# Patient Record
Sex: Female | Born: 1955 | Race: Black or African American | Hispanic: No | Marital: Married | Smoking: Former smoker
Health system: Southern US, Community
[De-identification: ages and names within clinical notes are randomized; demographics above are authoritative.]

## PROBLEM LIST (undated history)

## (undated) DIAGNOSIS — E785 Hyperlipidemia, unspecified: Secondary | ICD-10-CM

## (undated) DIAGNOSIS — D510 Vitamin B12 deficiency anemia due to intrinsic factor deficiency: Secondary | ICD-10-CM

## (undated) DIAGNOSIS — Z992 Dependence on renal dialysis: Secondary | ICD-10-CM

## (undated) DIAGNOSIS — N289 Disorder of kidney and ureter, unspecified: Secondary | ICD-10-CM

## (undated) DIAGNOSIS — E049 Nontoxic goiter, unspecified: Secondary | ICD-10-CM

## (undated) DIAGNOSIS — M8580 Other specified disorders of bone density and structure, unspecified site: Secondary | ICD-10-CM

## (undated) DIAGNOSIS — D126 Benign neoplasm of colon, unspecified: Secondary | ICD-10-CM

## (undated) DIAGNOSIS — M109 Gout, unspecified: Secondary | ICD-10-CM

## (undated) DIAGNOSIS — I10 Essential (primary) hypertension: Secondary | ICD-10-CM

## (undated) DIAGNOSIS — T8612 Kidney transplant failure: Secondary | ICD-10-CM

## (undated) DIAGNOSIS — D649 Anemia, unspecified: Secondary | ICD-10-CM

## (undated) DIAGNOSIS — N186 End stage renal disease: Secondary | ICD-10-CM

## (undated) DIAGNOSIS — J189 Pneumonia, unspecified organism: Secondary | ICD-10-CM

## (undated) DIAGNOSIS — J302 Other seasonal allergic rhinitis: Secondary | ICD-10-CM

## (undated) DIAGNOSIS — E119 Type 2 diabetes mellitus without complications: Secondary | ICD-10-CM

## (undated) DIAGNOSIS — E0590 Thyrotoxicosis, unspecified without thyrotoxic crisis or storm: Secondary | ICD-10-CM

## (undated) HISTORY — DX: Essential (primary) hypertension: I10

## (undated) HISTORY — DX: End stage renal disease: N18.6

## (undated) HISTORY — DX: Vitamin B12 deficiency anemia due to intrinsic factor deficiency: D51.0

## (undated) HISTORY — DX: Thyrotoxicosis, unspecified without thyrotoxic crisis or storm: E05.90

## (undated) HISTORY — DX: Benign neoplasm of colon, unspecified: D12.6

## (undated) HISTORY — DX: Nontoxic goiter, unspecified: E04.9

## (undated) HISTORY — DX: Other specified disorders of bone density and structure, unspecified site: M85.80

## (undated) HISTORY — DX: Hyperlipidemia, unspecified: E78.5

## (undated) HISTORY — PX: SHOULDER ARTHROSCOPY W/ ROTATOR CUFF REPAIR: SHX2400

## (undated) HISTORY — DX: Other seasonal allergic rhinitis: J30.2

## (undated) HISTORY — DX: Disorder of kidney and ureter, unspecified: N28.9

## (undated) HISTORY — PX: UPPER GI ENDOSCOPY: SHX6162

## (undated) HISTORY — DX: Gout, unspecified: M10.9

## (undated) HISTORY — DX: Type 2 diabetes mellitus without complications: E11.9

---

## 1992-06-23 HISTORY — PX: GANGLION CYST EXCISION: SHX1691

## 1998-06-27 ENCOUNTER — Encounter: Payer: Self-pay | Admitting: *Deleted

## 1998-06-27 ENCOUNTER — Ambulatory Visit (HOSPITAL_COMMUNITY): Admission: RE | Admit: 1998-06-27 | Discharge: 1998-06-27 | Payer: Self-pay | Admitting: *Deleted

## 1998-09-13 ENCOUNTER — Encounter: Payer: Self-pay | Admitting: *Deleted

## 1998-09-13 ENCOUNTER — Ambulatory Visit (HOSPITAL_COMMUNITY): Admission: RE | Admit: 1998-09-13 | Discharge: 1998-09-13 | Payer: Self-pay | Admitting: *Deleted

## 1999-10-23 ENCOUNTER — Ambulatory Visit (HOSPITAL_COMMUNITY): Admission: RE | Admit: 1999-10-23 | Discharge: 1999-10-23 | Payer: Self-pay | Admitting: *Deleted

## 1999-10-23 ENCOUNTER — Encounter: Payer: Self-pay | Admitting: *Deleted

## 1999-12-19 ENCOUNTER — Other Ambulatory Visit: Admission: RE | Admit: 1999-12-19 | Discharge: 1999-12-19 | Payer: Self-pay | Admitting: Obstetrics & Gynecology

## 2000-11-09 ENCOUNTER — Ambulatory Visit (HOSPITAL_COMMUNITY): Admission: RE | Admit: 2000-11-09 | Discharge: 2000-11-09 | Payer: Self-pay | Admitting: *Deleted

## 2000-11-09 ENCOUNTER — Encounter: Payer: Self-pay | Admitting: *Deleted

## 2001-01-13 ENCOUNTER — Other Ambulatory Visit: Admission: RE | Admit: 2001-01-13 | Discharge: 2001-01-13 | Payer: Self-pay | Admitting: Obstetrics and Gynecology

## 2001-10-20 ENCOUNTER — Encounter: Payer: Self-pay | Admitting: *Deleted

## 2001-10-20 ENCOUNTER — Ambulatory Visit (HOSPITAL_COMMUNITY): Admission: RE | Admit: 2001-10-20 | Discharge: 2001-10-20 | Payer: Self-pay | Admitting: *Deleted

## 2002-01-12 ENCOUNTER — Encounter: Payer: Self-pay | Admitting: *Deleted

## 2002-01-12 ENCOUNTER — Ambulatory Visit (HOSPITAL_COMMUNITY): Admission: RE | Admit: 2002-01-12 | Discharge: 2002-01-12 | Payer: Self-pay | Admitting: *Deleted

## 2002-01-27 ENCOUNTER — Other Ambulatory Visit: Admission: RE | Admit: 2002-01-27 | Discharge: 2002-01-27 | Payer: Self-pay | Admitting: Obstetrics and Gynecology

## 2002-02-02 ENCOUNTER — Ambulatory Visit (HOSPITAL_COMMUNITY): Admission: RE | Admit: 2002-02-02 | Discharge: 2002-02-02 | Payer: Self-pay | Admitting: Obstetrics and Gynecology

## 2002-02-02 ENCOUNTER — Encounter: Payer: Self-pay | Admitting: Obstetrics and Gynecology

## 2002-02-08 ENCOUNTER — Ambulatory Visit (HOSPITAL_COMMUNITY): Admission: RE | Admit: 2002-02-08 | Discharge: 2002-02-08 | Payer: Self-pay | Admitting: Nephrology

## 2002-02-10 ENCOUNTER — Encounter (INDEPENDENT_AMBULATORY_CARE_PROVIDER_SITE_OTHER): Payer: Self-pay | Admitting: *Deleted

## 2002-02-10 ENCOUNTER — Ambulatory Visit (HOSPITAL_COMMUNITY): Admission: RE | Admit: 2002-02-10 | Discharge: 2002-02-11 | Payer: Self-pay | Admitting: Nephrology

## 2002-02-10 ENCOUNTER — Encounter: Payer: Self-pay | Admitting: Nephrology

## 2002-09-26 ENCOUNTER — Ambulatory Visit (HOSPITAL_BASED_OUTPATIENT_CLINIC_OR_DEPARTMENT_OTHER): Admission: RE | Admit: 2002-09-26 | Discharge: 2002-09-26 | Payer: Self-pay | Admitting: Orthopedic Surgery

## 2003-01-31 ENCOUNTER — Other Ambulatory Visit: Admission: RE | Admit: 2003-01-31 | Discharge: 2003-01-31 | Payer: Self-pay | Admitting: Obstetrics and Gynecology

## 2003-02-06 ENCOUNTER — Encounter: Payer: Self-pay | Admitting: Obstetrics and Gynecology

## 2003-02-06 ENCOUNTER — Ambulatory Visit (HOSPITAL_COMMUNITY): Admission: RE | Admit: 2003-02-06 | Discharge: 2003-02-06 | Payer: Self-pay | Admitting: Obstetrics and Gynecology

## 2004-03-11 ENCOUNTER — Other Ambulatory Visit: Admission: RE | Admit: 2004-03-11 | Discharge: 2004-03-11 | Payer: Self-pay | Admitting: Obstetrics and Gynecology

## 2004-03-28 ENCOUNTER — Ambulatory Visit (HOSPITAL_COMMUNITY): Admission: RE | Admit: 2004-03-28 | Discharge: 2004-03-28 | Payer: Self-pay | Admitting: Obstetrics and Gynecology

## 2004-09-12 ENCOUNTER — Encounter: Admission: RE | Admit: 2004-09-12 | Discharge: 2004-09-12 | Payer: Self-pay | Admitting: Internal Medicine

## 2005-05-08 ENCOUNTER — Ambulatory Visit (HOSPITAL_COMMUNITY): Admission: RE | Admit: 2005-05-08 | Discharge: 2005-05-08 | Payer: Self-pay | Admitting: Internal Medicine

## 2005-06-19 ENCOUNTER — Ambulatory Visit (HOSPITAL_COMMUNITY): Admission: RE | Admit: 2005-06-19 | Discharge: 2005-06-19 | Payer: Self-pay | Admitting: Internal Medicine

## 2005-07-08 ENCOUNTER — Encounter (INDEPENDENT_AMBULATORY_CARE_PROVIDER_SITE_OTHER): Payer: Self-pay | Admitting: *Deleted

## 2005-07-08 ENCOUNTER — Encounter: Admission: RE | Admit: 2005-07-08 | Discharge: 2005-07-08 | Payer: Self-pay | Admitting: Internal Medicine

## 2005-07-08 ENCOUNTER — Other Ambulatory Visit: Admission: RE | Admit: 2005-07-08 | Discharge: 2005-07-08 | Payer: Self-pay | Admitting: Interventional Radiology

## 2005-10-29 ENCOUNTER — Encounter: Admission: RE | Admit: 2005-10-29 | Discharge: 2005-10-29 | Payer: Self-pay | Admitting: Internal Medicine

## 2005-12-19 ENCOUNTER — Encounter: Admission: RE | Admit: 2005-12-19 | Discharge: 2005-12-19 | Payer: Self-pay | Admitting: Internal Medicine

## 2006-03-24 ENCOUNTER — Ambulatory Visit: Payer: Self-pay | Admitting: Gastroenterology

## 2006-06-03 ENCOUNTER — Inpatient Hospital Stay (HOSPITAL_COMMUNITY): Admission: AD | Admit: 2006-06-03 | Discharge: 2006-06-11 | Payer: Self-pay | Admitting: Nephrology

## 2006-06-09 ENCOUNTER — Encounter (INDEPENDENT_AMBULATORY_CARE_PROVIDER_SITE_OTHER): Payer: Self-pay | Admitting: *Deleted

## 2006-06-23 HISTORY — PX: AV FISTULA PLACEMENT: SHX1204

## 2006-08-04 ENCOUNTER — Ambulatory Visit: Payer: Self-pay | Admitting: Vascular Surgery

## 2006-08-11 ENCOUNTER — Ambulatory Visit: Payer: Self-pay | Admitting: Vascular Surgery

## 2006-08-11 ENCOUNTER — Ambulatory Visit (HOSPITAL_COMMUNITY): Admission: RE | Admit: 2006-08-11 | Discharge: 2006-08-11 | Payer: Self-pay | Admitting: Vascular Surgery

## 2006-09-03 ENCOUNTER — Ambulatory Visit: Payer: Self-pay | Admitting: *Deleted

## 2006-09-24 ENCOUNTER — Ambulatory Visit: Payer: Self-pay | Admitting: *Deleted

## 2006-11-19 ENCOUNTER — Ambulatory Visit (HOSPITAL_COMMUNITY): Admission: RE | Admit: 2006-11-19 | Discharge: 2006-11-19 | Payer: Self-pay | Admitting: Vascular Surgery

## 2006-12-22 ENCOUNTER — Ambulatory Visit (HOSPITAL_COMMUNITY): Admission: RE | Admit: 2006-12-22 | Discharge: 2006-12-22 | Payer: Self-pay | Admitting: Nephrology

## 2007-04-15 ENCOUNTER — Ambulatory Visit: Payer: Self-pay | Admitting: Dentistry

## 2007-04-15 ENCOUNTER — Encounter: Admission: AD | Admit: 2007-04-15 | Discharge: 2007-04-15 | Payer: Self-pay | Admitting: Dentistry

## 2007-08-19 ENCOUNTER — Ambulatory Visit: Payer: Self-pay | Admitting: Gastroenterology

## 2007-08-22 DIAGNOSIS — D126 Benign neoplasm of colon, unspecified: Secondary | ICD-10-CM

## 2007-08-22 HISTORY — DX: Benign neoplasm of colon, unspecified: D12.6

## 2007-09-02 ENCOUNTER — Encounter: Payer: Self-pay | Admitting: Gastroenterology

## 2007-09-02 ENCOUNTER — Ambulatory Visit (HOSPITAL_COMMUNITY): Admission: RE | Admit: 2007-09-02 | Discharge: 2007-09-02 | Payer: Self-pay | Admitting: Gastroenterology

## 2007-09-07 ENCOUNTER — Ambulatory Visit: Payer: Self-pay | Admitting: Gastroenterology

## 2007-09-21 ENCOUNTER — Encounter (INDEPENDENT_AMBULATORY_CARE_PROVIDER_SITE_OTHER): Payer: Self-pay | Admitting: Ophthalmology

## 2007-09-21 ENCOUNTER — Ambulatory Visit (HOSPITAL_BASED_OUTPATIENT_CLINIC_OR_DEPARTMENT_OTHER): Admission: RE | Admit: 2007-09-21 | Discharge: 2007-09-21 | Payer: Self-pay | Admitting: Ophthalmology

## 2008-11-21 HISTORY — PX: KIDNEY TRANSPLANT: SHX239

## 2009-07-12 DIAGNOSIS — E559 Vitamin D deficiency, unspecified: Secondary | ICD-10-CM | POA: Insufficient documentation

## 2010-01-29 ENCOUNTER — Ambulatory Visit: Payer: Self-pay | Admitting: Vascular Surgery

## 2010-07-14 ENCOUNTER — Encounter: Payer: Self-pay | Admitting: Internal Medicine

## 2010-11-05 NOTE — Assessment & Plan Note (Signed)
OFFICE VISIT   Kimberly [name], Kimberly [name]  DOB:  06/16/1956                                       01/29/2010  CHART#:[id number]   This is a patient with chronic renal insufficiency, was on hemodialysis  for 2 years with a left upper arm AV fistula.  We have seen her in the  past regarding this fistula.  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  Prior to that, she had a large area of infiltration with a  hematoma surrounding the left upper arm fistula.  The fistula has not  been utilized in over a year but she has continued to have some bluish  discoloration where the hematoma was located.  There is not really any  pain associated with this and she has no steal symptoms in the left  hand, denying any numbness or pain.  Cadaveric kidney transplant is  functioning well.   CHRONIC MEDICAL PROBLEMS:  1. Hypertension.  2. Chronic renal insufficiency status post renal transplant.  3. Negative for coronary artery disease, diabetes or stroke.  4.   SOCIAL HISTORY:  She is married, has 3 children.  Does not use tobacco,  has not in 10 years.  Does not use alcohol.   REVIEW OF SYSTEMS:  Negative for chest pain, dyspnea on exertion, no  chronic bronchitis, productive cough, asthma, wheezing.  All other  systems are negative in review of systems.   PHYSICAL EXAMINATION:  Blood pressure 112/69, heart rate 67, temperature  97.8, respirations 14.  General:  A well-developed, well-nourished  female in no apparent distress, alert and oriented x3.  Chest:  Clear to  auscultation.  Upper extremity exam reveals a left arm to have a  functioning left upper arm fistula.  There is some depigmentation  overlying the fistula.  Has an excellent pulse and palpable thrill and  is dilated to about 2.5 cm throughout.  There is some mild bruising  surrounding this in the mid to proximal upper arm but no hematoma.  She  has excellent circulation distally with a pink, well-perfused left  hand.   I think the bruising has still residual from the large hematoma which  she experienced a year ago.  There is no indication to ligate the  fistula or revise it.  If this enlarges, develops aneurysm, she will be  in touch with [place].  Otherwise continue as she has been doing unless it  becomes symptomatic.     Nelda Severe Kellie Simmering, M.D.  Electronically Signed   JDL/MEDQ  D:  01/29/2010  T:  01/30/2010  Job:  [id number]

## 2010-11-05 NOTE — Assessment & Plan Note (Signed)
[facility]                         GASTROENTEROLOGY OFFICE NOTE   Kimberly, [name]                      MRN:          [id number]  DATE:08/19/2007                            DOB:          30-May-1956    REFERRING PHYSICIAN:  Janalyn Rouse, M.D.   REASON FOR CONSULTATION:  Anemia and constipation.   HISTORY OF PRESENT ILLNESS:  Kimberly [name] is a 55 year old African-  American female with end-stage renal disease on hemodialysis on Monday,  Wednesday, Friday for approximately the past 14 months.  She has a mild  macrocytic anemia, hemoglobin 11.9, MCV 98.5.  She has occaisonal  constipation. She relates no change in bowel habits, change in stool  caliber, diarrhea, melena, hematochezia, or weight loss.  There is no  family history of colon cancer, colon polyps, or inflammatory bowel  disease.   PAST MEDICAL HISTORY:  hypertension  diabetes mellitus  hyperlipidemia  goiter  membranous glomerulonephropathy with chronic renal failure  osteopenia  gout   PAST SURGICAL HISTORY:  Status post laparoscopic left shoulder surgery   CURRENT MEDICATIONS:  Listed on the chart, updated, and reviewed.   MEDICATION ALLERGIES:  None known.   SOCIAL HISTORY AND REVIEW OF SYSTEMS:  Per the hand-written form.   PHYSICAL EXAM:  Well-developed, well-nourished in no acute distress.  Height 5 feet 6 inches, weight 189 pounds, blood pressure 100/68, pulse  72 and regular.  HEENT:  Anicteric sclerae.  Oropharynx clear.  CHEST:  Clear to auscultation bilaterally.  CARDIAC:  Regular rate and rhythm without murmurs appreciated.  ABDOMEN:  Soft, nontender, nondistended.  Normoactive bowel sounds.  No  palpable organomegaly, masses, or hernias.  RECTAL:  Deferred to time of colonoscopy.  EXTREMITIES:  Without cyanosis, clubbing, or edema.  NEUROLOGIC:  Alert and oriented x3.  Grossly nonfocal.   ASSESSMENT AND PLAN:  Mild macrocytic anemia and mild constipation.  Rule out colorectal neoplasms, arteriovenous malformations.  Risks,  benefits, and alternatives to colonoscopy with possible biopsy and  possible polypectomy discussed with the patient.  She consents to  proceed.  This will be scheduled on a Tuesday or Thursday to accommodate her  routine hemodialysis schedule.     Pricilla Riffle. Fuller Plan, MD, [facility]  Electronically Signed    MTS/MedQ  DD: 08/27/2007  DT: 08/27/2007  Job #: [id number]   cc:   Janalyn Rouse, M.D.

## 2010-11-08 NOTE — Discharge Summary (Signed)
NAMELAKEN, Kimberly [name]               ACCOUNT NO.:  [id number]   MEDICAL RECORD NO.:  [id number]          PATIENT TYPE:  INP   LOCATION:  [id number]                         FACILITY:  [facility]   PHYSICIAN:  Louis Meckel, M.D.DATE OF BIRTH:  [birth year]   DATE OF ADMISSION:  06/03/2006  DATE OF DISCHARGE:  06/11/2006                               DISCHARGE SUMMARY   DISCHARGE DIAGNOSES:  1. Uremia - new end-stage renal disease.  2. Anemia.  3. Hypertension.  4. Secondary hyperparathyroidism.  5. Uremic platelet dysfunction.  6. Steroid-induced diabetes mellitus.  7. Leukocytosis.   PROCEDURES:  1. Right IJ dialysis catheter placement on June 04, 2006 by Dr.      Annamaria Boots.  2. Hemodialysis.  3. Renal biopsy, June 09, 2006, Dr. Moshe Cipro.   HISTORY OF PRESENT ILLNESS:  Kimberly [name] is a 55 year old African  American female with a past medical history significant for hypertension  and biopsy-proven membranous glomerulosclerosis in August 2003 in  association with FSG.  The patient was followed and managed  conservatively initially until 2005, when she developed more significant  hypertension, and her creatinine increased to 1.8.  The patient is  status post courses x2 of prednisone and Cytoxan, with Cytoxan stopped  in April 2007 and prednisone stopped in August 2007. The patient  appeared to be doing well at her last office visit on April 13, 2006,  when her creatinine was in the high 2s.  On June 01, 2006, the  office received a call from the patient, vomiting over the weekend week,  with questionable blood in her urine. Her labs showed creatinine now up  to 12.  Her urine was hematuria, and her hemoglobin was 8, down from 12.  The patient was called at home, and her son said she was feeling better  today. Her blood pressure at home was 188/90.  The patient admits to  dark stools. Dr. Moshe Cipro recommended inpatient evaluation, and she  agreed. The patient was  admitted then on June 03, 2006.  In the  hospital, she was evaluated by Dr. Jamal Maes. Further review of  systems noted that she has been feeling unwell for the past two weeks,  with chills and flu-like symptoms and cough, more epigastric pain,  nausea and vomiting, and a taste in mouth, with tea-colored urine. Her  blood pressure at presentation was 165/89, she was afebrile, heart rate  107, and 99% saturation on room air.   HOSPITAL COURSE:  The patient was admitted to a medical floor.  Initial  labs drawn showed a white count of 9.8, with 81% neutrophils, 10%  lymphocytes, hemoglobin 7.9, hematocrit 22.8, platelets 250,000.  PTT  53, INR 1.1.  Sodium 139, potassium 6.1, chloride 112, CO2 13, glucose  173, BUN 85, creatinine 12.3, calcium 9.5, albumin 3.1, phosphorus 9.4.  Liver function tests within normal limits.  C3 and C4 were checked and  found to be within acceptable range.  ANA was negative, as was ANCA.  Glomerular basement membrane was 0.  Initial bleeding time done was  elevated, most likely due to uremic platelet dysfunction.  Her initial  elevated potassium was treated with Kayexalate.  After 24 hours, it was  apparent she would need to start dialysis, which she did on June 04, 2006, following placement of permanent dialysis catheter. She had serial  treatments throughout her hospitalization, gradually lowering her  weight, tapering her blood pressure medications. A repeat of her  bleeding time after several dialysis treatments showed a bleeding time  of 4, and therefore she was taken for biopsy again on June 09, 2006  to see if there is something that could be reversed. She had been  empirically put on Solu-Medrol 40 mg b.i.d. upon admission to the  hospital. Final biopsy showed membranous GN as well as scar tissue.  Therefore, it was decided that she would most likely need to be on long-  term hemodialysis, and arrangements were made for her to start at  the  [facility] this Saturday. She is very interested in  transplant workup and she has several brothers and sisters. She has a  fraternal twin brother. Her sister and another brother have diabetes,  which may prohibit them from offering a kidney root.  The patient did  receive 1 unit of packed red cells in her hospitalization which  increased her hemoglobin somewhat. It was 9.4 at discharge.  Iron  studies were checked on June 04, 2006, and she had a percent  saturation of 30.  She will be discharged on low-dose InFeD daily as  well as Epogen, having received Aranesp during her hospitalization.  Also during her hospitalization, it was noted that her white blood count  steadily increased, and she was afebrile.  This was most likely due to  her Solu-Medrol, which was changed to prednisone taper at the time of  discharge.  She will be tapered starting at 60 mg per day, but  decreasing by 10 mg every 5 days, and the taper should last  approximately 30 days.  The patient's blood sugar was more elevated  during her hospitalization.  Again, this is likely due to Solu-Medrol.  She does have an Accu-Chek machine at home.  I have instructed her on  house to follow the steroid taper and encouraged her to check her Accu-  Cheks, as her need for glipizide may change once she is off of the  prednisone.  The patient was on oral Hectorol prior to admission.  Her  PTH this admission was 324. She was also started on Renagel for her  elevated phosphorus. The patient had a problem with constipation during  her hospitalization that was corrected with sorbitol. She also received  diet teaching by the dietician just prior to discharge, assistance with  financial aid, and case management support throughout her  hospitalization. She was discharged home the afternoon of June 11, 2006.   CONDITION ON DISCHARGE:  Good.  DISCHARGE MEDICATIONS:  1. Vytorin 10/40, 1 daily.  2. Glipizide 5  mg twice a day.  3. Allopurinol 100 mg daily.  4. Clonidine 0.1 mg 3 times a day.  Hold before dialysis Tuesday,      Thursday, and Saturday.  5. Nephro-Vite 1 daily, Renagel 800 mg 3 with meals 3 times a day.  6. Labetalol 200 mg b.i.d.  Hold before dialysis Tuesday, Thursday,      and Saturday.  7. Prednisone taper 60 mg per day x 5, 50 mg per day x 5, 40 mg per      day x 5, 20 mg per day x  5, 10 mg per day x5, then 5 mg per day x      five, then stop.  8. She was advised to stop Norvasc, Hectorol, Lasix. and Avapro.   She is to go to her dialysis schedule on Saturday at the [facility]. New dialysis orders were faxed to their center, which  included dosing for Epogen and Hectorol.   DISCHARGE DIET:  Renal, no concentrated sweets.  She is advised not to  take showers due to her dialysis catheter. Dialysis center will change  her dressing. Dialysis center will also make appointments for CVTS as  referral for AV fistula placement, and to the [facility] for transplant evaluation.   ADDITIONAL LABORATORIES DONE IN THE HOSPITALIZATION:  Hepatitis B  surface antigen was negative on June 04, 2006.  ESR was elevated to  124 on June 03, 2006.  Last labs prior to discharge on June 09, 2006 showed creatinine 8.2, BUN 69, potassium 4.4, sodium 134, chloride  96, CO2 21, glucose 203, calcium 9.5, albumin 3.1, phosphorus 6.5. White  count 18.8, hemoglobin 9.4, hematocrit 26.8, platelets 281,000.      Alric Seton, P.A.    ______________________________  Louis Meckel, M.D.    MB/MEDQ  D:  06/11/2006  T:  06/12/2006  Job:  [id number]   cc:   [facility]

## 2010-11-08 NOTE — Op Note (Signed)
Kimberly [name], Kimberly [name]               ACCOUNT NO.:  [id number]   MEDICAL RECORD NO.:  [id number]          PATIENT TYPE:  AMB   LOCATION:  SDS                          FACILITY:  [facility]   PHYSICIAN:  Judeth Cornfield. Scot Dock, M.D.DATE OF BIRTH:  03-02-1956   DATE OF PROCEDURE:  08/11/2006  DATE OF DISCHARGE:                               OPERATIVE REPORT   PREOPERATIVE DIAGNOSIS:  Chronic renal failure.   POSTOPERATIVE DIAGNOSIS:  Chronic renal failure.   PROCEDURE:  Placement of left upper arm AV fistula.   SURGEON:  Judeth Cornfield. Scot Dock, M.D.   ASSISTANT:  Doroteo Bradford, PA   ANESTHESIA:  Local with sedation.   TECHNIQUE:  The patient was taken to the operating room and sedated by  anesthesia.  The left upper extremity was prepped and draped in usual  sterile fashion.  After the skin was infiltrated with 1% lidocaine, a  transverse incision was made just above the antecubital level.  Here the  brachial artery was dissected free, had a good pulse, was somewhat  small.  The cephalic vein was dissected free, was a good-sized vein and  I dissected down to a branch and ligated both branches allowing me to  spatulate between the two to allow for nice wide arterial anastomosis.  The vein irrigated nicely with heparinized saline.  The patient was then  heparinized.  The brachial artery was clamped proximally and distally  and a longitudinal arteriotomy was made.  The vein was mobilized over  and sewn end-to-side to the artery using continuous 6-0 Prolene suture.  At completion there was an excellent thrill in the fistula and a  palpable radial pulse.  Hemostasis was obtained in the wound.  The wound  was closed with deep layer of 3-0 Vicryl.  The skin closed with 4-0  Vicryl.  Sterile dressing was applied.  The patient tolerated procedure  well, was transferred to recovery room in satisfactory condition.  All  needle and sponge counts were correct.      Judeth Cornfield. Scot Dock,  M.D.  Electronically Signed     CSD/MEDQ  D:  08/11/2006  T:  08/11/2006  Job:  [id number]

## 2010-11-08 NOTE — Op Note (Signed)
NAME:  Kimberly [name], Kimberly [name]                         ACCOUNT NO.:  [id number]   MEDICAL RECORD NO.:  [id number]                   PATIENT TYPE:  AMB   LOCATION:  [place]                                  FACILITY:  [facility]   PHYSICIAN:  Robert A. Noemi Chapel, M.D.              DATE OF BIRTH:  June 12, 1956   DATE OF PROCEDURE:  09/26/2002  DATE OF DISCHARGE:                                 OPERATIVE REPORT   PREOPERATIVE DIAGNOSIS:  Right shoulder partial rotator cuff tear with cuff  tear with adhesive capsulitis and impingement.   POSTOPERATIVE DIAGNOSIS:  Right shoulder partial rotator cuff tear with cuff  tear with adhesive capsulitis and impingement.   OPERATION PERFORMED:  1. Right shoulder examination under anesthesia followed by manipulation.  2. Right shoulder arthroscopy with partial rotator cuff tear debridement and     subacromial decompression.   SURGEON:  Audree Camel. Noemi Chapel, M.D.   ASSISTANT:  Matthew Saras, P.A.   ANESTHESIA:  General.   OPERATIVE TIME:  45 minutes.   COMPLICATIONS:  None.   INDICATIONS FOR PROCEDURE:  The patient is a 55 year old woman who has had  significant right shoulder pain for the six to eight months increasing in  nature with signs and symptoms and MRI documented partial rotator cuff tear  with impingement and adhesive capsulitis, who has failed conservative care,  who is now to undergo arthroscopy.   DESCRIPTION OF PROCEDURE:  Kimberly [name] was brought to the operating room on  09/26/2002 after an interscalene block had been placed by anesthesia in the  holding room.  She was placed on the operating table in supine position.  After being placed under general anesthesia, her right shoulder was examined  under anesthesia.  Initial range of motion, forward flexion of 140,  abduction 120, internal-external rotation of 40 to 50 degrees.  A gentle  manipulation was carried out breaking up soft adhesions and improving  forward flexion to 175, abduction to  175, internal and external rotation to  90 degrees.  The shoulder remained stable to ligamentous exam.  She was then  placed in beach chair position and her shoulder and arm were prepped  using  sterile DuraPrep and draped using sterile technique.  Originally, through a  posterior arthroscopic portal, the arthroscope with a pump attached was  placed and through an anterior portal, an arthroscopic probe was placed.  On  initial inspection, the articular cartilage in the glenohumeral joint was  intact, anterior labrum intact.  Anterior inferior labrum and anterior  inferior glenohumeral ligament complex was intact.  Superior labrum, biceps  tendon anchor was intact and the biceps tendon was intact.  The posterior  labrum was intact.  Inferior capsule recess  showed significant hemorrhagic  synovitis secondary to manipulation but no other pathology was noted.  After  this was done, the rotator cuff was inspected.  She had a partial tear 30 to  40%  of the undersurface of the supraspinatus which was debrided.  The rest  of the rotator cuff was intact.  The subacromial space was entered and a  lateral arthroscopic portal was made.  Moderately thickened bursitis was  resected.  Underneath this, the rotator cuff was somewhat frayed and  inflamed but no evidence of any significant tearing.  Subacromial  decompression was carried out removing 6 to 8 mm of the undersurface of the  anterior, anterolateral and anteromedial acromion and CA ligament released  carried out as well.  The [facility] joint was not disturbed.  After this was done,  it was felt that all pathology had been satisfactorily addressed.  The  instruments were removed.  Portals were closed with 3-0 nylon suture and  injected with 0.25% Marcaine with epinephrine.  Sterile dressings and a  sling applied.  Patient awakened and taken to recovery room in stable  condition.   FOLLOW UP:  The patient will be followed as an outpatient on Vicodin  and  Naprosyn with early aggressive physical therapy.  See her back in the office  in a week for sutures out and follow-up.                                                Robert A. Noemi Chapel, M.D.    RAW/MEDQ  D:  09/26/2002  T:  09/26/2002  Job:  [id number]

## 2010-11-08 NOTE — Op Note (Signed)
Kimberly [name], Kimberly [name] NO.:  [id number]   MEDICAL RECORD NO.:  [id number]          PATIENT TYPE:  AMB   LOCATION:  [place]                          FACILITY:  [facility]   PHYSICIAN:  Garlan Fair., M.D.DATE OF BIRTH:  02-Jul-1955   DATE OF PROCEDURE:  09/23/2007  DATE OF DISCHARGE:  09/21/2007                               OPERATIVE REPORT   PREOPERATIVE DIAGNOSIS:  Irritated papilloma upper lid, right eye.   POSTOPERATIVE DIAGNOSIS:  Irritated papilloma upper lid, right eye.   OPERATION:  Excision papilloma upper lid, right eye.   ANESTHESIA:  Local using Xylocaine 1%.   PROCEDURE:  The upper lid of the right eye was inspected, and there were  multiple small papillomatous lesions located in a cluster along the  central aspect of the upper lid of the right eye.  A curvilinear was  made around the entire cluster of lesions.  Then, using sharp and blunt  dissection, this cluster of papilloma there were excised using sharp and  blunt dissection.  The edges of the wound were then reapproximated with  multiple sutures of 6-0 silk.  Polysporin ophthalmic ointment, and a  pressure patch was applied.  The patient tolerated the procedure well  and was discharged to the post-anesthesia recovery satisfactory  condition.  She is instructed to keep the eye patched today, to take  Tylenol every 4 hours as needed for pain, to remove the patch tomorrow,  and to see me in 1 week for further evaluation.   DISCHARGE DIAGNOSES:  Irritative papillomata upper lid, right eye.      Garlan Fair., M.D.  Electronically Signed     TB/MEDQ  D:  09/23/2007  T:  09/23/2007  Job:  [id number]

## 2011-06-26 DIAGNOSIS — E785 Hyperlipidemia, unspecified: Secondary | ICD-10-CM | POA: Diagnosis not present

## 2011-06-26 DIAGNOSIS — E119 Type 2 diabetes mellitus without complications: Secondary | ICD-10-CM | POA: Diagnosis not present

## 2011-06-26 DIAGNOSIS — Z940 Kidney transplant status: Secondary | ICD-10-CM | POA: Diagnosis not present

## 2011-06-26 DIAGNOSIS — Z79899 Other long term (current) drug therapy: Secondary | ICD-10-CM | POA: Diagnosis not present

## 2011-08-05 DIAGNOSIS — Z780 Asymptomatic menopausal state: Secondary | ICD-10-CM | POA: Diagnosis not present

## 2011-09-10 DIAGNOSIS — N182 Chronic kidney disease, stage 2 (mild): Secondary | ICD-10-CM | POA: Diagnosis not present

## 2011-09-10 DIAGNOSIS — E119 Type 2 diabetes mellitus without complications: Secondary | ICD-10-CM | POA: Diagnosis not present

## 2011-09-10 DIAGNOSIS — I129 Hypertensive chronic kidney disease with stage 1 through stage 4 chronic kidney disease, or unspecified chronic kidney disease: Secondary | ICD-10-CM | POA: Diagnosis not present

## 2011-09-10 DIAGNOSIS — Z940 Kidney transplant status: Secondary | ICD-10-CM | POA: Diagnosis not present

## 2011-09-17 DIAGNOSIS — Z79899 Other long term (current) drug therapy: Secondary | ICD-10-CM | POA: Diagnosis not present

## 2011-09-17 DIAGNOSIS — Z940 Kidney transplant status: Secondary | ICD-10-CM | POA: Diagnosis not present

## 2011-09-17 DIAGNOSIS — E119 Type 2 diabetes mellitus without complications: Secondary | ICD-10-CM | POA: Diagnosis not present

## 2011-09-17 DIAGNOSIS — E785 Hyperlipidemia, unspecified: Secondary | ICD-10-CM | POA: Diagnosis not present

## 2011-09-24 DIAGNOSIS — E119 Type 2 diabetes mellitus without complications: Secondary | ICD-10-CM | POA: Diagnosis not present

## 2011-09-24 DIAGNOSIS — M109 Gout, unspecified: Secondary | ICD-10-CM | POA: Diagnosis not present

## 2011-09-24 DIAGNOSIS — M899 Disorder of bone, unspecified: Secondary | ICD-10-CM | POA: Diagnosis not present

## 2011-09-24 DIAGNOSIS — R82998 Other abnormal findings in urine: Secondary | ICD-10-CM | POA: Diagnosis not present

## 2011-09-24 DIAGNOSIS — E785 Hyperlipidemia, unspecified: Secondary | ICD-10-CM | POA: Diagnosis not present

## 2011-09-24 DIAGNOSIS — M949 Disorder of cartilage, unspecified: Secondary | ICD-10-CM | POA: Diagnosis not present

## 2011-09-24 DIAGNOSIS — I10 Essential (primary) hypertension: Secondary | ICD-10-CM | POA: Diagnosis not present

## 2011-09-24 DIAGNOSIS — E559 Vitamin D deficiency, unspecified: Secondary | ICD-10-CM | POA: Diagnosis not present

## 2011-10-01 DIAGNOSIS — E785 Hyperlipidemia, unspecified: Secondary | ICD-10-CM | POA: Diagnosis not present

## 2011-10-01 DIAGNOSIS — E119 Type 2 diabetes mellitus without complications: Secondary | ICD-10-CM | POA: Diagnosis not present

## 2011-10-01 DIAGNOSIS — I10 Essential (primary) hypertension: Secondary | ICD-10-CM | POA: Diagnosis not present

## 2011-10-01 DIAGNOSIS — Z0000 Encounter for general adult medical examination without abnormal findings: Secondary | ICD-10-CM | POA: Diagnosis not present

## 2011-12-03 DIAGNOSIS — I10 Essential (primary) hypertension: Secondary | ICD-10-CM | POA: Diagnosis not present

## 2011-12-03 DIAGNOSIS — Z940 Kidney transplant status: Secondary | ICD-10-CM | POA: Diagnosis not present

## 2011-12-03 DIAGNOSIS — D649 Anemia, unspecified: Secondary | ICD-10-CM | POA: Diagnosis not present

## 2011-12-03 DIAGNOSIS — Z79899 Other long term (current) drug therapy: Secondary | ICD-10-CM | POA: Diagnosis not present

## 2011-12-04 DIAGNOSIS — E119 Type 2 diabetes mellitus without complications: Secondary | ICD-10-CM | POA: Diagnosis not present

## 2011-12-04 DIAGNOSIS — Z79899 Other long term (current) drug therapy: Secondary | ICD-10-CM | POA: Diagnosis not present

## 2011-12-04 DIAGNOSIS — E785 Hyperlipidemia, unspecified: Secondary | ICD-10-CM | POA: Diagnosis not present

## 2011-12-04 DIAGNOSIS — Z940 Kidney transplant status: Secondary | ICD-10-CM | POA: Diagnosis not present

## 2012-01-16 DIAGNOSIS — Z124 Encounter for screening for malignant neoplasm of cervix: Secondary | ICD-10-CM | POA: Diagnosis not present

## 2012-01-16 DIAGNOSIS — Z1231 Encounter for screening mammogram for malignant neoplasm of breast: Secondary | ICD-10-CM | POA: Diagnosis not present

## 2012-02-09 DIAGNOSIS — I10 Essential (primary) hypertension: Secondary | ICD-10-CM | POA: Diagnosis not present

## 2012-02-09 DIAGNOSIS — E119 Type 2 diabetes mellitus without complications: Secondary | ICD-10-CM | POA: Diagnosis not present

## 2012-02-09 DIAGNOSIS — N186 End stage renal disease: Secondary | ICD-10-CM | POA: Diagnosis not present

## 2012-02-09 DIAGNOSIS — E785 Hyperlipidemia, unspecified: Secondary | ICD-10-CM | POA: Diagnosis not present

## 2012-02-16 DIAGNOSIS — Z940 Kidney transplant status: Secondary | ICD-10-CM | POA: Diagnosis not present

## 2012-02-16 DIAGNOSIS — E119 Type 2 diabetes mellitus without complications: Secondary | ICD-10-CM | POA: Diagnosis not present

## 2012-02-16 DIAGNOSIS — E785 Hyperlipidemia, unspecified: Secondary | ICD-10-CM | POA: Diagnosis not present

## 2012-02-16 DIAGNOSIS — Z79899 Other long term (current) drug therapy: Secondary | ICD-10-CM | POA: Diagnosis not present

## 2012-02-17 DIAGNOSIS — H5200 Hypermetropia, unspecified eye: Secondary | ICD-10-CM | POA: Diagnosis not present

## 2012-02-17 DIAGNOSIS — H524 Presbyopia: Secondary | ICD-10-CM | POA: Diagnosis not present

## 2012-02-17 DIAGNOSIS — E119 Type 2 diabetes mellitus without complications: Secondary | ICD-10-CM | POA: Diagnosis not present

## 2012-02-17 DIAGNOSIS — H52229 Regular astigmatism, unspecified eye: Secondary | ICD-10-CM | POA: Diagnosis not present

## 2012-03-23 DIAGNOSIS — Z23 Encounter for immunization: Secondary | ICD-10-CM | POA: Diagnosis not present

## 2012-03-23 DIAGNOSIS — Z79899 Other long term (current) drug therapy: Secondary | ICD-10-CM | POA: Diagnosis not present

## 2012-03-23 DIAGNOSIS — D649 Anemia, unspecified: Secondary | ICD-10-CM | POA: Diagnosis not present

## 2012-03-23 DIAGNOSIS — Z940 Kidney transplant status: Secondary | ICD-10-CM | POA: Diagnosis not present

## 2012-03-23 DIAGNOSIS — I10 Essential (primary) hypertension: Secondary | ICD-10-CM | POA: Diagnosis not present

## 2012-04-07 DIAGNOSIS — E119 Type 2 diabetes mellitus without complications: Secondary | ICD-10-CM | POA: Diagnosis not present

## 2012-04-07 DIAGNOSIS — Z79899 Other long term (current) drug therapy: Secondary | ICD-10-CM | POA: Diagnosis not present

## 2012-04-07 DIAGNOSIS — Z940 Kidney transplant status: Secondary | ICD-10-CM | POA: Diagnosis not present

## 2012-04-07 DIAGNOSIS — E785 Hyperlipidemia, unspecified: Secondary | ICD-10-CM | POA: Diagnosis not present

## 2012-05-31 DIAGNOSIS — E119 Type 2 diabetes mellitus without complications: Secondary | ICD-10-CM | POA: Diagnosis not present

## 2012-05-31 DIAGNOSIS — E785 Hyperlipidemia, unspecified: Secondary | ICD-10-CM | POA: Diagnosis not present

## 2012-05-31 DIAGNOSIS — N186 End stage renal disease: Secondary | ICD-10-CM | POA: Diagnosis not present

## 2012-05-31 DIAGNOSIS — I10 Essential (primary) hypertension: Secondary | ICD-10-CM | POA: Diagnosis not present

## 2012-07-05 DIAGNOSIS — D649 Anemia, unspecified: Secondary | ICD-10-CM | POA: Diagnosis not present

## 2012-07-05 DIAGNOSIS — E119 Type 2 diabetes mellitus without complications: Secondary | ICD-10-CM | POA: Diagnosis not present

## 2012-07-05 DIAGNOSIS — Z940 Kidney transplant status: Secondary | ICD-10-CM | POA: Diagnosis not present

## 2012-07-05 DIAGNOSIS — I10 Essential (primary) hypertension: Secondary | ICD-10-CM | POA: Diagnosis not present

## 2012-07-08 DIAGNOSIS — Z940 Kidney transplant status: Secondary | ICD-10-CM | POA: Diagnosis not present

## 2012-07-08 DIAGNOSIS — E785 Hyperlipidemia, unspecified: Secondary | ICD-10-CM | POA: Diagnosis not present

## 2012-07-08 DIAGNOSIS — Z79899 Other long term (current) drug therapy: Secondary | ICD-10-CM | POA: Diagnosis not present

## 2012-07-08 DIAGNOSIS — E119 Type 2 diabetes mellitus without complications: Secondary | ICD-10-CM | POA: Diagnosis not present

## 2012-08-20 ENCOUNTER — Encounter: Payer: Self-pay | Admitting: Gastroenterology

## 2012-10-04 DIAGNOSIS — Z940 Kidney transplant status: Secondary | ICD-10-CM | POA: Diagnosis not present

## 2012-10-04 DIAGNOSIS — Z79899 Other long term (current) drug therapy: Secondary | ICD-10-CM | POA: Diagnosis not present

## 2012-10-04 DIAGNOSIS — E119 Type 2 diabetes mellitus without complications: Secondary | ICD-10-CM | POA: Diagnosis not present

## 2012-10-04 DIAGNOSIS — I10 Essential (primary) hypertension: Secondary | ICD-10-CM | POA: Diagnosis not present

## 2012-10-19 DIAGNOSIS — Z940 Kidney transplant status: Secondary | ICD-10-CM | POA: Diagnosis not present

## 2012-10-19 DIAGNOSIS — E119 Type 2 diabetes mellitus without complications: Secondary | ICD-10-CM | POA: Diagnosis not present

## 2012-10-19 DIAGNOSIS — E785 Hyperlipidemia, unspecified: Secondary | ICD-10-CM | POA: Diagnosis not present

## 2012-10-19 DIAGNOSIS — Z79899 Other long term (current) drug therapy: Secondary | ICD-10-CM | POA: Diagnosis not present

## 2012-10-21 DIAGNOSIS — E049 Nontoxic goiter, unspecified: Secondary | ICD-10-CM | POA: Diagnosis not present

## 2012-10-21 DIAGNOSIS — I10 Essential (primary) hypertension: Secondary | ICD-10-CM | POA: Diagnosis not present

## 2012-10-21 DIAGNOSIS — M109 Gout, unspecified: Secondary | ICD-10-CM | POA: Diagnosis not present

## 2012-10-21 DIAGNOSIS — E559 Vitamin D deficiency, unspecified: Secondary | ICD-10-CM | POA: Diagnosis not present

## 2012-10-21 DIAGNOSIS — E119 Type 2 diabetes mellitus without complications: Secondary | ICD-10-CM | POA: Diagnosis not present

## 2012-10-27 ENCOUNTER — Encounter: Payer: Self-pay | Admitting: Gastroenterology

## 2012-10-27 DIAGNOSIS — M109 Gout, unspecified: Secondary | ICD-10-CM | POA: Diagnosis not present

## 2012-10-27 DIAGNOSIS — D899 Disorder involving the immune mechanism, unspecified: Secondary | ICD-10-CM | POA: Diagnosis not present

## 2012-10-27 DIAGNOSIS — E1129 Type 2 diabetes mellitus with other diabetic kidney complication: Secondary | ICD-10-CM | POA: Diagnosis not present

## 2012-10-27 DIAGNOSIS — Z940 Kidney transplant status: Secondary | ICD-10-CM | POA: Diagnosis not present

## 2012-10-27 DIAGNOSIS — I10 Essential (primary) hypertension: Secondary | ICD-10-CM | POA: Diagnosis not present

## 2012-10-27 DIAGNOSIS — N186 End stage renal disease: Secondary | ICD-10-CM | POA: Diagnosis not present

## 2012-10-27 DIAGNOSIS — E785 Hyperlipidemia, unspecified: Secondary | ICD-10-CM | POA: Diagnosis not present

## 2012-10-27 DIAGNOSIS — Z0000 Encounter for general adult medical examination without abnormal findings: Secondary | ICD-10-CM | POA: Diagnosis not present

## 2012-11-11 DIAGNOSIS — E119 Type 2 diabetes mellitus without complications: Secondary | ICD-10-CM | POA: Diagnosis not present

## 2012-11-11 DIAGNOSIS — Z79899 Other long term (current) drug therapy: Secondary | ICD-10-CM | POA: Diagnosis not present

## 2012-11-11 DIAGNOSIS — E785 Hyperlipidemia, unspecified: Secondary | ICD-10-CM | POA: Diagnosis not present

## 2012-11-11 DIAGNOSIS — Z940 Kidney transplant status: Secondary | ICD-10-CM | POA: Diagnosis not present

## 2012-11-22 DIAGNOSIS — R599 Enlarged lymph nodes, unspecified: Secondary | ICD-10-CM | POA: Diagnosis not present

## 2012-11-22 DIAGNOSIS — D899 Disorder involving the immune mechanism, unspecified: Secondary | ICD-10-CM | POA: Diagnosis not present

## 2012-12-01 ENCOUNTER — Encounter: Payer: Self-pay | Admitting: Cardiovascular Disease

## 2012-12-07 ENCOUNTER — Encounter: Payer: Self-pay | Admitting: Gastroenterology

## 2012-12-07 ENCOUNTER — Ambulatory Visit (AMBULATORY_SURGERY_CENTER): Payer: Medicare Other | Admitting: *Deleted

## 2012-12-07 VITALS — Ht 66.0 in | Wt 168.6 lb

## 2012-12-07 DIAGNOSIS — Z800 Family history of malignant neoplasm of digestive organs: Secondary | ICD-10-CM

## 2012-12-07 DIAGNOSIS — Z860100 Personal history of colon polyps, unspecified: Secondary | ICD-10-CM

## 2012-12-07 DIAGNOSIS — Z1211 Encounter for screening for malignant neoplasm of colon: Secondary | ICD-10-CM

## 2012-12-07 MED ORDER — MOVIPREP 100 G PO SOLR: 1.0000 | Freq: Once | ORAL | Status: DC

## 2012-12-07 NOTE — Progress Notes (Signed)
Denies allergies to eggs or soy products. Denies complications with anesthesia or sedation. 

## 2013-01-04 ENCOUNTER — Ambulatory Visit (AMBULATORY_SURGERY_CENTER): Payer: Medicare Other | Admitting: Gastroenterology

## 2013-01-04 ENCOUNTER — Encounter: Payer: Self-pay | Admitting: Gastroenterology

## 2013-01-04 VITALS — BP 159/70 | HR 60 | Temp 97.4°F | Resp 17 | Ht 66.0 in | Wt 168.0 lb

## 2013-01-04 DIAGNOSIS — E119 Type 2 diabetes mellitus without complications: Secondary | ICD-10-CM | POA: Diagnosis not present

## 2013-01-04 DIAGNOSIS — D126 Benign neoplasm of colon, unspecified: Secondary | ICD-10-CM

## 2013-01-04 DIAGNOSIS — Z860100 Personal history of colon polyps, unspecified: Secondary | ICD-10-CM

## 2013-01-04 DIAGNOSIS — I10 Essential (primary) hypertension: Secondary | ICD-10-CM | POA: Diagnosis not present

## 2013-01-04 DIAGNOSIS — Z1211 Encounter for screening for malignant neoplasm of colon: Secondary | ICD-10-CM | POA: Diagnosis not present

## 2013-01-04 LAB — GLUCOSE, CAPILLARY: Glucose-Capillary: 109 mg/dL — ABNORMAL HIGH (ref 70–99)

## 2013-01-04 MED ORDER — SODIUM CHLORIDE 0.9 % IV SOLN: 500.0000 mL | INTRAVENOUS | Status: DC

## 2013-01-04 NOTE — Progress Notes (Signed)
Called to room to assist during endoscopic procedure.  Patient ID and intended procedure confirmed with present staff. Received instructions for my participation in the procedure from the performing physician.  

## 2013-01-04 NOTE — Progress Notes (Signed)
Lidocaine-40mg IV prior to Propofol InductionPropofol given over incremental dosages 

## 2013-01-04 NOTE — Op Note (Signed)
[facility]  [address] [place], [address]   COLONOSCOPY PROCEDURE REPORT PATIENT: Kimberly [name], Kimberly [name]  MR#: [id number] BIRTHDATE: 1955-10-03 , 23  yrs. old GENDER: Female ENDOSCOPIST: Ladene Artist, MD, [facility] PROCEDURE DATE:  01/04/2013 PROCEDURE:   Colonoscopy with biopsy and snare polypectomy ASA CLASS:   Class II INDICATIONS:Patient's personal history of adenomatous colon polyps and Last colonoscopy performed 5 years ago. MEDICATIONS: MAC sedation, administered by CRNA and propofol (Diprivan) 150mg  IV DESCRIPTION OF PROCEDURE:   After the risks benefits and alternatives of the procedure were thoroughly explained, informed consent was obtained.  A digital rectal exam revealed no abnormalities of the rectum.   The LB [id number]  endoscope was introduced through the anus and advanced to the cecum, which was identified by both the appendix and ileocecal valve. No adverse events experienced.   The quality of the prep was good, using MoviPrep  The instrument was then slowly withdrawn as the colon was fully examined.  COLON FINDINGS: A sessile polyp measuring 6 mm in size was found in the ascending colon. A polypectomy was performed with a cold snare. The resection was complete and the polyp tissue was completely retrieved.  A sessile polyp measuring 8 mm in size was found in the transverse colon. A polypectomy was performed using snare cautery. The resection was complete and the polyp tissue was completely retrieved. Two sessile polyps measuring 4 mm in size were found in the descending colon. A polypectomy was performed with cold forceps.  The resection was complete and the polyp tissue was completely retrieved.  A sessile polyp measuring 7 mm in size was found in the sigmoid colon.  A polypectomy was performed with a cold snare. The resection was complete and the polyp tissue was completely retrieved. Mild diverticulosis was noted in the sigmoid colon.    The colon was otherwise normal.  There was no diverticulosis, inflammation, polyps or cancers unless previously stated.  Retroflexed views revealed internal hemorrhoids. The time to cecum=3 minutes 20 seconds.  Withdrawal time=12 minutes 38 seconds.  The scope was withdrawn and the procedure completed. COMPLICATIONS: There were no complications. ENDOSCOPIC IMPRESSION: 1.   Sessile polyp measuring 6 mm in the ascending colon; polypectomy performed with a cold snare 2.   Sessile polyp measuring 8 mm in the transverse colon; polypectomy performed using snare cautery 3.   Two sessile polyps measuring 4 mm in the descending colon; polypectomy performed with cold forceps 4.   Sessile polyp measuring 7 mm in the sigmoid colon; polypectomy performed with a cold snare 5.   Mild diverticulosis was noted in the sigmoid colon 6.   Small internal hemorrhoids RECOMMENDATIONS: 1.  Hold aspirin, aspirin products, and anti-inflammatory medication for 2 weeks. 2.  Repeat colonoscopy in 3 years if 3 or more polyps are adenomatous; otherwise 5 years 3.  High fiber diet with liberal fluid intake. 4.  Await pathology eSigned:  Ladene Artist, MD, [facility] 01/04/2013 9:02 AM  cc: Janalyn Rouse, MD

## 2013-01-04 NOTE — Progress Notes (Signed)
Patient did not experience any of the following events: a burn prior to discharge; a fall within the facility; wrong site/side/patient/procedure/implant event; or a hospital transfer or hospital admission upon discharge from the facility. (G8907) Patient did not have preoperative order for IV antibiotic SSI prophylaxis. (G8918)  

## 2013-01-04 NOTE — Patient Instructions (Addendum)

## 2013-01-05 ENCOUNTER — Telehealth: Payer: Self-pay | Admitting: *Deleted

## 2013-01-05 NOTE — Telephone Encounter (Signed)
No answer, left message to call if questions or concerns. 

## 2013-01-05 NOTE — Telephone Encounter (Signed)
Left message that we called for f/u 

## 2013-01-06 ENCOUNTER — Encounter (HOSPITAL_COMMUNITY): Payer: Self-pay | Admitting: Emergency Medicine

## 2013-01-06 ENCOUNTER — Observation Stay (HOSPITAL_COMMUNITY)
Admission: EM | Admit: 2013-01-06 | Discharge: 2013-01-07 | Disposition: A | Discharge status: Home / Self Care | Payer: Medicare Other | Attending: Family Medicine | Admitting: Family Medicine

## 2013-01-06 ENCOUNTER — Telehealth: Payer: Self-pay | Admitting: Gastroenterology

## 2013-01-06 DIAGNOSIS — I10 Essential (primary) hypertension: Secondary | ICD-10-CM | POA: Insufficient documentation

## 2013-01-06 DIAGNOSIS — Z860100 Personal history of colon polyps, unspecified: Secondary | ICD-10-CM | POA: Insufficient documentation

## 2013-01-06 DIAGNOSIS — E785 Hyperlipidemia, unspecified: Secondary | ICD-10-CM | POA: Insufficient documentation

## 2013-01-06 DIAGNOSIS — E119 Type 2 diabetes mellitus without complications: Secondary | ICD-10-CM | POA: Insufficient documentation

## 2013-01-06 DIAGNOSIS — Z79899 Other long term (current) drug therapy: Secondary | ICD-10-CM | POA: Insufficient documentation

## 2013-01-06 DIAGNOSIS — Z940 Kidney transplant status: Secondary | ICD-10-CM | POA: Diagnosis not present

## 2013-01-06 DIAGNOSIS — K921 Melena: Secondary | ICD-10-CM | POA: Diagnosis not present

## 2013-01-06 DIAGNOSIS — Z7982 Long term (current) use of aspirin: Secondary | ICD-10-CM | POA: Insufficient documentation

## 2013-01-06 DIAGNOSIS — Z794 Long term (current) use of insulin: Secondary | ICD-10-CM | POA: Insufficient documentation

## 2013-01-06 LAB — CBC WITH DIFFERENTIAL/PLATELET
Basophils Absolute: 0 10*3/uL (ref 0.0–0.1)
Basophils Relative: 0 % (ref 0–1)
Eosinophils Relative: 2 % (ref 0–5)
HCT: 35 % — ABNORMAL LOW (ref 36.0–46.0)
Lymphocytes Relative: 12 % (ref 12–46)
MCHC: 33.4 g/dL (ref 30.0–36.0)
MCV: 86.2 fL (ref 78.0–100.0)
Monocytes Absolute: 0.6 10*3/uL (ref 0.1–1.0)
Neutro Abs: 6.9 10*3/uL (ref 1.7–7.7)
Platelets: 231 10*3/uL (ref 150–400)
RDW: 13.4 % (ref 11.5–15.5)
WBC: 8.7 10*3/uL (ref 4.0–10.5)

## 2013-01-06 LAB — BASIC METABOLIC PANEL
Calcium: 10.1 mg/dL (ref 8.4–10.5)
Creatinine, Ser: 1.44 mg/dL — ABNORMAL HIGH (ref 0.50–1.10)
GFR calc Af Amer: 45 mL/min — ABNORMAL LOW (ref >90)
GFR calc non Af Amer: 39 mL/min — ABNORMAL LOW (ref >90)
Sodium: 138 mEq/L (ref 135–145)

## 2013-01-06 LAB — PROTIME-INR
INR: 1.05 (ref 0.00–1.49)
Prothrombin Time: 13.5 seconds (ref 11.6–15.2)

## 2013-01-06 LAB — GLUCOSE, CAPILLARY: Glucose-Capillary: 131 mg/dL — ABNORMAL HIGH (ref 70–99)

## 2013-01-06 MED ORDER — EZETIMIBE-SIMVASTATIN 10-20 MG PO TABS
1.0000 | ORAL_TABLET | Freq: Every day | ORAL | Status: DC
  Filled 2013-01-06: qty 1

## 2013-01-06 MED ORDER — SODIUM CHLORIDE 0.9 % IV SOLN: 250.0000 mL | INTRAVENOUS | Status: DC | PRN

## 2013-01-06 MED ORDER — ONDANSETRON HCL 4 MG PO TABS: 4.0000 mg | ORAL_TABLET | Freq: Four times a day (QID) | ORAL | Status: DC | PRN

## 2013-01-06 MED ORDER — ALLOPURINOL 100 MG PO TABS
100.0000 mg | ORAL_TABLET | Freq: Every day | ORAL | Status: DC
  Administered 2013-01-07: 100 mg via ORAL
  Filled 2013-01-06: qty 1

## 2013-01-06 MED ORDER — GLIPIZIDE 10 MG PO TABS
10.0000 mg | ORAL_TABLET | Freq: Two times a day (BID) | ORAL | Status: DC
  Administered 2013-01-06 – 2013-01-07 (×2): 10 mg via ORAL
  Filled 2013-01-06 (×5): qty 1

## 2013-01-06 MED ORDER — LABETALOL HCL 300 MG PO TABS
300.0000 mg | ORAL_TABLET | Freq: Two times a day (BID) | ORAL | Status: DC
  Administered 2013-01-06 – 2013-01-07 (×2): 300 mg via ORAL
  Filled 2013-01-06 (×3): qty 1

## 2013-01-06 MED ORDER — PREDNISONE 5 MG PO TABS
5.0000 mg | ORAL_TABLET | Freq: Every day | ORAL | Status: DC
  Administered 2013-01-07: 5 mg via ORAL
  Filled 2013-01-06 (×2): qty 1

## 2013-01-06 MED ORDER — TACROLIMUS 1 MG PO CAPS
5.0000 mg | ORAL_CAPSULE | Freq: Two times a day (BID) | ORAL | Status: DC
  Administered 2013-01-06: 4 mg via ORAL
  Administered 2013-01-07: 5 mg via ORAL
  Filled 2013-01-06 (×3): qty 5

## 2013-01-06 MED ORDER — ONDANSETRON HCL 4 MG/2ML IJ SOLN: 4.0000 mg | Freq: Four times a day (QID) | INTRAMUSCULAR | Status: DC | PRN

## 2013-01-06 MED ORDER — AMLODIPINE BESYLATE 10 MG PO TABS
10.0000 mg | ORAL_TABLET | Freq: Every day | ORAL | Status: DC
  Administered 2013-01-07: 10 mg via ORAL
  Filled 2013-01-06: qty 1

## 2013-01-06 MED ORDER — ACETAMINOPHEN 650 MG RE SUPP: 650.0000 mg | Freq: Four times a day (QID) | RECTAL | Status: DC | PRN

## 2013-01-06 MED ORDER — SODIUM CHLORIDE 0.9 % IJ SOLN: 3.0000 mL | INTRAMUSCULAR | Status: DC | PRN

## 2013-01-06 MED ORDER — SODIUM CHLORIDE 0.9 % IJ SOLN
3.0000 mL | Freq: Two times a day (BID) | INTRAMUSCULAR | Status: DC
  Administered 2013-01-06: 3 mL via INTRAVENOUS

## 2013-01-06 MED ORDER — POTASSIUM CHLORIDE IN NACL 20-0.45 MEQ/L-% IV SOLN
INTRAVENOUS | Status: DC
  Administered 2013-01-06: 18:00:00 via INTRAVENOUS
  Filled 2013-01-06 (×3): qty 1000

## 2013-01-06 MED ORDER — INSULIN GLARGINE 100 UNIT/ML ~~LOC~~ SOLN
26.0000 [IU] | Freq: Every day | SUBCUTANEOUS | Status: DC
  Administered 2013-01-06: 26 [IU] via SUBCUTANEOUS
  Filled 2013-01-06: qty 0.26

## 2013-01-06 MED ORDER — FAMOTIDINE 20 MG PO TABS
20.0000 mg | ORAL_TABLET | Freq: Every day | ORAL | Status: DC
  Filled 2013-01-06: qty 1

## 2013-01-06 MED ORDER — MYCOPHENOLATE MOFETIL 250 MG PO CAPS
250.0000 mg | ORAL_CAPSULE | Freq: Two times a day (BID) | ORAL | Status: DC
  Administered 2013-01-06: 250 mg via ORAL
  Filled 2013-01-06 (×2): qty 1

## 2013-01-06 MED ORDER — ACETAMINOPHEN 325 MG PO TABS: 650.0000 mg | ORAL_TABLET | Freq: Four times a day (QID) | ORAL | Status: DC | PRN

## 2013-01-06 MED ORDER — MYCOPHENOLATE MOFETIL 250 MG PO CAPS
500.0000 mg | ORAL_CAPSULE | Freq: Two times a day (BID) | ORAL | Status: DC
  Administered 2013-01-06: 250 mg via ORAL
  Administered 2013-01-07: 500 mg via ORAL
  Filled 2013-01-06 (×2): qty 2

## 2013-01-06 MED ORDER — HYDROCHLOROTHIAZIDE 25 MG PO TABS
25.0000 mg | ORAL_TABLET | Freq: Every day | ORAL | Status: DC
  Administered 2013-01-07: 25 mg via ORAL
  Filled 2013-01-06: qty 1

## 2013-01-06 NOTE — Telephone Encounter (Signed)
Patient had a colonoscopy on 01/04/13 with 5 polyps removed.  Wed she had two BM with a small amount of blood in her stool.  This am she had a formed BM with a moderate amount of blood in the stool, but then had a large "diarrhea like" BM with a large amount of bright red blood.  She denies any abdominal pain, cramping, SOB, or dizziness.  Dr. Fuller Plan please advise

## 2013-01-06 NOTE — Telephone Encounter (Signed)
Patient advised.  She will go now.  She is advised not to drive and go now

## 2013-01-06 NOTE — H&P (Signed)
Primary Care Physician:  Janalyn Rouse, MD Primary Gastroenterologist:  Dr. Fuller Plan       CHIEF COMPLAINT:  Rectal bleeding  HPI: Kimberly [name] is a 57 y.o. 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  Had colonoscopy by Dr. Fuller Plan for surveillance purposes two days ago on 7/15.  Had 5 sessile polyps removed throughout the colon.  Also had diverticulosis and small hemorrhoids.  Was sent to the ED by our office this AM after calling with reports of rectal bleeding.  Bleeding began yesterday and is associated with BM's.  Had two BM's with blood yesterday and two more today.  No passing blood between BM's.  No abdominal pain or other complaints.  Hgb is 11.7 grams in the ED with no previous for comparison.  Potassium is slightly low, but she does take some orally at home.  Cr slightly bumped from baseline.  GI was called to see her in the ED and help with disposition.   Past Medical History  Diagnosis Date  . Seasonal allergies   . Diabetes   . Hyperlipidemia   . Hypertension   . Kidney disease     Past Surgical History  Procedure Laterality Date  . Kidney transplant  June 2010  . Ganglion cyst excision Right 1994    Prior to Admission medications   Medication Sig Start Date End Date Taking? Authorizing Provider  allopurinol (ZYLOPRIM) 100 MG tablet Take 100 mg by mouth daily.   Yes Historical Provider, MD  amLODipine (NORVASC) 10 MG tablet Take 10 mg by mouth daily.   Yes Historical Provider, MD  aspirin 81 MG tablet Take 81 mg by mouth daily.   Yes Historical Provider, MD  ezetimibe-simvastatin (VYTORIN) 10-20 MG per tablet Take 1 tablet by mouth at bedtime.   Yes Historical Provider, MD  glipiZIDE (GLUCOTROL) 10 MG tablet Take 10 mg by mouth 2 (two) times daily before a meal.   Yes Historical Provider, MD  hydrochlorothiazide (HYDRODIURIL) 25 MG tablet Take 25 mg by mouth daily.   Yes Historical Provider, MD   insulin glargine (LANTUS) 100 UNIT/ML injection Inject 26 Units into the skin at bedtime.    Yes Historical Provider, MD  labetalol (NORMODYNE) 200 MG tablet Take 300 mg by mouth 2 (two) times daily.   Yes Historical Provider, MD  mycophenolate (CELLCEPT) 250 MG capsule Take by mouth 2 (two) times daily.   Yes Historical Provider, MD  potassium chloride (K-DUR,KLOR-CON) 10 MEQ tablet Take 10 mEq by mouth daily.   Yes Historical Provider, MD  predniSONE (DELTASONE) 5 MG tablet Take 5 mg by mouth daily.   Yes Historical Provider, MD  ranitidine (ZANTAC) 150 MG capsule Take 150 mg by mouth every evening.   Yes Historical Provider, MD  tacrolimus (PROGRAF) 1 MG capsule Take 5 mg by mouth 2 (two) times daily.   Yes Historical Provider, MD    Current Facility-Administered Medications  Medication Dose Route Frequency Provider Last Rate Last Dose  . 0.45 % NaCl with KCl 20 mEq / L infusion   Intravenous Continuous Jessica D. Zehr, PA-C      . 0.9 %  sodium chloride infusion  250 mL Intravenous PRN Jessica D. Zehr, PA-C      . acetaminophen (TYLENOL) tablet 650 mg  650 mg Oral Q6H PRN Laban Emperor. Zehr, PA-C       Or  . acetaminophen (TYLENOL) suppository 650 mg  650 mg Rectal Q6H  PRN Laban Emperor. Zehr, PA-C      . allopurinol (ZYLOPRIM) tablet 100 mg  100 mg Oral Daily Jessica D. Zehr, PA-C      . amLODipine (NORVASC) tablet 10 mg  10 mg Oral Daily Jessica D. Zehr, PA-C      . ezetimibe-simvastatin (VYTORIN) 10-20 MG per tablet 1 tablet  1 tablet Oral QHS Jessica D. Zehr, PA-C      . famotidine (PEPCID) tablet 20 mg  20 mg Oral QHS Jessica D. Zehr, PA-C      . glipiZIDE (GLUCOTROL) tablet 10 mg  10 mg Oral BID AC Jessica D. Zehr, PA-C      . hydrochlorothiazide (HYDRODIURIL) tablet 25 mg  25 mg Oral Daily Jessica D. Zehr, PA-C      . insulin glargine (LANTUS) injection 26 Units  26 Units Subcutaneous QHS Jessica D. Zehr, PA-C      . labetalol (NORMODYNE) tablet 300 mg  300 mg Oral BID Janett Billow D. Zehr,  PA-C      . mycophenolate (CELLCEPT) capsule 250 mg  250 mg Oral BID Janett Billow D. Zehr, PA-C      . ondansetron (ZOFRAN) tablet 4 mg  4 mg Oral Q6H PRN Laban Emperor. Zehr, PA-C       Or  . ondansetron (ZOFRAN) injection 4 mg  4 mg Intravenous Q6H PRN Jessica D. Zehr, PA-C      . predniSONE (DELTASONE) tablet 5 mg  5 mg Oral Daily Jessica D. Zehr, PA-C      . sodium chloride 0.9 % injection 3 mL  3 mL Intravenous Q12H Jessica D. Zehr, PA-C      . sodium chloride 0.9 % injection 3 mL  3 mL Intravenous PRN Jessica D. Zehr, PA-C      . tacrolimus (PROGRAF) capsule 5 mg  5 mg Oral BID Jessica D. Zehr, PA-C       Current Outpatient Prescriptions  Medication Sig Dispense Refill  . allopurinol (ZYLOPRIM) 100 MG tablet Take 100 mg by mouth daily.      Marland Kitchen amLODipine (NORVASC) 10 MG tablet Take 10 mg by mouth daily.      Marland Kitchen aspirin 81 MG tablet Take 81 mg by mouth daily.      Marland Kitchen ezetimibe-simvastatin (VYTORIN) 10-20 MG per tablet Take 1 tablet by mouth at bedtime.      Marland Kitchen glipiZIDE (GLUCOTROL) 10 MG tablet Take 10 mg by mouth 2 (two) times daily before a meal.      . hydrochlorothiazide (HYDRODIURIL) 25 MG tablet Take 25 mg by mouth daily.      . insulin glargine (LANTUS) 100 UNIT/ML injection Inject 26 Units into the skin at bedtime.       Marland Kitchen labetalol (NORMODYNE) 200 MG tablet Take 300 mg by mouth 2 (two) times daily.      . mycophenolate (CELLCEPT) 250 MG capsule Take by mouth 2 (two) times daily.      . potassium chloride (K-DUR,KLOR-CON) 10 MEQ tablet Take 10 mEq by mouth daily.      . predniSONE (DELTASONE) 5 MG tablet Take 5 mg by mouth daily.      . ranitidine (ZANTAC) 150 MG capsule Take 150 mg by mouth every evening.      . tacrolimus (PROGRAF) 1 MG capsule Take 5 mg by mouth 2 (two) times daily.        Allergies as of 01/06/2013  . (No Known Allergies)    Family History  Problem Relation Age of Onset  .  Colon cancer Sister   . Colon cancer Brother     History   Social History  . Marital  Status: Married    Spouse Name: N/A    Number of Children: N/A  . Years of Education: N/A   Occupational History  . Not on file.   Social History Main Topics  . Smoking status: Former Smoker    Quit date: 07/09/1997  . Smokeless tobacco: Never Used  . Alcohol Use: No  . Drug Use: No  . Sexually Active: Not on file   Other Topics Concern  . Not on file   Social History Narrative  . No narrative on file    Review of Systems: Ten point ROS is O/W negative except as mentioned in HPI.  Physical Exam: Vital signs in last 24 hours: Temp:  [97.8 F (36.6 C)] 97.8 F (36.6 C) (07/17 1039) Pulse Rate:  [74] 74 (07/17 1039) Resp:  [16] 16 (07/17 1039) BP: (135)/(65) 135/65 mmHg (07/17 1039) SpO2:  [97 %] 97 % (07/17 1039)   General:   Alert, Well-developed, well-nourished, pleasant and cooperative in NAD Head:  Normocephalic and atraumatic. Eyes:  Sclera clear, no icterus.  Conjunctiva pink. Ears:  Normal auditory acuity.  Mouth:  No deformity or lesions.  Oropharynx pink & moist. Lungs:  Clear throughout to auscultation.  No wheezes, crackles, or rhonchi. No acute distress. Heart:  Regular rate and rhythm; no murmurs, clicks, rubs, or gallops. Abdomen:  Soft, non-tender and non-distended. No masses, hepatosplenomegaly or hernias noted. Normal bowel sounds, without guarding, and without rebound.   Msk:  Symmetrical without gross deformities. Normal posture. Pulses:  Normal pulses noted. Extremities:  Without clubbing or edema.  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 Neurologic:  Alert and  oriented x4;  grossly normal neurologically. Skin:  Intact without significant lesions or rashes. Psych:  Alert and cooperative. Normal mood and affect.  Lab Results:  Recent Labs  01/06/13 1130  WBC 8.7  HGB 11.7*  HCT 35.0*  PLT 231   BMET  Recent Labs  01/06/13 1130  NA 138  K 3.2*  CL 103  CO2 22  GLUCOSE 173*  BUN 23  CREATININE 1.44*  CALCIUM 10.1    PT/INR  Recent Labs  01/06/13 1130  LABPROT 13.5  INR 1.05   Impression / Plan: -LGIB:  Likely post-polypectomy bleed.  At this point her Hgb is normal.  Will admit her to observation.  Gentle IVF's with potassium.  Clear liquids.  Recheck labs in AM.  Restart home meds. -IDDM -CKD s/p transplant in 11/2008 -Hyperlipidemia -HTN     LOS: 0 days   ZEHR, JESSICA D.  01/06/2013, 1:25 PM  Chart was reviewed and patient was examined. X-rays and lab were reviewed.   Limited postpolypectomy bleeding.  She needs observation, intervention if bleeding does not subside next 24 hours.   Sandy Salaam. Deatra Ina, M.D., [facility] Cell [phone]

## 2013-01-06 NOTE — ED Provider Notes (Signed)
History    CSN: [id number] Arrival date & time 01/06/13  1026  First MD Initiated Contact with Patient 01/06/13 1048     Chief Complaint  Patient presents with  . Rectal Bleeding   (Consider location/radiation/quality/duration/timing/severity/associated sxs/prior Treatment) HPI  Patient presents with concern for rectal bleeding. Outpatient colonoscopy 3 days ago. She notes that all bowel movements since the colonoscopy have had significant amounts of blood with stool. There is no new not medication related rectal bleeding. There is no lightheadedness, no syncope, no chest pain, dyspnea. The patient also denies abdominal pain. Patient had colonoscopy done as a scheduled procedure.  Multiple polyps were removed.   Past Medical History  Diagnosis Date  . Seasonal allergies   . Diabetes   . Hyperlipidemia   . Hypertension   . Kidney disease    Past Surgical History  Procedure Laterality Date  . Kidney transplant  June 2010  . Ganglion cyst excision Right 1994   Family History  Problem Relation Age of Onset  . Colon cancer Sister   . Colon cancer Brother    History  Substance Use Topics  . Smoking status: Former Smoker    Quit date: 07/09/1997  . Smokeless tobacco: Never Used  . Alcohol Use: No   OB History   Grav Para Term Preterm Abortions TAB SAB Ect Mult Living                 Review of Systems  All other systems reviewed and are negative.    Allergies  Review of patient's allergies indicates no known allergies.  Home Medications   Current Outpatient Rx  Name  Route  Sig  Dispense  Refill  . allopurinol (ZYLOPRIM) 100 MG tablet   Oral   Take 100 mg by mouth daily.         Marland Kitchen amLODipine (NORVASC) 10 MG tablet   Oral   Take 10 mg by mouth daily.         Marland Kitchen aspirin 81 MG tablet   Oral   Take 81 mg by mouth daily.         Marland Kitchen ezetimibe-simvastatin (VYTORIN) 10-20 MG per tablet   Oral   Take 1 tablet by mouth at bedtime.         Marland Kitchen  glipiZIDE (GLUCOTROL) 10 MG tablet   Oral   Take 10 mg by mouth 2 (two) times daily before a meal.         . hydrochlorothiazide (HYDRODIURIL) 25 MG tablet   Oral   Take 25 mg by mouth daily.         . insulin glargine (LANTUS) 100 UNIT/ML injection   Subcutaneous   Inject 26 Units into the skin at bedtime.          Marland Kitchen labetalol (NORMODYNE) 200 MG tablet   Oral   Take 300 mg by mouth 2 (two) times daily.         . mycophenolate (CELLCEPT) 250 MG capsule   Oral   Take by mouth 2 (two) times daily.         . potassium chloride (K-DUR,KLOR-CON) 10 MEQ tablet   Oral   Take 10 mEq by mouth daily.         . predniSONE (DELTASONE) 5 MG tablet   Oral   Take 5 mg by mouth daily.         . ranitidine (ZANTAC) 150 MG capsule   Oral   Take 150 mg by mouth  every evening.         . tacrolimus (PROGRAF) 1 MG capsule   Oral   Take 5 mg by mouth 2 (two) times daily.          BP 135/65  Pulse 74  Temp(Src) 97.8 F (36.6 C) (Oral)  Resp 16  SpO2 97% Physical Exam  Nursing note and vitals reviewed. Constitutional: She is oriented to person, place, and time. She appears well-developed and well-nourished. No distress.  HENT:  Head: Normocephalic and atraumatic.  Eyes: Conjunctivae and EOM are normal.  Cardiovascular: Normal rate and regular rhythm.   Pulmonary/Chest: Effort normal and breath sounds normal. No stridor. No respiratory distress.  Abdominal: She exhibits no distension.  Genitourinary: Rectal exam shows external hemorrhoid.  No gross bleeding  Musculoskeletal: She exhibits no edema.  Neurological: She is alert and oriented to person, place, and time. No cranial nerve deficit.  Skin: Skin is warm and dry.  Psychiatric: She has a normal mood and affect.    ED Course  Procedures (including critical care time) Labs Reviewed  CBC WITH DIFFERENTIAL - Abnormal; Notable for the following:    Hemoglobin 11.7 (*)    HCT 35.0 (*)    Neutrophils Relative %  79 (*)    All other components within normal limits  BASIC METABOLIC PANEL - Abnormal; Notable for the following:    Potassium 3.2 (*)    Glucose, Bld 173 (*)    Creatinine, Ser 1.44 (*)    GFR calc non Af Amer 39 (*)    GFR calc Af Amer 45 (*)    All other components within normal limits  PROTIME-INR  TYPE AND SCREEN  ABO/RH   No results found. No diagnosis found. Labs notable for mild anemia, mild elevated creatinine. Subsequently I spoke with her gastroenterologists. MDM  Patient presents with GI bleed following colonoscopy.  The absence of distress, abdominal pain, hemodynamic instability all reassuring, suggestive of post procedural bleeding.  After return of initial labs, discussed patient's case with our gastroenterologist, will assist with disposition.  Carmin Muskrat, MD 01/06/13 1301

## 2013-01-06 NOTE — Telephone Encounter (Signed)
[facility] ED evaluation urgently this am.

## 2013-01-06 NOTE — ED Notes (Addendum)
Pt c/o of rectal bleeding that started Wednesday morning, described as bright red. Has had 4 BM since then and has been blood in each stool. Had colonoscopy on Tuesday with removal of polyps. Marland Kitchen

## 2013-01-07 DIAGNOSIS — K921 Melena: Secondary | ICD-10-CM | POA: Diagnosis not present

## 2013-01-07 LAB — GLUCOSE, CAPILLARY
Glucose-Capillary: 162 mg/dL — ABNORMAL HIGH (ref 70–99)
Glucose-Capillary: 130 mg/dL — ABNORMAL HIGH (ref 70–99)
Glucose-Capillary: 111 mg/dL — ABNORMAL HIGH (ref 70–99)

## 2013-01-07 LAB — BASIC METABOLIC PANEL
BUN: 17 mg/dL (ref 6–23)
CO2: 21 mEq/L (ref 19–32)
Chloride: 106 mEq/L (ref 96–112)
Creatinine, Ser: 0.98 mg/dL (ref 0.50–1.10)
Potassium: 3 mEq/L — ABNORMAL LOW (ref 3.5–5.1)

## 2013-01-07 MED ORDER — FAMOTIDINE 20 MG PO TABS
20.0000 mg | ORAL_TABLET | Freq: Every day | ORAL | Status: DC
  Administered 2013-01-07: 20 mg via ORAL
  Filled 2013-01-07: qty 1

## 2013-01-07 MED ORDER — POTASSIUM CHLORIDE CRYS ER 20 MEQ PO TBCR
20.0000 meq | EXTENDED_RELEASE_TABLET | Freq: Once | ORAL | Status: AC
  Administered 2013-01-07: 20 meq via ORAL
  Filled 2013-01-07: qty 1

## 2013-01-07 MED ORDER — EZETIMIBE-SIMVASTATIN 10-20 MG PO TABS
1.0000 | ORAL_TABLET | Freq: Every day | ORAL | Status: DC
  Administered 2013-01-07: 1 via ORAL
  Filled 2013-01-07: qty 1

## 2013-01-07 NOTE — Progress Notes (Signed)
No further overt bleeding.  Hg stable. Plan d/c home today.

## 2013-01-07 NOTE — Progress Notes (Signed)
Patient d/c home,stable,alert and Haywood Filler RN

## 2013-01-07 NOTE — Progress Notes (Signed)
[facility] Progress Note  Subjective:  No bleeding; no BM.  Tolerating clear liquids.  No complaints.  Objective:  Vital signs in last 24 hours: Temp:  [97.2 F (36.2 C)-98.1 F (36.7 C)] 98 F (36.7 C) (07/18 0413) Pulse Rate:  [58-78] 74 (07/18 0413) Resp:  [16-18] 18 (07/18 0413) BP: (124-155)/(60-75) 137/68 mmHg (07/18 0413) SpO2:  [95 %-100 %] 96 % (07/18 0413) Weight:  [168 lb (76.204 kg)] 168 lb (76.204 kg) (07/17 1430) Last BM Date: 01/06/13 General:   Alert, Well-developed, in NAD Heart:  Regular rate and rhythm; no murmurs Pulm:  CTAB.  No W/R/R. Abdomen:  Soft, nontender and nondistended. Normal bowel sounds, without guarding, and without rebound.   Extremities:  Without edema. Neurologic:  Alert and  oriented x4;  grossly normal neurologically. Psych:  Alert and cooperative. Normal mood and affect.  Intake/Output from previous day: 07/17 0701 - 07/18 0700 In: 1786.7 [P.O.:1200; I.V.:586.7] Out: 3000 [Urine:3000]  Lab Results:  Recent Labs  01/06/13 1130 01/07/13 0430  WBC 8.7  --   HGB 11.7* 12.5  HCT 35.0* 36.1  PLT 231  --    BMET  Recent Labs  01/06/13 1130 01/07/13 0430  NA 138 139  K 3.2* 3.0*  CL 103 106  CO2 22 21  GLUCOSE 173* 64*  BUN 23 17  CREATININE 1.44* 0.98  CALCIUM 10.1 10.2   PT/INR  Recent Labs  01/06/13 1130  LABPROT 13.5  INR 1.05   Assessment / Plan: -LGIB: Likely post-polypectomy bleed that seems to have resolved.  Hgb stable/normal.  Will plan for D/C home today. -IDDM  -CKD s/p transplant in 11/2008  -Hyperlipidemia  -HTN       LOS: 1 day   ZEHR, JESSICA D.  01/07/2013, 9:08 AM  Pager number [phone]   I have personally taken an interval history, reviewed the chart, and examined the patient.  I agree with the extender's note, impression and recommendations.  Sandy Salaam. Deatra Ina, MD, [facility] [phone]

## 2013-01-07 NOTE — Plan of Care (Signed)
Problem: Phase II Progression Outcomes Goal: No active bleeding Outcome: Completed/Met Date Met:  01/07/13 No reports by patient of any stools nor bleeding during the night.

## 2013-01-07 NOTE — Progress Notes (Addendum)
Hypoglycemic Event  CBG: 65 at 0606  Treatment: 15 GM carbohydrate snack x2 --2 orange juices given    Symptoms: None  Follow-up CBG: Time 0635 CBG Result:130  Possible Reasons for Event: Unknown  Comments/MD notified:     Kimberly [name]  Remember to initiate Hypoglycemia Order Set & complete

## 2013-01-07 NOTE — Progress Notes (Signed)
Patient refused SCDs.

## 2013-01-07 NOTE — Progress Notes (Addendum)
Patient reports that she takes prograf 5 mg in am and 4 mg in pm at home. Also her cellcept is 500 mg bid not 250 mg. Both are listed wrong on med rec form. Spoke to pharmacy about it then paged MD on call K Black about it.

## 2013-01-07 NOTE — Discharge Summary (Signed)
[facility] Discharge Summary  Name: Kimberly [name] MRN: [id number] DOB: 01/11/1956 57 y.o. PCP:  Janalyn Rouse, MD  Date of Admission: 01/06/2013 10:36 AM Date of Discharge: 01/07/2013 Attending Physician: Elson Clan, MD  Discharge Diagnosis: Active Problems:   Blood in stool   Consultations: Treatment Team:  Inda Castle, MD  Procedures Performed:  No results found.  GI Procedures: None  History/Physical Exam:  See Admission H&P  Admission HPI:  Patient was admitted to [facility] for observation on 7/17.  She presented to the hospital after having some rectal bleeding two days post-colonoscopy with polypectomies.  Hgb was stable, but it was decided that she should be monitored overnight.  She did not have any further BM's since admission.  Was initially placed on clear liquid diet, but tolerated that well.  Her Hgb remained normal the next day.  Her Cr had been slightly elevated, but corrected with IVF's.  Potassium was slightly low despite potassium in IVF's so she was given a dose of oral potassium prior to discharge.  No other complaints and feeling well at time of discharge.   Discharge Vitals:  BP 137/68  Pulse 74  Temp(Src) 98 F (36.7 C) (Oral)  Resp 18  Ht 5\' 6"  (1.676 m)  Wt 168 lb (76.204 kg)  BMI 27.13 kg/m2  SpO2 96%  Discharge Labs:  Results for orders placed during the hospital encounter of 01/06/13 (from the past 24 hour(s))  ABO/RH     Status: None   Collection Time    01/06/13 11:22 AM      Result Value Range   ABO/RH(D) O POS    CBC WITH DIFFERENTIAL     Status: Abnormal   Collection Time    01/06/13 11:30 AM      Result Value Range   WBC 8.7  4.0 - 10.5 K/uL   RBC 4.06  3.87 - 5.11 MIL/uL   Hemoglobin 11.7 (*) 12.0 - 15.0 g/dL   HCT 35.0 (*) 36.0 - 46.0 %   MCV 86.2  78.0 - 100.0 fL   MCH 28.8  26.0 - 34.0 pg   MCHC 33.4  30.0 - 36.0 g/dL   RDW 13.4  11.5 - 15.5 %   Platelets 231  150 - 400 K/uL   Neutrophils Relative % 79 (*) 43  - 77 %   Neutro Abs 6.9  1.7 - 7.7 K/uL   Lymphocytes Relative 12  12 - 46 %   Lymphs Abs 1.0  0.7 - 4.0 K/uL   Monocytes Relative 7  3 - 12 %   Monocytes Absolute 0.6  0.1 - 1.0 K/uL   Eosinophils Relative 2  0 - 5 %   Eosinophils Absolute 0.2  0.0 - 0.7 K/uL   Basophils Relative 0  0 - 1 %   Basophils Absolute 0.0  0.0 - 0.1 K/uL  BASIC METABOLIC PANEL     Status: Abnormal   Collection Time    01/06/13 11:30 AM      Result Value Range   Sodium 138  135 - 145 mEq/L   Potassium 3.2 (*) 3.5 - 5.1 mEq/L   Chloride 103  96 - 112 mEq/L   CO2 22  19 - 32 mEq/L   Glucose, Bld 173 (*) 70 - 99 mg/dL   BUN 23  6 - 23 mg/dL   Creatinine, Ser 1.44 (*) 0.50 - 1.10 mg/dL   Calcium 10.1  8.4 - 10.5 mg/dL   GFR calc non Af  Amer 39 (*) >90 mL/min   GFR calc Af Amer 45 (*) >90 mL/min  PROTIME-INR     Status: None   Collection Time    01/06/13 11:30 AM      Result Value Range   Prothrombin Time 13.5  11.6 - 15.2 seconds   INR 1.05  0.00 - 1.49  TYPE AND SCREEN     Status: None   Collection Time    01/06/13 11:30 AM      Result Value Range   ABO/RH(D) O POS     Antibody Screen NEG     Sample Expiration 01/09/2013    GLUCOSE, CAPILLARY     Status: Abnormal   Collection Time    01/06/13  6:16 PM      Result Value Range   Glucose-Capillary 162 (*) 70 - 99 mg/dL   Comment 1 Notify RN    GLUCOSE, CAPILLARY     Status: Abnormal   Collection Time    01/06/13  9:08 PM      Result Value Range   Glucose-Capillary 131 (*) 70 - 99 mg/dL   Comment 1 Notify RN    HEMOGLOBIN AND HEMATOCRIT, BLOOD     Status: None   Collection Time    01/07/13  4:30 AM      Result Value Range   Hemoglobin 12.5  12.0 - 15.0 g/dL   HCT 36.1  36.0 - [id number] %  BASIC METABOLIC PANEL     Status: Abnormal   Collection Time    01/07/13  4:30 AM      Result Value Range   Sodium 139  135 - 145 mEq/L   Potassium 3.0 (*) 3.5 - 5.1 mEq/L   Chloride 106  96 - 112 mEq/L   CO2 21  19 - 32 mEq/L   Glucose, Bld 64 (*) 70 -  99 mg/dL   BUN 17  6 - 23 mg/dL   Creatinine, Ser 0.98  0.50 - 1.10 mg/dL   Calcium 10.2  8.4 - 10.5 mg/dL   GFR calc non Af Amer 62 (*) >90 mL/min   GFR calc Af Amer 72 (*) >90 mL/min  GLUCOSE, CAPILLARY     Status: Abnormal   Collection Time    01/07/13  6:06 AM      Result Value Range   Glucose-Capillary 65 (*) 70 - 99 mg/dL   Comment 1 Notify RN    GLUCOSE, CAPILLARY     Status: Abnormal   Collection Time    01/07/13  6:42 AM      Result Value Range   Glucose-Capillary 130 (*) 70 - 99 mg/dL   Comment 1 Notify RN    GLUCOSE, CAPILLARY     Status: Abnormal   Collection Time    01/07/13  7:53 AM      Result Value Range   Glucose-Capillary 111 (*) 70 - 99 mg/dL   Comment 1 Documented in Chart     Comment 2 Notify RN      Disposition and follow-up:   Kimberly [name] was discharged from [facility] in stable condition.    Follow-up Appointments:  Follow-up with her nephrologist as already scheduled next week.   Discharge Medications:   Medication List    ASK your doctor about these medications       allopurinol 100 MG tablet  Commonly known as:  ZYLOPRIM  Take 100 mg by mouth daily.     amLODipine 10 MG tablet  Commonly  known as:  NORVASC  Take 10 mg by mouth daily.     aspirin 81 MG tablet  Take 81 mg by mouth daily.     ezetimibe-simvastatin 10-20 MG per tablet  Commonly known as:  VYTORIN  Take 1 tablet by mouth at bedtime.     glipiZIDE 10 MG tablet  Commonly known as:  GLUCOTROL  Take 10 mg by mouth 2 (two) times daily before a meal.     hydrochlorothiazide 25 MG tablet  Commonly known as:  HYDRODIURIL  Take 25 mg by mouth daily.     insulin glargine 100 UNIT/ML injection  Commonly known as:  LANTUS  Inject 26 Units into the skin at bedtime.     labetalol 200 MG tablet  Commonly known as:  NORMODYNE  Take 300 mg by mouth 2 (two) times daily.     mycophenolate 250 MG capsule  Commonly known as:  CELLCEPT  Take by mouth 2 (two)  times daily.     potassium chloride 10 MEQ tablet  Commonly known as:  K-DUR,KLOR-CON  Take 10 mEq by mouth daily.     predniSONE 5 MG tablet  Commonly known as:  DELTASONE  Take 5 mg by mouth daily.     ranitidine 150 MG capsule  Commonly known as:  ZANTAC  Take 150 mg by mouth every evening.     tacrolimus 1 MG capsule  Commonly known as:  PROGRAF  Take 5 mg by mouth 2 (two) times daily.        SignedMyrtice [name], Kimberly D. 01/07/2013, 9:10 AM   I have personally taken an interval history, reviewed the chart, and examined the patient.  I agree with the extender's note, impression and recommendations.  Sandy Salaam. Deatra Ina, MD, [facility] [phone]

## 2013-01-07 NOTE — Plan of Care (Signed)
Problem: Phase II Progression Outcomes Goal: Tolerating diet Outcome: Completed/Met Date Met:  01/07/13 No nausea

## 2013-01-07 NOTE — Progress Notes (Signed)
Patient's d/c instructions given, verbalized understanding. Stable, no bleeding noted.No c/o pain- Nedra Hai iar RN

## 2013-01-10 ENCOUNTER — Encounter: Payer: Self-pay | Admitting: Gastroenterology

## 2013-01-12 DIAGNOSIS — Z940 Kidney transplant status: Secondary | ICD-10-CM | POA: Diagnosis not present

## 2013-01-12 DIAGNOSIS — E119 Type 2 diabetes mellitus without complications: Secondary | ICD-10-CM | POA: Diagnosis not present

## 2013-01-12 DIAGNOSIS — N182 Chronic kidney disease, stage 2 (mild): Secondary | ICD-10-CM | POA: Diagnosis not present

## 2013-01-12 DIAGNOSIS — I129 Hypertensive chronic kidney disease with stage 1 through stage 4 chronic kidney disease, or unspecified chronic kidney disease: Secondary | ICD-10-CM | POA: Diagnosis not present

## 2013-01-27 DIAGNOSIS — E119 Type 2 diabetes mellitus without complications: Secondary | ICD-10-CM | POA: Diagnosis not present

## 2013-02-03 DIAGNOSIS — E119 Type 2 diabetes mellitus without complications: Secondary | ICD-10-CM | POA: Diagnosis not present

## 2013-02-03 DIAGNOSIS — Z940 Kidney transplant status: Secondary | ICD-10-CM | POA: Diagnosis not present

## 2013-02-03 DIAGNOSIS — E785 Hyperlipidemia, unspecified: Secondary | ICD-10-CM | POA: Diagnosis not present

## 2013-02-03 DIAGNOSIS — Z79899 Other long term (current) drug therapy: Secondary | ICD-10-CM | POA: Diagnosis not present

## 2013-03-22 DIAGNOSIS — I10 Essential (primary) hypertension: Secondary | ICD-10-CM | POA: Diagnosis not present

## 2013-03-22 DIAGNOSIS — E1169 Type 2 diabetes mellitus with other specified complication: Secondary | ICD-10-CM | POA: Diagnosis not present

## 2013-03-22 DIAGNOSIS — Z940 Kidney transplant status: Secondary | ICD-10-CM | POA: Diagnosis not present

## 2013-03-22 DIAGNOSIS — E785 Hyperlipidemia, unspecified: Secondary | ICD-10-CM | POA: Diagnosis not present

## 2013-03-22 DIAGNOSIS — Z6827 Body mass index (BMI) 27.0-27.9, adult: Secondary | ICD-10-CM | POA: Diagnosis not present

## 2013-03-22 DIAGNOSIS — B079 Viral wart, unspecified: Secondary | ICD-10-CM | POA: Diagnosis not present

## 2013-03-24 DIAGNOSIS — Z124 Encounter for screening for malignant neoplasm of cervix: Secondary | ICD-10-CM | POA: Diagnosis not present

## 2013-03-24 DIAGNOSIS — Z1231 Encounter for screening mammogram for malignant neoplasm of breast: Secondary | ICD-10-CM | POA: Diagnosis not present

## 2013-04-01 DIAGNOSIS — Z23 Encounter for immunization: Secondary | ICD-10-CM | POA: Diagnosis not present

## 2013-04-11 DIAGNOSIS — Z79899 Other long term (current) drug therapy: Secondary | ICD-10-CM | POA: Diagnosis not present

## 2013-04-11 DIAGNOSIS — Z940 Kidney transplant status: Secondary | ICD-10-CM | POA: Diagnosis not present

## 2013-04-11 DIAGNOSIS — N182 Chronic kidney disease, stage 2 (mild): Secondary | ICD-10-CM | POA: Diagnosis not present

## 2013-04-19 DIAGNOSIS — E119 Type 2 diabetes mellitus without complications: Secondary | ICD-10-CM | POA: Diagnosis not present

## 2013-04-19 DIAGNOSIS — Z79899 Other long term (current) drug therapy: Secondary | ICD-10-CM | POA: Diagnosis not present

## 2013-04-19 DIAGNOSIS — E785 Hyperlipidemia, unspecified: Secondary | ICD-10-CM | POA: Diagnosis not present

## 2013-04-19 DIAGNOSIS — Z940 Kidney transplant status: Secondary | ICD-10-CM | POA: Diagnosis not present

## 2013-06-07 DIAGNOSIS — Z940 Kidney transplant status: Secondary | ICD-10-CM | POA: Diagnosis not present

## 2013-06-07 DIAGNOSIS — J069 Acute upper respiratory infection, unspecified: Secondary | ICD-10-CM | POA: Diagnosis not present

## 2013-06-07 DIAGNOSIS — Z6827 Body mass index (BMI) 27.0-27.9, adult: Secondary | ICD-10-CM | POA: Diagnosis not present

## 2013-07-05 DIAGNOSIS — Z940 Kidney transplant status: Secondary | ICD-10-CM | POA: Diagnosis not present

## 2013-07-05 DIAGNOSIS — D899 Disorder involving the immune mechanism, unspecified: Secondary | ICD-10-CM | POA: Diagnosis not present

## 2013-07-05 DIAGNOSIS — E876 Hypokalemia: Secondary | ICD-10-CM | POA: Diagnosis not present

## 2013-07-05 DIAGNOSIS — I129 Hypertensive chronic kidney disease with stage 1 through stage 4 chronic kidney disease, or unspecified chronic kidney disease: Secondary | ICD-10-CM | POA: Diagnosis not present

## 2013-07-14 DIAGNOSIS — E119 Type 2 diabetes mellitus without complications: Secondary | ICD-10-CM | POA: Diagnosis not present

## 2013-07-14 DIAGNOSIS — E785 Hyperlipidemia, unspecified: Secondary | ICD-10-CM | POA: Diagnosis not present

## 2013-07-14 DIAGNOSIS — Z940 Kidney transplant status: Secondary | ICD-10-CM | POA: Diagnosis not present

## 2013-07-19 ENCOUNTER — Other Ambulatory Visit: Payer: Self-pay | Admitting: Dermatology

## 2013-07-19 DIAGNOSIS — D0460 Carcinoma in situ of skin of unspecified upper limb, including shoulder: Secondary | ICD-10-CM | POA: Diagnosis not present

## 2013-07-19 DIAGNOSIS — L723 Sebaceous cyst: Secondary | ICD-10-CM | POA: Diagnosis not present

## 2013-08-02 ENCOUNTER — Other Ambulatory Visit: Payer: Self-pay | Admitting: Dermatology

## 2013-08-02 DIAGNOSIS — D0460 Carcinoma in situ of skin of unspecified upper limb, including shoulder: Secondary | ICD-10-CM | POA: Diagnosis not present

## 2013-10-17 DIAGNOSIS — E785 Hyperlipidemia, unspecified: Secondary | ICD-10-CM | POA: Diagnosis not present

## 2013-10-17 DIAGNOSIS — Z940 Kidney transplant status: Secondary | ICD-10-CM | POA: Diagnosis not present

## 2013-10-17 DIAGNOSIS — E119 Type 2 diabetes mellitus without complications: Secondary | ICD-10-CM | POA: Diagnosis not present

## 2013-10-18 DIAGNOSIS — I129 Hypertensive chronic kidney disease with stage 1 through stage 4 chronic kidney disease, or unspecified chronic kidney disease: Secondary | ICD-10-CM | POA: Diagnosis not present

## 2013-10-18 DIAGNOSIS — E876 Hypokalemia: Secondary | ICD-10-CM | POA: Diagnosis not present

## 2013-10-18 DIAGNOSIS — E119 Type 2 diabetes mellitus without complications: Secondary | ICD-10-CM | POA: Diagnosis not present

## 2013-10-18 DIAGNOSIS — Z940 Kidney transplant status: Secondary | ICD-10-CM | POA: Diagnosis not present

## 2013-11-10 DIAGNOSIS — Z940 Kidney transplant status: Secondary | ICD-10-CM | POA: Diagnosis not present

## 2013-12-26 DIAGNOSIS — R059 Cough, unspecified: Secondary | ICD-10-CM | POA: Diagnosis not present

## 2013-12-26 DIAGNOSIS — Z6828 Body mass index (BMI) 28.0-28.9, adult: Secondary | ICD-10-CM | POA: Diagnosis not present

## 2013-12-26 DIAGNOSIS — R05 Cough: Secondary | ICD-10-CM | POA: Diagnosis not present

## 2013-12-26 DIAGNOSIS — J069 Acute upper respiratory infection, unspecified: Secondary | ICD-10-CM | POA: Diagnosis not present

## 2013-12-26 DIAGNOSIS — D899 Disorder involving the immune mechanism, unspecified: Secondary | ICD-10-CM | POA: Diagnosis not present

## 2014-01-30 DIAGNOSIS — E119 Type 2 diabetes mellitus without complications: Secondary | ICD-10-CM | POA: Diagnosis not present

## 2014-02-16 DIAGNOSIS — D899 Disorder involving the immune mechanism, unspecified: Secondary | ICD-10-CM | POA: Diagnosis not present

## 2014-02-16 DIAGNOSIS — Z940 Kidney transplant status: Secondary | ICD-10-CM | POA: Diagnosis not present

## 2014-02-16 DIAGNOSIS — I129 Hypertensive chronic kidney disease with stage 1 through stage 4 chronic kidney disease, or unspecified chronic kidney disease: Secondary | ICD-10-CM | POA: Diagnosis not present

## 2014-02-16 DIAGNOSIS — E876 Hypokalemia: Secondary | ICD-10-CM | POA: Diagnosis not present

## 2014-02-21 DIAGNOSIS — E119 Type 2 diabetes mellitus without complications: Secondary | ICD-10-CM | POA: Diagnosis not present

## 2014-02-21 DIAGNOSIS — E785 Hyperlipidemia, unspecified: Secondary | ICD-10-CM | POA: Diagnosis not present

## 2014-02-21 DIAGNOSIS — Z940 Kidney transplant status: Secondary | ICD-10-CM | POA: Diagnosis not present

## 2014-04-11 DIAGNOSIS — M109 Gout, unspecified: Secondary | ICD-10-CM | POA: Diagnosis not present

## 2014-04-11 DIAGNOSIS — E049 Nontoxic goiter, unspecified: Secondary | ICD-10-CM | POA: Diagnosis not present

## 2014-04-11 DIAGNOSIS — E119 Type 2 diabetes mellitus without complications: Secondary | ICD-10-CM | POA: Diagnosis not present

## 2014-04-11 DIAGNOSIS — E559 Vitamin D deficiency, unspecified: Secondary | ICD-10-CM | POA: Diagnosis not present

## 2014-04-11 DIAGNOSIS — E785 Hyperlipidemia, unspecified: Secondary | ICD-10-CM | POA: Diagnosis not present

## 2014-04-11 DIAGNOSIS — I10 Essential (primary) hypertension: Secondary | ICD-10-CM | POA: Diagnosis not present

## 2014-04-26 DIAGNOSIS — N186 End stage renal disease: Secondary | ICD-10-CM | POA: Diagnosis not present

## 2014-04-26 DIAGNOSIS — Z1231 Encounter for screening mammogram for malignant neoplasm of breast: Secondary | ICD-10-CM | POA: Diagnosis not present

## 2014-04-26 DIAGNOSIS — Z23 Encounter for immunization: Secondary | ICD-10-CM | POA: Diagnosis not present

## 2014-04-26 DIAGNOSIS — Z1389 Encounter for screening for other disorder: Secondary | ICD-10-CM | POA: Diagnosis not present

## 2014-04-26 DIAGNOSIS — Z008 Encounter for other general examination: Secondary | ICD-10-CM | POA: Diagnosis not present

## 2014-04-26 DIAGNOSIS — Z01419 Encounter for gynecological examination (general) (routine) without abnormal findings: Secondary | ICD-10-CM | POA: Diagnosis not present

## 2014-04-26 DIAGNOSIS — M109 Gout, unspecified: Secondary | ICD-10-CM | POA: Diagnosis not present

## 2014-04-26 DIAGNOSIS — E785 Hyperlipidemia, unspecified: Secondary | ICD-10-CM | POA: Diagnosis not present

## 2014-04-26 DIAGNOSIS — I10 Essential (primary) hypertension: Secondary | ICD-10-CM | POA: Diagnosis not present

## 2014-04-26 DIAGNOSIS — E119 Type 2 diabetes mellitus without complications: Secondary | ICD-10-CM | POA: Diagnosis not present

## 2014-04-27 DIAGNOSIS — I129 Hypertensive chronic kidney disease with stage 1 through stage 4 chronic kidney disease, or unspecified chronic kidney disease: Secondary | ICD-10-CM | POA: Diagnosis not present

## 2014-04-27 DIAGNOSIS — Z940 Kidney transplant status: Secondary | ICD-10-CM | POA: Diagnosis not present

## 2014-04-27 DIAGNOSIS — E119 Type 2 diabetes mellitus without complications: Secondary | ICD-10-CM | POA: Diagnosis not present

## 2014-04-27 DIAGNOSIS — D899 Disorder involving the immune mechanism, unspecified: Secondary | ICD-10-CM | POA: Diagnosis not present

## 2014-07-24 DIAGNOSIS — Z940 Kidney transplant status: Secondary | ICD-10-CM | POA: Diagnosis not present

## 2014-07-24 DIAGNOSIS — M109 Gout, unspecified: Secondary | ICD-10-CM | POA: Diagnosis not present

## 2014-07-24 DIAGNOSIS — E119 Type 2 diabetes mellitus without complications: Secondary | ICD-10-CM | POA: Diagnosis not present

## 2014-07-24 DIAGNOSIS — E785 Hyperlipidemia, unspecified: Secondary | ICD-10-CM | POA: Diagnosis not present

## 2014-08-15 DIAGNOSIS — Z940 Kidney transplant status: Secondary | ICD-10-CM | POA: Diagnosis not present

## 2014-08-15 DIAGNOSIS — E876 Hypokalemia: Secondary | ICD-10-CM | POA: Diagnosis not present

## 2014-08-15 DIAGNOSIS — I129 Hypertensive chronic kidney disease with stage 1 through stage 4 chronic kidney disease, or unspecified chronic kidney disease: Secondary | ICD-10-CM | POA: Diagnosis not present

## 2014-08-15 DIAGNOSIS — D899 Disorder involving the immune mechanism, unspecified: Secondary | ICD-10-CM | POA: Diagnosis not present

## 2014-08-29 DIAGNOSIS — Z940 Kidney transplant status: Secondary | ICD-10-CM | POA: Diagnosis not present

## 2014-08-29 DIAGNOSIS — E119 Type 2 diabetes mellitus without complications: Secondary | ICD-10-CM | POA: Diagnosis not present

## 2014-08-29 DIAGNOSIS — I10 Essential (primary) hypertension: Secondary | ICD-10-CM | POA: Diagnosis not present

## 2014-08-29 DIAGNOSIS — M109 Gout, unspecified: Secondary | ICD-10-CM | POA: Diagnosis not present

## 2014-08-29 DIAGNOSIS — Z6828 Body mass index (BMI) 28.0-28.9, adult: Secondary | ICD-10-CM | POA: Diagnosis not present

## 2014-08-29 DIAGNOSIS — M8580 Other specified disorders of bone density and structure, unspecified site: Secondary | ICD-10-CM | POA: Diagnosis not present

## 2014-08-29 DIAGNOSIS — E785 Hyperlipidemia, unspecified: Secondary | ICD-10-CM | POA: Diagnosis not present

## 2014-08-29 DIAGNOSIS — M859 Disorder of bone density and structure, unspecified: Secondary | ICD-10-CM | POA: Diagnosis not present

## 2014-08-29 DIAGNOSIS — N186 End stage renal disease: Secondary | ICD-10-CM | POA: Diagnosis not present

## 2014-12-06 DIAGNOSIS — Z940 Kidney transplant status: Secondary | ICD-10-CM | POA: Diagnosis not present

## 2014-12-06 DIAGNOSIS — E119 Type 2 diabetes mellitus without complications: Secondary | ICD-10-CM | POA: Diagnosis not present

## 2014-12-06 DIAGNOSIS — E785 Hyperlipidemia, unspecified: Secondary | ICD-10-CM | POA: Diagnosis not present

## 2014-12-08 DIAGNOSIS — E876 Hypokalemia: Secondary | ICD-10-CM | POA: Diagnosis not present

## 2014-12-08 DIAGNOSIS — E119 Type 2 diabetes mellitus without complications: Secondary | ICD-10-CM | POA: Diagnosis not present

## 2014-12-08 DIAGNOSIS — I129 Hypertensive chronic kidney disease with stage 1 through stage 4 chronic kidney disease, or unspecified chronic kidney disease: Secondary | ICD-10-CM | POA: Diagnosis not present

## 2014-12-08 DIAGNOSIS — Z940 Kidney transplant status: Secondary | ICD-10-CM | POA: Diagnosis not present

## 2014-12-11 DIAGNOSIS — Z940 Kidney transplant status: Secondary | ICD-10-CM | POA: Diagnosis not present

## 2014-12-12 DIAGNOSIS — N390 Urinary tract infection, site not specified: Secondary | ICD-10-CM | POA: Diagnosis not present

## 2014-12-18 DIAGNOSIS — Z940 Kidney transplant status: Secondary | ICD-10-CM | POA: Diagnosis not present

## 2015-01-02 DIAGNOSIS — Z6827 Body mass index (BMI) 27.0-27.9, adult: Secondary | ICD-10-CM | POA: Diagnosis not present

## 2015-01-02 DIAGNOSIS — Z940 Kidney transplant status: Secondary | ICD-10-CM | POA: Diagnosis not present

## 2015-01-02 DIAGNOSIS — M8580 Other specified disorders of bone density and structure, unspecified site: Secondary | ICD-10-CM | POA: Diagnosis not present

## 2015-01-02 DIAGNOSIS — D849 Immunodeficiency, unspecified: Secondary | ICD-10-CM | POA: Diagnosis not present

## 2015-01-02 DIAGNOSIS — M222X9 Patellofemoral disorders, unspecified knee: Secondary | ICD-10-CM | POA: Diagnosis not present

## 2015-01-02 DIAGNOSIS — M109 Gout, unspecified: Secondary | ICD-10-CM | POA: Diagnosis not present

## 2015-01-02 DIAGNOSIS — E119 Type 2 diabetes mellitus without complications: Secondary | ICD-10-CM | POA: Diagnosis not present

## 2015-01-02 DIAGNOSIS — Z1389 Encounter for screening for other disorder: Secondary | ICD-10-CM | POA: Diagnosis not present

## 2015-01-02 DIAGNOSIS — I10 Essential (primary) hypertension: Secondary | ICD-10-CM | POA: Diagnosis not present

## 2015-01-02 DIAGNOSIS — E785 Hyperlipidemia, unspecified: Secondary | ICD-10-CM | POA: Diagnosis not present

## 2015-01-03 DIAGNOSIS — N186 End stage renal disease: Secondary | ICD-10-CM | POA: Diagnosis not present

## 2015-02-05 DIAGNOSIS — H1131 Conjunctival hemorrhage, right eye: Secondary | ICD-10-CM | POA: Diagnosis not present

## 2015-02-05 DIAGNOSIS — E119 Type 2 diabetes mellitus without complications: Secondary | ICD-10-CM | POA: Diagnosis not present

## 2015-04-19 DIAGNOSIS — Z9189 Other specified personal risk factors, not elsewhere classified: Secondary | ICD-10-CM | POA: Diagnosis not present

## 2015-04-19 DIAGNOSIS — Z940 Kidney transplant status: Secondary | ICD-10-CM | POA: Diagnosis not present

## 2015-04-19 DIAGNOSIS — Z23 Encounter for immunization: Secondary | ICD-10-CM | POA: Diagnosis not present

## 2015-04-19 DIAGNOSIS — E876 Hypokalemia: Secondary | ICD-10-CM | POA: Diagnosis not present

## 2015-04-19 DIAGNOSIS — Z794 Long term (current) use of insulin: Secondary | ICD-10-CM | POA: Diagnosis not present

## 2015-04-19 DIAGNOSIS — M109 Gout, unspecified: Secondary | ICD-10-CM | POA: Diagnosis not present

## 2015-04-19 DIAGNOSIS — D899 Disorder involving the immune mechanism, unspecified: Secondary | ICD-10-CM | POA: Diagnosis not present

## 2015-04-19 DIAGNOSIS — E785 Hyperlipidemia, unspecified: Secondary | ICD-10-CM | POA: Diagnosis not present

## 2015-04-19 DIAGNOSIS — I129 Hypertensive chronic kidney disease with stage 1 through stage 4 chronic kidney disease, or unspecified chronic kidney disease: Secondary | ICD-10-CM | POA: Diagnosis not present

## 2015-04-19 DIAGNOSIS — E119 Type 2 diabetes mellitus without complications: Secondary | ICD-10-CM | POA: Diagnosis not present

## 2015-04-24 DIAGNOSIS — R8290 Unspecified abnormal findings in urine: Secondary | ICD-10-CM | POA: Diagnosis not present

## 2015-04-24 DIAGNOSIS — E119 Type 2 diabetes mellitus without complications: Secondary | ICD-10-CM | POA: Diagnosis not present

## 2015-04-24 DIAGNOSIS — E559 Vitamin D deficiency, unspecified: Secondary | ICD-10-CM | POA: Diagnosis not present

## 2015-04-24 DIAGNOSIS — I10 Essential (primary) hypertension: Secondary | ICD-10-CM | POA: Diagnosis not present

## 2015-04-24 DIAGNOSIS — E049 Nontoxic goiter, unspecified: Secondary | ICD-10-CM | POA: Diagnosis not present

## 2015-04-24 DIAGNOSIS — M109 Gout, unspecified: Secondary | ICD-10-CM | POA: Diagnosis not present

## 2015-04-24 DIAGNOSIS — E785 Hyperlipidemia, unspecified: Secondary | ICD-10-CM | POA: Diagnosis not present

## 2015-04-26 DIAGNOSIS — Z940 Kidney transplant status: Secondary | ICD-10-CM | POA: Diagnosis not present

## 2015-05-01 DIAGNOSIS — E049 Nontoxic goiter, unspecified: Secondary | ICD-10-CM | POA: Diagnosis not present

## 2015-05-01 DIAGNOSIS — Z0000 Encounter for general adult medical examination without abnormal findings: Secondary | ICD-10-CM | POA: Diagnosis not present

## 2015-05-01 DIAGNOSIS — L989 Disorder of the skin and subcutaneous tissue, unspecified: Secondary | ICD-10-CM | POA: Diagnosis not present

## 2015-05-01 DIAGNOSIS — E785 Hyperlipidemia, unspecified: Secondary | ICD-10-CM | POA: Diagnosis not present

## 2015-05-01 DIAGNOSIS — E119 Type 2 diabetes mellitus without complications: Secondary | ICD-10-CM | POA: Diagnosis not present

## 2015-05-01 DIAGNOSIS — Z1389 Encounter for screening for other disorder: Secondary | ICD-10-CM | POA: Diagnosis not present

## 2015-05-01 DIAGNOSIS — Z6827 Body mass index (BMI) 27.0-27.9, adult: Secondary | ICD-10-CM | POA: Diagnosis not present

## 2015-05-01 DIAGNOSIS — I10 Essential (primary) hypertension: Secondary | ICD-10-CM | POA: Diagnosis not present

## 2015-05-01 DIAGNOSIS — M109 Gout, unspecified: Secondary | ICD-10-CM | POA: Diagnosis not present

## 2015-05-01 DIAGNOSIS — Z940 Kidney transplant status: Secondary | ICD-10-CM | POA: Diagnosis not present

## 2015-05-01 DIAGNOSIS — E559 Vitamin D deficiency, unspecified: Secondary | ICD-10-CM | POA: Diagnosis not present

## 2015-05-16 DIAGNOSIS — Z779 Other contact with and (suspected) exposures hazardous to health: Secondary | ICD-10-CM | POA: Diagnosis not present

## 2015-05-16 DIAGNOSIS — Z124 Encounter for screening for malignant neoplasm of cervix: Secondary | ICD-10-CM | POA: Diagnosis not present

## 2015-05-16 DIAGNOSIS — Z6827 Body mass index (BMI) 27.0-27.9, adult: Secondary | ICD-10-CM | POA: Diagnosis not present

## 2015-05-16 DIAGNOSIS — Z1231 Encounter for screening mammogram for malignant neoplasm of breast: Secondary | ICD-10-CM | POA: Diagnosis not present

## 2015-06-12 DIAGNOSIS — R87612 Low grade squamous intraepithelial lesion on cytologic smear of cervix (LGSIL): Secondary | ICD-10-CM | POA: Diagnosis not present

## 2015-06-12 DIAGNOSIS — N870 Mild cervical dysplasia: Secondary | ICD-10-CM | POA: Diagnosis not present

## 2015-06-28 DIAGNOSIS — B078 Other viral warts: Secondary | ICD-10-CM | POA: Diagnosis not present

## 2015-06-28 DIAGNOSIS — Z85828 Personal history of other malignant neoplasm of skin: Secondary | ICD-10-CM | POA: Diagnosis not present

## 2015-07-05 DIAGNOSIS — Z13820 Encounter for screening for osteoporosis: Secondary | ICD-10-CM | POA: Diagnosis not present

## 2015-07-05 DIAGNOSIS — N958 Other specified menopausal and perimenopausal disorders: Secondary | ICD-10-CM | POA: Diagnosis not present

## 2015-08-03 DIAGNOSIS — E048 Other specified nontoxic goiter: Secondary | ICD-10-CM | POA: Diagnosis not present

## 2015-08-03 DIAGNOSIS — E119 Type 2 diabetes mellitus without complications: Secondary | ICD-10-CM | POA: Diagnosis not present

## 2015-08-03 DIAGNOSIS — Z940 Kidney transplant status: Secondary | ICD-10-CM | POA: Diagnosis not present

## 2015-08-08 DIAGNOSIS — I129 Hypertensive chronic kidney disease with stage 1 through stage 4 chronic kidney disease, or unspecified chronic kidney disease: Secondary | ICD-10-CM | POA: Diagnosis not present

## 2015-08-08 DIAGNOSIS — Z9189 Other specified personal risk factors, not elsewhere classified: Secondary | ICD-10-CM | POA: Diagnosis not present

## 2015-08-08 DIAGNOSIS — Z940 Kidney transplant status: Secondary | ICD-10-CM | POA: Diagnosis not present

## 2015-08-08 DIAGNOSIS — E119 Type 2 diabetes mellitus without complications: Secondary | ICD-10-CM | POA: Diagnosis not present

## 2015-08-08 DIAGNOSIS — E876 Hypokalemia: Secondary | ICD-10-CM | POA: Diagnosis not present

## 2015-08-08 DIAGNOSIS — E785 Hyperlipidemia, unspecified: Secondary | ICD-10-CM | POA: Diagnosis not present

## 2015-08-08 DIAGNOSIS — D899 Disorder involving the immune mechanism, unspecified: Secondary | ICD-10-CM | POA: Diagnosis not present

## 2015-08-08 DIAGNOSIS — M109 Gout, unspecified: Secondary | ICD-10-CM | POA: Diagnosis not present

## 2015-08-08 DIAGNOSIS — Z794 Long term (current) use of insulin: Secondary | ICD-10-CM | POA: Diagnosis not present

## 2015-08-21 DIAGNOSIS — D899 Disorder involving the immune mechanism, unspecified: Secondary | ICD-10-CM | POA: Diagnosis not present

## 2015-08-21 DIAGNOSIS — N186 End stage renal disease: Secondary | ICD-10-CM | POA: Diagnosis not present

## 2015-08-21 DIAGNOSIS — E785 Hyperlipidemia, unspecified: Secondary | ICD-10-CM | POA: Insufficient documentation

## 2015-08-21 DIAGNOSIS — Z794 Long term (current) use of insulin: Secondary | ICD-10-CM | POA: Diagnosis not present

## 2015-08-21 DIAGNOSIS — D849 Immunodeficiency, unspecified: Secondary | ICD-10-CM | POA: Insufficient documentation

## 2015-08-21 DIAGNOSIS — E1322 Other specified diabetes mellitus with diabetic chronic kidney disease: Secondary | ICD-10-CM | POA: Diagnosis not present

## 2015-08-21 DIAGNOSIS — E876 Hypokalemia: Secondary | ICD-10-CM | POA: Diagnosis not present

## 2015-08-21 DIAGNOSIS — N2581 Secondary hyperparathyroidism of renal origin: Secondary | ICD-10-CM | POA: Insufficient documentation

## 2015-08-21 DIAGNOSIS — D8989 Other specified disorders involving the immune mechanism, not elsewhere classified: Secondary | ICD-10-CM | POA: Diagnosis not present

## 2015-08-21 DIAGNOSIS — Z940 Kidney transplant status: Secondary | ICD-10-CM | POA: Insufficient documentation

## 2015-08-21 DIAGNOSIS — Z7982 Long term (current) use of aspirin: Secondary | ICD-10-CM | POA: Diagnosis not present

## 2015-08-21 DIAGNOSIS — I120 Hypertensive chronic kidney disease with stage 5 chronic kidney disease or end stage renal disease: Secondary | ICD-10-CM | POA: Diagnosis not present

## 2015-08-21 DIAGNOSIS — E139 Other specified diabetes mellitus without complications: Secondary | ICD-10-CM | POA: Diagnosis not present

## 2015-08-21 DIAGNOSIS — D649 Anemia, unspecified: Secondary | ICD-10-CM | POA: Insufficient documentation

## 2015-08-21 DIAGNOSIS — Z79899 Other long term (current) drug therapy: Secondary | ICD-10-CM | POA: Diagnosis not present

## 2015-08-21 DIAGNOSIS — Z4822 Encounter for aftercare following kidney transplant: Secondary | ICD-10-CM | POA: Diagnosis not present

## 2015-08-21 DIAGNOSIS — M109 Gout, unspecified: Secondary | ICD-10-CM | POA: Diagnosis not present

## 2015-08-21 DIAGNOSIS — I10 Essential (primary) hypertension: Secondary | ICD-10-CM | POA: Insufficient documentation

## 2015-08-24 DIAGNOSIS — Z79899 Other long term (current) drug therapy: Secondary | ICD-10-CM | POA: Diagnosis not present

## 2015-08-24 DIAGNOSIS — Z940 Kidney transplant status: Secondary | ICD-10-CM | POA: Diagnosis not present

## 2015-08-28 DIAGNOSIS — Z01818 Encounter for other preprocedural examination: Secondary | ICD-10-CM | POA: Insufficient documentation

## 2015-08-28 DIAGNOSIS — Z7689 Persons encountering health services in other specified circumstances: Secondary | ICD-10-CM | POA: Insufficient documentation

## 2015-08-29 DIAGNOSIS — Z794 Long term (current) use of insulin: Secondary | ICD-10-CM | POA: Diagnosis not present

## 2015-08-29 DIAGNOSIS — D899 Disorder involving the immune mechanism, unspecified: Secondary | ICD-10-CM | POA: Diagnosis not present

## 2015-08-29 DIAGNOSIS — Z79899 Other long term (current) drug therapy: Secondary | ICD-10-CM | POA: Diagnosis not present

## 2015-08-29 DIAGNOSIS — E139 Other specified diabetes mellitus without complications: Secondary | ICD-10-CM | POA: Diagnosis not present

## 2015-08-29 DIAGNOSIS — Z4822 Encounter for aftercare following kidney transplant: Secondary | ICD-10-CM | POA: Diagnosis not present

## 2015-08-29 DIAGNOSIS — Z7952 Long term (current) use of systemic steroids: Secondary | ICD-10-CM | POA: Diagnosis not present

## 2015-08-29 DIAGNOSIS — Z5181 Encounter for therapeutic drug level monitoring: Secondary | ICD-10-CM | POA: Diagnosis not present

## 2015-08-29 DIAGNOSIS — B349 Viral infection, unspecified: Secondary | ICD-10-CM | POA: Diagnosis not present

## 2015-08-29 DIAGNOSIS — Z940 Kidney transplant status: Secondary | ICD-10-CM | POA: Diagnosis not present

## 2015-09-05 DIAGNOSIS — Z4822 Encounter for aftercare following kidney transplant: Secondary | ICD-10-CM | POA: Diagnosis not present

## 2015-09-05 DIAGNOSIS — D899 Disorder involving the immune mechanism, unspecified: Secondary | ICD-10-CM | POA: Diagnosis not present

## 2015-09-05 DIAGNOSIS — E785 Hyperlipidemia, unspecified: Secondary | ICD-10-CM | POA: Diagnosis not present

## 2015-09-05 DIAGNOSIS — R809 Proteinuria, unspecified: Secondary | ICD-10-CM | POA: Diagnosis not present

## 2015-09-05 DIAGNOSIS — Z79899 Other long term (current) drug therapy: Secondary | ICD-10-CM | POA: Diagnosis not present

## 2015-09-05 DIAGNOSIS — Z940 Kidney transplant status: Secondary | ICD-10-CM | POA: Diagnosis not present

## 2015-09-05 DIAGNOSIS — E139 Other specified diabetes mellitus without complications: Secondary | ICD-10-CM | POA: Diagnosis not present

## 2015-09-05 DIAGNOSIS — N2581 Secondary hyperparathyroidism of renal origin: Secondary | ICD-10-CM | POA: Diagnosis not present

## 2015-09-05 DIAGNOSIS — Z7952 Long term (current) use of systemic steroids: Secondary | ICD-10-CM | POA: Diagnosis not present

## 2015-09-05 DIAGNOSIS — N186 End stage renal disease: Secondary | ICD-10-CM | POA: Diagnosis not present

## 2015-09-05 DIAGNOSIS — D8989 Other specified disorders involving the immune mechanism, not elsewhere classified: Secondary | ICD-10-CM | POA: Diagnosis not present

## 2015-09-05 DIAGNOSIS — E1122 Type 2 diabetes mellitus with diabetic chronic kidney disease: Secondary | ICD-10-CM | POA: Diagnosis not present

## 2015-09-05 DIAGNOSIS — Z7982 Long term (current) use of aspirin: Secondary | ICD-10-CM | POA: Diagnosis not present

## 2015-09-05 DIAGNOSIS — R7989 Other specified abnormal findings of blood chemistry: Secondary | ICD-10-CM | POA: Diagnosis not present

## 2015-09-05 DIAGNOSIS — Z794 Long term (current) use of insulin: Secondary | ICD-10-CM | POA: Diagnosis not present

## 2015-09-05 DIAGNOSIS — I120 Hypertensive chronic kidney disease with stage 5 chronic kidney disease or end stage renal disease: Secondary | ICD-10-CM | POA: Diagnosis not present

## 2015-09-05 DIAGNOSIS — I10 Essential (primary) hypertension: Secondary | ICD-10-CM | POA: Diagnosis not present

## 2015-09-05 DIAGNOSIS — M109 Gout, unspecified: Secondary | ICD-10-CM | POA: Diagnosis not present

## 2015-09-05 DIAGNOSIS — E876 Hypokalemia: Secondary | ICD-10-CM | POA: Diagnosis not present

## 2015-09-19 DIAGNOSIS — R809 Proteinuria, unspecified: Secondary | ICD-10-CM | POA: Diagnosis not present

## 2015-09-19 DIAGNOSIS — Z4822 Encounter for aftercare following kidney transplant: Secondary | ICD-10-CM | POA: Diagnosis not present

## 2015-09-19 DIAGNOSIS — D899 Disorder involving the immune mechanism, unspecified: Secondary | ICD-10-CM | POA: Diagnosis not present

## 2015-09-19 DIAGNOSIS — E139 Other specified diabetes mellitus without complications: Secondary | ICD-10-CM | POA: Diagnosis not present

## 2015-09-19 DIAGNOSIS — I120 Hypertensive chronic kidney disease with stage 5 chronic kidney disease or end stage renal disease: Secondary | ICD-10-CM | POA: Diagnosis not present

## 2015-09-19 DIAGNOSIS — E1122 Type 2 diabetes mellitus with diabetic chronic kidney disease: Secondary | ICD-10-CM | POA: Diagnosis not present

## 2015-09-19 DIAGNOSIS — Z79899 Other long term (current) drug therapy: Secondary | ICD-10-CM | POA: Diagnosis not present

## 2015-09-19 DIAGNOSIS — Z794 Long term (current) use of insulin: Secondary | ICD-10-CM | POA: Diagnosis not present

## 2015-09-19 DIAGNOSIS — I10 Essential (primary) hypertension: Secondary | ICD-10-CM | POA: Diagnosis not present

## 2015-09-19 DIAGNOSIS — N186 End stage renal disease: Secondary | ICD-10-CM | POA: Diagnosis not present

## 2015-09-19 DIAGNOSIS — Z940 Kidney transplant status: Secondary | ICD-10-CM | POA: Diagnosis not present

## 2015-09-19 DIAGNOSIS — E876 Hypokalemia: Secondary | ICD-10-CM | POA: Diagnosis not present

## 2015-10-29 ENCOUNTER — Encounter: Payer: Self-pay | Admitting: Gastroenterology

## 2015-11-01 DIAGNOSIS — I10 Essential (primary) hypertension: Secondary | ICD-10-CM | POA: Diagnosis not present

## 2015-11-01 DIAGNOSIS — E119 Type 2 diabetes mellitus without complications: Secondary | ICD-10-CM | POA: Diagnosis not present

## 2015-11-01 DIAGNOSIS — M109 Gout, unspecified: Secondary | ICD-10-CM | POA: Diagnosis not present

## 2015-11-01 DIAGNOSIS — E784 Other hyperlipidemia: Secondary | ICD-10-CM | POA: Diagnosis not present

## 2015-11-01 DIAGNOSIS — D848 Other specified immunodeficiencies: Secondary | ICD-10-CM | POA: Diagnosis not present

## 2015-11-01 DIAGNOSIS — Z6826 Body mass index (BMI) 26.0-26.9, adult: Secondary | ICD-10-CM | POA: Diagnosis not present

## 2015-11-01 DIAGNOSIS — Z940 Kidney transplant status: Secondary | ICD-10-CM | POA: Diagnosis not present

## 2015-12-04 DIAGNOSIS — E784 Other hyperlipidemia: Secondary | ICD-10-CM | POA: Diagnosis not present

## 2015-12-04 DIAGNOSIS — Z940 Kidney transplant status: Secondary | ICD-10-CM | POA: Diagnosis not present

## 2015-12-04 DIAGNOSIS — M109 Gout, unspecified: Secondary | ICD-10-CM | POA: Diagnosis not present

## 2015-12-04 DIAGNOSIS — E1129 Type 2 diabetes mellitus with other diabetic kidney complication: Secondary | ICD-10-CM | POA: Diagnosis not present

## 2015-12-07 DIAGNOSIS — Z6826 Body mass index (BMI) 26.0-26.9, adult: Secondary | ICD-10-CM | POA: Diagnosis not present

## 2015-12-07 DIAGNOSIS — D849 Immunodeficiency, unspecified: Secondary | ICD-10-CM | POA: Diagnosis not present

## 2015-12-07 DIAGNOSIS — N186 End stage renal disease: Secondary | ICD-10-CM | POA: Diagnosis not present

## 2015-12-07 DIAGNOSIS — A630 Anogenital (venereal) warts: Secondary | ICD-10-CM | POA: Diagnosis not present

## 2015-12-07 DIAGNOSIS — D6489 Other specified anemias: Secondary | ICD-10-CM | POA: Diagnosis not present

## 2015-12-07 DIAGNOSIS — K921 Melena: Secondary | ICD-10-CM | POA: Diagnosis not present

## 2015-12-10 DIAGNOSIS — D6489 Other specified anemias: Secondary | ICD-10-CM | POA: Diagnosis not present

## 2015-12-10 DIAGNOSIS — D649 Anemia, unspecified: Secondary | ICD-10-CM | POA: Diagnosis not present

## 2015-12-19 DIAGNOSIS — M109 Gout, unspecified: Secondary | ICD-10-CM | POA: Diagnosis not present

## 2015-12-19 DIAGNOSIS — E119 Type 2 diabetes mellitus without complications: Secondary | ICD-10-CM | POA: Diagnosis not present

## 2015-12-19 DIAGNOSIS — E663 Overweight: Secondary | ICD-10-CM | POA: Diagnosis not present

## 2015-12-19 DIAGNOSIS — E876 Hypokalemia: Secondary | ICD-10-CM | POA: Diagnosis not present

## 2015-12-19 DIAGNOSIS — Z794 Long term (current) use of insulin: Secondary | ICD-10-CM | POA: Diagnosis not present

## 2015-12-19 DIAGNOSIS — D899 Disorder involving the immune mechanism, unspecified: Secondary | ICD-10-CM | POA: Diagnosis not present

## 2015-12-19 DIAGNOSIS — Z940 Kidney transplant status: Secondary | ICD-10-CM | POA: Diagnosis not present

## 2015-12-19 DIAGNOSIS — Z9189 Other specified personal risk factors, not elsewhere classified: Secondary | ICD-10-CM | POA: Diagnosis not present

## 2015-12-19 DIAGNOSIS — E785 Hyperlipidemia, unspecified: Secondary | ICD-10-CM | POA: Diagnosis not present

## 2015-12-19 DIAGNOSIS — I129 Hypertensive chronic kidney disease with stage 1 through stage 4 chronic kidney disease, or unspecified chronic kidney disease: Secondary | ICD-10-CM | POA: Diagnosis not present

## 2015-12-19 LAB — HEMOCCULT SLIDES (X 3 CARDS): OCCULT 1: POSITIVE — AB

## 2015-12-28 DIAGNOSIS — A630 Anogenital (venereal) warts: Secondary | ICD-10-CM | POA: Diagnosis not present

## 2015-12-31 ENCOUNTER — Ambulatory Visit (INDEPENDENT_AMBULATORY_CARE_PROVIDER_SITE_OTHER): Payer: Medicare Other | Admitting: Gastroenterology

## 2015-12-31 ENCOUNTER — Encounter: Payer: Self-pay | Admitting: Gastroenterology

## 2015-12-31 VITALS — BP 158/62 | HR 80 | Ht 66.0 in | Wt 160.4 lb

## 2015-12-31 DIAGNOSIS — R195 Other fecal abnormalities: Secondary | ICD-10-CM

## 2015-12-31 DIAGNOSIS — D62 Acute posthemorrhagic anemia: Secondary | ICD-10-CM | POA: Diagnosis not present

## 2015-12-31 DIAGNOSIS — Z86010 Personal history of colonic polyps: Secondary | ICD-10-CM | POA: Diagnosis not present

## 2015-12-31 DIAGNOSIS — Z860101 Personal history of adenomatous and serrated colon polyps: Secondary | ICD-10-CM

## 2015-12-31 MED ORDER — NA SULFATE-K SULFATE-MG SULF 17.5-3.13-1.6 GM/177ML PO SOLN: 1.0000 | Freq: Once | ORAL | Status: DC

## 2015-12-31 NOTE — Patient Instructions (Signed)
You have been scheduled for a colonoscopy. Please follow written instructions given to you at your visit today.  Please pick up your prep supplies at the pharmacy within the next 1-3 days. If you use inhalers (even only as needed), please bring them with you on the day of your procedure. Your physician has requested that you go to www.startemmi.com and enter the access code given to you at your visit today. This web site gives a general overview about your procedure. However, you should still follow specific instructions given to you by our office regarding your preparation for the procedure.  Thank you for choosing me and [facility].  Malcolm T. Stark, Jr., MD., FACG  

## 2015-12-31 NOTE — Progress Notes (Signed)
History of Present Illness: This is a 60 year old female referred by Dr. Lang Snow for evaluation of Hemoccult-positive stool and anemia. Patient has a history of adenomatous colon polyps and last underwent colonoscopy in July 2014 with 5 small adenomatous colon polyps removed, mild sigmoid colon diverticulosis and internal hemorrhoids noted. She has chronic kidney disease and is status post renal transplant in 2010. She states her creatinine has risen slightly over the past few months-it is currently 2.6. She was found to have a normocytic anemia with hemoglobin of 10.2. Iron, B12 and folate studies were normal. Occult blood was noted on stool Hemoccult testing. Patient has not noted any GI symptoms. Denies weight loss, abdominal pain, constipation, diarrhea, change in stool caliber, melena, hematochezia, nausea, vomiting, dysphagia, reflux symptoms, chest pain.   No Known Allergies Outpatient Prescriptions Prior to Visit  Medication Sig Dispense Refill  . allopurinol (ZYLOPRIM) 100 MG tablet Take 100 mg by mouth daily.    Marland Kitchen amLODipine (NORVASC) 10 MG tablet Take 10 mg by mouth daily.    Marland Kitchen ezetimibe-simvastatin (VYTORIN) 10-20 MG per tablet Take 1 tablet by mouth at bedtime.    Marland Kitchen glipiZIDE (GLUCOTROL) 10 MG tablet Take 10 mg by mouth 2 (two) times daily before a meal.    . hydrochlorothiazide (HYDRODIURIL) 25 MG tablet Take 25 mg by mouth daily.    . insulin glargine (LANTUS) 100 UNIT/ML injection Inject 26 Units into the skin at bedtime.     Marland Kitchen labetalol (NORMODYNE) 200 MG tablet Take 300 mg by mouth 2 (two) times daily.    . mycophenolate (CELLCEPT) 250 MG capsule Take by mouth 2 (two) times daily.    . potassium chloride (K-DUR,KLOR-CON) 10 MEQ tablet Take 10 mEq by mouth daily.    . predniSONE (DELTASONE) 5 MG tablet Take 5 mg by mouth daily.    . ranitidine (ZANTAC) 150 MG capsule Take 150 mg by mouth every evening.    . tacrolimus (PROGRAF) 1 MG capsule Take 4 mg by mouth every morning.  3mg  every evening     No facility-administered medications prior to visit.   Past Medical History  Diagnosis Date  . Seasonal allergies   . Diabetes mellitus type 2, controlled ([place])   . Hyperlipidemia   . Hypertension   . Kidney disease   . Goiter   . Gout   . Osteopenia   . Vitamin B12 deficiency anemia due to intrinsic factor deficiency   . End stage renal disease ([place])   . Adenomatous colon polyp 08/2007   Past Surgical History  Procedure Laterality Date  . Kidney transplant  June 2010  . Ganglion cyst excision Right 1994   Social History   Social History  . Marital Status: Married    Spouse Name: N/A  . Number of Children: 3  . Years of Education: N/A   Social History Main Topics  . Smoking status: Former Smoker    Quit date: 07/09/1997  . Smokeless tobacco: Never Used  . Alcohol Use: No  . Drug Use: No  . Sexual Activity: Not Asked   Other Topics Concern  . None   Social History Narrative   Family History  Problem Relation Age of Onset  . Colon cancer Sister   . Colon cancer Brother      Physical Exam: General: Well developed, well nourished, no acute distress Head: Normocephalic and atraumatic Eyes:  sclerae anicteric, EOMI Ears: Normal auditory acuity Mouth: No deformity or lesions Lungs: Clear throughout to auscultation  Heart: Regular rate and rhythm; no murmurs, rubs or bruits Abdomen: Soft, non tender and non distended. No masses, hepatosplenomegaly or hernias noted. Normal Bowel sounds Rectal: deferred to colonoscopy Musculoskeletal: Symmetrical with no gross deformities  Pulses:  Normal pulses noted Extremities: No clubbing, cyanosis, edema or deformities noted Neurological: Alert oriented x 4, grossly nonfocal Psychological:  Alert and cooperative. Normal mood and affect  Assessment and Recommendations:  1. Occult blood in stool and personal history of adenomatous colon polyps due for a 3 year interval surveillance. Schedule  colonoscopy. The risks (including bleeding, perforation, infection, missed lesions, medication reactions and possible hospitalization or surgery if complications occur), benefits, and alternatives to colonoscopy with possible biopsy and possible polypectomy were discussed with the patient and they consent to proceed.   2. Normocytic anemia. Suspect anemia due to CKD. Iron, B12 and folate studies normal.

## 2016-01-08 DIAGNOSIS — R87612 Low grade squamous intraepithelial lesion on cytologic smear of cervix (LGSIL): Secondary | ICD-10-CM | POA: Diagnosis not present

## 2016-01-08 DIAGNOSIS — N871 Moderate cervical dysplasia: Secondary | ICD-10-CM | POA: Diagnosis not present

## 2016-01-11 DIAGNOSIS — N186 End stage renal disease: Secondary | ICD-10-CM | POA: Diagnosis not present

## 2016-01-11 DIAGNOSIS — R112 Nausea with vomiting, unspecified: Secondary | ICD-10-CM | POA: Diagnosis not present

## 2016-01-11 DIAGNOSIS — R0602 Shortness of breath: Secondary | ICD-10-CM | POA: Diagnosis not present

## 2016-01-11 DIAGNOSIS — R1013 Epigastric pain: Secondary | ICD-10-CM | POA: Diagnosis not present

## 2016-01-11 DIAGNOSIS — E784 Other hyperlipidemia: Secondary | ICD-10-CM | POA: Diagnosis not present

## 2016-01-11 DIAGNOSIS — Z6825 Body mass index (BMI) 25.0-25.9, adult: Secondary | ICD-10-CM | POA: Diagnosis not present

## 2016-01-11 DIAGNOSIS — D6489 Other specified anemias: Secondary | ICD-10-CM | POA: Diagnosis not present

## 2016-01-11 DIAGNOSIS — I10 Essential (primary) hypertension: Secondary | ICD-10-CM | POA: Diagnosis not present

## 2016-01-14 DIAGNOSIS — R197 Diarrhea, unspecified: Secondary | ICD-10-CM | POA: Diagnosis not present

## 2016-01-17 ENCOUNTER — Encounter: Payer: Self-pay | Admitting: *Deleted

## 2016-01-22 ENCOUNTER — Encounter: Payer: Self-pay | Admitting: Cardiology

## 2016-01-22 DIAGNOSIS — R195 Other fecal abnormalities: Secondary | ICD-10-CM | POA: Diagnosis not present

## 2016-01-22 DIAGNOSIS — D6489 Other specified anemias: Secondary | ICD-10-CM | POA: Diagnosis not present

## 2016-01-22 DIAGNOSIS — Z6825 Body mass index (BMI) 25.0-25.9, adult: Secondary | ICD-10-CM | POA: Diagnosis not present

## 2016-01-22 DIAGNOSIS — I517 Cardiomegaly: Secondary | ICD-10-CM | POA: Insufficient documentation

## 2016-01-22 DIAGNOSIS — N184 Chronic kidney disease, stage 4 (severe): Secondary | ICD-10-CM | POA: Diagnosis not present

## 2016-01-22 DIAGNOSIS — R0600 Dyspnea, unspecified: Secondary | ICD-10-CM | POA: Diagnosis not present

## 2016-01-23 ENCOUNTER — Other Ambulatory Visit (HOSPITAL_COMMUNITY): Payer: Self-pay | Admitting: Internal Medicine

## 2016-01-23 DIAGNOSIS — R0600 Dyspnea, unspecified: Secondary | ICD-10-CM

## 2016-01-25 ENCOUNTER — Ambulatory Visit (HOSPITAL_COMMUNITY)
Admission: RE | Admit: 2016-01-25 | Discharge: 2016-01-25 | Disposition: A | Discharge status: Home / Self Care | Payer: Medicare Other | Source: Ambulatory Visit | Attending: Internal Medicine | Admitting: Internal Medicine

## 2016-01-25 DIAGNOSIS — R0600 Dyspnea, unspecified: Secondary | ICD-10-CM | POA: Diagnosis not present

## 2016-01-25 DIAGNOSIS — E119 Type 2 diabetes mellitus without complications: Secondary | ICD-10-CM | POA: Diagnosis not present

## 2016-01-25 DIAGNOSIS — I371 Nonrheumatic pulmonary valve insufficiency: Secondary | ICD-10-CM | POA: Diagnosis not present

## 2016-01-25 DIAGNOSIS — I071 Rheumatic tricuspid insufficiency: Secondary | ICD-10-CM | POA: Diagnosis not present

## 2016-01-25 DIAGNOSIS — E785 Hyperlipidemia, unspecified: Secondary | ICD-10-CM | POA: Insufficient documentation

## 2016-01-25 DIAGNOSIS — Z87891 Personal history of nicotine dependence: Secondary | ICD-10-CM | POA: Diagnosis not present

## 2016-01-25 DIAGNOSIS — I119 Hypertensive heart disease without heart failure: Secondary | ICD-10-CM | POA: Diagnosis not present

## 2016-01-25 NOTE — Progress Notes (Signed)
  Echocardiogram 2D Echocardiogram has been performed.  Jennette Dubin 01/25/2016, 10:42 AM

## 2016-01-31 ENCOUNTER — Telehealth: Payer: Self-pay | Admitting: Gastroenterology

## 2016-01-31 DIAGNOSIS — R1011 Right upper quadrant pain: Secondary | ICD-10-CM

## 2016-01-31 DIAGNOSIS — R110 Nausea: Secondary | ICD-10-CM

## 2016-01-31 NOTE — Telephone Encounter (Signed)
Left message on machine to call back  

## 2016-02-04 ENCOUNTER — Ambulatory Visit (HOSPITAL_COMMUNITY)
Admission: RE | Admit: 2016-02-04 | Discharge: 2016-02-04 | Disposition: A | Discharge status: Home / Self Care | Payer: Medicare Other | Source: Ambulatory Visit | Attending: Nephrology | Admitting: Nephrology

## 2016-02-04 DIAGNOSIS — N184 Chronic kidney disease, stage 4 (severe): Secondary | ICD-10-CM | POA: Insufficient documentation

## 2016-02-04 DIAGNOSIS — D638 Anemia in other chronic diseases classified elsewhere: Secondary | ICD-10-CM | POA: Insufficient documentation

## 2016-02-04 LAB — POCT HEMOGLOBIN-HEMACUE: Hemoglobin: 8.8 g/dL — ABNORMAL LOW (ref 12.0–15.0)

## 2016-02-04 MED ORDER — EPOETIN ALFA 20000 UNIT/ML IJ SOLN
INTRAMUSCULAR | Status: AC
  Administered 2016-02-04: 20000 [IU] via SUBCUTANEOUS
  Filled 2016-02-04: qty 1

## 2016-02-04 MED ORDER — EPOETIN ALFA 20000 UNIT/ML IJ SOLN: 20000.0000 [IU] | Freq: Once | INTRAMUSCULAR | Status: AC

## 2016-02-04 NOTE — Telephone Encounter (Signed)
Left message for patient to call back  

## 2016-02-04 NOTE — Telephone Encounter (Signed)
Yes ok to add on EGD if ok with [facility]. EGD can be done on another day if unable to add at time of colonoscopy. Schedule RUQ [place] too.

## 2016-02-04 NOTE — Discharge Instructions (Signed)
Epoetin Alfa injection What is this medicine? EPOETIN ALFA (e POE e tin AL fa) helps your body make more red blood cells. This medicine is used to treat anemia caused by chronic kidney failure, cancer chemotherapy, or HIV-therapy. It may also be used before surgery if you have anemia. This medicine may be used for other purposes; ask your health care provider or pharmacist if you have questions. What should I tell my health care provider before I take this medicine? They need to know if you have any of these conditions: -blood clotting disorders -cancer patient not on chemotherapy -cystic fibrosis -heart disease, such as angina or heart failure -hemoglobin level of 12 g/dL or greater -high blood pressure -low levels of folate, iron, or vitamin B12 -seizures -an unusual or allergic reaction to erythropoietin, albumin, benzyl alcohol, hamster proteins, other medicines, foods, dyes, or preservatives -pregnant or trying to get pregnant -breast-feeding How should I use this medicine? This medicine is for injection into a vein or under the skin. It is usually given by a health care professional in a hospital or clinic setting. If you get this medicine at home, you will be taught how to prepare and give this medicine. Use exactly as directed. Take your medicine at regular intervals. Do not take your medicine more often than directed. It is important that you put your used needles and syringes in a special sharps container. Do not put them in a trash can. If you do not have a sharps container, call your pharmacist or healthcare provider to get one. Talk to your pediatrician regarding the use of this medicine in children. While this drug may be prescribed for selected conditions, precautions do apply. Overdosage: If you think you have taken too much of this medicine contact a poison control center or emergency room at once. NOTE: This medicine is only for you. Do not share this medicine with  others. What if I miss a dose? If you miss a dose, take it as soon as you can. If it is almost time for your next dose, take only that dose. Do not take double or extra doses. What may interact with this medicine? Do not take this medicine with any of the following medications: -darbepoetin alfa This list may not describe all possible interactions. Give your health care provider a list of all the medicines, herbs, non-prescription drugs, or dietary supplements you use. Also tell them if you smoke, drink alcohol, or use illegal drugs. Some items may interact with your medicine. What should I watch for while using this medicine? Visit your prescriber or health care professional for regular checks on your progress and for the needed blood tests and blood pressure measurements. It is especially important for the doctor to make sure your hemoglobin level is in the desired range, to limit the risk of potential side effects and to give you the best benefit. Keep all appointments for any recommended tests. Check your blood pressure as directed. Ask your doctor what your blood pressure should be and when you should contact him or her. As your body makes more red blood cells, you may need to take iron, folic acid, or vitamin B supplements. Ask your doctor or health care provider which products are right for you. If you have kidney disease continue dietary restrictions, even though this medication can make you feel better. Talk with your doctor or health care professional about the foods you eat and the vitamins that you take. What side effects may I notice   from receiving this medicine? Side effects that you should report to your doctor or health care professional as soon as possible: -allergic reactions like skin rash, itching or hives, swelling of the face, lips, or tongue -breathing problems -changes in vision -chest pain -confusion, trouble speaking or understanding -feeling faint or lightheaded,  falls -high blood pressure -muscle aches or pains -pain, swelling, warmth in the leg -rapid weight gain -severe headaches -sudden numbness or weakness of the face, arm or leg -trouble walking, dizziness, loss of balance or coordination -seizures (convulsions) -swelling of the ankles, feet, hands -unusually weak or tired Side effects that usually do not require medical attention (report to your doctor or health care professional if they continue or are bothersome): -diarrhea -fever, chills (flu-like symptoms) -headaches -nausea, vomiting -redness, stinging, or swelling at site where injected This list may not describe all possible side effects. Call your doctor for medical advice about side effects. You may report side effects to FDA at 1-800-FDA-1088. Where should I keep my medicine? Keep out of the reach of children. Store in a refrigerator between 2 and 8 degrees C (36 and 46 degrees F). Do not freeze or shake. Throw away any unused portion if using a single-dose vial. Multi-dose vials can be kept in the refrigerator for up to 21 days after the initial dose. Throw away unused medicine. NOTE: This sheet is a summary. It may not cover all possible information. If you have questions about this medicine, talk to your doctor, pharmacist, or health care provider.    2016, [employer]vier/[employer] Standard. (2008-05-23 10:25:44)  

## 2016-02-04 NOTE — Telephone Encounter (Signed)
Patient reports that she is having abdominal pain intermittently after a meal.  She had an episode of vomiting last week and pain. She states she has lingering pain for hours after a heavy/greasy meal.   She states that these symptoms have been present for about 3 months off and on.  She didn't relay any of this to you at the office visit on 12/31/15.  Dr. Brigitte Pulse advised her to inquire about adding on an EGD to her upcoming colonoscopy.  Please advise

## 2016-02-05 ENCOUNTER — Encounter: Payer: Self-pay | Admitting: Nephrology

## 2016-02-05 DIAGNOSIS — D509 Iron deficiency anemia, unspecified: Secondary | ICD-10-CM | POA: Insufficient documentation

## 2016-02-05 NOTE — Telephone Encounter (Signed)
EGD added on Left message for patient to call back

## 2016-02-06 ENCOUNTER — Ambulatory Visit: Payer: Medicare Other | Admitting: Cardiology

## 2016-02-07 NOTE — Telephone Encounter (Signed)
Patient notified of recommendations .  She is scheduled for [place] for 02/11/16 7:15 arrival for 7:30.  She is aware to be NPO after midnight.  EGD added to colonoscopy

## 2016-02-11 ENCOUNTER — Ambulatory Visit (HOSPITAL_COMMUNITY)
Admission: RE | Admit: 2016-02-11 | Discharge: 2016-02-11 | Disposition: A | Discharge status: Home / Self Care | Payer: Medicare Other | Source: Ambulatory Visit | Attending: Gastroenterology | Admitting: Gastroenterology

## 2016-02-11 DIAGNOSIS — K7689 Other specified diseases of liver: Secondary | ICD-10-CM | POA: Insufficient documentation

## 2016-02-11 DIAGNOSIS — R110 Nausea: Secondary | ICD-10-CM | POA: Diagnosis not present

## 2016-02-11 DIAGNOSIS — R93421 Abnormal radiologic findings on diagnostic imaging of right kidney: Secondary | ICD-10-CM | POA: Diagnosis not present

## 2016-02-11 DIAGNOSIS — R1011 Right upper quadrant pain: Secondary | ICD-10-CM | POA: Insufficient documentation

## 2016-02-11 DIAGNOSIS — K8012 Calculus of gallbladder with acute and chronic cholecystitis without obstruction: Secondary | ICD-10-CM | POA: Diagnosis not present

## 2016-02-11 DIAGNOSIS — R0600 Dyspnea, unspecified: Secondary | ICD-10-CM | POA: Insufficient documentation

## 2016-02-12 ENCOUNTER — Telehealth (HOSPITAL_COMMUNITY): Payer: Self-pay | Admitting: *Deleted

## 2016-02-12 ENCOUNTER — Encounter: Payer: Self-pay | Admitting: Interventional Cardiology

## 2016-02-12 ENCOUNTER — Ambulatory Visit (INDEPENDENT_AMBULATORY_CARE_PROVIDER_SITE_OTHER): Payer: Medicare Other | Admitting: Interventional Cardiology

## 2016-02-12 ENCOUNTER — Encounter (INDEPENDENT_AMBULATORY_CARE_PROVIDER_SITE_OTHER): Payer: Self-pay

## 2016-02-12 VITALS — BP 144/70 | HR 77 | Ht 66.0 in | Wt 158.2 lb

## 2016-02-12 DIAGNOSIS — E785 Hyperlipidemia, unspecified: Secondary | ICD-10-CM

## 2016-02-12 DIAGNOSIS — N189 Chronic kidney disease, unspecified: Secondary | ICD-10-CM

## 2016-02-12 DIAGNOSIS — R0600 Dyspnea, unspecified: Secondary | ICD-10-CM | POA: Diagnosis not present

## 2016-02-12 DIAGNOSIS — D631 Anemia in chronic kidney disease: Secondary | ICD-10-CM

## 2016-02-12 DIAGNOSIS — I10 Essential (primary) hypertension: Secondary | ICD-10-CM

## 2016-02-12 DIAGNOSIS — Z940 Kidney transplant status: Secondary | ICD-10-CM | POA: Diagnosis not present

## 2016-02-12 NOTE — Progress Notes (Signed)
Cardiology Office Note    Date:  02/12/2016   ID:  Kimberly [name] 09-27-1955, MRN [id number]  PCP:  Marton Redwood, MD  Cardiologist: Sinclair Grooms, MD   Chief Complaint  Patient presents with  . Shortness of Breath    History of Present Illness:  Kimberly [name] is a 60 y.o. female for evaluation of dyspnea on exertion.  Kimberly [name] has a history of end-stage kidney disease, kidney transplantation 2010, diabetes, hyperlipidemia, and hypertension. Over the past 4-6 weeks, she has been experiencing exertional dyspnea. She has been found to have low hemoglobin in the 8.5 range on 02/04/16.. She is now receiving erythropoietin. Shortness of breath is not significantly improved. She denies chest pain. She has not had orthopnea, or PND. There is no prior history of heart disease. Recent echocardiogram demonstrates EF of 65-70% with mild ventricular hypertrophy performed on 01/25/16.  Past Medical History:  Diagnosis Date  . Adenomatous colon polyp 08/2007  . Diabetes mellitus type 2, controlled ([place])   . End stage renal disease ([place])   . Goiter   . Gout   . Hyperlipidemia   . Hypertension   . Hyperthyroidism   . Kidney disease   . Osteopenia   . Seasonal allergies   . 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 GANGLION CYST EXCISION Right 1994  . KIDNEY TRANSPLANT  June 2010    Current Medications: Outpatient Medications Prior to Visit  Medication Sig Dispense Refill  . allopurinol (ZYLOPRIM) 100 MG tablet Take 100 mg by mouth daily.    Marland Kitchen amLODipine (NORVASC) 10 MG tablet Take 10 mg by mouth daily.    Marland Kitchen ezetimibe-simvastatin (VYTORIN) 10-20 MG per tablet Take 1 tablet by mouth at bedtime.    Marland Kitchen glipiZIDE (GLUCOTROL) 10 MG tablet Take 10 mg by mouth 2 (two) times daily before a meal.    . hydrochlorothiazide (HYDRODIURIL) 25 MG tablet Take 25 mg by mouth daily.    . insulin glargine (LANTUS)  100 UNIT/ML injection Inject 26 Units into the skin at bedtime.     Marland Kitchen labetalol (NORMODYNE) 200 MG tablet Take 300 mg by mouth 2 (two) times daily.    . mycophenolate (CELLCEPT) 250 MG capsule Take by mouth 2 (two) times daily.    . Na Sulfate-K Sulfate-Mg Sulf 17.5-3.13-1.6 GM/180ML SOLN Take 1 kit by mouth once. 354 mL 0  . potassium chloride (K-DUR,KLOR-CON) 10 MEQ tablet Take 10 mEq by mouth daily.    . predniSONE (DELTASONE) 5 MG tablet Take 5 mg by mouth daily.    . ranitidine (ZANTAC) 150 MG capsule Take 150 mg by mouth every evening.    . tacrolimus (PROGRAF) 1 MG capsule Take 4 mg by mouth every morning. Take 3 mg by mouth each evening.     No facility-administered medications prior to visit.      Allergies:   Review of patient's allergies indicates no known allergies.   Social History   Social History  . Marital status: Married    Spouse name: N/A  . Number of children: 3  . Years of education: N/A   Social History Main Topics  . Smoking status: Former Smoker    Quit date: 07/09/1997  . Smokeless tobacco: Never Used  . Alcohol use No  . Drug use: No  . Sexual activity: Not Asked   Other Topics Concern  . None  Social History Narrative  . None     Family History:  The patient's family history includes Cancer - Other in her father; Colon cancer in her brother and sister; Heart disease in her mother.   ROS:   Please see the history of present illness.    Unexplained weight gain, change in appetite, dyspnea on exertion, easy bruising, occasional nausea and vomiting, and abdominal discomfort.  All other systems reviewed and are negative.   PHYSICAL EXAM:   VS:  BP (!) 144/70   Pulse 77   Ht 5' 6"  (1.676 m)   Wt 158 lb 3.2 oz (71.8 kg)   BMI 25.53 kg/m    GEN: Well nourished, well developed, in no acute distress  HEENT: normal  Neck: no JVD, carotid bruits, or masses Cardiac: RRR; no murmurs, rubs, or gallops,no edema. An S4 gallop is audible.  Respiratory:   clear to auscultation bilaterally, normal work of breathing GI: soft, nontender, nondistended, + BS MS: no deformity or atrophy  Skin: warm and dry, no rash Neuro:  Alert and Oriented x 3, Strength and sensation are intact Psych: euthymic mood, full affect  Wt Readings from Last 3 Encounters:  02/12/16 158 lb 3.2 oz (71.8 kg)  12/31/15 160 lb 6.4 oz (72.8 kg)  01/06/13 168 lb (76.2 kg)      Studies/Labs Reviewed:   EKG:  EKG  The electrocardiogram performed by Dr. Brigitte Pulse on 01/22/16 revealed sinus rhythm, nonspecific T wave abnormality, poor R-wave progression, and prominent voltage.  Recent Labs: 02/04/2016: Hemoglobin 8.8   Lipid Panel No results found for: CHOL, TRIG, HDL, CHOLHDL, VLDL, LDLCALC, LDLDIRECT  Additional studies/ records that were reviewed today include:  Reviewed the recent echocardiogram performed on 01/25/16: ------------------------------------------------------------------- Study Conclusions  - Left ventricle: The cavity size was normal. There was mild   concentric hypertrophy. Systolic function was vigorous. The   estimated ejection fraction was in the range of 65% to 70%. Wall   motion was normal; there were no regional wall motion   abnormalities. Doppler parameters are consistent with abnormal   left ventricular relaxation (grade 1 diastolic dysfunction). - Left atrium: The atrium was mildly dilated. - Tricuspid valve: There was mild regurgitation. - Pulmonic valve: There was mild regurgitation.  ASSESSMENT:    1. Dyspnea   2. Essential (primary) hypertension   3. HLD (hyperlipidemia)   4. Anemia in chronic renal disease   5. History of kidney transplant      PLAN:  In order of problems listed above:  1. Dyspnea is likely multifactorial, but mostly related to newly developed anemia presumed secondary to end-stage kidney disease. LV systolic function is normal. No significant diastolic abnormalities were noted on the recent echo. Given her risk  factor profile, coronary artery disease needs to be excluded to rule out angina equivalent presenting as dyspnea 2. Adequate blood pressure control 3. Currently on lipid therapy with Vytorin no recent data is available. 4. Status post kidney transplant. Recently started erythropoietin after noting hemoglobin less than 9. Current status not known. Last hemoglobin was 8.8 on 02/04/2016.    Medication Adjustments/Labs and Tests Ordered: Current medicines are reviewed at length with the patient today.  Concerns regarding medicines are outlined above.  Medication changes, Labs and Tests ordered today are listed in the Patient Instructions below. Patient Instructions  Medication Instructions:  Your physician recommends that you continue on your current medications as directed. Please refer to the Current Medication list given to you today.   Labwork:  None ordered  Testing/Procedures: Your physician has requested that you have en exercise stress myoview. For further information please visit [email or web address]. Please follow instruction sheet, as given.   Follow-Up: Your physician wants you to follow-up in:  WILL BE BASED UPON TEST RESULTS  Any Other Special Instructions Will Be Listed Below (If Applicable).     If you need a refill on your cardiac medications before your next appointment, please call your pharmacy.      Signed, Sinclair Grooms, MD  02/12/2016 12:06 PM    [address], [place], [place]  [id number] Phone: [phone]; Fax: [phone]

## 2016-02-12 NOTE — Patient Instructions (Addendum)
Medication Instructions:  Your physician recommends that you continue on your current medications as directed. Please refer to the Current Medication list given to you today.   Labwork: None ordered  Testing/Procedures: Your physician has requested that you have en exercise stress myoview. For further information please visit [email or web address]. Please follow instruction sheet, as given.   Follow-Up: Your physician wants you to follow-up in:  WILL BE BASED UPON TEST RESULTS  Any Other Special Instructions Will Be Listed Below (If Applicable).     If you need a refill on your cardiac medications before your next appointment, please call your pharmacy.

## 2016-02-12 NOTE — Telephone Encounter (Signed)
Left message on voicemail per DPR I/n reference to upcoming appointment scheduled on 02/14/16 at 0730 with detailed instructions given per Myocardial Perfusion Study Information Sheet for the test. LM to arrive 15 minutes early, and that it is imperative to arrive on time for appointment to keep from having the test rescheduled. If you need to cancel or reschedule your appointment, please call the office within 24 hours of your appointment. Failure to do so may result in a cancellation of your appointment, and a $50 no show fee. Phone number given for call back for any questions.

## 2016-02-13 DIAGNOSIS — N184 Chronic kidney disease, stage 4 (severe): Secondary | ICD-10-CM | POA: Diagnosis not present

## 2016-02-13 DIAGNOSIS — Z940 Kidney transplant status: Secondary | ICD-10-CM | POA: Diagnosis not present

## 2016-02-13 DIAGNOSIS — D649 Anemia, unspecified: Secondary | ICD-10-CM | POA: Diagnosis not present

## 2016-02-14 ENCOUNTER — Ambulatory Visit (HOSPITAL_COMMUNITY): Payer: Medicare Other | Attending: Cardiology

## 2016-02-14 ENCOUNTER — Encounter (INDEPENDENT_AMBULATORY_CARE_PROVIDER_SITE_OTHER): Payer: Self-pay

## 2016-02-14 DIAGNOSIS — E119 Type 2 diabetes mellitus without complications: Secondary | ICD-10-CM | POA: Insufficient documentation

## 2016-02-14 DIAGNOSIS — R0609 Other forms of dyspnea: Secondary | ICD-10-CM | POA: Insufficient documentation

## 2016-02-14 DIAGNOSIS — R0600 Dyspnea, unspecified: Secondary | ICD-10-CM

## 2016-02-14 DIAGNOSIS — I10 Essential (primary) hypertension: Secondary | ICD-10-CM | POA: Insufficient documentation

## 2016-02-14 LAB — MYOCARDIAL PERFUSION IMAGING
CHL CUP NUCLEAR SDS: 4
CHL CUP NUCLEAR SRS: 1
CHL CUP RESTING HR STRESS: 75 {beats}/min
CSEPED: 6 min
CSEPEDS: 0 s
CSEPEW: 6.5 METS
CSEPHR: 70 %
CSEPPHR: 111 {beats}/min
LV dias vol: 139 mL (ref 46–106)
LVSYSVOL: 59 mL
MPHR: 159 {beats}/min
RATE: 0.28
RPE: 19
SSS: 5
TID: 1.02

## 2016-02-14 MED ORDER — TECHNETIUM TC 99M TETROFOSMIN IV KIT
10.8000 | PACK | Freq: Once | INTRAVENOUS | Status: AC | PRN
  Administered 2016-02-14: 11 via INTRAVENOUS
  Filled 2016-02-14: qty 11

## 2016-02-14 MED ORDER — TECHNETIUM TC 99M TETROFOSMIN IV KIT
31.8000 | PACK | Freq: Once | INTRAVENOUS | Status: AC | PRN
  Administered 2016-02-14: 31.8 via INTRAVENOUS
  Filled 2016-02-14: qty 32

## 2016-02-14 MED ORDER — REGADENOSON 0.4 MG/5ML IV SOLN
0.4000 mg | Freq: Once | INTRAVENOUS | Status: AC
  Administered 2016-02-14: 0.4 mg via INTRAVENOUS

## 2016-02-18 ENCOUNTER — Encounter (HOSPITAL_COMMUNITY)
Admission: RE | Admit: 2016-02-18 | Discharge: 2016-02-18 | Disposition: A | Discharge status: Home / Self Care | Payer: Medicare Other | Source: Ambulatory Visit | Attending: Nephrology | Admitting: Nephrology

## 2016-02-18 DIAGNOSIS — N189 Chronic kidney disease, unspecified: Principal | ICD-10-CM

## 2016-02-18 DIAGNOSIS — D631 Anemia in chronic kidney disease: Secondary | ICD-10-CM

## 2016-02-18 DIAGNOSIS — N184 Chronic kidney disease, stage 4 (severe): Secondary | ICD-10-CM | POA: Diagnosis not present

## 2016-02-18 DIAGNOSIS — D638 Anemia in other chronic diseases classified elsewhere: Secondary | ICD-10-CM | POA: Diagnosis not present

## 2016-02-18 LAB — POCT HEMOGLOBIN-HEMACUE: Hemoglobin: 9.2 g/dL — ABNORMAL LOW (ref 12.0–15.0)

## 2016-02-18 MED ORDER — EPOETIN ALFA 20000 UNIT/ML IJ SOLN
INTRAMUSCULAR | Status: AC
  Filled 2016-02-18: qty 1

## 2016-02-18 MED ORDER — EPOETIN ALFA 20000 UNIT/ML IJ SOLN
20000.0000 [IU] | INTRAMUSCULAR | Status: DC
  Administered 2016-02-18: 20000 [IU] via SUBCUTANEOUS

## 2016-02-18 NOTE — Progress Notes (Signed)
Per pt. On 02/05/16 she was seen at [facility] medical and dr Moshe Cipro sent orders to them to draw additional labs at her visit with Dr Brigitte Pulse. Only did hemocue today and spoke with Santiago Glad at [facility] to make them aware.

## 2016-02-22 HISTORY — PX: OTHER SURGICAL HISTORY: SHX169

## 2016-02-27 DIAGNOSIS — E785 Hyperlipidemia, unspecified: Secondary | ICD-10-CM | POA: Diagnosis not present

## 2016-02-27 DIAGNOSIS — D631 Anemia in chronic kidney disease: Secondary | ICD-10-CM | POA: Diagnosis not present

## 2016-02-27 DIAGNOSIS — D899 Disorder involving the immune mechanism, unspecified: Secondary | ICD-10-CM | POA: Diagnosis not present

## 2016-02-27 DIAGNOSIS — Z794 Long term (current) use of insulin: Secondary | ICD-10-CM | POA: Diagnosis not present

## 2016-02-27 DIAGNOSIS — E876 Hypokalemia: Secondary | ICD-10-CM | POA: Diagnosis not present

## 2016-02-27 DIAGNOSIS — I129 Hypertensive chronic kidney disease with stage 1 through stage 4 chronic kidney disease, or unspecified chronic kidney disease: Secondary | ICD-10-CM | POA: Diagnosis not present

## 2016-02-27 DIAGNOSIS — E119 Type 2 diabetes mellitus without complications: Secondary | ICD-10-CM | POA: Diagnosis not present

## 2016-02-27 DIAGNOSIS — Z940 Kidney transplant status: Secondary | ICD-10-CM | POA: Diagnosis not present

## 2016-02-27 DIAGNOSIS — N189 Chronic kidney disease, unspecified: Secondary | ICD-10-CM | POA: Diagnosis not present

## 2016-02-27 DIAGNOSIS — Z9189 Other specified personal risk factors, not elsewhere classified: Secondary | ICD-10-CM | POA: Diagnosis not present

## 2016-02-27 DIAGNOSIS — E663 Overweight: Secondary | ICD-10-CM | POA: Diagnosis not present

## 2016-02-27 DIAGNOSIS — M109 Gout, unspecified: Secondary | ICD-10-CM | POA: Diagnosis not present

## 2016-02-28 ENCOUNTER — Ambulatory Visit (AMBULATORY_SURGERY_CENTER): Payer: Medicare Other | Admitting: Gastroenterology

## 2016-02-28 ENCOUNTER — Encounter: Payer: Self-pay | Admitting: Gastroenterology

## 2016-02-28 VITALS — BP 148/61 | HR 63 | Temp 97.5°F | Resp 17 | Ht 66.0 in | Wt 160.0 lb

## 2016-02-28 DIAGNOSIS — R195 Other fecal abnormalities: Secondary | ICD-10-CM | POA: Diagnosis not present

## 2016-02-28 DIAGNOSIS — R1013 Epigastric pain: Secondary | ICD-10-CM

## 2016-02-28 DIAGNOSIS — K219 Gastro-esophageal reflux disease without esophagitis: Secondary | ICD-10-CM | POA: Diagnosis not present

## 2016-02-28 DIAGNOSIS — D649 Anemia, unspecified: Secondary | ICD-10-CM | POA: Diagnosis not present

## 2016-02-28 DIAGNOSIS — Z860101 Personal history of adenomatous and serrated colon polyps: Secondary | ICD-10-CM

## 2016-02-28 DIAGNOSIS — E119 Type 2 diabetes mellitus without complications: Secondary | ICD-10-CM | POA: Diagnosis not present

## 2016-02-28 DIAGNOSIS — K2951 Unspecified chronic gastritis with bleeding: Secondary | ICD-10-CM | POA: Diagnosis not present

## 2016-02-28 DIAGNOSIS — K633 Ulcer of intestine: Secondary | ICD-10-CM

## 2016-02-28 DIAGNOSIS — Z86010 Personal history of colonic polyps: Secondary | ICD-10-CM | POA: Diagnosis not present

## 2016-02-28 DIAGNOSIS — R194 Change in bowel habit: Secondary | ICD-10-CM | POA: Diagnosis not present

## 2016-02-28 DIAGNOSIS — K5190 Ulcerative colitis, unspecified, without complications: Secondary | ICD-10-CM | POA: Diagnosis not present

## 2016-02-28 DIAGNOSIS — I10 Essential (primary) hypertension: Secondary | ICD-10-CM | POA: Diagnosis not present

## 2016-02-28 LAB — GLUCOSE, CAPILLARY
GLUCOSE-CAPILLARY: 126 mg/dL — AB (ref 65–99)
GLUCOSE-CAPILLARY: 127 mg/dL — AB (ref 65–99)

## 2016-02-28 MED ORDER — SODIUM CHLORIDE 0.9 % IV SOLN: 500.0000 mL | INTRAVENOUS | Status: DC

## 2016-02-28 NOTE — Progress Notes (Signed)
Report to PACU, RN, vss, BBS= Clear.  

## 2016-02-28 NOTE — Op Note (Signed)
[place] Patient Name: Kimberly [name] Procedure Date: 02/28/2016 10:57 AM MRN: [id number] Endoscopist: Ladene Artist , MD Age: 60 Referring MD:  Date of Birth: 1956-03-31 Gender: Female Account #: [id number] Procedure:                Colonoscopy Indications:              Heme positive stool, normocytic anemia, personal                            history of adenomatous colon polyps Medicines:                Monitored Anesthesia Care Procedure:                Pre-Anesthesia Assessment:                           - Prior to the procedure, a History and Physical                            was performed, and patient medications and                            allergies were reviewed. The patient's tolerance of                            previous anesthesia was also reviewed. The risks                            and benefits of the procedure and the sedation                            options and risks were discussed with the patient.                            All questions were answered, and informed consent                            was obtained. Prior Anticoagulants: The patient has                            taken no previous anticoagulant or antiplatelet                            agents. ASA Grade Assessment: III - A patient with                            severe systemic disease. After reviewing the risks                            and benefits, the patient was deemed in                            satisfactory condition to undergo the procedure.  After obtaining informed consent, the colonoscope                            was passed under direct vision. Throughout the                            procedure, the patient's blood pressure, pulse, and                            oxygen saturations were monitored continuously. The                            EC-[id number] ([id number]) was introduced through the anus                            and advanced to the  the cecum, identified by                            appendiceal orifice and ileocecal valve. The                            ileocecal valve, appendiceal orifice, and rectum                            were photographed. The quality of the bowel                            preparation was adequate. The colonoscopy was                            performed without difficulty. The patient tolerated                            the procedure well. Scope In: 11:05:18 AM Scope Out: 11:18:24 AM Scope Withdrawal Time: 0 hours 9 minutes 37 seconds  Total Procedure Duration: 0 hours 13 minutes 6 seconds  Findings:                 A single (solitary) eight mm ulcer was found at the                            ileocecal valve. No bleeding was present. No                            stigmata of recent bleeding were seen. Biopsies                            were taken with a cold forceps for histology.                           Localized mild patchy inflammation characterized by                            erythema and granularity was found in the  descending colon. Biopsies were taken with a cold                            forceps for histology.                           A few small-mouthed diverticula were found in the                            sigmoid colon and descending colon. There was no                            evidence of diverticular bleeding.                           Small grade I internal hemorrhoids and a localized                            area of granular mucosa was found in the distal                            rectum.                           The exam was otherwise normal throughout the                            examined colon.                           The perianal exam findings include perianal                            condylomata. Complications:            No immediate complications. Estimated blood loss:                            None. Estimated Blood  Loss:     Estimated blood loss: none. Impression:               - A single (solitary) ulcer at the ileocecal valve.                            Biopsied.                           - Localized mild inflammation was found in the                            descending colon secondary to colitis. Biopsied.                           - Mild diverticulosis in the sigmoid colon and in                            the descending colon.                           -  Grade I internal hemorrhoids and granularity in                            the distal rectum.                           - Perianal findings c/w condyloma Recommendation:           - Repeat colonoscopy in 5 years for surveillance                            with a more extensive bowel prep.                           - Patient has a contact number available for                            emergencies. The signs and symptoms of potential                            delayed complications were discussed with the                            patient. Return to normal activities tomorrow.                            Written discharge instructions were provided to the                            patient.                           - Resume previous diet.                           - Continue present medications.                           - Await pathology results.                           - Surgical referral for suspected condyloma Ladene Artist, MD 02/28/2016 11:35:10 AM This report has been signed electronically.

## 2016-02-28 NOTE — Progress Notes (Signed)
No problems noted in the recovery room. maw 

## 2016-02-28 NOTE — Op Note (Signed)
[place] Patient Name: Kimberly [name] Procedure Date: 02/28/2016 10:57 AM MRN: [id number] Endoscopist: Ladene Artist , MD Age: 60 Referring MD:  Date of Birth: 01-21-1956 Gender: Female Account #: [id number] Procedure:                Upper GI endoscopy Indications:              Epigastric abdominal pain, Heme positive stool,                            normocytic anemia Medicines:                Monitored Anesthesia Care Procedure:                Pre-Anesthesia Assessment:                           - Prior to the procedure, a History and Physical                            was performed, and patient medications and                            allergies were reviewed. The patient's tolerance of                            previous anesthesia was also reviewed. The risks                            and benefits of the procedure and the sedation                            options and risks were discussed with the patient.                            All questions were answered, and informed consent                            was obtained. Prior Anticoagulants: The patient has                            taken no previous anticoagulant or antiplatelet                            agents. ASA Grade Assessment: III - A patient with                            severe systemic disease. After reviewing the risks                            and benefits, the patient was deemed in                            satisfactory condition to undergo the procedure.  After obtaining informed consent, the endoscope was                            passed under direct vision. Throughout the                            procedure, the patient's blood pressure, pulse, and                            oxygen saturations were monitored continuously. The                            Model GIF-[id number]) scope was introduced                            through the mouth, and advanced to  the second part                            of duodenum. The upper GI endoscopy was                            accomplished without difficulty. The patient                            tolerated the procedure well. Scope In: Scope Out: Findings:                 The examined esophagus was normal.                           Localized severe inflammation characterized by                            congestion (edema), erosions, erythema, friability                            and granularity was found in the prepyloric area                            and antrum of the stomach. Biopsies were taken with                            a cold forceps for histology.                           The exam of the stomach was otherwise normal.                           Localized moderate inflammation characterized by                            erythema, friability and granularity was found in                            the second portion of the duodenum. Biopsies were  taken with a cold forceps for histology.                           The duodenal bulb was normal. Complications:            No immediate complications. Estimated Blood Loss:     Estimated blood loss was minimal. Impression:               - Normal esophagus.                           - Antral gastritis. Biopsied.                           - Duodenitis. Biopsied.                           - Normal duodenal bulb. Recommendation:           - Resume previous diet.                           - Continue present medications.                           - Await pathology results. Ladene Artist, MD 02/28/2016 11:39:42 AM This report has been signed electronically.

## 2016-02-28 NOTE — Patient Instructions (Signed)
YOU HAD AN ENDOSCOPIC PROCEDURE TODAY AT [facility]:   Refer to the procedure report that was given to you for any specific questions about what was found during the examination.  If the procedure report does not answer your questions, please call your gastroenterologist to clarify.  If you requested that your care partner not be given the details of your procedure findings, then the procedure report has been included in a sealed envelope for you to review at your convenience later.  YOU SHOULD EXPECT: Some feelings of bloating in the abdomen. Passage of more gas than usual.  Walking can help get rid of the air that was put into your GI tract during the procedure and reduce the bloating. If you had a lower endoscopy (such as a colonoscopy or flexible sigmoidoscopy) you may notice spotting of blood in your stool or on the toilet paper. If you underwent a bowel prep for your procedure, you may not have a normal bowel movement for a few days.  Please Note:  You might notice some irritation and congestion in your nose or some drainage.  This is from the oxygen used during your procedure.  There is no need for concern and it should clear up in a day or so.  SYMPTOMS TO REPORT IMMEDIATELY:   Following lower endoscopy (colonoscopy or flexible sigmoidoscopy):  Excessive amounts of blood in the stool  Significant tenderness or worsening of abdominal pains  Swelling of the abdomen that is new, acute  Fever of 100F or higher   Following upper endoscopy (EGD)  Vomiting of blood or coffee ground material  New chest pain or pain under the shoulder blades  Painful or persistently difficult swallowing  New shortness of breath  Fever of 100F or higher  Black, tarry-looking stools  For urgent or emergent issues, a gastroenterologist can be reached at any hour by calling [phone].   DIET:  We do recommend a small meal at first, but then you may proceed to your regular diet.  Drink  plenty of fluids but you should avoid alcoholic beverages for 24 hours.  ACTIVITY:  You should plan to take it easy for the rest of today and you should NOT DRIVE or use heavy machinery until tomorrow (because of the sedation medicines used during the test).    FOLLOW UP: Our staff will call the number listed on your records the next business day following your procedure to check on you and address any questions or concerns that you may have regarding the information given to you following your procedure. If we do not reach you, we will leave a message.  However, if you are feeling well and you are not experiencing any problems, there is no need to return our call.  We will assume that you have returned to your regular daily activities without incident.  If any biopsies were taken you will be contacted by phone or by letter within the next 1-3 weeks.  Please call us at [phone] if you have not heard about the biopsies in 3 weeks.    SIGNATURES/CONFIDENTIALITY: You and/or your care partner have signed paperwork which will be entered into your electronic medical record.  These signatures attest to the fact that that the information above on your After Visit Summary has been reviewed and is understood.  Full responsibility of the confidentiality of this discharge information lies with you and/or your care-partner.    Handouts were given to your care partner on gastritis,  diverticulosis, hemorrhoids, and a high fiber diet with liberal fluid intake You may resume your current medications today.  Per Dr. Fuller Plan continue taking omeprazole. Your blood sugar was 127 in the recovery room. Pt has appointment for a surgeon for inflammation of your rectum. Await biopsy results. Please call if any questions or concerns.

## 2016-02-28 NOTE — Progress Notes (Signed)
Called to room to assist during endoscopic procedure.  Patient ID and intended procedure confirmed with present staff. Received instructions for my participation in the procedure from the performing physician.  

## 2016-02-29 ENCOUNTER — Other Ambulatory Visit (HOSPITAL_COMMUNITY): Payer: Self-pay | Admitting: *Deleted

## 2016-02-29 ENCOUNTER — Telehealth: Payer: Self-pay | Admitting: *Deleted

## 2016-02-29 DIAGNOSIS — A630 Anogenital (venereal) warts: Secondary | ICD-10-CM | POA: Diagnosis not present

## 2016-02-29 NOTE — Telephone Encounter (Signed)
  Follow up Call-  Call back number 02/28/2016  Post procedure Call Back phone  # [phone]  Permission to leave phone message Yes  Some recent data might be hidden     Patient questions:  Do you have a fever, pain , or abdominal swelling? No. Pain Score  0 *  Have you tolerated food without any problems? Yes.    Have you been able to return to your normal activities? Yes.    Do you have any questions about your discharge instructions: Diet   No. Medications  No. Follow up visit  No.  Do you have questions or concerns about your Care? No.  Actions: * If pain score is 4 or above: No action needed, pain <4.

## 2016-03-03 ENCOUNTER — Telehealth: Payer: Self-pay

## 2016-03-03 ENCOUNTER — Encounter (HOSPITAL_COMMUNITY)
Admission: RE | Admit: 2016-03-03 | Discharge: 2016-03-03 | Disposition: A | Discharge status: Home / Self Care | Payer: Medicare Other | Source: Ambulatory Visit | Attending: Nephrology | Admitting: Nephrology

## 2016-03-03 DIAGNOSIS — D638 Anemia in other chronic diseases classified elsewhere: Secondary | ICD-10-CM | POA: Diagnosis not present

## 2016-03-03 DIAGNOSIS — H2513 Age-related nuclear cataract, bilateral: Secondary | ICD-10-CM | POA: Diagnosis not present

## 2016-03-03 DIAGNOSIS — N184 Chronic kidney disease, stage 4 (severe): Secondary | ICD-10-CM | POA: Diagnosis not present

## 2016-03-03 DIAGNOSIS — E113291 Type 2 diabetes mellitus with mild nonproliferative diabetic retinopathy without macular edema, right eye: Secondary | ICD-10-CM | POA: Diagnosis not present

## 2016-03-03 DIAGNOSIS — D631 Anemia in chronic kidney disease: Secondary | ICD-10-CM

## 2016-03-03 DIAGNOSIS — N189 Chronic kidney disease, unspecified: Secondary | ICD-10-CM

## 2016-03-03 LAB — POCT HEMOGLOBIN-HEMACUE: HEMOGLOBIN: 10 g/dL — AB (ref 12.0–15.0)

## 2016-03-03 MED ORDER — EPOETIN ALFA 20000 UNIT/ML IJ SOLN
INTRAMUSCULAR | Status: AC
  Administered 2016-03-03: 20000 [IU] via SUBCUTANEOUS
  Filled 2016-03-03: qty 1

## 2016-03-03 MED ORDER — EPOETIN ALFA 20000 UNIT/ML IJ SOLN: 20000.0000 [IU] | INTRAMUSCULAR | Status: DC

## 2016-03-03 NOTE — Telephone Encounter (Signed)
Patient has already saw Dr. Redmond Pulling at [place] on Friday for anal condyloma.  She has a plan in place for removal.

## 2016-03-06 ENCOUNTER — Encounter: Payer: Self-pay | Admitting: Gastroenterology

## 2016-03-17 ENCOUNTER — Encounter (HOSPITAL_COMMUNITY)
Admission: RE | Admit: 2016-03-17 | Discharge: 2016-03-17 | Disposition: A | Discharge status: Home / Self Care | Payer: Medicare Other | Source: Ambulatory Visit | Attending: Nephrology | Admitting: Nephrology

## 2016-03-17 DIAGNOSIS — N189 Chronic kidney disease, unspecified: Principal | ICD-10-CM

## 2016-03-17 DIAGNOSIS — N184 Chronic kidney disease, stage 4 (severe): Secondary | ICD-10-CM | POA: Diagnosis not present

## 2016-03-17 DIAGNOSIS — D631 Anemia in chronic kidney disease: Secondary | ICD-10-CM

## 2016-03-17 DIAGNOSIS — D638 Anemia in other chronic diseases classified elsewhere: Secondary | ICD-10-CM | POA: Diagnosis not present

## 2016-03-17 LAB — POCT HEMOGLOBIN-HEMACUE: Hemoglobin: 10.2 g/dL — ABNORMAL LOW (ref 12.0–15.0)

## 2016-03-17 MED ORDER — EPOETIN ALFA 20000 UNIT/ML IJ SOLN
INTRAMUSCULAR | Status: AC
  Administered 2016-03-17: 20000 [IU] via SUBCUTANEOUS
  Filled 2016-03-17: qty 1

## 2016-03-17 MED ORDER — EPOETIN ALFA 20000 UNIT/ML IJ SOLN
20000.0000 [IU] | INTRAMUSCULAR | Status: DC
  Administered 2016-03-17: 20000 [IU] via SUBCUTANEOUS

## 2016-03-26 DIAGNOSIS — D649 Anemia, unspecified: Secondary | ICD-10-CM | POA: Diagnosis not present

## 2016-03-26 DIAGNOSIS — Z940 Kidney transplant status: Secondary | ICD-10-CM | POA: Diagnosis not present

## 2016-03-26 DIAGNOSIS — I10 Essential (primary) hypertension: Secondary | ICD-10-CM | POA: Diagnosis not present

## 2016-03-26 DIAGNOSIS — R195 Other fecal abnormalities: Secondary | ICD-10-CM | POA: Diagnosis not present

## 2016-03-28 ENCOUNTER — Other Ambulatory Visit (HOSPITAL_COMMUNITY): Payer: Self-pay | Admitting: *Deleted

## 2016-03-31 ENCOUNTER — Ambulatory Visit (HOSPITAL_COMMUNITY)
Admission: RE | Admit: 2016-03-31 | Discharge: 2016-03-31 | Disposition: A | Discharge status: Home / Self Care | Payer: Medicare Other | Source: Ambulatory Visit | Attending: Nephrology | Admitting: Nephrology

## 2016-03-31 DIAGNOSIS — N184 Chronic kidney disease, stage 4 (severe): Secondary | ICD-10-CM | POA: Insufficient documentation

## 2016-03-31 DIAGNOSIS — D509 Iron deficiency anemia, unspecified: Secondary | ICD-10-CM | POA: Diagnosis not present

## 2016-03-31 DIAGNOSIS — D631 Anemia in chronic kidney disease: Secondary | ICD-10-CM | POA: Diagnosis not present

## 2016-03-31 MED ORDER — FERUMOXYTOL INJECTION 510 MG/17 ML
510.0000 mg | INTRAVENOUS | Status: DC
  Administered 2016-03-31: 510 mg via INTRAVENOUS
  Filled 2016-03-31: qty 17

## 2016-03-31 MED ORDER — EPOETIN ALFA 20000 UNIT/ML IJ SOLN
20000.0000 [IU] | INTRAMUSCULAR | Status: DC
  Administered 2016-03-31: 20000 [IU] via SUBCUTANEOUS

## 2016-03-31 MED ORDER — EPOETIN ALFA 20000 UNIT/ML IJ SOLN
INTRAMUSCULAR | Status: AC
  Filled 2016-03-31: qty 1

## 2016-03-31 NOTE — Progress Notes (Signed)
Pt came in today for Feraheme and Shot.  We had numerous labs to draw.  PT stated that she had them done on 03/28/2016 at Dr Manya Silvas office.  I called and spoke to [place] at [facility] and she verified that some of the labs had been drawn.  We still need a UA and CMV and BK and uric acid and Hgb A1C and lipid panel.  Pt is not fasting so we are going to do all of the labs next week when she comes in for her second does of Feraheme.  Pt is aggreable to this.  Crystal also said that we could use the Hgb from her CBC on 03/28/2016 which was 10.6 for her result for her shot today

## 2016-04-07 ENCOUNTER — Ambulatory Visit (HOSPITAL_COMMUNITY)
Admission: RE | Admit: 2016-04-07 | Discharge: 2016-04-07 | Disposition: A | Discharge status: Home / Self Care | Payer: Medicare Other | Source: Ambulatory Visit | Attending: Nephrology | Admitting: Nephrology

## 2016-04-07 DIAGNOSIS — D509 Iron deficiency anemia, unspecified: Secondary | ICD-10-CM | POA: Diagnosis not present

## 2016-04-07 MED ORDER — FERUMOXYTOL INJECTION 510 MG/17 ML
510.0000 mg | INTRAVENOUS | Status: AC
  Administered 2016-04-07: 510 mg via INTRAVENOUS
  Filled 2016-04-07: qty 17

## 2016-04-14 ENCOUNTER — Encounter (HOSPITAL_COMMUNITY)
Admission: RE | Admit: 2016-04-14 | Discharge: 2016-04-14 | Disposition: A | Discharge status: Home / Self Care | Payer: Medicare Other | Source: Ambulatory Visit | Attending: Nephrology | Admitting: Nephrology

## 2016-04-14 DIAGNOSIS — N184 Chronic kidney disease, stage 4 (severe): Secondary | ICD-10-CM | POA: Diagnosis not present

## 2016-04-14 DIAGNOSIS — D638 Anemia in other chronic diseases classified elsewhere: Secondary | ICD-10-CM | POA: Insufficient documentation

## 2016-04-14 DIAGNOSIS — D631 Anemia in chronic kidney disease: Secondary | ICD-10-CM

## 2016-04-14 LAB — FERRITIN: FERRITIN: 687 ng/mL — AB (ref 11–307)

## 2016-04-14 LAB — COMPREHENSIVE METABOLIC PANEL
ALBUMIN: 3.4 g/dL — AB (ref 3.5–5.0)
ALT: 11 U/L — ABNORMAL LOW (ref 14–54)
ANION GAP: 6 (ref 5–15)
AST: 20 U/L (ref 15–41)
Alkaline Phosphatase: 91 U/L (ref 38–126)
BUN: 41 mg/dL — ABNORMAL HIGH (ref 6–20)
CHLORIDE: 119 mmol/L — AB (ref 101–111)
CO2: 17 mmol/L — AB (ref 22–32)
Calcium: 9.4 mg/dL (ref 8.9–10.3)
Creatinine, Ser: 2.45 mg/dL — ABNORMAL HIGH (ref 0.44–1.00)
GFR calc non Af Amer: 20 mL/min — ABNORMAL LOW (ref >60)
GFR, EST AFRICAN AMERICAN: 23 mL/min — AB (ref >60)
Glucose, Bld: 105 mg/dL — ABNORMAL HIGH (ref 65–99)
Potassium: 3.5 mmol/L (ref 3.5–5.1)
SODIUM: 142 mmol/L (ref 135–145)
Total Bilirubin: 0.3 mg/dL (ref 0.3–1.2)
Total Protein: 6 g/dL — ABNORMAL LOW (ref 6.5–8.1)

## 2016-04-14 LAB — DIFFERENTIAL
Basophils Absolute: 0 10*3/uL (ref 0.0–0.1)
Basophils Relative: 0 %
EOS PCT: 3 %
Eosinophils Absolute: 0.2 10*3/uL (ref 0.0–0.7)
LYMPHS ABS: 0.4 10*3/uL — AB (ref 0.7–4.0)
LYMPHS PCT: 6 %
MONO ABS: 0.4 10*3/uL (ref 0.1–1.0)
MONOS PCT: 6 %
Neutro Abs: 4.8 10*3/uL (ref 1.7–7.7)
Neutrophils Relative %: 85 %

## 2016-04-14 LAB — URINALYSIS W MICROSCOPIC (NOT AT ARMC)
BILIRUBIN URINE: NEGATIVE
Glucose, UA: NEGATIVE mg/dL
Hgb urine dipstick: NEGATIVE
KETONES UR: NEGATIVE mg/dL
LEUKOCYTES UA: NEGATIVE
Nitrite: NEGATIVE
PH: 6 (ref 5.0–8.0)
Protein, ur: 100 mg/dL — AB
Specific Gravity, Urine: 1.015 (ref 1.005–1.030)

## 2016-04-14 LAB — CBC
HEMATOCRIT: 35.4 % — AB (ref 36.0–46.0)
HEMOGLOBIN: 11.1 g/dL — AB (ref 12.0–15.0)
MCH: 28.1 pg (ref 26.0–34.0)
MCHC: 31.4 g/dL (ref 30.0–36.0)
MCV: 89.6 fL (ref 78.0–100.0)
Platelets: 198 10*3/uL (ref 150–400)
RBC: 3.95 MIL/uL (ref 3.87–5.11)
RDW: 16.4 % — ABNORMAL HIGH (ref 11.5–15.5)
WBC: 5.7 10*3/uL (ref 4.0–10.5)

## 2016-04-14 LAB — URIC ACID: Uric Acid, Serum: 6.5 mg/dL (ref 2.3–6.6)

## 2016-04-14 LAB — IRON AND TIBC
Iron: 55 ug/dL (ref 28–170)
SATURATION RATIOS: 24 % (ref 10.4–31.8)
TIBC: 230 ug/dL — AB (ref 250–450)
UIBC: 175 ug/dL

## 2016-04-14 LAB — PHOSPHORUS: Phosphorus: 3.9 mg/dL (ref 2.5–4.6)

## 2016-04-14 LAB — MAGNESIUM: MAGNESIUM: 1.5 mg/dL — AB (ref 1.7–2.4)

## 2016-04-14 MED ORDER — EPOETIN ALFA 20000 UNIT/ML IJ SOLN
20000.0000 [IU] | INTRAMUSCULAR | Status: DC
  Administered 2016-04-14: 20000 [IU] via SUBCUTANEOUS

## 2016-04-14 MED ORDER — EPOETIN ALFA 20000 UNIT/ML IJ SOLN
INTRAMUSCULAR | Status: DC
Start: 2016-04-14 — End: 2016-04-15
  Filled 2016-04-14: qty 1

## 2016-04-15 LAB — TACROLIMUS LEVEL: Tacrolimus (FK506) - LabCorp: 3.2 ng/mL (ref 2.0–20.0)

## 2016-04-15 LAB — HEMOGLOBIN A1C
Hgb A1c MFr Bld: 6.2 % — ABNORMAL HIGH (ref 4.8–5.6)
MEAN PLASMA GLUCOSE: 131 mg/dL

## 2016-04-16 LAB — CYTOMEGALOVIRUS DNA, QUANTITATIVE REAL-TIME PCR, PLASMA
CMV DNA Quant: 3024 [IU]/mL
Log10 CMV Qn DNA Pl: 3.481 {Log_IU}/mL

## 2016-04-17 LAB — MISC LABCORP TEST (SEND OUT): Labcorp test code: 138962

## 2016-04-28 ENCOUNTER — Encounter (HOSPITAL_COMMUNITY)
Admission: RE | Admit: 2016-04-28 | Discharge: 2016-04-28 | Disposition: A | Discharge status: Home / Self Care | Payer: Medicare Other | Source: Ambulatory Visit | Attending: Nephrology | Admitting: Nephrology

## 2016-04-28 DIAGNOSIS — D631 Anemia in chronic kidney disease: Secondary | ICD-10-CM

## 2016-04-28 DIAGNOSIS — D638 Anemia in other chronic diseases classified elsewhere: Secondary | ICD-10-CM | POA: Insufficient documentation

## 2016-04-28 DIAGNOSIS — N184 Chronic kidney disease, stage 4 (severe): Secondary | ICD-10-CM | POA: Insufficient documentation

## 2016-04-28 MED ORDER — EPOETIN ALFA 20000 UNIT/ML IJ SOLN
20000.0000 [IU] | INTRAMUSCULAR | Status: DC
  Administered 2016-04-28: 20000 [IU] via SUBCUTANEOUS

## 2016-04-28 MED ORDER — EPOETIN ALFA 20000 UNIT/ML IJ SOLN
INTRAMUSCULAR | Status: AC
  Filled 2016-04-28: qty 1

## 2016-04-29 LAB — POCT HEMOGLOBIN-HEMACUE: HEMOGLOBIN: 10.6 g/dL — AB (ref 12.0–15.0)

## 2016-04-30 DIAGNOSIS — M859 Disorder of bone density and structure, unspecified: Secondary | ICD-10-CM | POA: Diagnosis not present

## 2016-04-30 DIAGNOSIS — E784 Other hyperlipidemia: Secondary | ICD-10-CM | POA: Diagnosis not present

## 2016-04-30 DIAGNOSIS — E048 Other specified nontoxic goiter: Secondary | ICD-10-CM | POA: Diagnosis not present

## 2016-04-30 DIAGNOSIS — I10 Essential (primary) hypertension: Secondary | ICD-10-CM | POA: Diagnosis not present

## 2016-04-30 DIAGNOSIS — Z940 Kidney transplant status: Secondary | ICD-10-CM | POA: Diagnosis not present

## 2016-04-30 DIAGNOSIS — E119 Type 2 diabetes mellitus without complications: Secondary | ICD-10-CM | POA: Diagnosis not present

## 2016-04-30 DIAGNOSIS — M109 Gout, unspecified: Secondary | ICD-10-CM | POA: Diagnosis not present

## 2016-04-30 DIAGNOSIS — A630 Anogenital (venereal) warts: Secondary | ICD-10-CM | POA: Diagnosis not present

## 2016-05-07 DIAGNOSIS — R946 Abnormal results of thyroid function studies: Secondary | ICD-10-CM | POA: Diagnosis not present

## 2016-05-07 DIAGNOSIS — Z6825 Body mass index (BMI) 25.0-25.9, adult: Secondary | ICD-10-CM | POA: Diagnosis not present

## 2016-05-07 DIAGNOSIS — E119 Type 2 diabetes mellitus without complications: Secondary | ICD-10-CM | POA: Diagnosis not present

## 2016-05-07 DIAGNOSIS — Z1389 Encounter for screening for other disorder: Secondary | ICD-10-CM | POA: Diagnosis not present

## 2016-05-07 DIAGNOSIS — N184 Chronic kidney disease, stage 4 (severe): Secondary | ICD-10-CM | POA: Diagnosis not present

## 2016-05-07 DIAGNOSIS — E784 Other hyperlipidemia: Secondary | ICD-10-CM | POA: Diagnosis not present

## 2016-05-07 DIAGNOSIS — Z940 Kidney transplant status: Secondary | ICD-10-CM | POA: Diagnosis not present

## 2016-05-07 DIAGNOSIS — I10 Essential (primary) hypertension: Secondary | ICD-10-CM | POA: Diagnosis not present

## 2016-05-07 DIAGNOSIS — I517 Cardiomegaly: Secondary | ICD-10-CM | POA: Diagnosis not present

## 2016-05-07 DIAGNOSIS — Z0000 Encounter for general adult medical examination without abnormal findings: Secondary | ICD-10-CM | POA: Diagnosis not present

## 2016-05-07 DIAGNOSIS — D638 Anemia in other chronic diseases classified elsewhere: Secondary | ICD-10-CM | POA: Diagnosis not present

## 2016-05-07 DIAGNOSIS — Z23 Encounter for immunization: Secondary | ICD-10-CM | POA: Diagnosis not present

## 2016-05-09 ENCOUNTER — Other Ambulatory Visit (HOSPITAL_COMMUNITY): Payer: Self-pay | Admitting: Internal Medicine

## 2016-05-09 DIAGNOSIS — R946 Abnormal results of thyroid function studies: Secondary | ICD-10-CM

## 2016-05-12 ENCOUNTER — Encounter (HOSPITAL_COMMUNITY)
Admission: RE | Admit: 2016-05-12 | Discharge: 2016-05-12 | Disposition: A | Discharge status: Home / Self Care | Payer: Medicare Other | Source: Ambulatory Visit | Attending: Nephrology | Admitting: Nephrology

## 2016-05-12 DIAGNOSIS — D638 Anemia in other chronic diseases classified elsewhere: Secondary | ICD-10-CM | POA: Diagnosis not present

## 2016-05-12 DIAGNOSIS — D631 Anemia in chronic kidney disease: Secondary | ICD-10-CM

## 2016-05-12 DIAGNOSIS — N184 Chronic kidney disease, stage 4 (severe): Principal | ICD-10-CM

## 2016-05-12 LAB — LIPID PANEL
Cholesterol: 111 mg/dL (ref 0–200)
HDL: 18 mg/dL — ABNORMAL LOW (ref >40)
LDL Cholesterol: 38 mg/dL (ref 0–99)
Total CHOL/HDL Ratio: 6.2 RATIO
Triglycerides: 273 mg/dL — ABNORMAL HIGH (ref <150)
VLDL: 55 mg/dL — ABNORMAL HIGH (ref 0–40)

## 2016-05-12 LAB — POCT HEMOGLOBIN-HEMACUE: Hemoglobin: 11.3 g/dL — ABNORMAL LOW (ref 12.0–15.0)

## 2016-05-12 MED ORDER — EPOETIN ALFA 20000 UNIT/ML IJ SOLN
INTRAMUSCULAR | Status: DC
Start: 2016-05-12 — End: 2016-05-13
  Filled 2016-05-12: qty 1

## 2016-05-12 MED ORDER — EPOETIN ALFA 20000 UNIT/ML IJ SOLN
20000.0000 [IU] | INTRAMUSCULAR | Status: DC
  Administered 2016-05-12: 20000 [IU] via SUBCUTANEOUS

## 2016-05-22 ENCOUNTER — Ambulatory Visit (HOSPITAL_COMMUNITY)
Admission: RE | Admit: 2016-05-22 | Discharge: 2016-05-22 | Disposition: A | Discharge status: Home / Self Care | Payer: Medicare Other | Source: Ambulatory Visit | Attending: Internal Medicine | Admitting: Internal Medicine

## 2016-05-22 DIAGNOSIS — R946 Abnormal results of thyroid function studies: Secondary | ICD-10-CM

## 2016-05-22 MED ORDER — SODIUM IODIDE I 131 CAPSULE
11.3000 | Freq: Once | INTRAVENOUS | Status: AC | PRN
  Administered 2016-05-22: 11.3 via ORAL

## 2016-05-23 ENCOUNTER — Ambulatory Visit (HOSPITAL_COMMUNITY)
Admission: RE | Admit: 2016-05-23 | Discharge: 2016-05-23 | Disposition: A | Discharge status: Home / Self Care | Payer: Medicare Other | Source: Ambulatory Visit | Attending: Internal Medicine | Admitting: Internal Medicine

## 2016-05-23 DIAGNOSIS — E042 Nontoxic multinodular goiter: Secondary | ICD-10-CM | POA: Insufficient documentation

## 2016-05-23 DIAGNOSIS — E0789 Other specified disorders of thyroid: Secondary | ICD-10-CM | POA: Insufficient documentation

## 2016-05-23 MED ORDER — SODIUM PERTECHNETATE TC 99M INJECTION
10.0000 | Freq: Once | INTRAVENOUS | Status: AC | PRN
  Administered 2016-05-23: 10 via INTRAVENOUS

## 2016-05-26 ENCOUNTER — Encounter (HOSPITAL_COMMUNITY)
Admission: RE | Admit: 2016-05-26 | Discharge: 2016-05-26 | Disposition: A | Discharge status: Home / Self Care | Payer: Medicare Other | Source: Ambulatory Visit | Attending: Nephrology | Admitting: Nephrology

## 2016-05-26 DIAGNOSIS — D638 Anemia in other chronic diseases classified elsewhere: Secondary | ICD-10-CM | POA: Diagnosis not present

## 2016-05-26 DIAGNOSIS — D631 Anemia in chronic kidney disease: Secondary | ICD-10-CM

## 2016-05-26 DIAGNOSIS — E042 Nontoxic multinodular goiter: Secondary | ICD-10-CM | POA: Insufficient documentation

## 2016-05-26 DIAGNOSIS — N184 Chronic kidney disease, stage 4 (severe): Secondary | ICD-10-CM | POA: Insufficient documentation

## 2016-05-26 LAB — POCT HEMOGLOBIN-HEMACUE: HEMOGLOBIN: 10.9 g/dL — AB (ref 12.0–15.0)

## 2016-05-26 MED ORDER — EPOETIN ALFA 20000 UNIT/ML IJ SOLN
INTRAMUSCULAR | Status: AC
  Administered 2016-05-26: 20000 [IU] via SUBCUTANEOUS
  Filled 2016-05-26: qty 1

## 2016-05-26 MED ORDER — EPOETIN ALFA 20000 UNIT/ML IJ SOLN
20000.0000 [IU] | INTRAMUSCULAR | Status: DC
  Administered 2016-05-26: 20000 [IU] via SUBCUTANEOUS

## 2016-06-02 DIAGNOSIS — K529 Noninfective gastroenteritis and colitis, unspecified: Secondary | ICD-10-CM | POA: Diagnosis not present

## 2016-06-02 DIAGNOSIS — Z794 Long term (current) use of insulin: Secondary | ICD-10-CM | POA: Diagnosis not present

## 2016-06-02 DIAGNOSIS — I129 Hypertensive chronic kidney disease with stage 1 through stage 4 chronic kidney disease, or unspecified chronic kidney disease: Secondary | ICD-10-CM | POA: Diagnosis not present

## 2016-06-02 DIAGNOSIS — D899 Disorder involving the immune mechanism, unspecified: Secondary | ICD-10-CM | POA: Diagnosis not present

## 2016-06-02 DIAGNOSIS — N189 Chronic kidney disease, unspecified: Secondary | ICD-10-CM | POA: Diagnosis not present

## 2016-06-02 DIAGNOSIS — Z9189 Other specified personal risk factors, not elsewhere classified: Secondary | ICD-10-CM | POA: Diagnosis not present

## 2016-06-02 DIAGNOSIS — E0590 Thyrotoxicosis, unspecified without thyrotoxic crisis or storm: Secondary | ICD-10-CM | POA: Diagnosis not present

## 2016-06-02 DIAGNOSIS — D631 Anemia in chronic kidney disease: Secondary | ICD-10-CM | POA: Diagnosis not present

## 2016-06-02 DIAGNOSIS — Z940 Kidney transplant status: Secondary | ICD-10-CM | POA: Diagnosis not present

## 2016-06-02 DIAGNOSIS — E119 Type 2 diabetes mellitus without complications: Secondary | ICD-10-CM | POA: Diagnosis not present

## 2016-06-02 DIAGNOSIS — E876 Hypokalemia: Secondary | ICD-10-CM | POA: Diagnosis not present

## 2016-06-02 DIAGNOSIS — E785 Hyperlipidemia, unspecified: Secondary | ICD-10-CM | POA: Diagnosis not present

## 2016-06-04 NOTE — Progress Notes (Signed)
Pt is being scheduled for preop appt; please place surgical orders in epic. Thanks.  

## 2016-06-06 ENCOUNTER — Other Ambulatory Visit (HOSPITAL_COMMUNITY): Payer: Self-pay | Admitting: *Deleted

## 2016-06-06 ENCOUNTER — Ambulatory Visit: Payer: Self-pay | Admitting: General Surgery

## 2016-06-09 ENCOUNTER — Inpatient Hospital Stay (HOSPITAL_COMMUNITY): Admission: RE | Admit: 2016-06-09 | Payer: Medicare Other | Source: Ambulatory Visit

## 2016-06-12 ENCOUNTER — Encounter (HOSPITAL_COMMUNITY)
Admission: RE | Admit: 2016-06-12 | Discharge: 2016-06-12 | Disposition: A | Discharge status: Home / Self Care | Payer: Medicare Other | Source: Ambulatory Visit | Attending: Nephrology | Admitting: Nephrology

## 2016-06-12 DIAGNOSIS — D631 Anemia in chronic kidney disease: Secondary | ICD-10-CM

## 2016-06-12 DIAGNOSIS — D638 Anemia in other chronic diseases classified elsewhere: Secondary | ICD-10-CM | POA: Diagnosis not present

## 2016-06-12 DIAGNOSIS — N184 Chronic kidney disease, stage 4 (severe): Principal | ICD-10-CM

## 2016-06-12 LAB — POCT HEMOGLOBIN-HEMACUE: HEMOGLOBIN: 11.7 g/dL — AB (ref 12.0–15.0)

## 2016-06-12 MED ORDER — EPOETIN ALFA 20000 UNIT/ML IJ SOLN
INTRAMUSCULAR | Status: AC
  Administered 2016-06-12: 20000 [IU] via SUBCUTANEOUS
  Filled 2016-06-12: qty 1

## 2016-06-12 MED ORDER — EPOETIN ALFA 20000 UNIT/ML IJ SOLN
20000.0000 [IU] | INTRAMUSCULAR | Status: DC
  Administered 2016-06-12: 20000 [IU] via SUBCUTANEOUS

## 2016-06-12 NOTE — Progress Notes (Signed)
Pt was scheduled to have numerous labs today at her appointment.  Pt told us that she had labs done with Dr Brigitte Pulse on11/15 and she saw Dr Moshe Cipro last week and she got the labs from Dr Brigitte Pulse.  I called [facility] and pole to stacy.  She spoke to Dr Moshe Cipro and she verified that the labs were done and we did not need to do any labs except for a hemocue.

## 2016-06-24 ENCOUNTER — Emergency Department (HOSPITAL_COMMUNITY): Payer: Medicare Other

## 2016-06-24 ENCOUNTER — Observation Stay (HOSPITAL_COMMUNITY)
Admission: EM | Admit: 2016-06-24 | Discharge: 2016-06-25 | Disposition: A | Discharge status: Home / Self Care | Payer: Medicare Other | Attending: Internal Medicine | Admitting: Internal Medicine

## 2016-06-24 ENCOUNTER — Encounter (HOSPITAL_COMMUNITY): Payer: Medicare Other

## 2016-06-24 ENCOUNTER — Encounter (HOSPITAL_COMMUNITY): Payer: Self-pay | Admitting: Emergency Medicine

## 2016-06-24 DIAGNOSIS — E785 Hyperlipidemia, unspecified: Secondary | ICD-10-CM | POA: Diagnosis present

## 2016-06-24 DIAGNOSIS — R0602 Shortness of breath: Secondary | ICD-10-CM | POA: Diagnosis not present

## 2016-06-24 DIAGNOSIS — E1122 Type 2 diabetes mellitus with diabetic chronic kidney disease: Secondary | ICD-10-CM | POA: Diagnosis not present

## 2016-06-24 DIAGNOSIS — E872 Acidosis, unspecified: Secondary | ICD-10-CM

## 2016-06-24 DIAGNOSIS — Z87891 Personal history of nicotine dependence: Secondary | ICD-10-CM | POA: Diagnosis not present

## 2016-06-24 DIAGNOSIS — Y999 Unspecified external cause status: Secondary | ICD-10-CM | POA: Diagnosis not present

## 2016-06-24 DIAGNOSIS — R930 Abnormal findings on diagnostic imaging of skull and head, not elsewhere classified: Secondary | ICD-10-CM | POA: Diagnosis not present

## 2016-06-24 DIAGNOSIS — N186 End stage renal disease: Secondary | ICD-10-CM | POA: Insufficient documentation

## 2016-06-24 DIAGNOSIS — N179 Acute kidney failure, unspecified: Secondary | ICD-10-CM | POA: Diagnosis not present

## 2016-06-24 DIAGNOSIS — E1129 Type 2 diabetes mellitus with other diabetic kidney complication: Secondary | ICD-10-CM | POA: Diagnosis not present

## 2016-06-24 DIAGNOSIS — I10 Essential (primary) hypertension: Secondary | ICD-10-CM | POA: Diagnosis present

## 2016-06-24 DIAGNOSIS — S299XXA Unspecified injury of thorax, initial encounter: Secondary | ICD-10-CM | POA: Diagnosis not present

## 2016-06-24 DIAGNOSIS — N189 Chronic kidney disease, unspecified: Secondary | ICD-10-CM | POA: Diagnosis not present

## 2016-06-24 DIAGNOSIS — N185 Chronic kidney disease, stage 5: Secondary | ICD-10-CM

## 2016-06-24 DIAGNOSIS — N178 Other acute kidney failure: Secondary | ICD-10-CM | POA: Diagnosis not present

## 2016-06-24 DIAGNOSIS — E162 Hypoglycemia, unspecified: Secondary | ICD-10-CM | POA: Diagnosis present

## 2016-06-24 DIAGNOSIS — R404 Transient alteration of awareness: Secondary | ICD-10-CM | POA: Diagnosis not present

## 2016-06-24 DIAGNOSIS — R531 Weakness: Secondary | ICD-10-CM | POA: Diagnosis not present

## 2016-06-24 DIAGNOSIS — Z79899 Other long term (current) drug therapy: Secondary | ICD-10-CM | POA: Diagnosis not present

## 2016-06-24 DIAGNOSIS — Z940 Kidney transplant status: Secondary | ICD-10-CM

## 2016-06-24 DIAGNOSIS — E119 Type 2 diabetes mellitus without complications: Secondary | ICD-10-CM

## 2016-06-24 DIAGNOSIS — I120 Hypertensive chronic kidney disease with stage 5 chronic kidney disease or end stage renal disease: Secondary | ICD-10-CM | POA: Insufficient documentation

## 2016-06-24 DIAGNOSIS — W06XXXA Fall from bed, initial encounter: Secondary | ICD-10-CM | POA: Diagnosis not present

## 2016-06-24 DIAGNOSIS — D509 Iron deficiency anemia, unspecified: Secondary | ICD-10-CM | POA: Diagnosis present

## 2016-06-24 DIAGNOSIS — E118 Type 2 diabetes mellitus with unspecified complications: Secondary | ICD-10-CM | POA: Diagnosis not present

## 2016-06-24 DIAGNOSIS — Y929 Unspecified place or not applicable: Secondary | ICD-10-CM | POA: Insufficient documentation

## 2016-06-24 DIAGNOSIS — Z794 Long term (current) use of insulin: Secondary | ICD-10-CM | POA: Diagnosis not present

## 2016-06-24 DIAGNOSIS — S0990XA Unspecified injury of head, initial encounter: Secondary | ICD-10-CM | POA: Diagnosis not present

## 2016-06-24 DIAGNOSIS — D631 Anemia in chronic kidney disease: Secondary | ICD-10-CM

## 2016-06-24 DIAGNOSIS — Y9389 Activity, other specified: Secondary | ICD-10-CM | POA: Insufficient documentation

## 2016-06-24 DIAGNOSIS — N184 Chronic kidney disease, stage 4 (severe): Secondary | ICD-10-CM | POA: Diagnosis present

## 2016-06-24 DIAGNOSIS — N2581 Secondary hyperparathyroidism of renal origin: Secondary | ICD-10-CM | POA: Diagnosis present

## 2016-06-24 DIAGNOSIS — I129 Hypertensive chronic kidney disease with stage 1 through stage 4 chronic kidney disease, or unspecified chronic kidney disease: Secondary | ICD-10-CM | POA: Diagnosis not present

## 2016-06-24 HISTORY — DX: Anemia, unspecified: D64.9

## 2016-06-24 LAB — URINALYSIS, ROUTINE W REFLEX MICROSCOPIC
BILIRUBIN URINE: NEGATIVE
Glucose, UA: NEGATIVE mg/dL
Hgb urine dipstick: NEGATIVE
Ketones, ur: NEGATIVE mg/dL
Leukocytes, UA: NEGATIVE
Nitrite: NEGATIVE
Protein, ur: 100 mg/dL — AB
SPECIFIC GRAVITY, URINE: 1.011 (ref 1.005–1.030)
pH: 5 (ref 5.0–8.0)

## 2016-06-24 LAB — MAGNESIUM: MAGNESIUM: 1.3 mg/dL — AB (ref 1.7–2.4)

## 2016-06-24 LAB — COMPREHENSIVE METABOLIC PANEL
ALK PHOS: 93 U/L (ref 38–126)
ALT: 10 U/L — ABNORMAL LOW (ref 14–54)
AST: 20 U/L (ref 15–41)
Albumin: 3.2 g/dL — ABNORMAL LOW (ref 3.5–5.0)
Anion gap: 7 (ref 5–15)
BUN: 48 mg/dL — ABNORMAL HIGH (ref 6–20)
CALCIUM: 9 mg/dL (ref 8.9–10.3)
CO2: 11 mmol/L — ABNORMAL LOW (ref 22–32)
CREATININE: 3.15 mg/dL — AB (ref 0.44–1.00)
Chloride: 124 mmol/L — ABNORMAL HIGH (ref 101–111)
GFR calc Af Amer: 17 mL/min — ABNORMAL LOW (ref >60)
GFR, EST NON AFRICAN AMERICAN: 15 mL/min — AB (ref >60)
Glucose, Bld: 49 mg/dL — ABNORMAL LOW (ref 65–99)
Potassium: 3.2 mmol/L — ABNORMAL LOW (ref 3.5–5.1)
Sodium: 142 mmol/L (ref 135–145)
Total Bilirubin: 0.4 mg/dL (ref 0.3–1.2)
Total Protein: 6 g/dL — ABNORMAL LOW (ref 6.5–8.1)

## 2016-06-24 LAB — CBG MONITORING, ED
GLUCOSE-CAPILLARY: 34 mg/dL — AB (ref 65–99)
GLUCOSE-CAPILLARY: 125 mg/dL — AB (ref 65–99)

## 2016-06-24 LAB — PHOSPHORUS: Phosphorus: 3 mg/dL (ref 2.5–4.6)

## 2016-06-24 LAB — GLUCOSE, CAPILLARY
GLUCOSE-CAPILLARY: 181 mg/dL — AB (ref 65–99)
GLUCOSE-CAPILLARY: 143 mg/dL — AB (ref 65–99)

## 2016-06-24 LAB — CBC WITH DIFFERENTIAL/PLATELET
BASOS ABS: 0 10*3/uL (ref 0.0–0.1)
BASOS PCT: 0 %
EOS ABS: 0.1 10*3/uL (ref 0.0–0.7)
Eosinophils Relative: 2 %
HCT: 35 % — ABNORMAL LOW (ref 36.0–46.0)
Hemoglobin: 11.4 g/dL — ABNORMAL LOW (ref 12.0–15.0)
Lymphocytes Relative: 6 %
Lymphs Abs: 0.4 10*3/uL — ABNORMAL LOW (ref 0.7–4.0)
MCH: 27.9 pg (ref 26.0–34.0)
MCHC: 32.6 g/dL (ref 30.0–36.0)
MCV: 85.6 fL (ref 78.0–100.0)
MONO ABS: 0.4 10*3/uL (ref 0.1–1.0)
MONOS PCT: 5 %
NEUTROS ABS: 6.1 10*3/uL (ref 1.7–7.7)
Neutrophils Relative %: 87 %
Platelets: 209 10*3/uL (ref 150–400)
RBC: 4.09 MIL/uL (ref 3.87–5.11)
RDW: 16 % — ABNORMAL HIGH (ref 11.5–15.5)
WBC: 7 10*3/uL (ref 4.0–10.5)

## 2016-06-24 LAB — I-STAT TROPONIN, ED
TROPONIN I, POC: 0.02 ng/mL (ref 0.00–0.08)
TROPONIN I, POC: 0.02 ng/mL (ref 0.00–0.08)

## 2016-06-24 LAB — I-STAT CG4 LACTIC ACID, ED
LACTIC ACID, VENOUS: 0.67 mmol/L (ref 0.5–1.9)
Lactic Acid, Venous: 0.45 mmol/L — ABNORMAL LOW (ref 0.5–1.9)

## 2016-06-24 LAB — CREATININE, URINE, RANDOM: CREATININE, URINE: 69.46 mg/dL

## 2016-06-24 LAB — BRAIN NATRIURETIC PEPTIDE: B NATRIURETIC PEPTIDE 5: 97.9 pg/mL (ref 0.0–100.0)

## 2016-06-24 LAB — SODIUM, URINE, RANDOM: Sodium, Ur: 70 mmol/L

## 2016-06-24 MED ORDER — LABETALOL HCL 200 MG PO TABS
300.0000 mg | ORAL_TABLET | Freq: Two times a day (BID) | ORAL | Status: DC
  Administered 2016-06-24: 300 mg via ORAL
  Filled 2016-06-24: qty 2

## 2016-06-24 MED ORDER — MYCOPHENOLATE MOFETIL 250 MG PO CAPS
250.0000 mg | ORAL_CAPSULE | Freq: Every day | ORAL | Status: DC
  Filled 2016-06-24: qty 1

## 2016-06-24 MED ORDER — PANTOPRAZOLE SODIUM 40 MG PO TBEC
40.0000 mg | DELAYED_RELEASE_TABLET | Freq: Every day | ORAL | Status: DC
  Administered 2016-06-24 – 2016-06-25 (×2): 40 mg via ORAL
  Filled 2016-06-24 (×2): qty 1

## 2016-06-24 MED ORDER — DEXTROSE 50 % IV SOLN
50.0000 mL | Freq: Once | INTRAVENOUS | Status: AC
  Administered 2016-06-24: 50 mL via INTRAVENOUS

## 2016-06-24 MED ORDER — INSULIN ASPART 100 UNIT/ML ~~LOC~~ SOLN: 0.0000 [IU] | SUBCUTANEOUS | Status: DC

## 2016-06-24 MED ORDER — LACTATED RINGERS IV BOLUS (SEPSIS)
1000.0000 mL | Freq: Once | INTRAVENOUS | Status: AC
  Administered 2016-06-24: 1000 mL via INTRAVENOUS

## 2016-06-24 MED ORDER — ONDANSETRON HCL 4 MG/2ML IJ SOLN: 4.0000 mg | Freq: Four times a day (QID) | INTRAMUSCULAR | Status: DC | PRN

## 2016-06-24 MED ORDER — MYCOPHENOLATE MOFETIL 250 MG PO CAPS
500.0000 mg | ORAL_CAPSULE | Freq: Two times a day (BID) | ORAL | Status: DC
  Administered 2016-06-24 – 2016-06-25 (×2): 500 mg via ORAL
  Filled 2016-06-24 (×2): qty 2

## 2016-06-24 MED ORDER — ACETAMINOPHEN 650 MG RE SUPP: 650.0000 mg | Freq: Four times a day (QID) | RECTAL | Status: DC | PRN

## 2016-06-24 MED ORDER — TACROLIMUS 1 MG PO CAPS
4.0000 mg | ORAL_CAPSULE | Freq: Every morning | ORAL | Status: DC
  Administered 2016-06-25: 4 mg via ORAL
  Filled 2016-06-24: qty 4

## 2016-06-24 MED ORDER — ONDANSETRON HCL 4 MG PO TABS: 4.0000 mg | ORAL_TABLET | Freq: Four times a day (QID) | ORAL | Status: DC | PRN

## 2016-06-24 MED ORDER — ENSURE ENLIVE PO LIQD
237.0000 mL | Freq: Two times a day (BID) | ORAL | Status: DC
  Administered 2016-06-25: 237 mL via ORAL

## 2016-06-24 MED ORDER — DEXTROSE 50 % IV SOLN
25.0000 mL | Freq: Once | INTRAVENOUS | Status: DC
  Filled 2016-06-24: qty 50

## 2016-06-24 MED ORDER — ACETAMINOPHEN 325 MG PO TABS: 650.0000 mg | ORAL_TABLET | Freq: Four times a day (QID) | ORAL | Status: DC | PRN

## 2016-06-24 MED ORDER — EZETIMIBE-SIMVASTATIN 10-20 MG PO TABS
1.0000 | ORAL_TABLET | Freq: Every day | ORAL | Status: DC
  Administered 2016-06-24: 1 via ORAL
  Filled 2016-06-24 (×2): qty 1

## 2016-06-24 MED ORDER — HEPARIN SODIUM (PORCINE) 5000 UNIT/ML IJ SOLN
5000.0000 [IU] | Freq: Three times a day (TID) | INTRAMUSCULAR | Status: DC
  Administered 2016-06-24 – 2016-06-25 (×2): 5000 [IU] via SUBCUTANEOUS
  Filled 2016-06-24: qty 1

## 2016-06-24 MED ORDER — STERILE WATER FOR INJECTION IV SOLN
INTRAVENOUS | Status: AC
  Administered 2016-06-24: 14:00:00 via INTRAVENOUS
  Filled 2016-06-24 (×2): qty 850

## 2016-06-24 MED ORDER — PREDNISONE 5 MG PO TABS
5.0000 mg | ORAL_TABLET | Freq: Every day | ORAL | Status: DC
  Administered 2016-06-25: 5 mg via ORAL
  Filled 2016-06-24 (×2): qty 1

## 2016-06-24 MED ORDER — AMLODIPINE BESYLATE 10 MG PO TABS
10.0000 mg | ORAL_TABLET | Freq: Every day | ORAL | Status: DC
  Administered 2016-06-24 – 2016-06-25 (×2): 10 mg via ORAL
  Filled 2016-06-24: qty 2
  Filled 2016-06-24: qty 1

## 2016-06-24 MED ORDER — LABETALOL HCL 200 MG PO TABS
200.0000 mg | ORAL_TABLET | Freq: Two times a day (BID) | ORAL | Status: DC
  Administered 2016-06-24 – 2016-06-25 (×2): 200 mg via ORAL
  Filled 2016-06-24 (×2): qty 1

## 2016-06-24 MED ORDER — SODIUM CHLORIDE 0.9 % IV BOLUS (SEPSIS)
1000.0000 mL | Freq: Once | INTRAVENOUS | Status: AC
  Administered 2016-06-24: 1000 mL via INTRAVENOUS

## 2016-06-24 MED ORDER — TACROLIMUS 1 MG PO CAPS
3.0000 mg | ORAL_CAPSULE | Freq: Every day | ORAL | Status: DC
  Administered 2016-06-24: 3 mg via ORAL
  Filled 2016-06-24: qty 3

## 2016-06-24 MED ORDER — MYCOPHENOLATE MOFETIL 250 MG PO CAPS
250.0000 mg | ORAL_CAPSULE | Freq: Two times a day (BID) | ORAL | Status: DC
  Filled 2016-06-24 (×2): qty 1

## 2016-06-24 NOTE — ED Triage Notes (Signed)
Pt stated she got out of bed; put feet down and toppled to ground. Kidney transplant 0962 without complicated. Had one episode of vomiting; now reporting she feels fine. CBG 78 per EMS. Cold like symptoms for past few days.

## 2016-06-24 NOTE — ED Notes (Signed)
Spoke with [facility] and informed them that pt. Was in the ER and would not be able to have shot in the office today. Spoke with ER MD regarding Dr. Young Berry being pt's kidney doctor and as advised by [facility] this is who we would need to contact regarding ordering the shot for pt. While in the hospital

## 2016-06-24 NOTE — Consult Note (Signed)
Reason for Consult:AKI/CKD Referring Physician: [facility]  Kimberly [name] is an 61 y.o. female.  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 Moshe Cipro.  She was last seen on 05/23/16 and her Scr was 2.2 at that time.  She has also developed anemia of chronic kidney disease and has been on procrit.    She presents to [facility] with a 3 week history of anorexia, malaise, and fatigue.  She also has had a productive cough of greenish-yellow sputum.  EMS was called to her home due to weakness and AMS.  CBG's were noted to be 30-49.  Here in the ED her labs were notable for Scr 3.15 and CO2 11.  We were consulted to further evaluate and manage her AKI/CKD as well as her immunosuppressive agents.  The trend in Scr is seen below.  Trend in Creatinine: Creatinine, Ser  Date/Time Value Ref Range Status  06/24/2016 09:56 AM 3.15 (H) 0.44 - 1.00 mg/dL Final  05/23/2016 2.2    04/14/2016 12:10 PM 2.45 (H) 0.44 - 1.00 mg/dL Final  01/07/2013 04:30 AM 0.98 0.50 - 1.10 mg/dL Final  01/06/2013 11:30 AM 1.44 (H) 0.50 - 1.10 mg/dL Final    PMH:   Past Medical History:  Diagnosis Date  . Adenomatous colon polyp 08/2007  . Diabetes mellitus type 2, controlled ([place])   . End stage renal disease (Kimberly [name])   . Goiter   . Gout   . Hyperlipidemia   . Hypertension   . Hyperthyroidism   . Kidney disease   . Osteopenia   . Seasonal allergies   . Vitamin B12 deficiency anemia due to intrinsic factor deficiency     PSH:   Past Surgical History:  Procedure Laterality Date  . GANGLION CYST EXCISION Right 1994  . KIDNEY TRANSPLANT  June 2010    Allergies:  Allergies  Allergen Reactions  . No Known Allergies     Medications:   Prior to Admission medications    Medication Sig Start Date End Date Taking? Authorizing Provider  ACCU-CHEK SMARTVIEW test strip 2 (two) times daily. Use twice daily as directed 11/21/15  Yes Historical Provider, MD  allopurinol (ZYLOPRIM) 100 MG tablet Take 100 mg by mouth daily.   Yes Historical Provider, MD  amLODipine (NORVASC) 10 MG tablet Take 10 mg by mouth daily.   Yes Historical Provider, MD  B-D ULTRAFINE III SHORT PEN 31G X 8 MM MISC Use twice daily as directed 11/22/15  Yes Historical Provider, MD  ezetimibe-simvastatin (VYTORIN) 10-20 MG per tablet Take 1 tablet by mouth at bedtime.   Yes Historical Provider, MD  hydrochlorothiazide (HYDRODIURIL) 25 MG tablet Take 25 mg by mouth daily.   Yes Historical Provider, MD  imiquimod (ALDARA) 5 % cream Apply 1 application topically 3 (three) times a week. 02/11/16  Yes Historical Provider, MD  insulin glargine (LANTUS) 100 UNIT/ML injection Inject 26 Units into the skin at bedtime.    Yes Historical Provider, MD  labetalol (NORMODYNE) 200 MG tablet Take 300 mg by mouth 2 (two) times daily.   Yes Historical Provider, MD  mycophenolate (CELLCEPT) 250 MG capsule Take by mouth 2 (two) times daily.   Yes Historical Provider, MD  omeprazole (PRILOSEC) 40 MG capsule Take 40 mg by mouth daily. 02/07/16  Yes  Historical Provider, MD  potassium chloride (K-DUR,KLOR-CON) 10 MEQ tablet Take 10 mEq by mouth daily.   Yes Historical Provider, MD  predniSONE (DELTASONE) 5 MG tablet Take 5 mg by mouth daily.   Yes Historical Provider, MD  PRESCRIPTION MEDICATION Med Name: ERYTHROPOIETIN INJECTION For: Low oxygen due to low red blood cell count Take one (1) injection into the muscle every two (2) weeks.   Yes Historical Provider, MD  tacrolimus (PROGRAF) 1 MG capsule Take 4 mg by mouth every morning. Take 3 mg by mouth each evening.   Yes Historical Provider, MD  Na Sulfate-K Sulfate-Mg Sulf 17.5-3.13-1.6 GM/180ML SOLN Take 1 kit by mouth once. Patient not taking: Reported on 06/24/2016 12/31/15    Ladene Artist, MD    Inpatient medications: . amLODipine  10 mg Oral Daily  . ezetimibe-simvastatin  1 tablet Oral QHS  . insulin aspart  0-9 Units Subcutaneous Q4H  . labetalol  200 mg Oral BID  . mycophenolate  250 mg Oral BID  . pantoprazole  40 mg Oral Daily  . predniSONE  5 mg Oral Daily    Discontinued Meds:   Medications Discontinued During This Encounter  Medication Reason  . ranitidine (ZANTAC) 150 MG capsule Patient Preference  . potassium chloride (K-DUR) 10 MEQ tablet Duplicate  . dextrose 50 % solution 25 mL   . glipiZIDE (GLUCOTROL) 10 MG tablet   . labetalol (NORMODYNE) tablet 300 mg     Social History:  reports that she quit smoking about 18 years ago. She has never used smokeless tobacco. She reports that she does not drink alcohol or use drugs.  Family History:   Family History  Problem Relation Age of Onset  . Colon cancer Sister   . Colon cancer Brother   . Heart disease Mother   . Cancer - Other Father     gum to throat    Pertinent items are noted in HPI. Weight change:   Intake/Output Summary (Last 24 hours) at 06/24/16 1427 Last data filed at 06/24/16 1256  Gross per 24 hour  Intake             1000 ml  Output                0 ml  Net             1000 ml   BP 150/73   Pulse 71   Temp 97.7 F (36.5 C) (Oral)   Resp 25   SpO2 95%  Vitals:   06/24/16 1141 06/24/16 1200 06/24/16 1245 06/24/16 1349  BP: 150/60 147/69 133/59 150/73  Pulse: 72 66 77 71  Resp: _0 Temp:      TempSrc:      SpO2: 100% 100% 95%      General appearance: alert, cooperative and no distress Head: Normocephalic, without obvious abnormality, atraumatic Resp: clear to auscultation bilaterally Cardio: regular rate and rhythm, S1, S2 normal, no murmur, click, rub or gallop GI: soft, non-tender; bowel sounds normal; no masses,  no organomegaly and renal allograft in RLQ nontender and no bruits Extremities: extremities normal, atraumatic, no cyanosis or  edema and LUE AVF +T/B  Labs: Basic Metabolic Panel:  Recent Labs Lab 06/24/16 0956  NA 142  K 3.2*  CL 124*  CO2 11*  GLUCOSE 49*  BUN 48*  CREATININE 3.15*  ALBUMIN 3.2*  CALCIUM 9.0   Liver Function Tests:  Recent Labs Lab 06/24/16 0956  AST 20  ALT 10*  ALKPHOS 93  BILITOT 0.4  PROT 6.0*  ALBUMIN 3.2*   No results for input(s): LIPASE, AMYLASE in the last 168 hours. No results for input(s): AMMONIA in the last 168 hours. CBC:  Recent Labs Lab 06/24/16 0956  WBC 7.0  NEUTROABS 6.1  HGB 11.4*  HCT 35.0*  MCV 85.6  PLT 209   PT/INR: _0 (inr:5) Cardiac Enzymes: )No results for input(s): CKTOTAL, CKMB, CKMBINDEX, TROPONINI in the last 168 hours. CBG:  Recent Labs Lab 06/24/16 1301 06/24/16 1349  GLUCAP 34* 125*    Iron Studies: No results for input(s): IRON, TIBC, TRANSFERRIN, FERRITIN in the last 168 hours.  Xrays/Other Studies: Dg Chest 2 View  Result Date: 06/24/2016 CLINICAL DATA:  Status post fall when arising from bedthis morning. Patient ports an episode of vomiting but no other symptoms now. EXAM: CHEST  2 VIEW COMPARISON:  Chest x-ray of August 04, 2005 FINDINGS: The lungs are well-expanded. There is no focal infiltrate. There is mild hemidiaphragm flattening on the lateral view. The heart and pulmonary vascularity are normal. There is calcification in the wall of the aortic arch. There is no pleural effusion. There is multilevel degenerative disc disease of the thoracic spine. IMPRESSION: No acute cardiopulmonary abnormality. Probable chronic bronchitic changes. Electronically Signed   By: David  Martinique M.D.   On: 06/24/2016 10:28   Ct Head Wo Contrast  Result Date: 06/24/2016 CLINICAL DATA:  Fall this morning. EXAM: CT HEAD WITHOUT CONTRAST TECHNIQUE: Contiguous axial images were obtained from the base of the skull through the vertex without intravenous contrast. COMPARISON:  None. FINDINGS: Brain: No acute intracranial abnormality.  Specifically, no hemorrhage, hydrocephalus, mass lesion, acute infarction, or significant intracranial injury. Vascular: No hyperdense vessel or unexpected calcification. Skull: No acute calvarial abnormality. Sinuses/Orbits: Mucosal thickening throughout the paranasal sinuses. Mastoid air cells are clear. Orbital soft tissues unremarkable. Other:  None IMPRESSION: No intracranial abnormality. Chronic sinusitis. Electronically Signed   By: Rolm Baptise M.D.   On: 06/24/2016 11:22     Assessment/Plan: 1.  AKI/CKD- presumably due to volume depletion (h/o anorexia, N/V/D).  Agree with IV bicarb and follow UOP and daily Scr.   2. Metabolic acidosis- isotonic bicarb and follow. 3. DM with symptomatic hypoglycemia.  Med adjustment per primary svc. 4. AMS likely due to low glucose, CT scan negative 5. S/p DDKT with allograft dysfunction- continue with cellcept, prednisone, and prograf 6. HTN- stable 7. Anemia of chronic kidney disease-  stable   Donetta Potts 06/24/2016, 2:27 PM

## 2016-06-24 NOTE — ED Notes (Signed)
Got patient into a gown and on the monitor 

## 2016-06-24 NOTE — ED Notes (Signed)
Dr. Marily Memos made aware of pt's Labetalol dose of only 200 mg

## 2016-06-24 NOTE — ED Notes (Signed)
Patient transported to X-ray 

## 2016-06-24 NOTE — ED Provider Notes (Signed)
[facility] DEPT Provider Note   CSN: [id number] Arrival date & time: 06/24/16  0950     History   Chief Complaint Chief Complaint  Patient presents with  . Weakness    HPI Kimberly [name] is a 61 y.o. female.  HPI 19 old female with history of end-stage renal disease status post renal transplant, hypertension, hyperlipidemia, who presents with generalized weakness. According to the patient, for the last 2 weeks, she has had persistent cough that is now productive of yellow green and white sputum. Over the last 2 days, she has had increased shortness of breath as well as difficulty getting her sputum up. Earlier this morning, she began coughing significantly in bed and requested help getting out of bed. She was unable to get out of bed due to weakness. She is also mildly more confused than usual according to the husband. When he left the room, he came back and saw that she was halfway off the bed and was unable to get totally off of it. He helped her back to the bed and this happened again. She says she feels generally weak and unable stand due to this. She endorses cough shortness of breath as well as mild sore throat. Denies any other medical complaints.  Past Medical History:  Diagnosis Date  . Adenomatous colon polyp 08/2007  . Diabetes mellitus type 2, controlled ([place])   . End stage renal disease ([place])   . Goiter   . Gout   . Hyperlipidemia   . Hypertension   . Hyperthyroidism   . Kidney disease   . Osteopenia   . Seasonal allergies   . Vitamin B12 deficiency anemia due to intrinsic factor deficiency     Patient Active Problem List   Diagnosis Date Noted  . Diabetes mellitus type 2, insulin dependent ([place]) 06/24/2016  . Hypoglycemia 06/24/2016  . Generalized weakness 06/24/2016  . CKD (chronic kidney disease) stage 4, GFR 15-29 ml/min ([facility]) 06/24/2016  . Diabetes mellitus with complication ([place])   . Anemia in chronic renal disease 02/12/2016  . Dyspnea 02/11/2016  .  Iron deficiency anemia 02/05/2016  . Encounter for other preprocedural examination 08/28/2015  . Hyperparathyroidism due to renal insufficiency ([place]) 08/21/2015  . Secondary diabetes mellitus ([place]) 08/21/2015  . Immunosuppression ([place]) 08/21/2015  . HLD (hyperlipidemia) 08/21/2015  . Essential (primary) hypertension 08/21/2015  . History of kidney transplant 08/21/2015  . Absolute anemia 08/21/2015  . Blood in stool 01/06/2013    Past Surgical History:  Procedure Laterality Date  . GANGLION CYST EXCISION Right 1994  . KIDNEY TRANSPLANT  June 2010    OB History    No data available       Home Medications    Prior to Admission medications   Medication Sig Start Date End Date Taking? Authorizing Provider  ACCU-CHEK SMARTVIEW test strip 2 (two) times daily. Use twice daily as directed 11/21/15  Yes Historical Provider, MD  allopurinol (ZYLOPRIM) 100 MG tablet Take 100 mg by mouth daily.   Yes Historical Provider, MD  amLODipine (NORVASC) 10 MG tablet Take 10 mg by mouth daily.   Yes Historical Provider, MD  B-D ULTRAFINE III SHORT PEN 31G X 8 MM MISC Use twice daily as directed 11/22/15  Yes Historical Provider, MD  ezetimibe-simvastatin (VYTORIN) 10-20 MG per tablet Take 1 tablet by mouth at bedtime.   Yes Historical Provider, MD  hydrochlorothiazide (HYDRODIURIL) 25 MG tablet Take 25 mg by mouth daily.   Yes Historical Provider, MD  imiquimod (  ALDARA) 5 % cream Apply 1 application topically 3 (three) times a week. 02/11/16  Yes Historical Provider, MD  insulin glargine (LANTUS) 100 UNIT/ML injection Inject 26 Units into the skin at bedtime.    Yes Historical Provider, MD  labetalol (NORMODYNE) 200 MG tablet Take 300 mg by mouth 2 (two) times daily.   Yes Historical Provider, MD  mycophenolate (CELLCEPT) 250 MG capsule Take 500 mg by mouth 2 (two) times daily.    Yes Historical Provider, MD  omeprazole (PRILOSEC) 40 MG capsule Take 40 mg by mouth daily. 02/07/16  Yes Historical  Provider, MD  potassium chloride (K-DUR,KLOR-CON) 10 MEQ tablet Take 10 mEq by mouth daily.   Yes Historical Provider, MD  predniSONE (DELTASONE) 5 MG tablet Take 5 mg by mouth daily.   Yes Historical Provider, MD  PRESCRIPTION MEDICATION Med Name: ERYTHROPOIETIN INJECTION For: Low oxygen due to low red blood cell count Take one (1) injection into the muscle every two (2) weeks.   Yes Historical Provider, MD  tacrolimus (PROGRAF) 1 MG capsule Take 4 mg by mouth every morning. Take 3 mg by mouth each evening.   Yes Historical Provider, MD  Na Sulfate-K Sulfate-Mg Sulf 17.5-3.13-1.6 GM/180ML SOLN Take 1 kit by mouth once. Patient not taking: Reported on 06/24/2016 12/31/15   Ladene Artist, MD    Family History Family History  Problem Relation Age of Onset  . Colon cancer Sister   . Colon cancer Brother   . Heart disease Mother   . Cancer - Other Father     gum to throat    Social History Social History  Substance Use Topics  . Smoking status: Former Smoker    Quit date: 07/09/1997  . Smokeless tobacco: Never Used  . Alcohol use No     Allergies   No known allergies   Review of Systems Review of Systems  Constitutional: Positive for fatigue. Negative for chills and fever.  HENT: Negative for congestion and rhinorrhea.   Eyes: Negative for visual disturbance.  Respiratory: Positive for cough and shortness of breath. Negative for wheezing.   Cardiovascular: Negative for chest pain and leg swelling.  Gastrointestinal: Negative for abdominal pain, diarrhea, nausea and vomiting.  Genitourinary: Negative for dysuria and flank pain.  Musculoskeletal: Negative for neck pain and neck stiffness.  Skin: Negative for rash and wound.  Allergic/Immunologic: Negative for immunocompromised state.  Neurological: Negative for syncope, weakness and headaches.  All other systems reviewed and are negative.    Physical Exam Updated Vital Signs BP 150/73   Pulse 71   Temp 97.7 F (36.5  C) (Oral)   Resp 25   SpO2 95%   Physical Exam  Constitutional: She is oriented to person, place, and time. She appears well-developed and well-nourished. No distress.  HENT:  Head: Normocephalic and atraumatic.  Eyes: Conjunctivae are normal.  Neck: Neck supple.  Cardiovascular: Normal rate, regular rhythm and normal heart sounds.  Exam reveals no friction rub.   No murmur heard. Pulmonary/Chest: Effort normal and breath sounds normal. No respiratory distress. She has no wheezes. She has no rales.  Abdominal: She exhibits no distension.  Musculoskeletal: She exhibits no edema.  Neurological: She is alert and oriented to person, place, and time. She exhibits normal muscle tone.  Skin: Skin is warm. Capillary refill takes less than 2 seconds.  Psychiatric: She has a normal mood and affect.  Nursing note and vitals reviewed.    ED Treatments / Results  Labs (all labs ordered  are listed, but only abnormal results are displayed) Labs Reviewed  CBC WITH DIFFERENTIAL/PLATELET - Abnormal; Notable for the following:       Result Value   Hemoglobin 11.4 (*)    HCT 35.0 (*)    RDW 16.0 (*)    Lymphs Abs 0.4 (*)    All other components within normal limits  COMPREHENSIVE METABOLIC PANEL - Abnormal; Notable for the following:    Potassium 3.2 (*)    Chloride 124 (*)    CO2 11 (*)    Glucose, Bld 49 (*)    BUN 48 (*)    Creatinine, Ser 3.15 (*)    Total Protein 6.0 (*)    Albumin 3.2 (*)    ALT 10 (*)    GFR calc non Af Amer 15 (*)    GFR calc Af Amer 17 (*)    All other components within normal limits  URINALYSIS, ROUTINE W REFLEX MICROSCOPIC - Abnormal; Notable for the following:    APPearance HAZY (*)    Protein, ur 100 (*)    Bacteria, UA RARE (*)    Squamous Epithelial / LPF 0-5 (*)    All other components within normal limits  I-STAT CG4 LACTIC ACID, ED - Abnormal; Notable for the following:    Lactic Acid, Venous 0.45 (*)    All other components within normal limits    CBG MONITORING, ED - Abnormal; Notable for the following:    Glucose-Capillary 34 (*)    All other components within normal limits  CBG MONITORING, ED - Abnormal; Notable for the following:    Glucose-Capillary 125 (*)    All other components within normal limits  BRAIN NATRIURETIC PEPTIDE  TACROLIMUS LEVEL  HEMOGLOBIN A1C  SODIUM, URINE, RANDOM  CREATININE, URINE, RANDOM  RENAL FUNCTION PANEL  CBC  I-STAT TROPOININ, ED  I-STAT CG4 LACTIC ACID, ED  Randolm Idol, ED    EKG  EKG Interpretation  Date/Time:  Tuesday June 24 2016 09:56:17 EST Ventricular Rate:  66 PR Interval:    QRS Duration: 89 QT Interval:  408 QTC Calculation: 428 R Axis:   55 Text Interpretation:  Sinus rhythm Probable left atrial enlargement Probable left ventricular hypertrophy Abnormal T, consider ischemia, lateral leads Minimal ST elevation, inferior leads Since last EKG, <2 mm ST changes in inferior leads TWI in leads I, aVL, do not appear acute Confirmed by Ellender Hose MD, Lysbeth Galas [phone]) on 06/24/2016 9:59:41 AM Also confirmed by Ellender Hose MD, Bron Snellings [phone]), editor St[place] CT, Leda Gauze [phone])  on 06/24/2016 10:09:37 AM       Radiology Dg Chest 2 View  Result Date: 06/24/2016 CLINICAL DATA:  Status post fall when arising from bedthis morning. Patient ports an episode of vomiting but no other symptoms now. EXAM: CHEST  2 VIEW COMPARISON:  Chest x-ray of August 04, 2005 FINDINGS: The lungs are well-expanded. There is no focal infiltrate. There is mild hemidiaphragm flattening on the lateral view. The heart and pulmonary vascularity are normal. There is calcification in the wall of the aortic arch. There is no pleural effusion. There is multilevel degenerative disc disease of the thoracic spine. IMPRESSION: No acute cardiopulmonary abnormality. Probable chronic bronchitic changes. Electronically Signed   By: David  Martinique M.D.   On: 06/24/2016 10:28   Ct Head Wo Contrast  Result Date: 06/24/2016 CLINICAL DATA:   Fall this morning. EXAM: CT HEAD WITHOUT CONTRAST TECHNIQUE: Contiguous axial images were obtained from the base of the skull through the vertex without intravenous contrast. COMPARISON:  None. FINDINGS: Brain: No acute intracranial abnormality. Specifically, no hemorrhage, hydrocephalus, mass lesion, acute infarction, or significant intracranial injury. Vascular: No hyperdense vessel or unexpected calcification. Skull: No acute calvarial abnormality. Sinuses/Orbits: Mucosal thickening throughout the paranasal sinuses. Mastoid air cells are clear. Orbital soft tissues unremarkable. Other:  None IMPRESSION: No intracranial abnormality. Chronic sinusitis. Electronically Signed   By: Rolm Baptise M.D.   On: 06/24/2016 11:22    Procedures Procedures (including critical care time)  Medications Ordered in ED Medications  sodium bicarbonate 150 mEq in sterile water 1,000 mL infusion ( Intravenous New Bag/Given 06/24/16 1351)  insulin aspart (novoLOG) injection 0-9 Units (0 Units Subcutaneous Not Given 06/24/16 1314)  amLODipine (NORVASC) tablet 10 mg (10 mg Oral Given 06/24/16 1350)  ezetimibe-simvastatin (VYTORIN) 10-20 MG per tablet 1 tablet (not administered)  pantoprazole (PROTONIX) EC tablet 40 mg (40 mg Oral Given 06/24/16 1350)  predniSONE (DELTASONE) tablet 5 mg (5 mg Oral Not Given 06/24/16 1505)  labetalol (NORMODYNE) tablet 200 mg (not administered)  mycophenolate (CELLCEPT) capsule 500 mg (not administered)  tacrolimus (PROGRAF) capsule 4 mg (not administered)  tacrolimus (PROGRAF) capsule 3 mg (not administered)  sodium chloride 0.9 % bolus 1,000 mL (0 mLs Intravenous Stopped 06/24/16 1256)  lactated ringers bolus 1,000 mL (0 mLs Intravenous Stopped 06/24/16 1358)  dextrose 50 % solution 50 mL (50 mLs Intravenous Given 06/24/16 1303)     Initial Impression / Assessment and Plan / ED Course  I have reviewed the triage vital signs and the nursing notes.  Pertinent labs & imaging results that were  available during my care of the patient were reviewed by me and considered in my medical decision making (see chart for details).  Clinical Course     61 yo F with PMHx as above including ESRD s/p renal transplant, no longer on HD, who p/w generalized fatigue and malaise. On arrival, VSS and WNL. Exam is as above. Pt mildly dehydrated clinically. Lab work as above, remarkable for mild hypoglycemia, significant acidemia with AKI. No evidence of UTI. CBC at baseline and pt has had no fever or infectious sx. CT head and CXR negative. Suspect AoCKD with acidemia, likely pre-renal though must consider transplant failure. Will start bicarb gtt, admit to medicine. 2L bolus given.  Final Clinical Impressions(s) / ED Diagnoses   Final diagnoses:  Acute renal failure superimposed on chronic kidney disease, unspecified CKD stage, unspecified acute renal failure type ([place])  Acidemia    New Prescriptions New Prescriptions   No medications on file     Duffy Bruce, MD 06/24/16 1749

## 2016-06-24 NOTE — ED Notes (Addendum)
Called and spoke with lab about CMP results they area running now

## 2016-06-24 NOTE — ED Notes (Addendum)
Pt stood up and used the bedside commode appears unsteady on her feet though was able to turn and pivot

## 2016-06-24 NOTE — H&P (Signed)
History and Physical    Kimberly [name] YWV:[id number] DOB: 07-22-1955 DOA: 06/24/2016   PCP: Marton Redwood, MD   Patient coming from/Resides with: Private residence/lives with husband  Admission status: Observation/floor -it may be medically necessary to stay a minimum 2 midnights to rule out impending and/or unexpected changes in physiologic status that may differ from initial evaluation performed in the ER and/or at time of admission therefore please consider reevaluation of admission status in the next 24 hours.   Chief Complaint: Generalized weakness  HPI: Kimberly [name] is a 61 y.o. female with medical history significant for gout, dyslipidemia, chronic kidney disease status post renal transplant 2010, diabetes on insulin, hypertension and anemia of chronic kidney disease requiring erythropoietin and iron infusions. who presents with reports of generalized weakness. For about 3 weeks the patient has had poor oral intake and has had intermittent symptoms of upper respiratory infection with productive cough of greenish yellow sputum. Respiratory symptoms have improved and patient denies current shortness of breath. Today when attempting to get out of bed patient was so weak she was unable to stand and seemed to be somewhat confused and slow to respond to questions asked. EMS was called to the home and patient remained weak. In the ER patient's CBGs were low between 30 and 49.   ED Course:  Vital Signs: BP 150/73   Pulse 71   Temp 97.7 F (36.5 C) (Oral)   Resp 25   SpO2 95%  2 view chest x-ray: No acute process CT head without contrast: No acute intracranial abnormality Lab data: Sodium 142, potassium 3.2, chloride 124, CO2 11, BUN 48, creatinine 3.15, glucose 49, albumin 3.2, total protein 6.0, BNP 98,poc troponin 0.02, lactic acid 0.45 and 0.67, white count 7000 with neutrophils 87% and absolutely feel 6.1%, hemoglobin 11.4, platelets 209,000, urinalysis hazy with rare bacteria, 100 of  protein otherwise unremarkable Medications and treatments: Normal saline bolus 1 L, lactated Ringer bolus 1 L, sodium bicarbonate infusion with 150 mEq per 1 L 10 hours initiated in the ER  Review of Systems:  In addition to the HPI above,  No Fever-chills, myalgias or other constitutional symptoms No Headache, changes with Vision or hearing, new weakness, tingling, numbness in any extremity, dizziness, dysarthria or word finding difficulty, tremors or seizure activity No problems swallowing food or Liquids, indigestion/reflux, choking or coughing while eating, abdominal pain with or after eating-chronic diarrhea since September although has improved with initiation of PPI No Chest pain, palpitations, orthopnea or DOE No Abdominal pain, N/V, melena,hematochezia, dark tarry stools, constipation No dysuria, malodorous urine, hematuria or flank pain No new skin rashes, lesions, masses or bruises, No new joint pains, aches, swelling or redness No recent unintentional weight gain or loss No polyuria, polydypsia or polyphagia   Past Medical History:  Diagnosis Date  . Adenomatous colon polyp 08/2007  . Diabetes mellitus type 2, controlled ([place])   . End stage renal disease ([place])   . Goiter   . Gout   . Hyperlipidemia   . Hypertension   . Hyperthyroidism   . Kidney disease   . Osteopenia   . Seasonal allergies   . 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 GANGLION CYST EXCISION Right 1994  . KIDNEY TRANSPLANT  June 2010    Social History   Social History  . Marital status: Married    Spouse name: N/A  . Number of  children: 3  . Years of education: N/A   Occupational History  . Not on file.   Social History Main Topics  . Smoking status: Former Smoker    Quit date: 07/09/1997  . Smokeless tobacco: Never Used  . Alcohol use No  . Drug use: No  . Sexual activity: Not on file   Other Topics  Concern  . Not on file   Social History Narrative  . No narrative on file    Mobility: Without assistive devices Work history: Not obtained   Allergies  Allergen Reactions  . No Known Allergies     Family History  Problem Relation Age of Onset  . Colon cancer Sister   . Colon cancer Brother   . Heart disease Mother   . Cancer - Other Father     gum to throat     Prior to Admission medications   Medication Sig Start Date End Date Taking? Authorizing Provider  ACCU-CHEK SMARTVIEW test strip 2 (two) times daily. Use twice daily as directed 11/21/15  Yes Historical Provider, MD  allopurinol (ZYLOPRIM) 100 MG tablet Take 100 mg by mouth daily.   Yes Historical Provider, MD  amLODipine (NORVASC) 10 MG tablet Take 10 mg by mouth daily.   Yes Historical Provider, MD  B-D ULTRAFINE III SHORT PEN 31G X 8 MM MISC Use twice daily as directed 11/22/15  Yes Historical Provider, MD  ezetimibe-simvastatin (VYTORIN) 10-20 MG per tablet Take 1 tablet by mouth at bedtime.   Yes Historical Provider, MD  hydrochlorothiazide (HYDRODIURIL) 25 MG tablet Take 25 mg by mouth daily.   Yes Historical Provider, MD  imiquimod (ALDARA) 5 % cream Apply 1 application topically 3 (three) times a week. 02/11/16  Yes Historical Provider, MD  insulin glargine (LANTUS) 100 UNIT/ML injection Inject 26 Units into the skin at bedtime.    Yes Historical Provider, MD  labetalol (NORMODYNE) 200 MG tablet Take 300 mg by mouth 2 (two) times daily.   Yes Historical Provider, MD  mycophenolate (CELLCEPT) 250 MG capsule Take by mouth 2 (two) times daily.   Yes Historical Provider, MD  omeprazole (PRILOSEC) 40 MG capsule Take 40 mg by mouth daily. 02/07/16  Yes Historical Provider, MD  potassium chloride (K-DUR,KLOR-CON) 10 MEQ tablet Take 10 mEq by mouth daily.   Yes Historical Provider, MD  predniSONE (DELTASONE) 5 MG tablet Take 5 mg by mouth daily.   Yes Historical Provider, MD  PRESCRIPTION MEDICATION Med Name:  ERYTHROPOIETIN INJECTION For: Low oxygen due to low red blood cell count Take one (1) injection into the muscle every two (2) weeks.   Yes Historical Provider, MD  tacrolimus (PROGRAF) 1 MG capsule Take 4 mg by mouth every morning. Take 3 mg by mouth each evening.   Yes Historical Provider, MD  Na Sulfate-K Sulfate-Mg Sulf 17.5-3.13-1.6 GM/180ML SOLN Take 1 kit by mouth once. Patient not taking: Reported on 06/24/2016 12/31/15   Ladene Artist, MD    Physical Exam: Vitals:   06/24/16 1141 06/24/16 1200 06/24/16 1245 06/24/16 1349  BP: 150/60 147/69 133/59 150/73  Pulse: 72 66 77 71  Resp: _0 Temp:      TempSrc:      SpO2: 100% 100% 95%       Constitutional: NAD, calm, comfortable Eyes: PERRL, lids and conjunctivae normal ENMT: Mucous membranes are moist. Posterior pharynx clear of any exudate or lesions.Normal dentition.  Neck: normal, supple, no masses, no thyromegaly Respiratory: clear to auscultation bilaterally,  no wheezing, no crackles. Normal respiratory effort. No accessory muscle use.  Cardiovascular: Regular rate and rhythm, no murmurs / rubs / gallops. No extremity edema. 2+ pedal pulses. No carotid bruits.  Abdomen: no tenderness, no masses palpated. No hepatosplenomegaly. Bowel sounds positive.  Musculoskeletal: no clubbing / cyanosis. No joint deformity upper and lower extremities. Good ROM, no contractures. Normal muscle tone.  Skin: no rashes, lesions, ulcers. No induration Neurologic: CN 2-12 grossly intact. Sensation intact, DTR normal. Strength 5/5 x all 4 extremities.  Psychiatric: Normal judgment and insight. Alert and oriented x 3. Normal mood.    Labs on Admission: I have personally reviewed following labs and imaging studies  CBC:  Recent Labs Lab 06/24/16 0956  WBC 7.0  NEUTROABS 6.1  HGB 11.4*  HCT 35.0*  MCV 85.6  PLT 478   Basic Metabolic Panel:  Recent Labs Lab 06/24/16 0956  NA 142  K 3.2*  CL 124*  CO2 11*  GLUCOSE 49*  BUN  48*  CREATININE 3.15*  CALCIUM 9.0   GFR: CrCl cannot be calculated (Unknown ideal weight.). Liver Function Tests:  Recent Labs Lab 06/24/16 0956  AST 20  ALT 10*  ALKPHOS 93  BILITOT 0.4  PROT 6.0*  ALBUMIN 3.2*   No results for input(s): LIPASE, AMYLASE in the last 168 hours. No results for input(s): AMMONIA in the last 168 hours. Coagulation Profile: No results for input(s): INR, PROTIME in the last 168 hours. Cardiac Enzymes: No results for input(s): CKTOTAL, CKMB, CKMBINDEX, TROPONINI in the last 168 hours. BNP (last 3 results) No results for input(s): PROBNP in the last 8760 hours. HbA1C: No results for input(s): HGBA1C in the last 72 hours. CBG:  Recent Labs Lab 06/24/16 1301 06/24/16 1349  GLUCAP 34* 125*   Lipid Profile: No results for input(s): CHOL, HDL, LDLCALC, TRIG, CHOLHDL, LDLDIRECT in the last 72 hours. Thyroid Function Tests: No results for input(s): TSH, T4TOTAL, FREET4, T3FREE, THYROIDAB in the last 72 hours. Anemia Panel: No results for input(s): VITAMINB12, FOLATE, FERRITIN, TIBC, IRON, RETICCTPCT in the last 72 hours. Urine analysis:    Component Value Date/Time   COLORURINE YELLOW 06/24/2016 1154   APPEARANCEUR HAZY (A) 06/24/2016 1154   LABSPEC 1.011 06/24/2016 1154   PHURINE 5.0 06/24/2016 1154   GLUCOSEU NEGATIVE 06/24/2016 1154   HGBUR NEGATIVE 06/24/2016 1154   BILIRUBINUR NEGATIVE 06/24/2016 1154   KETONESUR NEGATIVE 06/24/2016 1154   PROTEINUR 100 (A) 06/24/2016 1154   NITRITE NEGATIVE 06/24/2016 1154   LEUKOCYTESUR NEGATIVE 06/24/2016 1154   Sepsis Labs: _0 (procalcitonin:4,lacticidven:4) )No results found for this or any previous visit (from the past 240 hour(s)).   Radiological Exams on Admission: Dg Chest 2 View  Result Date: 06/24/2016 CLINICAL DATA:  Status post fall when arising from bedthis morning. Patient ports an episode of vomiting but no other symptoms now. EXAM: CHEST  2 VIEW COMPARISON:  Chest x-ray of  August 04, 2005 FINDINGS: The lungs are well-expanded. There is no focal infiltrate. There is mild hemidiaphragm flattening on the lateral view. The heart and pulmonary vascularity are normal. There is calcification in the wall of the aortic arch. There is no pleural effusion. There is multilevel degenerative disc disease of the thoracic spine. IMPRESSION: No acute cardiopulmonary abnormality. Probable chronic bronchitic changes. Electronically Signed   By: David  Martinique M.D.   On: 06/24/2016 10:28   Ct Head Wo Contrast  Result Date: 06/24/2016 CLINICAL DATA:  Fall this morning. EXAM: CT HEAD WITHOUT CONTRAST TECHNIQUE: Contiguous axial images  were obtained from the base of the skull through the vertex without intravenous contrast. COMPARISON:  None. FINDINGS: Brain: No acute intracranial abnormality. Specifically, no hemorrhage, hydrocephalus, mass lesion, acute infarction, or significant intracranial injury. Vascular: No hyperdense vessel or unexpected calcification. Skull: No acute calvarial abnormality. Sinuses/Orbits: Mucosal thickening throughout the paranasal sinuses. Mastoid air cells are clear. Orbital soft tissues unremarkable. Other:  None IMPRESSION: No intracranial abnormality. Chronic sinusitis. Electronically Signed   By: Rolm Baptise M.D.   On: 06/24/2016 11:22    EKG: (Independently reviewed) sinus rhythm with ventricular rate 66 bpm, QTC 428 ms, voltage criteria borderline for LVH, no ischemic changes  Assessment/Plan Principal Problem:   Generalized weakness -Patient presents with progressive poor oral intake 3 weeks culminating in generalized weakness and gait disturbance this morning. -Likely multifactorial in etiology related to hypoglycemia and progressive renal dysfunction -Treat underlying problems -PT/OT evaluation -Was not orthostatic  Active Problems:   Diabetes mellitus type 2, insulin dependent/Hypoglycemia -Patient hypoglycemic with CBG 49 at presentation further  decreased to 30 when rechecked -Suspect hypoglycemia primary etiology to generalized weakness symptoms -Discontinue preadmission Glucotrol and setting of severe stage for almost stage V chronic kidney disease -Checks CBGs every 2 hours -D50 1 amp 1 now and allow diet -If hypoglycemia persists likely will require D10 infusion -Hold home insulin/Lantus -Hemoglobin A1c    Essential (primary) hypertension -Continue preadmission Norvasc and labetalol noting dosage adjustment from 300-200 twice a day by PCP recently -Hold hydrochlorothiazide    History of kidney transplant/CKD (chronic kidney disease) stage 4, GFR 15-29 ml/min  -Renal transplant in 2010 on prednisone, CellCept (continue)-hold Prograf until tacrolimus level returned -With recent generalized weakness check tacrolimus level -Renal function 2014: 0.98 -Renal function October 2017: 2.45 -Current renal function: 3.15 -Has associated non-anion gap metabolic acidosis so agree with ER physician and will begin bicarbonate infusion for a total of 10 hours -Repeat labs in a.m. -Check magnesium and phosphorus -Renal consultation -?? Discharge on oral bicarbonate -Patient reports adequate urine output -Thiazide diuretic on hold-?? Transitioned to Lasix    Hyperparathyroidism due to renal insufficiency  -Not on metabolic agents prior to admission-defer to nephrology    HLD (hyperlipidemia) -Continue Vytorin    Iron deficiency anemia/Anemia in chronic renal disease -Patient very symptomatic in August 2017 with a hemoglobin of 17 prompting initiation of erythropoietin and periodic iron infusions -Current hemoglobin stable at 11      DVT prophylaxis: Subcutaneous heparin Code Status: Full Family Communication: Husband at bedside Disposition Plan: Anticipate discharge back to preadmission home environment when medically stable Consults called: Nephrology/[place]    Kimberly [name] L. ANP-BC [facility] Pager  [phone]   If 7PM-7AM, please contact night-coverage [email or web address] Password [email or web address]  06/24/2016, 2:20 PM

## 2016-06-24 NOTE — ED Notes (Signed)
Checked patient blood sugar it was 34 notified RN Alexis of low blood sugar

## 2016-06-25 DIAGNOSIS — I129 Hypertensive chronic kidney disease with stage 1 through stage 4 chronic kidney disease, or unspecified chronic kidney disease: Secondary | ICD-10-CM | POA: Diagnosis not present

## 2016-06-25 DIAGNOSIS — N178 Other acute kidney failure: Secondary | ICD-10-CM | POA: Diagnosis not present

## 2016-06-25 DIAGNOSIS — R531 Weakness: Secondary | ICD-10-CM | POA: Diagnosis not present

## 2016-06-25 DIAGNOSIS — N189 Chronic kidney disease, unspecified: Secondary | ICD-10-CM | POA: Diagnosis not present

## 2016-06-25 DIAGNOSIS — E1129 Type 2 diabetes mellitus with other diabetic kidney complication: Secondary | ICD-10-CM | POA: Diagnosis not present

## 2016-06-25 DIAGNOSIS — N179 Acute kidney failure, unspecified: Secondary | ICD-10-CM | POA: Diagnosis not present

## 2016-06-25 DIAGNOSIS — D631 Anemia in chronic kidney disease: Secondary | ICD-10-CM | POA: Diagnosis not present

## 2016-06-25 LAB — COMPREHENSIVE METABOLIC PANEL
ALK PHOS: 81 U/L (ref 38–126)
ALT: 8 U/L — AB (ref 14–54)
ANION GAP: 7 (ref 5–15)
AST: 16 U/L (ref 15–41)
Albumin: 2.6 g/dL — ABNORMAL LOW (ref 3.5–5.0)
BUN: 38 mg/dL — ABNORMAL HIGH (ref 6–20)
CALCIUM: 8.1 mg/dL — AB (ref 8.9–10.3)
CO2: 16 mmol/L — AB (ref 22–32)
CREATININE: 2.69 mg/dL — AB (ref 0.44–1.00)
Chloride: 120 mmol/L — ABNORMAL HIGH (ref 101–111)
GFR, EST AFRICAN AMERICAN: 21 mL/min — AB (ref >60)
GFR, EST NON AFRICAN AMERICAN: 18 mL/min — AB (ref >60)
Glucose, Bld: 93 mg/dL (ref 65–99)
Potassium: 2.6 mmol/L — CL (ref 3.5–5.1)
SODIUM: 143 mmol/L (ref 135–145)
Total Bilirubin: 0.4 mg/dL (ref 0.3–1.2)
Total Protein: 4.9 g/dL — ABNORMAL LOW (ref 6.5–8.1)

## 2016-06-25 LAB — CBC
HCT: 31.6 % — ABNORMAL LOW (ref 36.0–46.0)
HEMOGLOBIN: 10.3 g/dL — AB (ref 12.0–15.0)
MCH: 27.8 pg (ref 26.0–34.0)
MCHC: 32.6 g/dL (ref 30.0–36.0)
MCV: 85.2 fL (ref 78.0–100.0)
PLATELETS: 206 10*3/uL (ref 150–400)
RBC: 3.71 MIL/uL — ABNORMAL LOW (ref 3.87–5.11)
RDW: 15.9 % — ABNORMAL HIGH (ref 11.5–15.5)
WBC: 5.4 10*3/uL (ref 4.0–10.5)

## 2016-06-25 LAB — GLUCOSE, CAPILLARY
GLUCOSE-CAPILLARY: 116 mg/dL — AB (ref 65–99)
GLUCOSE-CAPILLARY: 120 mg/dL — AB (ref 65–99)
Glucose-Capillary: 148 mg/dL — ABNORMAL HIGH (ref 65–99)
Glucose-Capillary: 256 mg/dL — ABNORMAL HIGH (ref 65–99)

## 2016-06-25 LAB — HEMOGLOBIN A1C
Hgb A1c MFr Bld: 6.8 % — ABNORMAL HIGH (ref 4.8–5.6)
Mean Plasma Glucose: 148 mg/dL

## 2016-06-25 LAB — MAGNESIUM: MAGNESIUM: 1.1 mg/dL — AB (ref 1.7–2.4)

## 2016-06-25 LAB — PHOSPHORUS: Phosphorus: 3.2 mg/dL (ref 2.5–4.6)

## 2016-06-25 MED ORDER — POTASSIUM CHLORIDE CRYS ER 20 MEQ PO TBCR
40.0000 meq | EXTENDED_RELEASE_TABLET | Freq: Two times a day (BID) | ORAL | Status: DC
  Administered 2016-06-25: 40 meq via ORAL
  Filled 2016-06-25: qty 2

## 2016-06-25 MED ORDER — INSULIN GLARGINE 100 UNIT/ML ~~LOC~~ SOLN: 15.0000 [IU] | Freq: Every day | SUBCUTANEOUS | 0 refills | Status: DC

## 2016-06-25 MED ORDER — MAGNESIUM SULFATE 2 GM/50ML IV SOLN
2.0000 g | Freq: Once | INTRAVENOUS | Status: AC
  Administered 2016-06-25: 2 g via INTRAVENOUS
  Filled 2016-06-25 (×2): qty 50

## 2016-06-25 MED ORDER — LABETALOL HCL 200 MG PO TABS: 200.0000 mg | ORAL_TABLET | Freq: Two times a day (BID) | ORAL | 0 refills | Status: AC

## 2016-06-25 NOTE — Progress Notes (Addendum)
Nutrition Brief Note  Plans for pt to discharge home today after magnesium supplementation. Pt reports appetite is fine currently and PTA with usual consumption of at least 3 meals a day. Meals usually contain a protein, vegetable, and fruit. Current meal completion has been 100%. Pt currently has Ensure ordered and would like to continue with them. No further nutrition interventions at this time.   Corrin Parker, MS, RD, LDN Pager # [phone] After hours/ weekend pager # [phone]

## 2016-06-25 NOTE — Progress Notes (Signed)
Kimberly [name] to be D/C'd Home per MD order.  Discussed prescriptions and follow up appointments with the patient. Prescriptions given to patient, medication list explained in detail. Pt verbalized understanding.  Allergies as of 06/25/2016      Reactions   No Known Allergies       Medication List    STOP taking these medications   allopurinol 100 MG tablet Commonly known as:  ZYLOPRIM   hydrochlorothiazide 25 MG tablet Commonly known as:  HYDRODIURIL   Na Sulfate-K Sulfate-Mg Sulf 17.5-3.13-1.6 GM/180ML Soln     TAKE these medications   ACCU-CHEK SMARTVIEW test strip Generic drug:  glucose blood 2 (two) times daily. Use twice daily as directed   amLODipine 10 MG tablet Commonly known as:  NORVASC Take 10 mg by mouth daily.   B-D ULTRAFINE III SHORT PEN 31G X 8 MM Misc Generic drug:  Insulin Pen Needle Use twice daily as directed   ezetimibe-simvastatin 10-20 MG tablet Commonly known as:  VYTORIN Take 1 tablet by mouth at bedtime.   imiquimod 5 % cream Commonly known as:  ALDARA Apply 1 application topically 3 (three) times a week.   insulin glargine 100 UNIT/ML injection Commonly known as:  LANTUS Inject 0.15 mLs (15 Units total) into the skin at bedtime. What changed:  how much to take   labetalol 200 MG tablet Commonly known as:  NORMODYNE Take 1 tablet (200 mg total) by mouth 2 (two) times daily. What changed:  how much to take   mycophenolate 250 MG capsule Commonly known as:  CELLCEPT Take 500 mg by mouth 2 (two) times daily.   omeprazole 40 MG capsule Commonly known as:  PRILOSEC Take 40 mg by mouth daily.   potassium chloride 10 MEQ tablet Commonly known as:  K-DUR,KLOR-CON Take 10 mEq by mouth daily.   predniSONE 5 MG tablet Commonly known as:  DELTASONE Take 5 mg by mouth daily.   PRESCRIPTION MEDICATION Med Name: ERYTHROPOIETIN INJECTION For: Low oxygen due to low red blood cell count Take one (1) injection into the muscle every two (2)  weeks.   tacrolimus 1 MG capsule Commonly known as:  PROGRAF Take 4 mg by mouth every morning. Take 3 mg by mouth each evening.       Vitals:   06/25/16 0526 06/25/16 0945  BP: (!) 159/56 (!) 154/59  Pulse: 82 85  Resp: 16 16  Temp: 99.9 F (37.7 C) 98.3 F (36.8 C)    Skin clean, dry and intact without evidence of skin break down, no evidence of skin tears noted. IV catheter discontinued intact. Site without signs and symptoms of complications. Dressing and pressure applied. Pt denies pain at this time. No complaints noted.  An After Visit Summary was printed and given to the patient. Patient escorted via [place], and D/C home via private auto.  Kimberly [name] BSN, RN [facility] Phone [phone]

## 2016-06-25 NOTE — Discharge Instructions (Signed)
Recommendations for Outpatient Follow-up:  1. Follow up with PCP next week 2. Please obtain BMP, Mg next week

## 2016-06-25 NOTE — Progress Notes (Signed)
I stopped and spoke to Kimberly [name]- a patient that I know well.  She is back to baseline- this all seems to be related to hypoglycemia which has now resolved.  Creatinine is close to baseline   I told her to stop her oral hypoglycemic, decrease her lantus dosing from 26 units to 15 units qHS and check sugar.  Also have ordered K dur for her to take prior to discharge but she has some at home. She will reschedule her procrit shot  We will get labs next week as OP thru Dr. Ninetta Lights A

## 2016-06-25 NOTE — Discharge Summary (Signed)
Physician Discharge Summary  Kimberly [name] KGY:[id number] DOB: [birth year] DOA: 06/24/2016  PCP: Marton Redwood, MD  Admit date: 06/24/2016 Discharge date: 06/25/2016  Admitted From: home Disposition:  home  Recommendations for Outpatient Follow-up:  1. Follow up with PCP next week 2. Please obtain BMP, Mg next week   Home Health: no  Equipment/Devices: none   Discharge Condition: stable CODE STATUS: full  Diet recommendation: heart healthy   Brief/Interim Summary: Kimberly [name] is a 61 y.o. female with medical history significant for gout, dyslipidemia, chronic kidney disease status post renal transplant 2010, diabetes on insulin, hypertension and anemia of chronic kidney disease requiring erythropoietin and iron infusions who presents with reports of generalized weakness. For about 3 weeks, the patient has had poor oral intake and has had intermittent symptoms of upper respiratory infection with productive cough of greenish yellow sputum. Respiratory symptoms have improved and patient denies current shortness of breath. Today, when attempting to get out of bed patient was so weak she was unable to stand and seemed to be somewhat confused and slow to respond to questions asked. EMS was called to the home and patient remained weak. In the ER, patient's CBGs were low between 30 and 49. Patient's generalized weakness resolved with improvement in blood sugars. Patient's Glucotrol was discontinued and Lantus dose decreased on discharge.  Subjective on day of discharge: Feeling back to baseline. She has been ambulating around the room without issues. She denies any complaints, denies any chest pain, shortness of breath, nausea, vomiting, diarrhea. Eager to go home.  Discharge Diagnoses:  Principal Problem:   Generalized weakness Active Problems:   Hyperparathyroidism due to renal insufficiency (HCC)   HLD (hyperlipidemia)   Essential (primary) hypertension   History of kidney transplant   Iron  deficiency anemia   Anemia in chronic renal disease   Diabetes mellitus type 2, insulin dependent (HCC)   Hypoglycemia   CKD (chronic kidney disease) stage 4, GFR 15-29 ml/min (HCC)   Generalized weakness -Patient presents with progressive poor oral intake 3 weeks culminating in generalized weakness and gait disturbance. Likely multifactorial in etiology related to hypoglycemia and progressive renal dysfunction -Improved, ambulating around the room without issues   Hypoglycemia in insulin dependent diabetes mellitus type 2  -Patient hypoglycemic with CBG 49 at presentation further decreased to 30 when rechecked -Suspect hypoglycemia primary etiology to generalized weakness symptoms -Discontinue preadmission Glucotrol and setting of severe stage for almost stage V chronic kidney disease. Decrease lantus to 15u (from 26u) and monitor closely to avoid hypoglycemia. Titrate as outpatient.  -Hemoglobin A1c 6.8  Hypokalemia and hypomagnesemia -Appears to be a chronic issue for patient -Replaced during hospitalization  -Patient to follow up outpatient for repeat lab and close monitoring   Essential hypertension -Continue preadmission Norvasc, labetalol -Hold HCTZ due to hypokalemia   HLD -Continue vytorin   History of kidney transplant/CKD stage 4 -Renal transplant in 2010 on prednisone, CellCept, Prograf -Nephrology consulted  Iron deficiency anemia/Anemia in chronic renal disease -Patient very symptomatic in August 2017 prompting initiation of erythropoietin and periodic iron infusions -Stable     Discharge Instructions  Discharge Instructions    Diet - low sodium heart healthy    Complete by:  As directed    Increase activity slowly    Complete by:  As directed      Allergies as of 06/25/2016      Reactions   No Known Allergies       Medication List    STOP  taking these medications   allopurinol 100 MG tablet Commonly known as:  ZYLOPRIM   hydrochlorothiazide 25  MG tablet Commonly known as:  HYDRODIURIL   Na Sulfate-K Sulfate-Mg Sulf 17.5-3.13-1.6 GM/180ML Soln     TAKE these medications   ACCU-CHEK SMARTVIEW test strip Generic drug:  glucose blood 2 (two) times daily. Use twice daily as directed   amLODipine 10 MG tablet Commonly known as:  NORVASC Take 10 mg by mouth daily.   B-D ULTRAFINE III SHORT PEN 31G X 8 MM Misc Generic drug:  Insulin Pen Needle Use twice daily as directed   ezetimibe-simvastatin 10-20 MG tablet Commonly known as:  VYTORIN Take 1 tablet by mouth at bedtime.   imiquimod 5 % cream Commonly known as:  ALDARA Apply 1 application topically 3 (three) times a week.   insulin glargine 100 UNIT/ML injection Commonly known as:  LANTUS Inject 0.15 mLs (15 Units total) into the skin at bedtime. What changed:  how much to take   labetalol 200 MG tablet Commonly known as:  NORMODYNE Take 1 tablet (200 mg total) by mouth 2 (two) times daily. What changed:  how much to take   mycophenolate 250 MG capsule Commonly known as:  CELLCEPT Take 500 mg by mouth 2 (two) times daily.   omeprazole 40 MG capsule Commonly known as:  PRILOSEC Take 40 mg by mouth daily.   potassium chloride 10 MEQ tablet Commonly known as:  K-DUR,KLOR-CON Take 10 mEq by mouth daily.   predniSONE 5 MG tablet Commonly known as:  DELTASONE Take 5 mg by mouth daily.   PRESCRIPTION MEDICATION Med Name: ERYTHROPOIETIN INJECTION For: Low oxygen due to low red blood cell count Take one (1) injection into the muscle every two (2) weeks.   tacrolimus 1 MG capsule Commonly known as:  PROGRAF Take 4 mg by mouth every morning. Take 3 mg by mouth each evening.      Follow-up Information    Marton Redwood, MD. Schedule an appointment as soon as possible for a visit in 1 week(s).   Specialty:  Internal Medicine Contact information: [place] [address] [phone]          Allergies  Allergen Reactions  . No Known  Allergies     Consultations:  Nephrology    Procedures/Studies: Dg Chest 2 View  Result Date: 06/24/2016 CLINICAL DATA:  Status post fall when arising from bedthis morning. Patient ports an episode of vomiting but no other symptoms now. EXAM: CHEST  2 VIEW COMPARISON:  Chest x-ray of August 04, 2005 FINDINGS: The lungs are well-expanded. There is no focal infiltrate. There is mild hemidiaphragm flattening on the lateral view. The heart and pulmonary vascularity are normal. There is calcification in the wall of the aortic arch. There is no pleural effusion. There is multilevel degenerative disc disease of the thoracic spine. IMPRESSION: No acute cardiopulmonary abnormality. Probable chronic bronchitic changes. Electronically Signed   By: David  Martinique M.D.   On: 06/24/2016 10:28   Ct Head Wo Contrast  Result Date: 06/24/2016 CLINICAL DATA:  Fall this morning. EXAM: CT HEAD WITHOUT CONTRAST TECHNIQUE: Contiguous axial images were obtained from the base of the skull through the vertex without intravenous contrast. COMPARISON:  None. FINDINGS: Brain: No acute intracranial abnormality. Specifically, no hemorrhage, hydrocephalus, mass lesion, acute infarction, or significant intracranial injury. Vascular: No hyperdense vessel or unexpected calcification. Skull: No acute calvarial abnormality. Sinuses/Orbits: Mucosal thickening throughout the paranasal sinuses. Mastoid air cells are clear. Orbital soft  tissues unremarkable. Other:  None IMPRESSION: No intracranial abnormality. Chronic sinusitis. Electronically Signed   By: Rolm Baptise M.D.   On: 06/24/2016 11:22    Discharge Exam: Vitals:   06/25/16 0526 06/25/16 0945  BP: (!) 159/56 (!) 154/59  Pulse: 82 85  Resp: 16 16  Temp: 99.9 F (37.7 C) 98.3 F (36.8 C)   Vitals:   06/24/16 2124 06/25/16 0300 06/25/16 0526 06/25/16 0945  BP: (!) 167/61  (!) 159/56 (!) 154/59  Pulse: 79  82 85  Resp: 18  16 16   Temp: 98.6 F (37 C)  99.9 F (37.7  C) 98.3 F (36.8 C)  TempSrc: Oral  Oral   SpO2: 99%  97%   Weight:   64.8 kg (142 lb 14.4 oz)   Height:  5\' 6"  (1.676 m)      General: Pt is alert, awake, not in acute distress Cardiovascular: RRR, S1/S2 +, no rubs, no gallops Respiratory: CTA bilaterally, no wheezing, no rhonchi Abdominal: Soft, NT, ND, bowel sounds + Extremities: no edema, no cyanosis    The results of significant diagnostics from this hospitalization (including imaging, microbiology, ancillary and laboratory) are listed below for reference.     Microbiology: No results found for this or any previous visit (from the past 240 hour(s)).   Labs: BNP (last 3 results)  Recent Labs  06/24/16 0956  BNP 81.1   Basic Metabolic Panel:  Recent Labs Lab 06/24/16 0956 06/24/16 1944 06/25/16 0549  NA 142  --  143  K 3.2*  --  2.6*  CL 124*  --  120*  CO2 11*  --  16*  GLUCOSE 49*  --  93  BUN 48*  --  38*  CREATININE 3.15*  --  2.69*  CALCIUM 9.0  --  8.1*  MG  --  1.3* 1.1*  PHOS  --  3.0 3.2   Liver Function Tests:  Recent Labs Lab 06/24/16 0956 06/25/16 0549  AST 20 16  ALT 10* 8*  ALKPHOS 93 81  BILITOT 0.4 0.4  PROT 6.0* 4.9*  ALBUMIN 3.2* 2.6*   No results for input(s): LIPASE, AMYLASE in the last 168 hours. No results for input(s): AMMONIA in the last 168 hours. CBC:  Recent Labs Lab 06/24/16 0956 06/25/16 0549  WBC 7.0 5.4  NEUTROABS 6.1  --   HGB 11.4* 10.3*  HCT 35.0* 31.6*  MCV 85.6 85.2  PLT 209 206   Cardiac Enzymes: No results for input(s): CKTOTAL, CKMB, CKMBINDEX, TROPONINI in the last 168 hours. BNP: Invalid input(s): POCBNP CBG:  Recent Labs Lab 06/24/16 1852 06/24/16 2016 06/25/16 0025 06/25/16 0843 06/25/16 1147  GLUCAP 181* 143* 120* 116* 148*   D-Dimer No results for input(s): DDIMER in the last 72 hours. Hgb A1c  Recent Labs  06/24/16 1339  HGBA1C 6.8*   Lipid Profile No results for input(s): CHOL, HDL, LDLCALC, TRIG, CHOLHDL, LDLDIRECT  in the last 72 hours. Thyroid function studies No results for input(s): TSH, T4TOTAL, T3FREE, THYROIDAB in the last 72 hours.  Invalid input(s): FREET3 Anemia work up No results for input(s): VITAMINB12, FOLATE, FERRITIN, TIBC, IRON, RETICCTPCT in the last 72 hours. Urinalysis    Component Value Date/Time   COLORURINE YELLOW 06/24/2016 1154   APPEARANCEUR HAZY (A) 06/24/2016 1154   LABSPEC 1.011 06/24/2016 1154   PHURINE 5.0 06/24/2016 1154   GLUCOSEU NEGATIVE 06/24/2016 1154   HGBUR NEGATIVE 06/24/2016 [place] 06/24/2016 1154   [place] 06/24/2016 1154  PROTEINUR 100 (A) 06/24/2016 1154   NITRITE NEGATIVE 06/24/2016 1154   LEUKOCYTESUR NEGATIVE 06/24/2016 1154   Sepsis Labs Invalid input(s): PROCALCITONIN,  WBC,  LACTICIDVEN Microbiology No results found for this or any previous visit (from the past 240 hour(s)).   Time coordinating discharge: Over 30 minutes  SIGNED:  Dessa Phi, DO [facility] Pager [phone]  If 7PM-7AM, please contact night-coverage [email or web address] Password [email or web address] 06/25/2016, 2:45 PM

## 2016-06-25 NOTE — Care Management Obs Status (Signed)
[place] NOTIFICATION   Patient Details  Name: Kimberly [name] MRN: [id number] Date of Birth: 09/13/1955   Medicare Observation Status Notification Given:  Yes    Quinterious Walraven, Rory Percy, RN 06/25/2016, 11:44 AM

## 2016-06-26 NOTE — Progress Notes (Signed)
Patient called requesting Lantus pen instead of vial.  Dr. Maylene Roes notified.  MD to call in RX for Latus pen.  Jillyn Ledger, MBA, BSN, RN

## 2016-06-27 ENCOUNTER — Encounter (HOSPITAL_COMMUNITY)
Admission: RE | Admit: 2016-06-27 | Discharge: 2016-06-27 | Disposition: A | Discharge status: Home / Self Care | Payer: Medicare Other | Source: Ambulatory Visit | Attending: Nephrology | Admitting: Nephrology

## 2016-06-27 DIAGNOSIS — N184 Chronic kidney disease, stage 4 (severe): Secondary | ICD-10-CM | POA: Diagnosis not present

## 2016-06-27 DIAGNOSIS — D631 Anemia in chronic kidney disease: Secondary | ICD-10-CM

## 2016-06-27 DIAGNOSIS — D638 Anemia in other chronic diseases classified elsewhere: Secondary | ICD-10-CM | POA: Insufficient documentation

## 2016-06-27 LAB — POCT HEMOGLOBIN-HEMACUE: HEMOGLOBIN: 10.6 g/dL — AB (ref 12.0–15.0)

## 2016-06-27 LAB — TACROLIMUS LEVEL: Tacrolimus (FK506) - LabCorp: 2.7 ng/mL (ref 2.0–20.0)

## 2016-06-27 MED ORDER — EPOETIN ALFA 20000 UNIT/ML IJ SOLN
INTRAMUSCULAR | Status: AC
  Administered 2016-06-27: 20000 [IU] via SUBCUTANEOUS
  Filled 2016-06-27: qty 1

## 2016-06-27 MED ORDER — EPOETIN ALFA 20000 UNIT/ML IJ SOLN
20000.0000 [IU] | INTRAMUSCULAR | Status: DC
  Administered 2016-06-27: 20000 [IU] via SUBCUTANEOUS

## 2016-06-27 NOTE — Patient Instructions (Addendum)
Kimberly [name]  06/27/2016   Your procedure is scheduled on: 07-03-16  Report to [facility] Main  Entrance take [facility]  elevators to 3rd floor to  [place] at 530 AM.  Call this number if you have problems the morning of surgery [phone]   Remember: ONLY 1 PERSON MAY GO WITH YOU TO SHORT STAY TO GET  READY MORNING OF [place].  Do not eat food or drink liquids :After Midnight. FLEETS ENEMA NIGHT BEFORE SURGERY 07-02-16     Take these medicines the morning of surgery with A SIP OF WATER: TACRIMOLUS (PROGRAF), PREDNOSONE, OMEPRAZOLE (PRILOSEC), AMLODIPINE (NORVASC), LABETALOL, MYCOPHENOLATE (CELLCEPT),  DO NOT TAKE ANY DIABETIC MEDICATIONS DAY OF YOUR SURGERY                               You may not have any metal on your body including hair pins and              piercings  Do not wear jewelry, make-up, lotions, powders or perfumes, deodorant             Do not wear nail polish.  Do not shave  48 hours prior to surgery.              Men may shave face and neck.   Do not bring valuables to the hospital. [place].  Contacts, dentures or bridgework may not be worn into surgery.  Leave suitcase in the car. After surgery it may be brought to your room.                  Please read over the following fact sheets you were given: _____________________________________________________________________             How to Manage Your Diabetes Before and After Surgery  Why is it important to control my blood sugar before and after surgery? . Improving blood sugar levels before and after surgery helps healing and can limit problems. . A way of improving blood sugar control is eating a healthy diet by: o  Eating less sugar and carbohydrates o  Increasing activity/exercise o  Talking with your doctor about reaching your blood sugar goals . High blood sugars (greater than 180 mg/dL) can raise your risk of  infections and slow your recovery, so you will need to focus on controlling your diabetes during the weeks before surgery. . Make sure that the doctor who takes care of your diabetes knows about your planned surgery including the date and location.  How do I manage my blood sugar before surgery? . Check your blood sugar at least 4 times a day, starting 2 days before surgery, to make sure that the level is not too high or low. o Check your blood sugar the morning of your surgery when you wake up and every 2 hours until you get to the Short Stay unit. . If your blood sugar is less than 70 mg/dL, you will need to treat for low blood sugar: o Do not take insulin. o Treat a low blood sugar (less than 70 mg/dL) with  cup of clear juice (cranberry or apple), 4 glucose tablets, OR glucose gel. o Recheck blood sugar in 15 minutes after  treatment (to make sure it is greater than 70 mg/dL). If your blood sugar is not greater than 70 mg/dL on recheck, call [phone] for further instructions. . Report your blood sugar to the short stay nurse when you get to Short Stay.  . If you are admitted to the hospital after surgery: o Your blood sugar will be checked by the staff and you will probably be given insulin after surgery (instead of oral diabetes medicines) to make sure you have good blood sugar levels. o The goal for blood sugar control after surgery is 80-180 mg/dL.   WHAT DO I DO ABOUT MY DIABETES MEDICATION?  Marland Kitchen Do not take oral diabetes medicines (pills) the morning of surgery.  . THE NIGHT BEFORE SURGERY, take 7 AND 1/2  units of LANTUS  insulin.       .    Patient Signature:  Date:   Nurse Signature:  Date:   Reviewed and Endorsed by [facility] Patient Education Committee, August 2015[facility] - Preparing for Surgery Before surgery, you can play an important role.  Because skin is not sterile, your skin needs to be as free of germs as possible.  You can reduce the number of germs on  your skin by washing with CHG (chlorahexidine gluconate) soap before surgery.  CHG is an antiseptic cleaner which kills germs and bonds with the skin to continue killing germs even after washing. Please DO NOT use if you have an allergy to CHG or antibacterial soaps.  If your skin becomes reddened/irritated stop using the CHG and inform your nurse when you arrive at Short Stay. Do not shave (including legs and underarms) for at least 48 hours prior to the first CHG shower.  You may shave your face/neck. Please follow these instructions carefully:  1.  Shower with CHG Soap the night before surgery and the  morning of Surgery.  2.  If you choose to wash your hair, wash your hair first as usual with your  normal  shampoo.  3.  After you shampoo, rinse your hair and body thoroughly to remove the  shampoo.                           4.  Use CHG as you would any other liquid soap.  You can apply chg directly  to the skin and wash                       Gently with a scrungie or clean washcloth.  5.  Apply the CHG Soap to your body ONLY FROM THE NECK DOWN.   Do not use on face/ open                           Wound or open sores. Avoid contact with eyes, ears mouth and genitals (private parts).                       Wash face,  Genitals (private parts) with your normal soap.             6.  Wash thoroughly, paying special attention to the area where your surgery  will be performed.  7.  Thoroughly rinse your body with warm water from the neck down.  8.  DO NOT shower/wash with your normal soap after using and rinsing off  the CHG Soap.  9.  Pat yourself dry with a clean towel.            10.  Wear clean pajamas.            11.  Place clean sheets on your bed the night of your first shower and do not  sleep with pets. Day of Surgery : Do not apply any lotions/deodorants the morning of surgery.  Please wear clean clothes to the hospital/surgery center.

## 2016-06-27 NOTE — Progress Notes (Signed)
CHEST XRAY 06-24-16 EPIC ECHO 01-25-16 EPIC STRESS TEST 02-14-16 EPIC

## 2016-07-01 ENCOUNTER — Encounter (HOSPITAL_COMMUNITY)
Admission: RE | Admit: 2016-07-01 | Discharge: 2016-07-01 | Disposition: A | Discharge status: Home / Self Care | Payer: Medicare Other | Source: Ambulatory Visit | Attending: General Surgery | Admitting: General Surgery

## 2016-07-01 ENCOUNTER — Encounter (HOSPITAL_COMMUNITY): Payer: Self-pay

## 2016-07-01 DIAGNOSIS — I120 Hypertensive chronic kidney disease with stage 5 chronic kidney disease or end stage renal disease: Secondary | ICD-10-CM | POA: Diagnosis not present

## 2016-07-01 DIAGNOSIS — K648 Other hemorrhoids: Secondary | ICD-10-CM | POA: Diagnosis not present

## 2016-07-01 DIAGNOSIS — E785 Hyperlipidemia, unspecified: Secondary | ICD-10-CM | POA: Diagnosis not present

## 2016-07-01 DIAGNOSIS — M8580 Other specified disorders of bone density and structure, unspecified site: Secondary | ICD-10-CM | POA: Diagnosis not present

## 2016-07-01 DIAGNOSIS — Z940 Kidney transplant status: Secondary | ICD-10-CM | POA: Diagnosis not present

## 2016-07-01 DIAGNOSIS — A630 Anogenital (venereal) warts: Secondary | ICD-10-CM | POA: Diagnosis not present

## 2016-07-01 DIAGNOSIS — E538 Deficiency of other specified B group vitamins: Secondary | ICD-10-CM | POA: Diagnosis not present

## 2016-07-01 DIAGNOSIS — K644 Residual hemorrhoidal skin tags: Secondary | ICD-10-CM | POA: Diagnosis not present

## 2016-07-01 DIAGNOSIS — E1122 Type 2 diabetes mellitus with diabetic chronic kidney disease: Secondary | ICD-10-CM | POA: Diagnosis not present

## 2016-07-01 DIAGNOSIS — D631 Anemia in chronic kidney disease: Secondary | ICD-10-CM | POA: Diagnosis not present

## 2016-07-01 DIAGNOSIS — M109 Gout, unspecified: Secondary | ICD-10-CM | POA: Diagnosis not present

## 2016-07-01 DIAGNOSIS — Z86010 Personal history of colonic polyps: Secondary | ICD-10-CM | POA: Diagnosis not present

## 2016-07-01 DIAGNOSIS — Z794 Long term (current) use of insulin: Secondary | ICD-10-CM | POA: Diagnosis not present

## 2016-07-01 DIAGNOSIS — N186 End stage renal disease: Secondary | ICD-10-CM | POA: Diagnosis not present

## 2016-07-01 DIAGNOSIS — Z87891 Personal history of nicotine dependence: Secondary | ICD-10-CM | POA: Diagnosis not present

## 2016-07-01 DIAGNOSIS — E0590 Thyrotoxicosis, unspecified without thyrotoxic crisis or storm: Secondary | ICD-10-CM | POA: Diagnosis not present

## 2016-07-01 LAB — CBC WITH DIFFERENTIAL/PLATELET
BASOS PCT: 0 %
Basophils Absolute: 0 10*3/uL (ref 0.0–0.1)
Eosinophils Absolute: 0.1 10*3/uL (ref 0.0–0.7)
Eosinophils Relative: 2 %
HEMATOCRIT: 33.7 % — AB (ref 36.0–46.0)
Hemoglobin: 10.9 g/dL — ABNORMAL LOW (ref 12.0–15.0)
LYMPHS PCT: 5 %
Lymphs Abs: 0.2 10*3/uL — ABNORMAL LOW (ref 0.7–4.0)
MCH: 28.2 pg (ref 26.0–34.0)
MCHC: 32.3 g/dL (ref 30.0–36.0)
MCV: 87.3 fL (ref 78.0–100.0)
MONOS PCT: 5 %
Monocytes Absolute: 0.2 10*3/uL (ref 0.1–1.0)
NEUTROS ABS: 4.2 10*3/uL (ref 1.7–7.7)
Neutrophils Relative %: 88 %
Platelets: 231 10*3/uL (ref 150–400)
RBC: 3.86 MIL/uL — ABNORMAL LOW (ref 3.87–5.11)
RDW: 16.2 % — ABNORMAL HIGH (ref 11.5–15.5)
WBC: 4.8 10*3/uL (ref 4.0–10.5)

## 2016-07-01 LAB — BASIC METABOLIC PANEL
ANION GAP: 7 (ref 5–15)
BUN: 32 mg/dL — ABNORMAL HIGH (ref 6–20)
CALCIUM: 8.6 mg/dL — AB (ref 8.9–10.3)
CHLORIDE: 117 mmol/L — AB (ref 101–111)
CO2: 17 mmol/L — AB (ref 22–32)
Creatinine, Ser: 2.43 mg/dL — ABNORMAL HIGH (ref 0.44–1.00)
GFR calc non Af Amer: 20 mL/min — ABNORMAL LOW (ref >60)
GFR, EST AFRICAN AMERICAN: 24 mL/min — AB (ref >60)
Glucose, Bld: 181 mg/dL — ABNORMAL HIGH (ref 65–99)
POTASSIUM: 3.4 mmol/L — AB (ref 3.5–5.1)
Sodium: 141 mmol/L (ref 135–145)

## 2016-07-01 LAB — GLUCOSE, CAPILLARY: Glucose-Capillary: 183 mg/dL — ABNORMAL HIGH (ref 65–99)

## 2016-07-01 NOTE — Progress Notes (Signed)
hemaglobin A1C 06-24-16 epic ekg abnormal 06-24-16 will repeat with pre op labs today

## 2016-07-01 NOTE — Progress Notes (Signed)
Spoke with dr Linna Caprice about 07-01-17 ekg And 06-24-16 ekg And 02-18-16 ekg pt ok for surgery per dr Linna Caprice

## 2016-07-01 NOTE — Progress Notes (Signed)
SPOKE WITH DR Linna Caprice ABOUT EKG RESULT FROM 1-91-18 AND 06-24-16 HE WILL COME UP AND LOOK AT EKG LATER AND CALL PATIENT IF THERE IS A PROBLEM.

## 2016-07-01 NOTE — Progress Notes (Signed)
Basic Metabolic Panel routed to Dr Greer Pickerel via [facility]

## 2016-07-02 DIAGNOSIS — Z940 Kidney transplant status: Secondary | ICD-10-CM | POA: Diagnosis not present

## 2016-07-02 DIAGNOSIS — E784 Other hyperlipidemia: Secondary | ICD-10-CM | POA: Diagnosis not present

## 2016-07-02 DIAGNOSIS — N184 Chronic kidney disease, stage 4 (severe): Secondary | ICD-10-CM | POA: Diagnosis not present

## 2016-07-02 DIAGNOSIS — R531 Weakness: Secondary | ICD-10-CM | POA: Diagnosis not present

## 2016-07-02 DIAGNOSIS — Z6823 Body mass index (BMI) 23.0-23.9, adult: Secondary | ICD-10-CM | POA: Diagnosis not present

## 2016-07-02 DIAGNOSIS — D638 Anemia in other chronic diseases classified elsewhere: Secondary | ICD-10-CM | POA: Diagnosis not present

## 2016-07-02 DIAGNOSIS — E11649 Type 2 diabetes mellitus with hypoglycemia without coma: Secondary | ICD-10-CM | POA: Diagnosis not present

## 2016-07-02 DIAGNOSIS — J3089 Other allergic rhinitis: Secondary | ICD-10-CM | POA: Diagnosis not present

## 2016-07-02 DIAGNOSIS — I10 Essential (primary) hypertension: Secondary | ICD-10-CM | POA: Diagnosis not present

## 2016-07-02 DIAGNOSIS — E876 Hypokalemia: Secondary | ICD-10-CM | POA: Diagnosis not present

## 2016-07-03 ENCOUNTER — Encounter (HOSPITAL_COMMUNITY)
Admission: RE | Disposition: A | Discharge status: Home / Self Care | Payer: Self-pay | Source: Ambulatory Visit | Attending: General Surgery

## 2016-07-03 ENCOUNTER — Encounter (HOSPITAL_COMMUNITY): Payer: Self-pay | Admitting: *Deleted

## 2016-07-03 ENCOUNTER — Ambulatory Visit (HOSPITAL_COMMUNITY): Payer: Medicare Other | Admitting: Anesthesiology

## 2016-07-03 ENCOUNTER — Observation Stay (HOSPITAL_COMMUNITY)
Admission: RE | Admit: 2016-07-03 | Discharge: 2016-07-04 | Disposition: A | Discharge status: Home / Self Care | Payer: Medicare Other | Source: Ambulatory Visit | Attending: General Surgery | Admitting: General Surgery

## 2016-07-03 DIAGNOSIS — E1122 Type 2 diabetes mellitus with diabetic chronic kidney disease: Secondary | ICD-10-CM | POA: Diagnosis not present

## 2016-07-03 DIAGNOSIS — Z794 Long term (current) use of insulin: Secondary | ICD-10-CM | POA: Insufficient documentation

## 2016-07-03 DIAGNOSIS — N186 End stage renal disease: Secondary | ICD-10-CM | POA: Insufficient documentation

## 2016-07-03 DIAGNOSIS — I120 Hypertensive chronic kidney disease with stage 5 chronic kidney disease or end stage renal disease: Secondary | ICD-10-CM | POA: Diagnosis not present

## 2016-07-03 DIAGNOSIS — E0590 Thyrotoxicosis, unspecified without thyrotoxic crisis or storm: Secondary | ICD-10-CM | POA: Diagnosis not present

## 2016-07-03 DIAGNOSIS — D631 Anemia in chronic kidney disease: Secondary | ICD-10-CM | POA: Diagnosis present

## 2016-07-03 DIAGNOSIS — I129 Hypertensive chronic kidney disease with stage 1 through stage 4 chronic kidney disease, or unspecified chronic kidney disease: Secondary | ICD-10-CM | POA: Diagnosis not present

## 2016-07-03 DIAGNOSIS — E785 Hyperlipidemia, unspecified: Secondary | ICD-10-CM | POA: Diagnosis present

## 2016-07-03 DIAGNOSIS — A630 Anogenital (venereal) warts: Secondary | ICD-10-CM | POA: Diagnosis present

## 2016-07-03 DIAGNOSIS — E139 Other specified diabetes mellitus without complications: Secondary | ICD-10-CM | POA: Diagnosis present

## 2016-07-03 DIAGNOSIS — D649 Anemia, unspecified: Secondary | ICD-10-CM | POA: Diagnosis present

## 2016-07-03 DIAGNOSIS — E119 Type 2 diabetes mellitus without complications: Secondary | ICD-10-CM

## 2016-07-03 DIAGNOSIS — M109 Gout, unspecified: Secondary | ICD-10-CM | POA: Insufficient documentation

## 2016-07-03 DIAGNOSIS — D899 Disorder involving the immune mechanism, unspecified: Secondary | ICD-10-CM

## 2016-07-03 DIAGNOSIS — E538 Deficiency of other specified B group vitamins: Secondary | ICD-10-CM | POA: Diagnosis not present

## 2016-07-03 DIAGNOSIS — M8580 Other specified disorders of bone density and structure, unspecified site: Secondary | ICD-10-CM | POA: Diagnosis not present

## 2016-07-03 DIAGNOSIS — K644 Residual hemorrhoidal skin tags: Secondary | ICD-10-CM | POA: Diagnosis not present

## 2016-07-03 DIAGNOSIS — N184 Chronic kidney disease, stage 4 (severe): Secondary | ICD-10-CM | POA: Diagnosis not present

## 2016-07-03 DIAGNOSIS — Z86010 Personal history of colonic polyps: Secondary | ICD-10-CM | POA: Insufficient documentation

## 2016-07-03 DIAGNOSIS — D849 Immunodeficiency, unspecified: Secondary | ICD-10-CM | POA: Diagnosis present

## 2016-07-03 DIAGNOSIS — Z940 Kidney transplant status: Secondary | ICD-10-CM

## 2016-07-03 DIAGNOSIS — N189 Chronic kidney disease, unspecified: Secondary | ICD-10-CM

## 2016-07-03 DIAGNOSIS — I10 Essential (primary) hypertension: Secondary | ICD-10-CM | POA: Diagnosis present

## 2016-07-03 DIAGNOSIS — Z419 Encounter for procedure for purposes other than remedying health state, unspecified: Secondary | ICD-10-CM

## 2016-07-03 DIAGNOSIS — K648 Other hemorrhoids: Secondary | ICD-10-CM | POA: Diagnosis not present

## 2016-07-03 DIAGNOSIS — Z87891 Personal history of nicotine dependence: Secondary | ICD-10-CM | POA: Insufficient documentation

## 2016-07-03 DIAGNOSIS — K649 Unspecified hemorrhoids: Secondary | ICD-10-CM | POA: Diagnosis not present

## 2016-07-03 HISTORY — PX: RECTAL EXAM UNDER ANESTHESIA: SHX6399

## 2016-07-03 HISTORY — PX: WART FULGURATION: SHX5245

## 2016-07-03 LAB — GLUCOSE, CAPILLARY
GLUCOSE-CAPILLARY: 99 mg/dL (ref 65–99)
GLUCOSE-CAPILLARY: 150 mg/dL — AB (ref 65–99)
Glucose-Capillary: 254 mg/dL — ABNORMAL HIGH (ref 65–99)
Glucose-Capillary: 221 mg/dL — ABNORMAL HIGH (ref 65–99)

## 2016-07-03 SURGERY — EXAM UNDER ANESTHESIA, RECTUM
Anesthesia: General

## 2016-07-03 MED ORDER — EPINEPHRINE PF 1 MG/ML IJ SOLN
INTRAMUSCULAR | Status: AC
  Filled 2016-07-03: qty 1

## 2016-07-03 MED ORDER — EPHEDRINE SULFATE 50 MG/ML IJ SOLN
INTRAMUSCULAR | Status: DC | PRN
  Administered 2016-07-03 (×4): 5 mg via INTRAVENOUS

## 2016-07-03 MED ORDER — MIDAZOLAM HCL 5 MG/5ML IJ SOLN
INTRAMUSCULAR | Status: DC | PRN
  Administered 2016-07-03: 2 mg via INTRAVENOUS

## 2016-07-03 MED ORDER — SODIUM CHLORIDE 0.9 % IJ SOLN
INTRAMUSCULAR | Status: AC
  Filled 2016-07-03: qty 10

## 2016-07-03 MED ORDER — BUPIVACAINE HCL (PF) 0.25 % IJ SOLN
INTRAMUSCULAR | Status: AC
  Filled 2016-07-03: qty 30

## 2016-07-03 MED ORDER — DIBUCAINE 1 % RE OINT
TOPICAL_OINTMENT | RECTAL | Status: AC
  Filled 2016-07-03: qty 28

## 2016-07-03 MED ORDER — DIPHENHYDRAMINE HCL 50 MG/ML IJ SOLN: 12.5000 mg | Freq: Four times a day (QID) | INTRAMUSCULAR | Status: DC | PRN

## 2016-07-03 MED ORDER — ACETAMINOPHEN 500 MG PO TABS
1000.0000 mg | ORAL_TABLET | Freq: Four times a day (QID) | ORAL | Status: DC
  Administered 2016-07-03 – 2016-07-04 (×2): 1000 mg via ORAL
  Filled 2016-07-03 (×3): qty 2

## 2016-07-03 MED ORDER — ONDANSETRON HCL 4 MG/2ML IJ SOLN
INTRAMUSCULAR | Status: DC | PRN
  Administered 2016-07-03: 4 mg via INTRAVENOUS

## 2016-07-03 MED ORDER — BUPIVACAINE LIPOSOME 1.3 % IJ SUSP
20.0000 mL | Freq: Once | INTRAMUSCULAR | Status: AC
  Administered 2016-07-03: 20 mL
  Filled 2016-07-03: qty 20

## 2016-07-03 MED ORDER — ACETAMINOPHEN 500 MG PO TABS
1000.0000 mg | ORAL_TABLET | ORAL | Status: AC
  Administered 2016-07-03: 1000 mg via ORAL
  Filled 2016-07-03: qty 2

## 2016-07-03 MED ORDER — MORPHINE SULFATE (PF) 2 MG/ML IV SOLN: 1.0000 mg | INTRAVENOUS | Status: DC | PRN

## 2016-07-03 MED ORDER — LIDOCAINE 2% (20 MG/ML) 5 ML SYRINGE
INTRAMUSCULAR | Status: AC
  Filled 2016-07-03: qty 5

## 2016-07-03 MED ORDER — IBUPROFEN 200 MG PO TABS: 600.0000 mg | ORAL_TABLET | Freq: Four times a day (QID) | ORAL | Status: DC | PRN

## 2016-07-03 MED ORDER — DIPHENHYDRAMINE HCL 12.5 MG/5ML PO ELIX: 12.5000 mg | ORAL_SOLUTION | Freq: Four times a day (QID) | ORAL | Status: DC | PRN

## 2016-07-03 MED ORDER — CISATRACURIUM BESYLATE 20 MG/10ML IV SOLN
INTRAVENOUS | Status: AC
  Filled 2016-07-03: qty 10

## 2016-07-03 MED ORDER — LACTATED RINGERS IV SOLN
INTRAVENOUS | Status: DC | PRN
  Administered 2016-07-03: 07:00:00 via INTRAVENOUS

## 2016-07-03 MED ORDER — ONDANSETRON HCL 4 MG/2ML IJ SOLN
INTRAMUSCULAR | Status: AC
  Filled 2016-07-03: qty 2

## 2016-07-03 MED ORDER — TACROLIMUS 1 MG PO CAPS
3.0000 mg | ORAL_CAPSULE | Freq: Every day | ORAL | Status: DC
  Administered 2016-07-03: 3 mg via ORAL
  Filled 2016-07-03 (×2): qty 3

## 2016-07-03 MED ORDER — CEFAZOLIN SODIUM-DEXTROSE 2-4 GM/100ML-% IV SOLN
2.0000 g | INTRAVENOUS | Status: AC
  Administered 2016-07-03: 2 g via INTRAVENOUS

## 2016-07-03 MED ORDER — MYCOPHENOLATE MOFETIL 250 MG PO CAPS
500.0000 mg | ORAL_CAPSULE | Freq: Two times a day (BID) | ORAL | Status: DC
  Administered 2016-07-03 – 2016-07-04 (×2): 500 mg via ORAL
  Filled 2016-07-03 (×2): qty 2

## 2016-07-03 MED ORDER — GABAPENTIN 300 MG PO CAPS
300.0000 mg | ORAL_CAPSULE | ORAL | Status: AC
  Administered 2016-07-03: 300 mg via ORAL
  Filled 2016-07-03: qty 1

## 2016-07-03 MED ORDER — LABETALOL HCL 200 MG PO TABS
200.0000 mg | ORAL_TABLET | Freq: Two times a day (BID) | ORAL | Status: DC
  Administered 2016-07-03 – 2016-07-04 (×2): 200 mg via ORAL
  Filled 2016-07-03 (×2): qty 1

## 2016-07-03 MED ORDER — FENTANYL CITRATE (PF) 100 MCG/2ML IJ SOLN
INTRAMUSCULAR | Status: AC
  Filled 2016-07-03: qty 2

## 2016-07-03 MED ORDER — LIDOCAINE-EPINEPHRINE (PF) 1 %-1:200000 IJ SOLN
INTRAMUSCULAR | Status: DC | PRN
  Administered 2016-07-03: 20 mL

## 2016-07-03 MED ORDER — OXYCODONE HCL 5 MG PO TABS: 5.0000 mg | ORAL_TABLET | ORAL | Status: DC | PRN

## 2016-07-03 MED ORDER — POTASSIUM CHLORIDE IN NACL 20-0.9 MEQ/L-% IV SOLN
INTRAVENOUS | Status: DC
  Administered 2016-07-03: 13:00:00 50 mL/h via INTRAVENOUS
  Filled 2016-07-03 (×2): qty 1000

## 2016-07-03 MED ORDER — AMLODIPINE BESYLATE 10 MG PO TABS
10.0000 mg | ORAL_TABLET | Freq: Every day | ORAL | Status: DC
  Administered 2016-07-04: 10 mg via ORAL
  Filled 2016-07-03: qty 1

## 2016-07-03 MED ORDER — PROPOFOL 10 MG/ML IV BOLUS
INTRAVENOUS | Status: DC | PRN
  Administered 2016-07-03: 120 mg via INTRAVENOUS

## 2016-07-03 MED ORDER — LIDOCAINE-EPINEPHRINE (PF) 1 %-1:200000 IJ SOLN
INTRAMUSCULAR | Status: AC
  Filled 2016-07-03: qty 30

## 2016-07-03 MED ORDER — EPHEDRINE 5 MG/ML INJ
INTRAVENOUS | Status: AC
  Filled 2016-07-03: qty 10

## 2016-07-03 MED ORDER — PREDNISONE 5 MG PO TABS
5.0000 mg | ORAL_TABLET | Freq: Every day | ORAL | Status: DC
Start: 2016-07-04 — End: 2016-07-04
  Administered 2016-07-04: 5 mg via ORAL
  Filled 2016-07-03: qty 1

## 2016-07-03 MED ORDER — ONDANSETRON HCL 4 MG/2ML IJ SOLN: 4.0000 mg | Freq: Four times a day (QID) | INTRAMUSCULAR | Status: DC | PRN

## 2016-07-03 MED ORDER — PROPOFOL 10 MG/ML IV BOLUS
INTRAVENOUS | Status: AC
  Filled 2016-07-03: qty 20

## 2016-07-03 MED ORDER — DEXAMETHASONE SODIUM PHOSPHATE 10 MG/ML IJ SOLN
INTRAMUSCULAR | Status: AC
  Filled 2016-07-03: qty 1

## 2016-07-03 MED ORDER — TACROLIMUS 1 MG PO CAPS
4.0000 mg | ORAL_CAPSULE | Freq: Every day | ORAL | Status: DC
  Administered 2016-07-04: 4 mg via ORAL
  Filled 2016-07-03: qty 4

## 2016-07-03 MED ORDER — LIDOCAINE HCL (CARDIAC) 20 MG/ML IV SOLN
INTRAVENOUS | Status: DC | PRN
  Administered 2016-07-03: 60 mg via INTRAVENOUS

## 2016-07-03 MED ORDER — ONDANSETRON 4 MG PO TBDP: 4.0000 mg | ORAL_TABLET | Freq: Four times a day (QID) | ORAL | Status: DC | PRN

## 2016-07-03 MED ORDER — OXYCODONE HCL 5 MG/5ML PO SOLN: 5.0000 mg | Freq: Once | ORAL | Status: DC | PRN

## 2016-07-03 MED ORDER — SUCCINYLCHOLINE CHLORIDE 20 MG/ML IJ SOLN
INTRAMUSCULAR | Status: DC | PRN
  Administered 2016-07-03: 100 mg via INTRAVENOUS

## 2016-07-03 MED ORDER — FENTANYL CITRATE (PF) 100 MCG/2ML IJ SOLN: 25.0000 ug | INTRAMUSCULAR | Status: DC | PRN

## 2016-07-03 MED ORDER — CHLORHEXIDINE GLUCONATE CLOTH 2 % EX PADS: 6.0000 | MEDICATED_PAD | Freq: Once | CUTANEOUS | Status: DC

## 2016-07-03 MED ORDER — DOCUSATE SODIUM 100 MG PO CAPS
100.0000 mg | ORAL_CAPSULE | Freq: Two times a day (BID) | ORAL | Status: DC
  Administered 2016-07-03 – 2016-07-04 (×2): 100 mg via ORAL
  Filled 2016-07-03 (×2): qty 1

## 2016-07-03 MED ORDER — INSULIN ASPART 100 UNIT/ML ~~LOC~~ SOLN
0.0000 [IU] | Freq: Three times a day (TID) | SUBCUTANEOUS | Status: DC
  Administered 2016-07-03: 17:00:00 5 [IU] via SUBCUTANEOUS
  Administered 2016-07-04: 08:00:00 2 [IU] via SUBCUTANEOUS

## 2016-07-03 MED ORDER — FENTANYL CITRATE (PF) 100 MCG/2ML IJ SOLN
INTRAMUSCULAR | Status: DC | PRN
  Administered 2016-07-03: 50 ug via INTRAVENOUS
  Administered 2016-07-03: 25 ug via INTRAVENOUS

## 2016-07-03 MED ORDER — OXYCODONE HCL 5 MG PO TABS: 5.0000 mg | ORAL_TABLET | Freq: Once | ORAL | Status: DC | PRN

## 2016-07-03 MED ORDER — MIDAZOLAM HCL 2 MG/2ML IJ SOLN
INTRAMUSCULAR | Status: AC
  Filled 2016-07-03: qty 2

## 2016-07-03 MED ORDER — CEFAZOLIN SODIUM-DEXTROSE 2-4 GM/100ML-% IV SOLN
INTRAVENOUS | Status: AC
  Filled 2016-07-03: qty 100

## 2016-07-03 MED ORDER — HEPARIN SODIUM (PORCINE) 5000 UNIT/ML IJ SOLN
5000.0000 [IU] | Freq: Three times a day (TID) | INTRAMUSCULAR | Status: DC
  Administered 2016-07-04: 06:00:00 5000 [IU] via SUBCUTANEOUS
  Filled 2016-07-03: qty 1

## 2016-07-03 MED ORDER — INSULIN GLARGINE 100 UNIT/ML ~~LOC~~ SOLN
15.0000 [IU] | Freq: Every day | SUBCUTANEOUS | Status: DC
  Administered 2016-07-03: 21:00:00 15 [IU] via SUBCUTANEOUS
  Filled 2016-07-03: qty 0.15

## 2016-07-03 MED ORDER — SUCCINYLCHOLINE CHLORIDE 200 MG/10ML IV SOSY
PREFILLED_SYRINGE | INTRAVENOUS | Status: AC
  Filled 2016-07-03: qty 10

## 2016-07-03 SURGICAL SUPPLY — 38 items
BLADE SURG 15 STRL LF DISP TIS (BLADE) ×1 IMPLANT
BLADE SURG 15 STRL SS (BLADE) ×2
BRIEF STRETCH FOR OB PAD LRG (UNDERPADS AND DIAPERS) ×3 IMPLANT
COVER SURGICAL LIGHT HANDLE (MISCELLANEOUS) ×3 IMPLANT
DECANTER SPIKE VIAL GLASS SM (MISCELLANEOUS) ×3 IMPLANT
DRAPE LAPAROTOMY T 102X78X121 (DRAPES) ×3 IMPLANT
DRAPE LAPAROTOMY T 98X78 PEDS (DRAPES) IMPLANT
DRAPE SHEET LG 3/4 BI-LAMINATE (DRAPES) IMPLANT
DRAPE UTILITY XL STRL (DRAPES) ×3 IMPLANT
DRSG PAD ABDOMINAL 8X10 ST (GAUZE/BANDAGES/DRESSINGS) ×3 IMPLANT
ELECT NEEDLE TIP 2.8 STRL (NEEDLE) ×3 IMPLANT
ELECT PENCIL ROCKER SW 15FT (MISCELLANEOUS) ×3 IMPLANT
ELECT REM PT RETURN 9FT ADLT (ELECTROSURGICAL) ×3
ELECTRODE REM PT RTRN 9FT ADLT (ELECTROSURGICAL) ×1 IMPLANT
GAUZE SPONGE 4X4 12PLY STRL (GAUZE/BANDAGES/DRESSINGS) ×3 IMPLANT
GAUZE SPONGE 4X4 16PLY XRAY LF (GAUZE/BANDAGES/DRESSINGS) ×3 IMPLANT
GLOVE BIO SURGEON STRL SZ7.5 (GLOVE) ×6 IMPLANT
GLOVE BIOGEL PI IND STRL 7.0 (GLOVE) ×2 IMPLANT
GLOVE BIOGEL PI INDICATOR 7.0 (GLOVE) ×4
GLOVE INDICATOR 8.0 STRL GRN (GLOVE) ×3 IMPLANT
GOWN STRL REUS W/TWL XL LVL3 (GOWN DISPOSABLE) ×6 IMPLANT
KIT BASIN OR (CUSTOM PROCEDURE TRAY) ×3 IMPLANT
LUBRICANT JELLY K Y 4OZ (MISCELLANEOUS) ×3 IMPLANT
NDL SAFETY ECLIPSE 18X1.5 (NEEDLE) ×2 IMPLANT
NEEDLE HYPO 18GX1.5 SHARP (NEEDLE) ×4
NEEDLE HYPO 25X1 1.5 SAFETY (NEEDLE) ×3 IMPLANT
NS IRRIG 1000ML POUR BTL (IV SOLUTION) ×6 IMPLANT
PACK BASIC VI WITH GOWN DISP (CUSTOM PROCEDURE TRAY) ×3 IMPLANT
SHEARS HARMONIC 9CM CVD (BLADE) ×3 IMPLANT
SPONGE HEMORRHOID 8X3CM (HEMOSTASIS) ×3 IMPLANT
SPONGE LAP 4X18 X RAY DECT (DISPOSABLE) ×3 IMPLANT
SPONGE SURGIFOAM ABS GEL 100 (HEMOSTASIS) IMPLANT
SUT CHROMIC 2 0 SH (SUTURE) IMPLANT
SUT CHROMIC 3 0 SH 27 (SUTURE) IMPLANT
SYR CONTROL 10ML LL (SYRINGE) ×6 IMPLANT
TOWEL OR 17X26 10 PK STRL BLUE (TOWEL DISPOSABLE) ×6 IMPLANT
TOWEL OR NON WOVEN STRL DISP B (DISPOSABLE) ×3 IMPLANT
YANKAUER SUCT BULB TIP 10FT TU (MISCELLANEOUS) ×3 IMPLANT

## 2016-07-03 NOTE — Brief Op Note (Signed)
07/03/2016  8:59 AM  PATIENT:  Kimberly [name]  61 y.o. female  PRE-OPERATIVE DIAGNOSIS:  ANAL WARTS  POST-OPERATIVE DIAGNOSIS:  ANAL WARTS, HEMRROIDS  PROCEDURE:  Procedure(s): RECTAL EXAM UNDER ANESTHESIA (N/A) EXCISION & FULGURATION PERIANAL WARTS & EXCISIONAL BIOSY OF EXTERNAL HEMRROID (N/A)  SURGEON:  Surgeon(s) and Role:    * Greer Pickerel, MD - Primary  PHYSICIAN ASSISTANT:   ASSISTANTS: none   ANESTHESIA:   general  EBL:  MINIMAL   BLOOD ADMINISTERED:none  DRAINS: none   LOCAL MEDICATIONS USED:  LIDOCAINE WITH EPI; EXPAREL  SPECIMEN:  Source of Specimen:  RIGHT PERIANAL WARTS; POSTERIOR EXT HEMORRHOID WITH WARTS  DISPOSITION OF SPECIMEN:  PATHOLOGY  COUNTS:  YES  TOURNIQUET:  * No tourniquets in log *  DICTATION: .Other Dictation: Dictation Number [id number]  PLAN OF CARE: Admit for overnight observation  PATIENT DISPOSITION:  PACU - hemodynamically stable.   Delay start of Pharmacological VTE agent (>24hrs) due to surgical blood loss or risk of bleeding: no  Leighton Ruff. Redmond Pulling, MD, FACS General, Bariatric, & Minimally Invasive Surgery [facility] Surgery, [place]

## 2016-07-03 NOTE — Anesthesia Procedure Notes (Signed)
Procedure Name: Intubation Date/Time: 07/03/2016 7:38 AM Performed by: Glory Buff Pre-anesthesia Checklist: Patient identified, Emergency Drugs available, Suction available and Patient being monitored Patient Re-evaluated:Patient Re-evaluated prior to inductionOxygen Delivery Method: Circle system utilized Preoxygenation: Pre-oxygenation with 100% oxygen Intubation Type: IV induction Ventilation: Mask ventilation without difficulty Laryngoscope Size: Miller and 3 Grade View: Grade I Tube type: Oral Tube size: 7.0 mm Number of attempts: 1 Airway Equipment and Method: Stylet and Oral airway Placement Confirmation: ETT inserted through vocal cords under direct vision,  positive ETCO2 and breath sounds checked- equal and bilateral Secured at: 20 cm Tube secured with: Tape Dental Injury: Teeth and Oropharynx as per pre-operative assessment

## 2016-07-03 NOTE — Anesthesia Preprocedure Evaluation (Addendum)
Anesthesia Evaluation  Patient identified by MRN, date of birth, ID band Patient awake    Reviewed: Allergy & Precautions, NPO status , Patient's Chart, lab work & pertinent test results  Airway Mallampati: II   Neck ROM: full    Dental   Pulmonary shortness of breath, former smoker,    breath sounds clear to auscultation       Cardiovascular hypertension,  Rhythm:regular Rate:Normal     Neuro/Psych    GI/Hepatic   Endo/Other  diabetes, Type 2, Insulin DependentHyperthyroidism   Renal/GU ESRF and DialysisRenal disease     Musculoskeletal   Abdominal   Peds  Hematology  (+) anemia ,   Anesthesia Other Findings   Reproductive/Obstetrics                             Anesthesia Physical Anesthesia Plan  ASA: III  Anesthesia Plan: General   Post-op Pain Management:    Induction: Intravenous  Airway Management Planned: LMA  Additional Equipment:   Intra-op Plan:   Post-operative Plan:   Informed Consent: I have reviewed the patients History and Physical, chart, labs and discussed the procedure including the risks, benefits and alternatives for the proposed anesthesia with the patient or authorized representative who has indicated his/her understanding and acceptance.     Plan Discussed with: Anesthesiologist, Surgeon and CRNA  Anesthesia Plan Comments:         Anesthesia Quick Evaluation

## 2016-07-03 NOTE — Anesthesia Postprocedure Evaluation (Signed)
Anesthesia Post Note  Patient: Kimberly [name]  Procedure(s) Performed: Procedure(s) (LRB): RECTAL EXAM UNDER ANESTHESIA (N/A) EXCISION FULGURATION ANAL WART EXCISIONAL BIOSY OF EXTERNAL HEMORROID (N/A)  Patient location during evaluation: PACU Anesthesia Type: General Level of consciousness: awake and alert and patient cooperative Pain management: pain level controlled Vital Signs Assessment: post-procedure vital signs reviewed and stable Respiratory status: spontaneous breathing and respiratory function stable Cardiovascular status: stable Anesthetic complications: no       Last Vitals:  Vitals:   07/03/16 0945 07/03/16 1000  BP: (!) 149/76   Pulse: 66 67  Resp: (!) 22 16  Temp:      Last Pain:  Vitals:   07/03/16 0945  TempSrc:   PainSc: [place] S

## 2016-07-03 NOTE — Transfer of Care (Signed)
Immediate Anesthesia Transfer of Care Note  Patient: Kimberly [name]  Procedure(s) Performed: Procedure(s): RECTAL EXAM UNDER ANESTHESIA (N/A) EXCISION FULGURATION ANAL WART EXCISIONAL BIOSY OF EXTERNAL HEMORROID (N/A)  Patient Location: PACU  Anesthesia Type:General  Level of Consciousness: awake, alert  and oriented  Airway & Oxygen Therapy: Patient Spontanous Breathing and Patient connected to face mask oxygen  Post-op Assessment: Report given to RN and Post -op Vital signs reviewed and stable  Post vital signs: Reviewed and stable  Last Vitals:  Vitals:   07/03/16 0556  BP: (!) 167/72  Pulse: 60  Resp: 18  Temp: 36.5 C    Last Pain:  Vitals:   07/03/16 0609  TempSrc:   PainSc: 0-No pain      Patients Stated Pain Goal: 4 (27/78/24 2353)  Complications: No apparent anesthesia complications

## 2016-07-03 NOTE — Discharge Instructions (Signed)
[facility] _______[facility]  RECTAL SURGERY POST OP INSTRUCTIONS: POST OP INSTRUCTIONS  Always review your discharge instruction sheet given to you by the facility where your surgery was performed. IF YOU HAVE DISABILITY OR FAMILY LEAVE FORMS, YOU MUST BRING THEM TO THE OFFICE FOR PROCESSING.   DO NOT GIVE THEM TO YOUR DOCTOR.  EXPECT SOME BLEEDING THE FIRST FEW DAYS  1. A  prescription for pain medication may be given to you upon discharge.  Take your pain medication as prescribed, if needed.  If narcotic pain medicine is not needed, then you may take acetaminophen (Tylenol) &/or ibuprofen (Advil) as needed. 2. Take your usually prescribed medications unless otherwise directed. 3. If you need a refill on your pain medication, please contact your pharmacy.  They will contact our office to request authorization. Prescriptions will not be filled after 5 pm or on week-ends. 4. You should follow a light diet the first 48 hours after arrival home, such as soup and crackers, etc.  Be sure to include lots of fluids daily.  Resume your normal diet 2-3 days after surgery.. 5. Most patients will experience some swelling and discomfort in the rectal area. Ice packs, reclining and warm tub soaks will help.  Swelling and discomfort can take several days to resolve.  6. It is common to experience some constipation if taking pain medication after surgery.  Increasing fluid intake and taking a stool softener (such as Colace) will usually help or prevent this problem from occurring.  A mild laxative (Milk of Magnesia or Miralax) should be taken according to package directions if there are no bowel movements after 48 hours. 7. Unless discharge instructions indicate otherwise, leave your bandage dry and in place for 24 hours, or remove the bandage if you have a bowel movement. You will notice a small amount of bleeding with bowel movements for the first few days. You may have some packing in the rectum  which will come out over the first day or two. You will need to wear an absorbent pad or soft cotton gauze in your underwear until the drainage stops.it. 8. ACTIVITIES:  You may resume regular (light) daily activities beginning the next day--such as daily self-care, walking, climbing stairs--gradually increasing activities as tolerated.  You may have sexual intercourse when it is comfortable.  Refrain from any heavy lifting or straining until approved by your doctor. a. You may drive when you are no longer taking prescription pain medication, you can comfortably wear a seatbelt, and you can safely maneuver your car and apply brakes. b. RETURN TO WORK: : ____________________ c.  9. You should see your doctor in the office for a follow-up appointment approximately 2-3 weeks after your surgery.  Make sure that you call for this appointment within a day or two after you arrive home to insure a convenient appointment time. 10. OTHER INSTRUCTIONS:  __________________________________________________________________________________________________________________________________________________________________________________________  WHEN TO CALL YOUR DOCTOR: 1. Fever over 101.0 2. Inability to urinate 3. Nausea and/or vomiting 4. Extreme swelling or bruising 5. Continued bleeding from rectum. 6. Increased pain, redness, or drainage from the incision 7. Constipation  The clinic staff is available to answer your questions during regular business hours.  Please dont hesitate to call and ask to speak to one of the nurses for clinical concerns.  If you have a medical emergency, go to the nearest emergency room or call 911.  A surgeon from [facility] is always on call at the hospital   [address]  [address] [place], [place], [place]  [address] ?  [address], [place], [place]   [id number] [phone] ? [phone] ? FAX [phone] Web site: [email or web address]

## 2016-07-03 NOTE — H&P (Addendum)
Kimberly [name] is an 61 y.o. female.   Chief Complaint: here for surgery HPI: The patient is a 61 year old female who presents to discuss consultation. She comes in for follow-up after being seen in July and September for perianal warts. She was started on Aldara in efforts to try to decrease the burden of warts to make surgery less involved. She states that she is going 3 times a week at night. At her last visit, she had not had much response to topical ointment however she wanted to get it one more trial before proceeding with surgery. She states that she really hasn't noticed any change. She denies any medical changes since her last visit. 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 She had a negative nuclear stress tests in late August. She denies any chest pain, chest pressure, short of breath, TIAs or amaurosis fugax. She takes immunosuppression for her kidney transplant which is managed by her nephrologist. She also has diabetes mellitus. She denies any fever, chills, nausea or vomiting. She denies any dysuria. She denies any melena or hematochezia. Her colonoscopy report showed some active inflammation and ulceration near her ileocecal valve and ascending colon. Pathologist could not rule out possibility of Crohn's disease  Review of systems-a comprehensive 10 point review of systems was performed and all systems are negative except for what is mentioned in HPI  Past Medical History:  Diagnosis Date  . Adenomatous colon polyp 08/2007  . Anemia   . Diabetes mellitus type 2, controlled ([place])   . End stage renal disease ([place])    "not on dialysis" (06/24/2016)  . Goiter   . Gout   . Hyperlipidemia   . Hypertension   . Hyperthyroidism   . Kidney disease   . Osteopenia   . Seasonal allergies   . 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 AV  FISTULA PLACEMENT Left 2008  . colonscopy  02/2016  . GANGLION CYST EXCISION Right 1994  . KIDNEY TRANSPLANT  June 2010  . SHOULDER ARTHROSCOPY W/ ROTATOR CUFF REPAIR Right yrs ago  . UPPER GI ENDOSCOPY  sept   2017    Family History  Problem Relation Age of Onset  . Colon cancer Sister   . Colon cancer Brother   . Heart disease Mother   . Cancer - Other Father     gum to throat   Social History:  reports that she quit smoking about 18 years ago. Her smoking use included Cigarettes. She has a 20.00 pack-year smoking history. She has never used smokeless tobacco. She reports that she does not drink alcohol or use drugs.  Allergies: No Known Allergies  Medications Prior to Admission  Medication Sig Dispense Refill  . ACCU-CHEK SMARTVIEW test strip 1 each by Other route 2 (two) times daily. Use twice daily as directed  0  . amLODipine (NORVASC) 10 MG tablet Take 10 mg by mouth daily.    . B-D ULTRAFINE III SHORT PEN 31G X 8 MM MISC Use twice daily as directed  0  . epoetin alfa (EPOGEN,PROCRIT) 14431 UNIT/ML injection Inject 20,000 Units into the skin every 14 (fourteen) days.     Marland Kitchen ezetimibe-simvastatin (VYTORIN) 10-20 MG per tablet Take 1 tablet by mouth at bedtime.    . insulin glargine (LANTUS) 100 UNIT/ML injection Inject 0.15 mLs (15 Units total) into the skin at bedtime.  4.5 mL 0  . labetalol (NORMODYNE) 200 MG tablet Take 1 tablet (200 mg total) by mouth 2 (two) times daily. 60 tablet 0  . mycophenolate (CELLCEPT) 250 MG capsule Take 500 mg by mouth 2 (two) times daily.     Marland Kitchen omeprazole (PRILOSEC) 40 MG capsule Take 40 mg by mouth daily.  0  . potassium chloride (K-DUR,KLOR-CON) 10 MEQ tablet Take 10 mEq by mouth daily.    . predniSONE (DELTASONE) 5 MG tablet Take 5 mg by mouth daily.    . tacrolimus (PROGRAF) 1 MG capsule Take 3-4 mg by mouth 2 (two) times daily. 4 mg in the morning & 3 mg at bedtime      Results for orders placed or performed during the hospital encounter  of 07/03/16 (from the past 48 hour(s))  Glucose, capillary     Status: None   Collection Time: 07/03/16  5:27 AM  Result Value Ref Range   Glucose-Capillary 99 65 - 99 mg/dL   Comment 1 Notify RN    No results found.  Review of Systems  Constitutional: Negative for weight loss.  HENT: Negative for nosebleeds.   Eyes: Negative for blurred vision.  Respiratory: Negative for shortness of breath.   Cardiovascular: Negative for chest pain, palpitations, orthopnea and PND.       Denies DOE  Genitourinary: Negative for dysuria and hematuria.       Kidney tx recipient  Musculoskeletal: Negative.   Skin: Negative for itching and rash.  Neurological: Negative for dizziness, focal weakness, seizures, loss of consciousness and headaches.       Denies TIAs, amaurosis fugax  Endo/Heme/Allergies: Does not bruise/bleed easily.  Psychiatric/Behavioral: The patient is not nervous/anxious.     Blood pressure (!) 167/72, pulse 60, temperature 97.7 F (36.5 C), temperature source Oral, resp. rate 18, height 5\' 6"  (1.676 m), weight 64.9 kg (143 lb), SpO2 98 %. Physical Exam  Vitals reviewed. Constitutional: She is oriented to person, place, and time. She appears well-developed and well-nourished. No distress.  HENT:  Head: Normocephalic and atraumatic.  Right Ear: External ear normal.  Left Ear: External ear normal.  Eyes: Conjunctivae are normal. No scleral icterus.  Neck: Normal range of motion. Neck supple. No tracheal deviation present. No thyromegaly present.  Cardiovascular: Normal rate and normal heart sounds.   Respiratory: Effort normal and breath sounds normal. No stridor. No respiratory distress. She has no wheezes.  Genitourinary:  Genitourinary Comments: Perianal condyloma  Musculoskeletal: She exhibits no edema or tenderness.  Lymphadenopathy:    She has no cervical adenopathy.  Neurological: She is alert and oriented to person, place, and time. She exhibits normal muscle tone.   Skin: Skin is warm and dry. No rash noted. She is not diaphoretic. No erythema. No pallor.  Psychiatric: She has a normal mood and affect. Her behavior is normal. Judgment and thought content normal.     Assessment/Plan Perianal condyolma DM H/o kidney tx HTN HPL  To or for eua, exicison/fulguration of anal warts All questions asked/answered Extensively discussed risks/benefits/postop course in clinic  Rhylie Stehr M. Redmond Pulling, MD, [place] [id number] [facility], [place]   Gayland Curry, MD 07/03/2016, 7:28 AM

## 2016-07-04 DIAGNOSIS — I120 Hypertensive chronic kidney disease with stage 5 chronic kidney disease or end stage renal disease: Secondary | ICD-10-CM | POA: Diagnosis not present

## 2016-07-04 DIAGNOSIS — D631 Anemia in chronic kidney disease: Secondary | ICD-10-CM | POA: Diagnosis not present

## 2016-07-04 DIAGNOSIS — E1122 Type 2 diabetes mellitus with diabetic chronic kidney disease: Secondary | ICD-10-CM | POA: Diagnosis not present

## 2016-07-04 DIAGNOSIS — A630 Anogenital (venereal) warts: Secondary | ICD-10-CM | POA: Diagnosis not present

## 2016-07-04 DIAGNOSIS — K648 Other hemorrhoids: Secondary | ICD-10-CM | POA: Diagnosis not present

## 2016-07-04 DIAGNOSIS — K644 Residual hemorrhoidal skin tags: Secondary | ICD-10-CM | POA: Diagnosis not present

## 2016-07-04 LAB — BASIC METABOLIC PANEL
ANION GAP: 6 (ref 5–15)
BUN: 36 mg/dL — ABNORMAL HIGH (ref 6–20)
CHLORIDE: 118 mmol/L — AB (ref 101–111)
CO2: 15 mmol/L — ABNORMAL LOW (ref 22–32)
Calcium: 8.9 mg/dL (ref 8.9–10.3)
Creatinine, Ser: 2.28 mg/dL — ABNORMAL HIGH (ref 0.44–1.00)
GFR calc Af Amer: 25 mL/min — ABNORMAL LOW (ref >60)
GFR, EST NON AFRICAN AMERICAN: 22 mL/min — AB (ref >60)
GLUCOSE: 213 mg/dL — AB (ref 65–99)
POTASSIUM: 4.1 mmol/L (ref 3.5–5.1)
SODIUM: 139 mmol/L (ref 135–145)

## 2016-07-04 LAB — CBC
HEMATOCRIT: 32.3 % — AB (ref 36.0–46.0)
HEMOGLOBIN: 10.5 g/dL — AB (ref 12.0–15.0)
MCH: 28.2 pg (ref 26.0–34.0)
MCHC: 32.5 g/dL (ref 30.0–36.0)
MCV: 86.8 fL (ref 78.0–100.0)
Platelets: 213 10*3/uL (ref 150–400)
RBC: 3.72 MIL/uL — ABNORMAL LOW (ref 3.87–5.11)
RDW: 16.8 % — ABNORMAL HIGH (ref 11.5–15.5)
WBC: 3.6 10*3/uL — AB (ref 4.0–10.5)

## 2016-07-04 LAB — GLUCOSE, CAPILLARY: Glucose-Capillary: 156 mg/dL — ABNORMAL HIGH (ref 65–99)

## 2016-07-04 MED ORDER — OXYCODONE HCL 5 MG PO TABS: 5.0000 mg | ORAL_TABLET | ORAL | 0 refills | Status: DC | PRN

## 2016-07-04 NOTE — Op Note (Signed)
NAME:  LORANE, COUSAR                    ACCOUNT NO.:  MEDICAL RECORD NO.:  [id number]  LOCATION:                                 FACILITY:  PHYSICIAN:  Leighton Ruff. Redmond Pulling, MD, FACSDATE OF BIRTH:  1955/12/09  DATE OF PROCEDURE:  07/03/2016 DATE OF DISCHARGE:                              OPERATIVE REPORT   PREOPERATIVE DIAGNOSIS:  Perianal warts with prolapsed external hemorrhoids.  POSTOPERATIVE DIAGNOSIS:  Perianal warts with prolapsed external Hemorrhoids with warts.  PROCEDURE: 1. Exam under anesthesia. 2. Excision and fulguration of perianal warts. 3. Excisional biopsy of prolapsed anterior external hemorrhoid with warts.  SURGEON:  Leighton Ruff. Redmond Pulling, MD, FACS.  ANESTHESIA:  General.  EBL:  Minimal.  SPECIMEN: 1. Right perianal warts. 2. Anterior external hemorrhoid with warts.  COMPLICATIONS:  None immediately apparent.  INDICATIONS:  The patient is a pleasant 61 year old African-American female who has had a kidney transplant, who presented to the office with complaints of warts.  On exam at that time, she had circumferential perianal warts along with for the most part circumferential prolapsed, nonthrombosed external hemorrhoidal tissue.  Due to the size and number of warts and the burden, we started with topical treatment consisting of Aldara to try to decrease the burden of warts.  However, we discussed that she would eventually need surgery.  She was followed for several months.  Unfortunately, she had minimal to no response to Aldara topical ointment.  At this point, we discussed that surgical intervention would be the best route to remove the warts.  We had a prolonged conversation on several visits regarding risks and benefits of surgery including the likely possibility of recurrence.  We also talked about the possibility of needing to do additional procedures.  We talked with her nephrologist to said that none of her immunosuppression medication needed to be  held perioperatively.  DESCRIPTION OF PROCEDURE:  After obtaining informed consent, she was taken the OR3 at [facility].  General endotracheal anesthesia was established.  Sequential compression devices were placed.  She was then placed in the prone jack-knife position.  Her buttocks were taped apart. She had patchy circumferential perianal warts along with primarily posterior and left lateral prolapsed nonthrombosed external hemorrhoidal tissue.  On this external hemorrhoidal tissue, there was some pale mucosa that looked slightly atypical.  It was unclear if it was due to chronic Aldara use or atypical mucosa.  The entire area was prepped with Betadine.  A surgical time-out was performed.  Because of her immunosuppression, I did elect to give her preoperative IV antibiotics. At this point, I infiltrated some local with epi underneath the epidermis just for hemostasis.  I then sharply started to excise the warts on the right side and then cauterized the base.  The warts extended for about 2.5 to 3 cm from the anal verge.  Once I had excised and fulgurated the majority of the all the visible warts on the right side, I moved to the left side.  There was a little bit more dense burden of warts on the left side.  Again, local was infiltrated to help with hemostasis.  I placed epinephrine-soaked gauze while I  was working on the right side while I was working on the left side.  Again, these were sharply debrided and then the base of the wart was cauterized.  I did encroach and incorporated some external hemorrhoidal tissue on the left side.  With respect to the anterior external hemorrhoid that was almost prolapsed external hemorrhoidal tissue underneath the lip.  The external hemorrhoid that was prolapsed in the anterior position, there was a large burden of warts.  I decided that I would just need to do an excisional biopsy of this external hemorrhoid. Local was infiltrated for  hemostasis.  I then made a small incision into the skin with a scalpel.  Then using hemostat, I lifted up the hemorrhoid off the underlying external sphincter muscle.  I then used a Harmonic Scalpel for hemostasis and elliptically excised this prolapsed external hemorrhoid along with the warts.  Cautery was then used on the exposed tissue for hemostasis as well as fulgurate the wart.  Prior to beginning the case, I did do anoscopy.  There were no warts within the anal canal per se.  There was patchy areas of slightly irritated mucosa and some of this was taken with the posterior external hemorrhoid that was biopsied.  At this point, I infiltrated Exparel in a regional fashion for a block.  Hemostasis had been achieved.  A piece of Gelfoam was placed to the rectum.  Dibucaine ointment was then applied to the perianal skin, followed by dry gauze and mesh underwear.  The patient was placed in the supine position, extubated and taken to recovery room in stable condition.  All needle and instruments counts were correct x2. There were no immediate complications.     Leighton Ruff. Redmond Pulling, MD, FACS     EMW/MEDQ  D:  07/03/2016  T:  07/04/2016  Job:  [id number]

## 2016-07-04 NOTE — Discharge Summary (Signed)
Physician Discharge Summary  SALEEMAH MOLLENHAUER CVE:[id number] DOB: Nov 18, 1955 DOA: 07/03/2016  PCP: Marton Redwood, MD  Admit date: 07/03/2016 Discharge date: 07/04/2016  Recommendations for Outpatient Follow-up:  1.   Follow-up Information    Gayland Curry, MD. Schedule an appointment as soon as possible for a visit in 2 week(s).   Specialty:  General Surgery Why:  For wound re-check Contact information: [address] [place] [address] [phone]        Corliss Parish A, MD. Schedule an appointment as soon as possible for a visit in 2 week(s).   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   Surgical Procedure: eua, excision and fulguration of perianal warts; excisional biopsy of posterior external hemorrhoid with warts  Discharge Condition: good Disposition: home  Diet recommendation: diabetic  Filed Weights   07/03/16 0609 07/03/16 1215  Weight: 64.9 kg (143 lb) 64.9 kg (143 lb)    History of present illness:  The patient is a 61 year old female who presents to discuss consultation. She comes in for follow-up after being seen in July and September for perianal warts. She was started on [place] in efforts to try to decrease the burden of warts to make surgery less involved. She states that she is going 3 times a week at night. At her last visit, she had not had much response to topical ointment however she wanted to get it one more trial before proceeding with surgery. She states that she  really hasn't noticed any change. She denies any medical changes since her last visit. 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 She had a negative nuclear stress tests in late August. She denies any chest pain, chest pressure, short of breath, TIAs or amaurosis fugax. She takes immunosuppression for her kidney transplant which is managed by her nephrologist. She also has diabetes mellitus. She denies any fever, chills, nausea or vomiting. She denies any dysuria. She denies any melena or hematochezia. Her colonoscopy report showed some active inflammation and ulceration near her ileocecal valve and ascending colon. Pathologist could not rule out possibility of Crohn's disease  Hospital Course:  She underwent the above mentioned procedure and kept overnight for observation to monitor for bleeding. She did well. She had minimal pain. She did have some serosang bloody drainage but hgb stable. She was tolerating a diet and ambulating and had voided. We discussed dc instructions.   BP (!) 151/65 (BP Location: Right Arm)   Pulse 80   Temp 98.2 F (36.8 C) (Oral)   Resp 18   Ht 5\' 6"  (1.676 m)   Wt 64.9 kg (143 lb)   SpO2 96%   BMI 23.08 kg/m   Gen: alert, NAD, non-toxic appearing Pupils: equal, no scleral icterus Pulm: Lungs clear to auscultation, symmetric chest rise CV: regular rate and rhythm Abd: soft, nontender, nondistended.  Rectal: some swelling of remaining prolapased ext hemorrhoid tissue, no cellulitis, some blood stained drainage on gauze Ext: no edema,  no calf tenderness Skin: no rash, no jaundice    Discharge Instructions  Discharge Instructions    Call MD for:    Complete by:  As directed    Temperature >101   Call MD for:  hives    Complete by:  As directed    Call MD for:  persistant dizziness or light-headedness    Complete by:  As directed    Call MD for:  persistant nausea and vomiting    Complete  by:  As directed    Call MD for:  redness, tenderness, or signs of infection (pain, swelling, redness, odor or green/yellow discharge around incision site)    Complete by:  As directed    Call MD for:  severe uncontrolled pain    Complete by:  As directed    Diet Carb Modified    Complete by:  As directed    Discharge instructions    Complete by:  As directed    See [facility] discharge instructions   Discharge wound care:    Complete by:  As directed    See [facility] discharge instructions   Increase activity slowly    Complete by:  As directed      Allergies as of 07/04/2016   No Known Allergies     Medication List    TAKE these medications   ACCU-CHEK SMARTVIEW test strip Generic drug:  glucose blood 1 each by Other route 2 (two) times daily. Use twice daily as directed   amLODipine 10 MG tablet Commonly known as:  NORVASC Take 10 mg by mouth daily.   B-D ULTRAFINE III SHORT PEN 31G X 8 MM Misc Generic drug:  Insulin Pen Needle Use twice daily as directed   epoetin alfa 20000 UNIT/ML injection Commonly known as:  EPOGEN,PROCRIT Inject 20,000 Units into the skin every 14 (fourteen) days.   ezetimibe-simvastatin 10-20 MG tablet Commonly known as:  VYTORIN Take 1 tablet by mouth at bedtime.   insulin glargine 100 UNIT/ML injection Commonly known as:  LANTUS Inject 0.15 mLs (15 Units total) into the skin at bedtime.   labetalol 200 MG tablet Commonly known as:  NORMODYNE Take 1 tablet (200 mg total) by mouth 2 (two) times daily.   mycophenolate 250 MG capsule Commonly known as:  CELLCEPT Take 500 mg by mouth 2 (two) times daily.   omeprazole 40 MG capsule Commonly known as:  PRILOSEC Take 40 mg by mouth daily.   oxyCODONE 5 MG immediate release tablet Commonly known as:  Oxy IR/ROXICODONE Take 1-2 tablets (5-10 mg total) by mouth every 4 (four) hours as needed for moderate pain.   potassium chloride 10 MEQ tablet Commonly known as:  K-DUR,KLOR-CON Take 10 mEq by  mouth daily.   predniSONE 5 MG tablet Commonly known as:  DELTASONE Take 5 mg by mouth daily.   tacrolimus 1 MG capsule Commonly known as:  PROGRAF Take 3-4 mg by mouth 2 (two) times daily. 4 mg in the morning & 3 mg at bedtime      Follow-up Information    Gayland Curry, MD. Schedule an appointment as soon as possible for a visit in 2 week(s).   Specialty:  General Surgery Why:  For wound re-check Contact information: [address] [place] [address] [phone]        Corliss Parish A, MD. Schedule an appointment as soon as possible for a visit in 2 week(s).   Specialty:  Nephrology Contact information: [place]  [address] [phone]            The results of significant diagnostics from this hospitalization (including imaging, microbiology, ancillary and laboratory) are listed below for reference.    Significant Diagnostic Studies: Dg Chest 2 View  Result Date: 06/24/2016 CLINICAL DATA:  Status post fall when arising from bedthis morning. Patient ports an episode of vomiting but no other symptoms now. EXAM: CHEST  2 VIEW COMPARISON:  Chest x-ray of August 04, 2005 FINDINGS: The lungs are well-expanded. There is no focal infiltrate. There is mild hemidiaphragm flattening on the lateral view. The heart and pulmonary vascularity are normal. There is calcification in the wall of the aortic arch. There is no pleural effusion. There is multilevel degenerative disc disease of the thoracic spine. IMPRESSION: No acute cardiopulmonary abnormality. Probable chronic bronchitic changes. Electronically Signed   By: David  Martinique M.D.   On: 06/24/2016 10:28   Ct Head Wo Contrast  Result Date: 06/24/2016 CLINICAL DATA:  Fall this morning. EXAM: CT HEAD WITHOUT CONTRAST TECHNIQUE: Contiguous axial images were obtained from the base of the skull through the vertex without intravenous contrast. COMPARISON:  None. FINDINGS: Brain: No acute intracranial  abnormality. Specifically, no hemorrhage, hydrocephalus, mass lesion, acute infarction, or significant intracranial injury. Vascular: No hyperdense vessel or unexpected calcification. Skull: No acute calvarial abnormality. Sinuses/Orbits: Mucosal thickening throughout the paranasal sinuses. Mastoid air cells are clear. Orbital soft tissues unremarkable. Other:  None IMPRESSION: No intracranial abnormality. Chronic sinusitis. Electronically Signed   By: Rolm Baptise M.D.   On: 06/24/2016 11:22    Microbiology: No results found for this or any previous visit (from the past 240 hour(s)).   Labs: Basic Metabolic Panel:  Recent Labs Lab 07/01/16 1204 07/04/16 0406  NA 141 139  K 3.4* 4.1  CL 117* 118*  CO2 17* 15*  GLUCOSE 181* 213*  BUN 32* 36*  CREATININE 2.43* 2.28*  CALCIUM 8.6* 8.9   Liver Function Tests: No results for input(s): AST, ALT, ALKPHOS, BILITOT, PROT, ALBUMIN in the last 168 hours. No results for input(s): LIPASE, AMYLASE in the last 168 hours. No results for input(s): AMMONIA in the last 168 hours. CBC:  Recent Labs Lab 06/27/16 1405 07/01/16 1204 07/04/16 0406  WBC  --  4.8 3.6*  NEUTROABS  --  4.2  --   HGB 10.6* 10.9* 10.5*  HCT  --  33.7* 32.3*  MCV  --  87.3 86.8  PLT  --  231 213   Cardiac Enzymes: No results for input(s): CKTOTAL, CKMB, CKMBINDEX, TROPONINI in the last 168 hours. BNP: BNP (last 3 results)  Recent Labs  06/24/16 0956  BNP 97.9    ProBNP (last 3 results) No results for input(s): PROBNP in the last 8760 hours.  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, [place] [phone] 07/04/2016, 7:48 AM

## 2016-07-07 ENCOUNTER — Encounter (HOSPITAL_COMMUNITY)
Admission: RE | Admit: 2016-07-07 | Discharge: 2016-07-07 | Disposition: A | Discharge status: Home / Self Care | Payer: Medicare Other | Source: Ambulatory Visit | Attending: Nephrology | Admitting: Nephrology

## 2016-07-07 DIAGNOSIS — D638 Anemia in other chronic diseases classified elsewhere: Secondary | ICD-10-CM | POA: Diagnosis not present

## 2016-07-07 DIAGNOSIS — N184 Chronic kidney disease, stage 4 (severe): Principal | ICD-10-CM

## 2016-07-07 DIAGNOSIS — D631 Anemia in chronic kidney disease: Secondary | ICD-10-CM

## 2016-07-07 LAB — POCT HEMOGLOBIN-HEMACUE: Hemoglobin: 10.1 g/dL — ABNORMAL LOW (ref 12.0–15.0)

## 2016-07-07 MED ORDER — EPOETIN ALFA 20000 UNIT/ML IJ SOLN
20000.0000 [IU] | INTRAMUSCULAR | Status: DC
  Administered 2016-07-07: 13:00:00 20000 [IU] via SUBCUTANEOUS

## 2016-07-07 MED ORDER — EPOETIN ALFA 20000 UNIT/ML IJ SOLN
INTRAMUSCULAR | Status: AC
  Filled 2016-07-07: qty 1

## 2016-07-07 NOTE — Progress Notes (Signed)
Patient stated that she is only supposed to have her hemoglobin drawn here and all other labs at Dr. Raul Del office. Called [facility] and spoke with Crystal. She will confer with Erline Levine and get back to [place]. Orders received for today to draw hemocue only.

## 2016-07-16 DIAGNOSIS — D638 Anemia in other chronic diseases classified elsewhere: Secondary | ICD-10-CM | POA: Diagnosis not present

## 2016-07-16 DIAGNOSIS — A630 Anogenital (venereal) warts: Secondary | ICD-10-CM | POA: Diagnosis not present

## 2016-07-16 DIAGNOSIS — N184 Chronic kidney disease, stage 4 (severe): Secondary | ICD-10-CM | POA: Diagnosis not present

## 2016-07-16 DIAGNOSIS — Z6824 Body mass index (BMI) 24.0-24.9, adult: Secondary | ICD-10-CM | POA: Diagnosis not present

## 2016-07-16 DIAGNOSIS — K625 Hemorrhage of anus and rectum: Secondary | ICD-10-CM | POA: Diagnosis not present

## 2016-07-21 ENCOUNTER — Encounter (HOSPITAL_COMMUNITY)
Admission: RE | Admit: 2016-07-21 | Discharge: 2016-07-21 | Disposition: A | Discharge status: Home / Self Care | Payer: Medicare Other | Source: Ambulatory Visit | Attending: Nephrology | Admitting: Nephrology

## 2016-07-21 DIAGNOSIS — N184 Chronic kidney disease, stage 4 (severe): Principal | ICD-10-CM

## 2016-07-21 DIAGNOSIS — D638 Anemia in other chronic diseases classified elsewhere: Secondary | ICD-10-CM | POA: Diagnosis not present

## 2016-07-21 DIAGNOSIS — D631 Anemia in chronic kidney disease: Secondary | ICD-10-CM

## 2016-07-21 LAB — POCT HEMOGLOBIN-HEMACUE: Hemoglobin: 9.6 g/dL — ABNORMAL LOW (ref 12.0–15.0)

## 2016-07-21 MED ORDER — EPOETIN ALFA 20000 UNIT/ML IJ SOLN
20000.0000 [IU] | INTRAMUSCULAR | Status: DC
  Administered 2016-07-21: 20000 [IU] via SUBCUTANEOUS

## 2016-07-21 MED ORDER — EPOETIN ALFA 20000 UNIT/ML IJ SOLN
INTRAMUSCULAR | Status: AC
  Administered 2016-07-21: 20000 [IU] via SUBCUTANEOUS
  Filled 2016-07-21: qty 1

## 2016-07-22 DIAGNOSIS — Z940 Kidney transplant status: Secondary | ICD-10-CM | POA: Diagnosis not present

## 2016-07-22 DIAGNOSIS — I10 Essential (primary) hypertension: Secondary | ICD-10-CM | POA: Diagnosis not present

## 2016-07-22 DIAGNOSIS — E042 Nontoxic multinodular goiter: Secondary | ICD-10-CM | POA: Diagnosis not present

## 2016-07-22 DIAGNOSIS — E1129 Type 2 diabetes mellitus with other diabetic kidney complication: Secondary | ICD-10-CM | POA: Diagnosis not present

## 2016-07-22 DIAGNOSIS — D638 Anemia in other chronic diseases classified elsewhere: Secondary | ICD-10-CM | POA: Diagnosis not present

## 2016-07-22 DIAGNOSIS — Z6824 Body mass index (BMI) 24.0-24.9, adult: Secondary | ICD-10-CM | POA: Diagnosis not present

## 2016-07-24 ENCOUNTER — Other Ambulatory Visit: Payer: Self-pay | Admitting: Endocrinology

## 2016-07-24 DIAGNOSIS — E049 Nontoxic goiter, unspecified: Secondary | ICD-10-CM

## 2016-07-31 DIAGNOSIS — E119 Type 2 diabetes mellitus without complications: Secondary | ICD-10-CM | POA: Diagnosis not present

## 2016-07-31 DIAGNOSIS — Z794 Long term (current) use of insulin: Secondary | ICD-10-CM | POA: Diagnosis not present

## 2016-07-31 DIAGNOSIS — D631 Anemia in chronic kidney disease: Secondary | ICD-10-CM | POA: Diagnosis not present

## 2016-07-31 DIAGNOSIS — E876 Hypokalemia: Secondary | ICD-10-CM | POA: Diagnosis not present

## 2016-07-31 DIAGNOSIS — E0590 Thyrotoxicosis, unspecified without thyrotoxic crisis or storm: Secondary | ICD-10-CM | POA: Diagnosis not present

## 2016-07-31 DIAGNOSIS — N189 Chronic kidney disease, unspecified: Secondary | ICD-10-CM | POA: Diagnosis not present

## 2016-07-31 DIAGNOSIS — E785 Hyperlipidemia, unspecified: Secondary | ICD-10-CM | POA: Diagnosis not present

## 2016-07-31 DIAGNOSIS — Z9189 Other specified personal risk factors, not elsewhere classified: Secondary | ICD-10-CM | POA: Diagnosis not present

## 2016-07-31 DIAGNOSIS — D899 Disorder involving the immune mechanism, unspecified: Secondary | ICD-10-CM | POA: Diagnosis not present

## 2016-07-31 DIAGNOSIS — Z0000 Encounter for general adult medical examination without abnormal findings: Secondary | ICD-10-CM | POA: Diagnosis not present

## 2016-07-31 DIAGNOSIS — K529 Noninfective gastroenteritis and colitis, unspecified: Secondary | ICD-10-CM | POA: Diagnosis not present

## 2016-07-31 DIAGNOSIS — I129 Hypertensive chronic kidney disease with stage 1 through stage 4 chronic kidney disease, or unspecified chronic kidney disease: Secondary | ICD-10-CM | POA: Diagnosis not present

## 2016-08-01 ENCOUNTER — Ambulatory Visit
Admission: RE | Admit: 2016-08-01 | Discharge: 2016-08-01 | Disposition: A | Discharge status: Home / Self Care | Payer: Medicare Other | Source: Ambulatory Visit | Attending: Endocrinology | Admitting: Endocrinology

## 2016-08-01 DIAGNOSIS — E042 Nontoxic multinodular goiter: Secondary | ICD-10-CM | POA: Diagnosis not present

## 2016-08-01 DIAGNOSIS — E049 Nontoxic goiter, unspecified: Secondary | ICD-10-CM

## 2016-08-04 ENCOUNTER — Encounter (HOSPITAL_COMMUNITY)
Admission: RE | Admit: 2016-08-04 | Discharge: 2016-08-04 | Disposition: A | Discharge status: Home / Self Care | Payer: Medicare Other | Source: Ambulatory Visit | Attending: Nephrology | Admitting: Nephrology

## 2016-08-04 DIAGNOSIS — N184 Chronic kidney disease, stage 4 (severe): Secondary | ICD-10-CM | POA: Insufficient documentation

## 2016-08-04 DIAGNOSIS — D638 Anemia in other chronic diseases classified elsewhere: Secondary | ICD-10-CM | POA: Diagnosis not present

## 2016-08-04 DIAGNOSIS — D631 Anemia in chronic kidney disease: Secondary | ICD-10-CM

## 2016-08-04 LAB — POCT HEMOGLOBIN-HEMACUE: HEMOGLOBIN: 10.1 g/dL — AB (ref 12.0–15.0)

## 2016-08-04 MED ORDER — EPOETIN ALFA 20000 UNIT/ML IJ SOLN
INTRAMUSCULAR | Status: DC
Start: 2016-08-04 — End: 2016-08-05
  Filled 2016-08-04: qty 1

## 2016-08-04 MED ORDER — EPOETIN ALFA 20000 UNIT/ML IJ SOLN
20000.0000 [IU] | INTRAMUSCULAR | Status: DC
  Administered 2016-08-04: 20000 [IU] via SUBCUTANEOUS

## 2016-08-12 DIAGNOSIS — H1030 Unspecified acute conjunctivitis, unspecified eye: Secondary | ICD-10-CM | POA: Diagnosis not present

## 2016-08-12 DIAGNOSIS — Z6824 Body mass index (BMI) 24.0-24.9, adult: Secondary | ICD-10-CM | POA: Diagnosis not present

## 2016-08-18 ENCOUNTER — Encounter (HOSPITAL_COMMUNITY)
Admission: RE | Admit: 2016-08-18 | Discharge: 2016-08-18 | Disposition: A | Discharge status: Home / Self Care | Payer: Medicare Other | Source: Ambulatory Visit | Attending: Nephrology | Admitting: Nephrology

## 2016-08-18 DIAGNOSIS — D631 Anemia in chronic kidney disease: Secondary | ICD-10-CM

## 2016-08-18 DIAGNOSIS — N184 Chronic kidney disease, stage 4 (severe): Principal | ICD-10-CM

## 2016-08-18 DIAGNOSIS — D638 Anemia in other chronic diseases classified elsewhere: Secondary | ICD-10-CM | POA: Diagnosis not present

## 2016-08-18 LAB — POCT HEMOGLOBIN-HEMACUE: Hemoglobin: 10.4 g/dL — ABNORMAL LOW (ref 12.0–15.0)

## 2016-08-18 MED ORDER — EPOETIN ALFA 20000 UNIT/ML IJ SOLN
20000.0000 [IU] | INTRAMUSCULAR | Status: DC
  Administered 2016-08-18: 20000 [IU] via SUBCUTANEOUS

## 2016-08-18 MED ORDER — EPOETIN ALFA 20000 UNIT/ML IJ SOLN
INTRAMUSCULAR | Status: AC
  Filled 2016-08-18: qty 1

## 2016-08-26 DIAGNOSIS — Z794 Long term (current) use of insulin: Secondary | ICD-10-CM | POA: Diagnosis not present

## 2016-08-26 DIAGNOSIS — M109 Gout, unspecified: Secondary | ICD-10-CM | POA: Diagnosis not present

## 2016-08-26 DIAGNOSIS — Z7952 Long term (current) use of systemic steroids: Secondary | ICD-10-CM | POA: Diagnosis not present

## 2016-08-26 DIAGNOSIS — E139 Other specified diabetes mellitus without complications: Secondary | ICD-10-CM | POA: Diagnosis not present

## 2016-08-26 DIAGNOSIS — Z79899 Other long term (current) drug therapy: Secondary | ICD-10-CM | POA: Diagnosis not present

## 2016-08-26 DIAGNOSIS — E041 Nontoxic single thyroid nodule: Secondary | ICD-10-CM | POA: Diagnosis not present

## 2016-08-26 DIAGNOSIS — E119 Type 2 diabetes mellitus without complications: Secondary | ICD-10-CM | POA: Diagnosis not present

## 2016-08-26 DIAGNOSIS — E876 Hypokalemia: Secondary | ICD-10-CM | POA: Diagnosis not present

## 2016-08-26 DIAGNOSIS — Z940 Kidney transplant status: Secondary | ICD-10-CM | POA: Diagnosis not present

## 2016-08-26 DIAGNOSIS — D8989 Other specified disorders involving the immune mechanism, not elsewhere classified: Secondary | ICD-10-CM | POA: Diagnosis not present

## 2016-08-26 DIAGNOSIS — I10 Essential (primary) hypertension: Secondary | ICD-10-CM | POA: Diagnosis not present

## 2016-08-26 DIAGNOSIS — Z4822 Encounter for aftercare following kidney transplant: Secondary | ICD-10-CM | POA: Diagnosis not present

## 2016-08-26 DIAGNOSIS — Z8739 Personal history of other diseases of the musculoskeletal system and connective tissue: Secondary | ICD-10-CM | POA: Diagnosis not present

## 2016-08-26 DIAGNOSIS — E785 Hyperlipidemia, unspecified: Secondary | ICD-10-CM | POA: Diagnosis not present

## 2016-08-26 DIAGNOSIS — R634 Abnormal weight loss: Secondary | ICD-10-CM | POA: Diagnosis not present

## 2016-09-01 ENCOUNTER — Encounter (HOSPITAL_COMMUNITY)
Admission: RE | Admit: 2016-09-01 | Discharge: 2016-09-01 | Disposition: A | Discharge status: Home / Self Care | Payer: Medicare Other | Source: Ambulatory Visit | Attending: Nephrology | Admitting: Nephrology

## 2016-09-01 DIAGNOSIS — N184 Chronic kidney disease, stage 4 (severe): Secondary | ICD-10-CM | POA: Diagnosis not present

## 2016-09-01 DIAGNOSIS — D638 Anemia in other chronic diseases classified elsewhere: Secondary | ICD-10-CM | POA: Diagnosis not present

## 2016-09-01 DIAGNOSIS — D631 Anemia in chronic kidney disease: Secondary | ICD-10-CM

## 2016-09-01 MED ORDER — EPOETIN ALFA 20000 UNIT/ML IJ SOLN
20000.0000 [IU] | INTRAMUSCULAR | Status: DC
  Administered 2016-09-01: 20000 [IU] via SUBCUTANEOUS

## 2016-09-01 MED ORDER — EPOETIN ALFA 20000 UNIT/ML IJ SOLN
INTRAMUSCULAR | Status: AC
  Filled 2016-09-01: qty 1

## 2016-09-02 DIAGNOSIS — Z940 Kidney transplant status: Secondary | ICD-10-CM | POA: Diagnosis not present

## 2016-09-02 LAB — POCT HEMOGLOBIN-HEMACUE: Hemoglobin: 10.6 g/dL — ABNORMAL LOW (ref 12.0–15.0)

## 2016-09-04 DIAGNOSIS — N184 Chronic kidney disease, stage 4 (severe): Secondary | ICD-10-CM | POA: Diagnosis not present

## 2016-09-04 DIAGNOSIS — H1030 Unspecified acute conjunctivitis, unspecified eye: Secondary | ICD-10-CM | POA: Diagnosis not present

## 2016-09-04 DIAGNOSIS — Z6823 Body mass index (BMI) 23.0-23.9, adult: Secondary | ICD-10-CM | POA: Diagnosis not present

## 2016-09-04 DIAGNOSIS — D638 Anemia in other chronic diseases classified elsewhere: Secondary | ICD-10-CM | POA: Diagnosis not present

## 2016-09-04 DIAGNOSIS — J309 Allergic rhinitis, unspecified: Secondary | ICD-10-CM | POA: Diagnosis not present

## 2016-09-04 DIAGNOSIS — E1139 Type 2 diabetes mellitus with other diabetic ophthalmic complication: Secondary | ICD-10-CM | POA: Diagnosis not present

## 2016-09-04 DIAGNOSIS — Z1389 Encounter for screening for other disorder: Secondary | ICD-10-CM | POA: Diagnosis not present

## 2016-09-04 DIAGNOSIS — E042 Nontoxic multinodular goiter: Secondary | ICD-10-CM | POA: Diagnosis not present

## 2016-09-04 DIAGNOSIS — E1129 Type 2 diabetes mellitus with other diabetic kidney complication: Secondary | ICD-10-CM | POA: Diagnosis not present

## 2016-09-04 DIAGNOSIS — I10 Essential (primary) hypertension: Secondary | ICD-10-CM | POA: Diagnosis not present

## 2016-09-04 DIAGNOSIS — Z940 Kidney transplant status: Secondary | ICD-10-CM | POA: Diagnosis not present

## 2016-09-04 DIAGNOSIS — E11319 Type 2 diabetes mellitus with unspecified diabetic retinopathy without macular edema: Secondary | ICD-10-CM | POA: Diagnosis not present

## 2016-09-15 ENCOUNTER — Encounter (HOSPITAL_COMMUNITY): Payer: Medicare Other

## 2016-09-16 DIAGNOSIS — D8989 Other specified disorders involving the immune mechanism, not elsewhere classified: Secondary | ICD-10-CM | POA: Diagnosis not present

## 2016-09-16 DIAGNOSIS — E041 Nontoxic single thyroid nodule: Secondary | ICD-10-CM | POA: Diagnosis not present

## 2016-09-16 DIAGNOSIS — E876 Hypokalemia: Secondary | ICD-10-CM | POA: Diagnosis not present

## 2016-09-16 DIAGNOSIS — E785 Hyperlipidemia, unspecified: Secondary | ICD-10-CM | POA: Diagnosis not present

## 2016-09-16 DIAGNOSIS — R0981 Nasal congestion: Secondary | ICD-10-CM | POA: Diagnosis not present

## 2016-09-16 DIAGNOSIS — R05 Cough: Secondary | ICD-10-CM | POA: Diagnosis not present

## 2016-09-16 DIAGNOSIS — Z940 Kidney transplant status: Secondary | ICD-10-CM | POA: Diagnosis not present

## 2016-09-16 DIAGNOSIS — I10 Essential (primary) hypertension: Secondary | ICD-10-CM | POA: Diagnosis not present

## 2016-09-16 DIAGNOSIS — I517 Cardiomegaly: Secondary | ICD-10-CM | POA: Diagnosis not present

## 2016-09-16 DIAGNOSIS — B259 Cytomegaloviral disease, unspecified: Secondary | ICD-10-CM | POA: Insufficient documentation

## 2016-09-16 DIAGNOSIS — Z794 Long term (current) use of insulin: Secondary | ICD-10-CM | POA: Diagnosis not present

## 2016-09-16 DIAGNOSIS — Z79899 Other long term (current) drug therapy: Secondary | ICD-10-CM | POA: Diagnosis not present

## 2016-09-16 DIAGNOSIS — E139 Other specified diabetes mellitus without complications: Secondary | ICD-10-CM | POA: Diagnosis not present

## 2016-09-17 DIAGNOSIS — E872 Acidosis: Secondary | ICD-10-CM | POA: Diagnosis not present

## 2016-09-17 DIAGNOSIS — Z940 Kidney transplant status: Secondary | ICD-10-CM | POA: Diagnosis not present

## 2016-09-17 DIAGNOSIS — J189 Pneumonia, unspecified organism: Secondary | ICD-10-CM | POA: Insufficient documentation

## 2016-09-17 DIAGNOSIS — R0682 Tachypnea, not elsewhere classified: Secondary | ICD-10-CM | POA: Diagnosis not present

## 2016-09-17 DIAGNOSIS — R05 Cough: Secondary | ICD-10-CM | POA: Diagnosis not present

## 2016-09-17 DIAGNOSIS — J121 Respiratory syncytial virus pneumonia: Secondary | ICD-10-CM | POA: Diagnosis not present

## 2016-09-17 DIAGNOSIS — E1122 Type 2 diabetes mellitus with diabetic chronic kidney disease: Secondary | ICD-10-CM | POA: Diagnosis present

## 2016-09-17 DIAGNOSIS — B258 Other cytomegaloviral diseases: Secondary | ICD-10-CM | POA: Diagnosis not present

## 2016-09-17 DIAGNOSIS — Z79899 Other long term (current) drug therapy: Secondary | ICD-10-CM | POA: Diagnosis not present

## 2016-09-17 DIAGNOSIS — B974 Respiratory syncytial virus as the cause of diseases classified elsewhere: Secondary | ICD-10-CM | POA: Diagnosis present

## 2016-09-17 DIAGNOSIS — D72819 Decreased white blood cell count, unspecified: Secondary | ICD-10-CM | POA: Diagnosis not present

## 2016-09-17 DIAGNOSIS — B9710 Unspecified enterovirus as the cause of diseases classified elsewhere: Secondary | ICD-10-CM | POA: Diagnosis not present

## 2016-09-17 DIAGNOSIS — R918 Other nonspecific abnormal finding of lung field: Secondary | ICD-10-CM | POA: Diagnosis not present

## 2016-09-17 DIAGNOSIS — R0902 Hypoxemia: Secondary | ICD-10-CM | POA: Diagnosis not present

## 2016-09-17 DIAGNOSIS — R680 Hypothermia, not associated with low environmental temperature: Secondary | ICD-10-CM | POA: Diagnosis not present

## 2016-09-17 DIAGNOSIS — Z794 Long term (current) use of insulin: Secondary | ICD-10-CM | POA: Diagnosis not present

## 2016-09-17 DIAGNOSIS — I10 Essential (primary) hypertension: Secondary | ICD-10-CM | POA: Diagnosis not present

## 2016-09-17 DIAGNOSIS — B9719 Other enterovirus as the cause of diseases classified elsewhere: Secondary | ICD-10-CM | POA: Diagnosis not present

## 2016-09-17 DIAGNOSIS — E785 Hyperlipidemia, unspecified: Secondary | ICD-10-CM | POA: Diagnosis present

## 2016-09-17 DIAGNOSIS — E041 Nontoxic single thyroid nodule: Secondary | ICD-10-CM | POA: Diagnosis present

## 2016-09-17 DIAGNOSIS — D72818 Other decreased white blood cell count: Secondary | ICD-10-CM | POA: Diagnosis not present

## 2016-09-17 DIAGNOSIS — D709 Neutropenia, unspecified: Secondary | ICD-10-CM | POA: Diagnosis not present

## 2016-09-17 DIAGNOSIS — M109 Gout, unspecified: Secondary | ICD-10-CM | POA: Diagnosis present

## 2016-09-17 DIAGNOSIS — N186 End stage renal disease: Secondary | ICD-10-CM | POA: Diagnosis not present

## 2016-09-17 DIAGNOSIS — B259 Cytomegaloviral disease, unspecified: Secondary | ICD-10-CM | POA: Diagnosis present

## 2016-09-17 DIAGNOSIS — Z7952 Long term (current) use of systemic steroids: Secondary | ICD-10-CM | POA: Diagnosis not present

## 2016-09-17 DIAGNOSIS — N179 Acute kidney failure, unspecified: Secondary | ICD-10-CM | POA: Diagnosis not present

## 2016-09-17 DIAGNOSIS — J00 Acute nasopharyngitis [common cold]: Secondary | ICD-10-CM | POA: Diagnosis not present

## 2016-09-17 DIAGNOSIS — R0600 Dyspnea, unspecified: Secondary | ICD-10-CM | POA: Diagnosis not present

## 2016-09-17 DIAGNOSIS — Z5181 Encounter for therapeutic drug level monitoring: Secondary | ICD-10-CM | POA: Diagnosis not present

## 2016-09-17 DIAGNOSIS — J069 Acute upper respiratory infection, unspecified: Secondary | ICD-10-CM | POA: Diagnosis present

## 2016-09-17 DIAGNOSIS — B9789 Other viral agents as the cause of diseases classified elsewhere: Secondary | ICD-10-CM | POA: Diagnosis not present

## 2016-09-17 DIAGNOSIS — B250 Cytomegaloviral pneumonitis: Secondary | ICD-10-CM | POA: Diagnosis not present

## 2016-09-17 DIAGNOSIS — K5900 Constipation, unspecified: Secondary | ICD-10-CM | POA: Diagnosis not present

## 2016-09-17 DIAGNOSIS — E876 Hypokalemia: Secondary | ICD-10-CM | POA: Diagnosis not present

## 2016-09-17 DIAGNOSIS — R110 Nausea: Secondary | ICD-10-CM | POA: Diagnosis not present

## 2016-09-17 DIAGNOSIS — I120 Hypertensive chronic kidney disease with stage 5 chronic kidney disease or end stage renal disease: Secondary | ICD-10-CM | POA: Diagnosis present

## 2016-09-17 DIAGNOSIS — R6510 Systemic inflammatory response syndrome (SIRS) of non-infectious origin without acute organ dysfunction: Secondary | ICD-10-CM | POA: Diagnosis not present

## 2016-09-17 DIAGNOSIS — R197 Diarrhea, unspecified: Secondary | ICD-10-CM | POA: Diagnosis not present

## 2016-09-18 DIAGNOSIS — R6510 Systemic inflammatory response syndrome (SIRS) of non-infectious origin without acute organ dysfunction: Secondary | ICD-10-CM | POA: Insufficient documentation

## 2016-09-25 DIAGNOSIS — N186 End stage renal disease: Secondary | ICD-10-CM | POA: Diagnosis not present

## 2016-09-25 DIAGNOSIS — Z940 Kidney transplant status: Secondary | ICD-10-CM | POA: Diagnosis not present

## 2016-09-25 DIAGNOSIS — E876 Hypokalemia: Secondary | ICD-10-CM | POA: Diagnosis not present

## 2016-09-25 DIAGNOSIS — E041 Nontoxic single thyroid nodule: Secondary | ICD-10-CM | POA: Diagnosis not present

## 2016-09-25 DIAGNOSIS — R808 Other proteinuria: Secondary | ICD-10-CM | POA: Diagnosis not present

## 2016-09-25 DIAGNOSIS — I120 Hypertensive chronic kidney disease with stage 5 chronic kidney disease or end stage renal disease: Secondary | ICD-10-CM | POA: Diagnosis not present

## 2016-09-25 DIAGNOSIS — Z794 Long term (current) use of insulin: Secondary | ICD-10-CM | POA: Diagnosis not present

## 2016-09-25 DIAGNOSIS — E1122 Type 2 diabetes mellitus with diabetic chronic kidney disease: Secondary | ICD-10-CM | POA: Diagnosis not present

## 2016-09-25 DIAGNOSIS — B349 Viral infection, unspecified: Secondary | ICD-10-CM | POA: Diagnosis not present

## 2016-09-25 DIAGNOSIS — M109 Gout, unspecified: Secondary | ICD-10-CM | POA: Diagnosis not present

## 2016-09-25 DIAGNOSIS — D8989 Other specified disorders involving the immune mechanism, not elsewhere classified: Secondary | ICD-10-CM | POA: Diagnosis not present

## 2016-09-25 DIAGNOSIS — B259 Cytomegaloviral disease, unspecified: Secondary | ICD-10-CM | POA: Diagnosis not present

## 2016-09-25 DIAGNOSIS — E785 Hyperlipidemia, unspecified: Secondary | ICD-10-CM | POA: Diagnosis not present

## 2016-09-25 DIAGNOSIS — N2581 Secondary hyperparathyroidism of renal origin: Secondary | ICD-10-CM | POA: Diagnosis not present

## 2016-09-25 DIAGNOSIS — D631 Anemia in chronic kidney disease: Secondary | ICD-10-CM | POA: Diagnosis not present

## 2016-09-25 DIAGNOSIS — Z79899 Other long term (current) drug therapy: Secondary | ICD-10-CM | POA: Diagnosis not present

## 2016-09-25 DIAGNOSIS — E138 Other specified diabetes mellitus with unspecified complications: Secondary | ICD-10-CM | POA: Diagnosis not present

## 2016-09-25 DIAGNOSIS — I10 Essential (primary) hypertension: Secondary | ICD-10-CM | POA: Diagnosis not present

## 2016-09-25 DIAGNOSIS — J069 Acute upper respiratory infection, unspecified: Secondary | ICD-10-CM | POA: Diagnosis not present

## 2016-09-25 DIAGNOSIS — Z4822 Encounter for aftercare following kidney transplant: Secondary | ICD-10-CM | POA: Diagnosis not present

## 2016-09-29 ENCOUNTER — Encounter (HOSPITAL_COMMUNITY)
Admission: RE | Admit: 2016-09-29 | Discharge: 2016-09-29 | Disposition: A | Discharge status: Home / Self Care | Payer: Medicare Other | Source: Ambulatory Visit | Attending: Nephrology | Admitting: Nephrology

## 2016-09-29 DIAGNOSIS — N184 Chronic kidney disease, stage 4 (severe): Secondary | ICD-10-CM | POA: Diagnosis not present

## 2016-09-29 DIAGNOSIS — D631 Anemia in chronic kidney disease: Secondary | ICD-10-CM

## 2016-09-29 DIAGNOSIS — D638 Anemia in other chronic diseases classified elsewhere: Secondary | ICD-10-CM | POA: Diagnosis not present

## 2016-09-29 LAB — POCT HEMOGLOBIN-HEMACUE: Hemoglobin: 8.8 g/dL — ABNORMAL LOW (ref 12.0–15.0)

## 2016-09-29 MED ORDER — EPOETIN ALFA 20000 UNIT/ML IJ SOLN
INTRAMUSCULAR | Status: AC
  Filled 2016-09-29: qty 1

## 2016-09-29 MED ORDER — EPOETIN ALFA 20000 UNIT/ML IJ SOLN
20000.0000 [IU] | INTRAMUSCULAR | Status: DC
  Administered 2016-09-29: 13:00:00 20000 [IU] via SUBCUTANEOUS

## 2016-09-30 DIAGNOSIS — E042 Nontoxic multinodular goiter: Secondary | ICD-10-CM | POA: Diagnosis not present

## 2016-10-01 ENCOUNTER — Other Ambulatory Visit: Payer: Self-pay | Admitting: Surgery

## 2016-10-01 DIAGNOSIS — A630 Anogenital (venereal) warts: Secondary | ICD-10-CM | POA: Diagnosis not present

## 2016-10-01 DIAGNOSIS — E042 Nontoxic multinodular goiter: Secondary | ICD-10-CM

## 2016-10-02 DIAGNOSIS — E042 Nontoxic multinodular goiter: Secondary | ICD-10-CM | POA: Diagnosis not present

## 2016-10-02 DIAGNOSIS — N186 End stage renal disease: Secondary | ICD-10-CM | POA: Diagnosis not present

## 2016-10-02 DIAGNOSIS — Z7952 Long term (current) use of systemic steroids: Secondary | ICD-10-CM | POA: Diagnosis not present

## 2016-10-02 DIAGNOSIS — Z940 Kidney transplant status: Secondary | ICD-10-CM | POA: Diagnosis not present

## 2016-10-02 DIAGNOSIS — D72819 Decreased white blood cell count, unspecified: Secondary | ICD-10-CM | POA: Diagnosis not present

## 2016-10-02 DIAGNOSIS — Z4822 Encounter for aftercare following kidney transplant: Secondary | ICD-10-CM | POA: Diagnosis not present

## 2016-10-02 DIAGNOSIS — B259 Cytomegaloviral disease, unspecified: Secondary | ICD-10-CM | POA: Diagnosis not present

## 2016-10-02 DIAGNOSIS — R05 Cough: Secondary | ICD-10-CM | POA: Diagnosis not present

## 2016-10-02 DIAGNOSIS — R601 Generalized edema: Secondary | ICD-10-CM | POA: Diagnosis not present

## 2016-10-02 DIAGNOSIS — E785 Hyperlipidemia, unspecified: Secondary | ICD-10-CM | POA: Diagnosis not present

## 2016-10-02 DIAGNOSIS — Z79899 Other long term (current) drug therapy: Secondary | ICD-10-CM | POA: Diagnosis not present

## 2016-10-02 DIAGNOSIS — I120 Hypertensive chronic kidney disease with stage 5 chronic kidney disease or end stage renal disease: Secondary | ICD-10-CM | POA: Diagnosis not present

## 2016-10-02 DIAGNOSIS — E876 Hypokalemia: Secondary | ICD-10-CM | POA: Diagnosis not present

## 2016-10-02 DIAGNOSIS — E139 Other specified diabetes mellitus without complications: Secondary | ICD-10-CM | POA: Diagnosis not present

## 2016-10-02 DIAGNOSIS — Z794 Long term (current) use of insulin: Secondary | ICD-10-CM | POA: Diagnosis not present

## 2016-10-02 DIAGNOSIS — D473 Essential (hemorrhagic) thrombocythemia: Secondary | ICD-10-CM | POA: Diagnosis not present

## 2016-10-02 DIAGNOSIS — I10 Essential (primary) hypertension: Secondary | ICD-10-CM | POA: Diagnosis not present

## 2016-10-02 DIAGNOSIS — M109 Gout, unspecified: Secondary | ICD-10-CM | POA: Diagnosis not present

## 2016-10-02 DIAGNOSIS — D8989 Other specified disorders involving the immune mechanism, not elsewhere classified: Secondary | ICD-10-CM | POA: Diagnosis not present

## 2016-10-06 DIAGNOSIS — I120 Hypertensive chronic kidney disease with stage 5 chronic kidney disease or end stage renal disease: Secondary | ICD-10-CM | POA: Diagnosis not present

## 2016-10-06 DIAGNOSIS — N186 End stage renal disease: Secondary | ICD-10-CM | POA: Diagnosis not present

## 2016-10-06 DIAGNOSIS — D8989 Other specified disorders involving the immune mechanism, not elsewhere classified: Secondary | ICD-10-CM | POA: Diagnosis not present

## 2016-10-06 DIAGNOSIS — N2581 Secondary hyperparathyroidism of renal origin: Secondary | ICD-10-CM | POA: Diagnosis not present

## 2016-10-06 DIAGNOSIS — E876 Hypokalemia: Secondary | ICD-10-CM | POA: Diagnosis not present

## 2016-10-06 DIAGNOSIS — R600 Localized edema: Secondary | ICD-10-CM | POA: Diagnosis not present

## 2016-10-06 DIAGNOSIS — I10 Essential (primary) hypertension: Secondary | ICD-10-CM | POA: Diagnosis not present

## 2016-10-06 DIAGNOSIS — Z794 Long term (current) use of insulin: Secondary | ICD-10-CM | POA: Diagnosis not present

## 2016-10-06 DIAGNOSIS — D72819 Decreased white blood cell count, unspecified: Secondary | ICD-10-CM | POA: Diagnosis not present

## 2016-10-06 DIAGNOSIS — R609 Edema, unspecified: Secondary | ICD-10-CM | POA: Diagnosis not present

## 2016-10-06 DIAGNOSIS — D899 Disorder involving the immune mechanism, unspecified: Secondary | ICD-10-CM | POA: Diagnosis not present

## 2016-10-06 DIAGNOSIS — M109 Gout, unspecified: Secondary | ICD-10-CM | POA: Diagnosis not present

## 2016-10-06 DIAGNOSIS — Z4822 Encounter for aftercare following kidney transplant: Secondary | ICD-10-CM | POA: Diagnosis not present

## 2016-10-06 DIAGNOSIS — E042 Nontoxic multinodular goiter: Secondary | ICD-10-CM | POA: Diagnosis not present

## 2016-10-06 DIAGNOSIS — E041 Nontoxic single thyroid nodule: Secondary | ICD-10-CM | POA: Diagnosis not present

## 2016-10-06 DIAGNOSIS — B259 Cytomegaloviral disease, unspecified: Secondary | ICD-10-CM | POA: Diagnosis not present

## 2016-10-06 DIAGNOSIS — E139 Other specified diabetes mellitus without complications: Secondary | ICD-10-CM | POA: Diagnosis not present

## 2016-10-06 DIAGNOSIS — R05 Cough: Secondary | ICD-10-CM | POA: Diagnosis not present

## 2016-10-06 DIAGNOSIS — Z940 Kidney transplant status: Secondary | ICD-10-CM | POA: Diagnosis not present

## 2016-10-06 DIAGNOSIS — E1122 Type 2 diabetes mellitus with diabetic chronic kidney disease: Secondary | ICD-10-CM | POA: Diagnosis not present

## 2016-10-06 DIAGNOSIS — E785 Hyperlipidemia, unspecified: Secondary | ICD-10-CM | POA: Diagnosis not present

## 2016-10-06 DIAGNOSIS — Z79899 Other long term (current) drug therapy: Secondary | ICD-10-CM | POA: Diagnosis not present

## 2016-10-06 DIAGNOSIS — J069 Acute upper respiratory infection, unspecified: Secondary | ICD-10-CM | POA: Diagnosis not present

## 2016-10-07 ENCOUNTER — Other Ambulatory Visit (HOSPITAL_COMMUNITY)
Admission: RE | Admit: 2016-10-07 | Discharge: 2016-10-07 | Disposition: A | Discharge status: Home / Self Care | Payer: Medicare Other | Source: Ambulatory Visit | Attending: Radiology | Admitting: Radiology

## 2016-10-07 ENCOUNTER — Ambulatory Visit
Admission: RE | Admit: 2016-10-07 | Discharge: 2016-10-07 | Disposition: A | Discharge status: Home / Self Care | Payer: Medicare Other | Source: Ambulatory Visit | Attending: Surgery | Admitting: Surgery

## 2016-10-07 DIAGNOSIS — E041 Nontoxic single thyroid nodule: Secondary | ICD-10-CM | POA: Diagnosis not present

## 2016-10-07 DIAGNOSIS — E042 Nontoxic multinodular goiter: Secondary | ICD-10-CM

## 2016-10-13 ENCOUNTER — Encounter (HOSPITAL_COMMUNITY)
Admission: RE | Admit: 2016-10-13 | Discharge: 2016-10-13 | Disposition: A | Discharge status: Home / Self Care | Payer: Medicare Other | Source: Ambulatory Visit | Attending: Nephrology | Admitting: Nephrology

## 2016-10-13 DIAGNOSIS — N184 Chronic kidney disease, stage 4 (severe): Secondary | ICD-10-CM | POA: Diagnosis not present

## 2016-10-13 DIAGNOSIS — D631 Anemia in chronic kidney disease: Secondary | ICD-10-CM

## 2016-10-13 DIAGNOSIS — D638 Anemia in other chronic diseases classified elsewhere: Secondary | ICD-10-CM | POA: Diagnosis not present

## 2016-10-13 LAB — IRON AND TIBC
Iron: 75 ug/dL (ref 28–170)
Saturation Ratios: 29 % (ref 10.4–31.8)
TIBC: 260 ug/dL (ref 250–450)
UIBC: 185 ug/dL

## 2016-10-13 LAB — FERRITIN: FERRITIN: 265 ng/mL (ref 11–307)

## 2016-10-13 LAB — POCT HEMOGLOBIN-HEMACUE: HEMOGLOBIN: 10.4 g/dL — AB (ref 12.0–15.0)

## 2016-10-13 MED ORDER — EPOETIN ALFA 20000 UNIT/ML IJ SOLN
INTRAMUSCULAR | Status: AC
  Administered 2016-10-13: 20000 [IU] via SUBCUTANEOUS
  Filled 2016-10-13: qty 1

## 2016-10-13 MED ORDER — EPOETIN ALFA 20000 UNIT/ML IJ SOLN
20000.0000 [IU] | INTRAMUSCULAR | Status: DC
  Administered 2016-10-13: 20000 [IU] via SUBCUTANEOUS

## 2016-10-15 DIAGNOSIS — Z940 Kidney transplant status: Secondary | ICD-10-CM | POA: Diagnosis not present

## 2016-10-15 DIAGNOSIS — D649 Anemia, unspecified: Secondary | ICD-10-CM | POA: Diagnosis not present

## 2016-10-15 DIAGNOSIS — M109 Gout, unspecified: Secondary | ICD-10-CM | POA: Diagnosis not present

## 2016-10-15 DIAGNOSIS — B259 Cytomegaloviral disease, unspecified: Secondary | ICD-10-CM | POA: Diagnosis not present

## 2016-10-15 DIAGNOSIS — E138 Other specified diabetes mellitus with unspecified complications: Secondary | ICD-10-CM | POA: Diagnosis not present

## 2016-10-15 DIAGNOSIS — E042 Nontoxic multinodular goiter: Secondary | ICD-10-CM | POA: Diagnosis not present

## 2016-10-15 DIAGNOSIS — E785 Hyperlipidemia, unspecified: Secondary | ICD-10-CM | POA: Diagnosis not present

## 2016-10-15 DIAGNOSIS — Z794 Long term (current) use of insulin: Secondary | ICD-10-CM | POA: Diagnosis not present

## 2016-10-15 DIAGNOSIS — I120 Hypertensive chronic kidney disease with stage 5 chronic kidney disease or end stage renal disease: Secondary | ICD-10-CM | POA: Diagnosis not present

## 2016-10-15 DIAGNOSIS — N186 End stage renal disease: Secondary | ICD-10-CM | POA: Diagnosis not present

## 2016-10-15 DIAGNOSIS — D8989 Other specified disorders involving the immune mechanism, not elsewhere classified: Secondary | ICD-10-CM | POA: Diagnosis not present

## 2016-10-15 DIAGNOSIS — Z4822 Encounter for aftercare following kidney transplant: Secondary | ICD-10-CM | POA: Diagnosis not present

## 2016-10-15 DIAGNOSIS — D899 Disorder involving the immune mechanism, unspecified: Secondary | ICD-10-CM | POA: Diagnosis not present

## 2016-10-15 DIAGNOSIS — I10 Essential (primary) hypertension: Secondary | ICD-10-CM | POA: Diagnosis not present

## 2016-10-15 DIAGNOSIS — D631 Anemia in chronic kidney disease: Secondary | ICD-10-CM | POA: Diagnosis not present

## 2016-10-15 DIAGNOSIS — E1122 Type 2 diabetes mellitus with diabetic chronic kidney disease: Secondary | ICD-10-CM | POA: Diagnosis not present

## 2016-10-15 DIAGNOSIS — Z79899 Other long term (current) drug therapy: Secondary | ICD-10-CM | POA: Diagnosis not present

## 2016-10-15 DIAGNOSIS — Z7982 Long term (current) use of aspirin: Secondary | ICD-10-CM | POA: Diagnosis not present

## 2016-10-15 DIAGNOSIS — D72819 Decreased white blood cell count, unspecified: Secondary | ICD-10-CM | POA: Diagnosis not present

## 2016-10-15 DIAGNOSIS — E876 Hypokalemia: Secondary | ICD-10-CM | POA: Diagnosis not present

## 2016-10-15 DIAGNOSIS — E041 Nontoxic single thyroid nodule: Secondary | ICD-10-CM | POA: Diagnosis not present

## 2016-10-22 DIAGNOSIS — Z940 Kidney transplant status: Secondary | ICD-10-CM | POA: Diagnosis not present

## 2016-10-27 ENCOUNTER — Encounter (HOSPITAL_COMMUNITY)
Admission: RE | Admit: 2016-10-27 | Discharge: 2016-10-27 | Disposition: A | Discharge status: Home / Self Care | Payer: Medicare Other | Source: Ambulatory Visit | Attending: Nephrology | Admitting: Nephrology

## 2016-10-27 DIAGNOSIS — N184 Chronic kidney disease, stage 4 (severe): Secondary | ICD-10-CM | POA: Diagnosis not present

## 2016-10-27 DIAGNOSIS — D638 Anemia in other chronic diseases classified elsewhere: Secondary | ICD-10-CM | POA: Insufficient documentation

## 2016-10-27 DIAGNOSIS — D631 Anemia in chronic kidney disease: Secondary | ICD-10-CM

## 2016-10-27 MED ORDER — EPOETIN ALFA 20000 UNIT/ML IJ SOLN
INTRAMUSCULAR | Status: AC
  Administered 2016-10-27: 13:00:00 20000 [IU] via SUBCUTANEOUS
  Filled 2016-10-27: qty 1

## 2016-10-27 MED ORDER — EPOETIN ALFA 20000 UNIT/ML IJ SOLN
20000.0000 [IU] | INTRAMUSCULAR | Status: DC
  Administered 2016-10-27: 20000 [IU] via SUBCUTANEOUS

## 2016-10-28 LAB — POCT HEMOGLOBIN-HEMACUE: HEMOGLOBIN: 10.9 g/dL — AB (ref 12.0–15.0)

## 2016-10-29 DIAGNOSIS — D649 Anemia, unspecified: Secondary | ICD-10-CM | POA: Diagnosis not present

## 2016-10-29 DIAGNOSIS — D696 Thrombocytopenia, unspecified: Secondary | ICD-10-CM | POA: Diagnosis not present

## 2016-10-29 DIAGNOSIS — E138 Other specified diabetes mellitus with unspecified complications: Secondary | ICD-10-CM | POA: Diagnosis not present

## 2016-10-29 DIAGNOSIS — N186 End stage renal disease: Secondary | ICD-10-CM | POA: Diagnosis not present

## 2016-10-29 DIAGNOSIS — Z7982 Long term (current) use of aspirin: Secondary | ICD-10-CM | POA: Diagnosis not present

## 2016-10-29 DIAGNOSIS — R634 Abnormal weight loss: Secondary | ICD-10-CM | POA: Diagnosis not present

## 2016-10-29 DIAGNOSIS — E041 Nontoxic single thyroid nodule: Secondary | ICD-10-CM | POA: Diagnosis not present

## 2016-10-29 DIAGNOSIS — E042 Nontoxic multinodular goiter: Secondary | ICD-10-CM | POA: Diagnosis not present

## 2016-10-29 DIAGNOSIS — Z79899 Other long term (current) drug therapy: Secondary | ICD-10-CM | POA: Diagnosis not present

## 2016-10-29 DIAGNOSIS — D8989 Other specified disorders involving the immune mechanism, not elsewhere classified: Secondary | ICD-10-CM | POA: Diagnosis not present

## 2016-10-29 DIAGNOSIS — E876 Hypokalemia: Secondary | ICD-10-CM | POA: Diagnosis not present

## 2016-10-29 DIAGNOSIS — E785 Hyperlipidemia, unspecified: Secondary | ICD-10-CM | POA: Diagnosis not present

## 2016-10-29 DIAGNOSIS — I10 Essential (primary) hypertension: Secondary | ICD-10-CM | POA: Diagnosis not present

## 2016-10-29 DIAGNOSIS — B259 Cytomegaloviral disease, unspecified: Secondary | ICD-10-CM | POA: Diagnosis not present

## 2016-10-29 DIAGNOSIS — Z4822 Encounter for aftercare following kidney transplant: Secondary | ICD-10-CM | POA: Diagnosis not present

## 2016-10-29 DIAGNOSIS — D899 Disorder involving the immune mechanism, unspecified: Secondary | ICD-10-CM | POA: Diagnosis not present

## 2016-10-29 DIAGNOSIS — Z940 Kidney transplant status: Secondary | ICD-10-CM | POA: Diagnosis not present

## 2016-10-29 DIAGNOSIS — I120 Hypertensive chronic kidney disease with stage 5 chronic kidney disease or end stage renal disease: Secondary | ICD-10-CM | POA: Diagnosis not present

## 2016-10-29 DIAGNOSIS — E1122 Type 2 diabetes mellitus with diabetic chronic kidney disease: Secondary | ICD-10-CM | POA: Diagnosis not present

## 2016-10-29 DIAGNOSIS — M109 Gout, unspecified: Secondary | ICD-10-CM | POA: Diagnosis not present

## 2016-10-29 DIAGNOSIS — D72819 Decreased white blood cell count, unspecified: Secondary | ICD-10-CM | POA: Diagnosis not present

## 2016-10-29 DIAGNOSIS — Z794 Long term (current) use of insulin: Secondary | ICD-10-CM | POA: Diagnosis not present

## 2016-11-07 DIAGNOSIS — Z940 Kidney transplant status: Secondary | ICD-10-CM | POA: Diagnosis not present

## 2016-11-07 DIAGNOSIS — I10 Essential (primary) hypertension: Secondary | ICD-10-CM | POA: Diagnosis not present

## 2016-11-07 DIAGNOSIS — N390 Urinary tract infection, site not specified: Secondary | ICD-10-CM | POA: Diagnosis not present

## 2016-11-07 DIAGNOSIS — R8299 Other abnormal findings in urine: Secondary | ICD-10-CM | POA: Diagnosis not present

## 2016-11-07 DIAGNOSIS — D638 Anemia in other chronic diseases classified elsewhere: Secondary | ICD-10-CM | POA: Diagnosis not present

## 2016-11-10 ENCOUNTER — Encounter (HOSPITAL_COMMUNITY)
Admission: RE | Admit: 2016-11-10 | Discharge: 2016-11-10 | Disposition: A | Discharge status: Home / Self Care | Payer: Medicare Other | Source: Ambulatory Visit | Attending: Nephrology | Admitting: Nephrology

## 2016-11-10 DIAGNOSIS — D638 Anemia in other chronic diseases classified elsewhere: Secondary | ICD-10-CM | POA: Diagnosis not present

## 2016-11-10 DIAGNOSIS — N184 Chronic kidney disease, stage 4 (severe): Principal | ICD-10-CM

## 2016-11-10 DIAGNOSIS — D631 Anemia in chronic kidney disease: Secondary | ICD-10-CM

## 2016-11-10 LAB — POCT HEMOGLOBIN-HEMACUE: Hemoglobin: 11.1 g/dL — ABNORMAL LOW (ref 12.0–15.0)

## 2016-11-10 MED ORDER — EPOETIN ALFA 20000 UNIT/ML IJ SOLN
20000.0000 [IU] | INTRAMUSCULAR | Status: DC
  Administered 2016-11-10: 20000 [IU] via SUBCUTANEOUS

## 2016-11-10 MED ORDER — EPOETIN ALFA 20000 UNIT/ML IJ SOLN
INTRAMUSCULAR | Status: DC
Start: 2016-11-10 — End: 2016-11-11
  Filled 2016-11-10: qty 1

## 2016-11-12 DIAGNOSIS — D899 Disorder involving the immune mechanism, unspecified: Secondary | ICD-10-CM | POA: Diagnosis not present

## 2016-11-12 DIAGNOSIS — E876 Hypokalemia: Secondary | ICD-10-CM | POA: Diagnosis not present

## 2016-11-12 DIAGNOSIS — Z7982 Long term (current) use of aspirin: Secondary | ICD-10-CM | POA: Diagnosis not present

## 2016-11-12 DIAGNOSIS — D649 Anemia, unspecified: Secondary | ICD-10-CM | POA: Diagnosis not present

## 2016-11-12 DIAGNOSIS — D72819 Decreased white blood cell count, unspecified: Secondary | ICD-10-CM | POA: Diagnosis not present

## 2016-11-12 DIAGNOSIS — R634 Abnormal weight loss: Secondary | ICD-10-CM | POA: Diagnosis not present

## 2016-11-12 DIAGNOSIS — Z79899 Other long term (current) drug therapy: Secondary | ICD-10-CM | POA: Diagnosis not present

## 2016-11-12 DIAGNOSIS — Z7952 Long term (current) use of systemic steroids: Secondary | ICD-10-CM | POA: Diagnosis not present

## 2016-11-12 DIAGNOSIS — E139 Other specified diabetes mellitus without complications: Secondary | ICD-10-CM | POA: Diagnosis not present

## 2016-11-12 DIAGNOSIS — Z992 Dependence on renal dialysis: Secondary | ICD-10-CM | POA: Diagnosis not present

## 2016-11-12 DIAGNOSIS — D631 Anemia in chronic kidney disease: Secondary | ICD-10-CM | POA: Diagnosis not present

## 2016-11-12 DIAGNOSIS — R609 Edema, unspecified: Secondary | ICD-10-CM | POA: Diagnosis not present

## 2016-11-12 DIAGNOSIS — Z794 Long term (current) use of insulin: Secondary | ICD-10-CM | POA: Diagnosis not present

## 2016-11-12 DIAGNOSIS — Z940 Kidney transplant status: Secondary | ICD-10-CM | POA: Diagnosis not present

## 2016-11-12 DIAGNOSIS — E042 Nontoxic multinodular goiter: Secondary | ICD-10-CM | POA: Diagnosis not present

## 2016-11-12 DIAGNOSIS — E1122 Type 2 diabetes mellitus with diabetic chronic kidney disease: Secondary | ICD-10-CM | POA: Diagnosis not present

## 2016-11-12 DIAGNOSIS — Z4822 Encounter for aftercare following kidney transplant: Secondary | ICD-10-CM | POA: Diagnosis not present

## 2016-11-12 DIAGNOSIS — E785 Hyperlipidemia, unspecified: Secondary | ICD-10-CM | POA: Diagnosis not present

## 2016-11-12 DIAGNOSIS — J189 Pneumonia, unspecified organism: Secondary | ICD-10-CM | POA: Diagnosis not present

## 2016-11-12 DIAGNOSIS — D8989 Other specified disorders involving the immune mechanism, not elsewhere classified: Secondary | ICD-10-CM | POA: Diagnosis not present

## 2016-11-12 DIAGNOSIS — I120 Hypertensive chronic kidney disease with stage 5 chronic kidney disease or end stage renal disease: Secondary | ICD-10-CM | POA: Diagnosis not present

## 2016-11-12 DIAGNOSIS — M109 Gout, unspecified: Secondary | ICD-10-CM | POA: Diagnosis not present

## 2016-11-12 DIAGNOSIS — J069 Acute upper respiratory infection, unspecified: Secondary | ICD-10-CM | POA: Diagnosis not present

## 2016-11-12 DIAGNOSIS — D696 Thrombocytopenia, unspecified: Secondary | ICD-10-CM | POA: Diagnosis not present

## 2016-11-12 DIAGNOSIS — B259 Cytomegaloviral disease, unspecified: Secondary | ICD-10-CM | POA: Diagnosis not present

## 2016-11-12 DIAGNOSIS — N186 End stage renal disease: Secondary | ICD-10-CM | POA: Diagnosis not present

## 2016-11-12 DIAGNOSIS — Z6824 Body mass index (BMI) 24.0-24.9, adult: Secondary | ICD-10-CM | POA: Diagnosis not present

## 2016-11-12 DIAGNOSIS — I10 Essential (primary) hypertension: Secondary | ICD-10-CM | POA: Diagnosis not present

## 2016-11-19 DIAGNOSIS — K529 Noninfective gastroenteritis and colitis, unspecified: Secondary | ICD-10-CM | POA: Diagnosis not present

## 2016-11-19 DIAGNOSIS — Z940 Kidney transplant status: Secondary | ICD-10-CM | POA: Diagnosis not present

## 2016-11-19 DIAGNOSIS — E876 Hypokalemia: Secondary | ICD-10-CM | POA: Diagnosis not present

## 2016-11-19 DIAGNOSIS — E119 Type 2 diabetes mellitus without complications: Secondary | ICD-10-CM | POA: Diagnosis not present

## 2016-11-19 DIAGNOSIS — N189 Chronic kidney disease, unspecified: Secondary | ICD-10-CM | POA: Diagnosis not present

## 2016-11-19 DIAGNOSIS — I129 Hypertensive chronic kidney disease with stage 1 through stage 4 chronic kidney disease, or unspecified chronic kidney disease: Secondary | ICD-10-CM | POA: Diagnosis not present

## 2016-11-19 DIAGNOSIS — D631 Anemia in chronic kidney disease: Secondary | ICD-10-CM | POA: Diagnosis not present

## 2016-11-19 DIAGNOSIS — E785 Hyperlipidemia, unspecified: Secondary | ICD-10-CM | POA: Diagnosis not present

## 2016-11-19 DIAGNOSIS — D899 Disorder involving the immune mechanism, unspecified: Secondary | ICD-10-CM | POA: Diagnosis not present

## 2016-11-19 DIAGNOSIS — Z9189 Other specified personal risk factors, not elsewhere classified: Secondary | ICD-10-CM | POA: Diagnosis not present

## 2016-11-19 DIAGNOSIS — Z794 Long term (current) use of insulin: Secondary | ICD-10-CM | POA: Diagnosis not present

## 2016-11-19 DIAGNOSIS — E0590 Thyrotoxicosis, unspecified without thyrotoxic crisis or storm: Secondary | ICD-10-CM | POA: Diagnosis not present

## 2016-11-24 ENCOUNTER — Encounter (HOSPITAL_COMMUNITY)
Admission: RE | Admit: 2016-11-24 | Discharge: 2016-11-24 | Disposition: A | Discharge status: Home / Self Care | Payer: Medicare Other | Source: Ambulatory Visit | Attending: Nephrology | Admitting: Nephrology

## 2016-11-24 DIAGNOSIS — N184 Chronic kidney disease, stage 4 (severe): Secondary | ICD-10-CM | POA: Insufficient documentation

## 2016-11-24 DIAGNOSIS — D638 Anemia in other chronic diseases classified elsewhere: Secondary | ICD-10-CM | POA: Insufficient documentation

## 2016-11-24 DIAGNOSIS — D631 Anemia in chronic kidney disease: Secondary | ICD-10-CM

## 2016-11-24 LAB — IRON AND TIBC
IRON: 79 ug/dL (ref 28–170)
Saturation Ratios: 34 % — ABNORMAL HIGH (ref 10.4–31.8)
TIBC: 234 ug/dL — AB (ref 250–450)
UIBC: 155 ug/dL

## 2016-11-24 LAB — POCT HEMOGLOBIN-HEMACUE: Hemoglobin: 11.5 g/dL — ABNORMAL LOW (ref 12.0–15.0)

## 2016-11-24 LAB — FERRITIN: FERRITIN: 89 ng/mL (ref 11–307)

## 2016-11-24 MED ORDER — EPOETIN ALFA 20000 UNIT/ML IJ SOLN
20000.0000 [IU] | INTRAMUSCULAR | Status: DC
  Administered 2016-11-24: 13:00:00 20000 [IU] via SUBCUTANEOUS

## 2016-11-24 MED ORDER — EPOETIN ALFA 20000 UNIT/ML IJ SOLN
INTRAMUSCULAR | Status: AC
  Filled 2016-11-24: qty 1

## 2016-11-26 DIAGNOSIS — Z79899 Other long term (current) drug therapy: Secondary | ICD-10-CM | POA: Diagnosis not present

## 2016-11-26 DIAGNOSIS — Z5189 Encounter for other specified aftercare: Secondary | ICD-10-CM | POA: Diagnosis not present

## 2016-11-26 DIAGNOSIS — B258 Other cytomegaloviral diseases: Secondary | ICD-10-CM | POA: Diagnosis not present

## 2016-11-26 DIAGNOSIS — Z4822 Encounter for aftercare following kidney transplant: Secondary | ICD-10-CM | POA: Diagnosis not present

## 2016-11-26 DIAGNOSIS — D899 Disorder involving the immune mechanism, unspecified: Secondary | ICD-10-CM | POA: Diagnosis not present

## 2016-11-26 DIAGNOSIS — B349 Viral infection, unspecified: Secondary | ICD-10-CM | POA: Diagnosis not present

## 2016-11-28 DIAGNOSIS — R87612 Low grade squamous intraepithelial lesion on cytologic smear of cervix (LGSIL): Secondary | ICD-10-CM | POA: Diagnosis not present

## 2016-11-28 DIAGNOSIS — Z1231 Encounter for screening mammogram for malignant neoplasm of breast: Secondary | ICD-10-CM | POA: Diagnosis not present

## 2016-11-28 DIAGNOSIS — Z01419 Encounter for gynecological examination (general) (routine) without abnormal findings: Secondary | ICD-10-CM | POA: Diagnosis not present

## 2016-11-28 DIAGNOSIS — Z940 Kidney transplant status: Secondary | ICD-10-CM | POA: Diagnosis not present

## 2016-11-28 DIAGNOSIS — Z6824 Body mass index (BMI) 24.0-24.9, adult: Secondary | ICD-10-CM | POA: Diagnosis not present

## 2016-12-01 DIAGNOSIS — Z79899 Other long term (current) drug therapy: Secondary | ICD-10-CM | POA: Diagnosis not present

## 2016-12-01 DIAGNOSIS — Z940 Kidney transplant status: Secondary | ICD-10-CM | POA: Diagnosis not present

## 2016-12-01 DIAGNOSIS — D899 Disorder involving the immune mechanism, unspecified: Secondary | ICD-10-CM | POA: Diagnosis not present

## 2016-12-01 DIAGNOSIS — Z5189 Encounter for other specified aftercare: Secondary | ICD-10-CM | POA: Diagnosis not present

## 2016-12-01 DIAGNOSIS — Z4822 Encounter for aftercare following kidney transplant: Secondary | ICD-10-CM | POA: Diagnosis not present

## 2016-12-03 DIAGNOSIS — Z940 Kidney transplant status: Secondary | ICD-10-CM | POA: Diagnosis not present

## 2016-12-10 DIAGNOSIS — Z794 Long term (current) use of insulin: Secondary | ICD-10-CM | POA: Diagnosis not present

## 2016-12-10 DIAGNOSIS — Z79899 Other long term (current) drug therapy: Secondary | ICD-10-CM | POA: Diagnosis not present

## 2016-12-10 DIAGNOSIS — N186 End stage renal disease: Secondary | ICD-10-CM | POA: Diagnosis not present

## 2016-12-10 DIAGNOSIS — D72819 Decreased white blood cell count, unspecified: Secondary | ICD-10-CM | POA: Diagnosis not present

## 2016-12-10 DIAGNOSIS — I120 Hypertensive chronic kidney disease with stage 5 chronic kidney disease or end stage renal disease: Secondary | ICD-10-CM | POA: Diagnosis not present

## 2016-12-10 DIAGNOSIS — Z4822 Encounter for aftercare following kidney transplant: Secondary | ICD-10-CM | POA: Diagnosis not present

## 2016-12-10 DIAGNOSIS — D709 Neutropenia, unspecified: Secondary | ICD-10-CM | POA: Diagnosis not present

## 2016-12-10 DIAGNOSIS — D8989 Other specified disorders involving the immune mechanism, not elsewhere classified: Secondary | ICD-10-CM | POA: Diagnosis not present

## 2016-12-10 DIAGNOSIS — I10 Essential (primary) hypertension: Secondary | ICD-10-CM | POA: Diagnosis not present

## 2016-12-10 DIAGNOSIS — E138 Other specified diabetes mellitus with unspecified complications: Secondary | ICD-10-CM | POA: Diagnosis not present

## 2016-12-10 DIAGNOSIS — E876 Hypokalemia: Secondary | ICD-10-CM | POA: Diagnosis not present

## 2016-12-10 DIAGNOSIS — D696 Thrombocytopenia, unspecified: Secondary | ICD-10-CM | POA: Diagnosis not present

## 2016-12-10 DIAGNOSIS — Z7982 Long term (current) use of aspirin: Secondary | ICD-10-CM | POA: Diagnosis not present

## 2016-12-10 DIAGNOSIS — D649 Anemia, unspecified: Secondary | ICD-10-CM | POA: Diagnosis not present

## 2016-12-10 DIAGNOSIS — E042 Nontoxic multinodular goiter: Secondary | ICD-10-CM | POA: Diagnosis not present

## 2016-12-10 DIAGNOSIS — B259 Cytomegaloviral disease, unspecified: Secondary | ICD-10-CM | POA: Diagnosis not present

## 2016-12-10 DIAGNOSIS — M109 Gout, unspecified: Secondary | ICD-10-CM | POA: Diagnosis not present

## 2016-12-10 DIAGNOSIS — E1122 Type 2 diabetes mellitus with diabetic chronic kidney disease: Secondary | ICD-10-CM | POA: Diagnosis not present

## 2016-12-10 DIAGNOSIS — E041 Nontoxic single thyroid nodule: Secondary | ICD-10-CM | POA: Diagnosis not present

## 2016-12-10 DIAGNOSIS — Z940 Kidney transplant status: Secondary | ICD-10-CM | POA: Diagnosis not present

## 2016-12-10 DIAGNOSIS — D899 Disorder involving the immune mechanism, unspecified: Secondary | ICD-10-CM | POA: Diagnosis not present

## 2016-12-10 DIAGNOSIS — E785 Hyperlipidemia, unspecified: Secondary | ICD-10-CM | POA: Diagnosis not present

## 2016-12-16 DIAGNOSIS — H1133 Conjunctival hemorrhage, bilateral: Secondary | ICD-10-CM | POA: Diagnosis not present

## 2016-12-17 DIAGNOSIS — D899 Disorder involving the immune mechanism, unspecified: Secondary | ICD-10-CM | POA: Diagnosis not present

## 2016-12-17 DIAGNOSIS — Z940 Kidney transplant status: Secondary | ICD-10-CM | POA: Diagnosis not present

## 2016-12-22 ENCOUNTER — Encounter (HOSPITAL_COMMUNITY)
Admission: RE | Admit: 2016-12-22 | Discharge: 2016-12-22 | Disposition: A | Discharge status: Home / Self Care | Payer: Medicare Other | Source: Ambulatory Visit | Attending: Nephrology | Admitting: Nephrology

## 2016-12-22 DIAGNOSIS — N184 Chronic kidney disease, stage 4 (severe): Secondary | ICD-10-CM | POA: Diagnosis not present

## 2016-12-22 DIAGNOSIS — D638 Anemia in other chronic diseases classified elsewhere: Secondary | ICD-10-CM | POA: Insufficient documentation

## 2016-12-22 DIAGNOSIS — D631 Anemia in chronic kidney disease: Secondary | ICD-10-CM

## 2016-12-22 LAB — IRON AND TIBC
IRON: 80 ug/dL (ref 28–170)
SATURATION RATIOS: 40 % — AB (ref 10.4–31.8)
TIBC: 199 ug/dL — AB (ref 250–450)
UIBC: 119 ug/dL

## 2016-12-22 LAB — FERRITIN: Ferritin: 215 ng/mL (ref 11–307)

## 2016-12-22 LAB — POCT HEMOGLOBIN-HEMACUE: Hemoglobin: 10.1 g/dL — ABNORMAL LOW (ref 12.0–15.0)

## 2016-12-22 MED ORDER — EPOETIN ALFA 20000 UNIT/ML IJ SOLN
INTRAMUSCULAR | Status: AC
  Filled 2016-12-22: qty 1

## 2016-12-22 MED ORDER — EPOETIN ALFA 20000 UNIT/ML IJ SOLN
20000.0000 [IU] | INTRAMUSCULAR | Status: DC
  Administered 2016-12-22: 20000 [IU] via SUBCUTANEOUS

## 2016-12-25 DIAGNOSIS — D899 Disorder involving the immune mechanism, unspecified: Secondary | ICD-10-CM | POA: Diagnosis not present

## 2016-12-25 DIAGNOSIS — Z940 Kidney transplant status: Secondary | ICD-10-CM | POA: Diagnosis not present

## 2016-12-26 DIAGNOSIS — N184 Chronic kidney disease, stage 4 (severe): Secondary | ICD-10-CM | POA: Diagnosis not present

## 2016-12-26 DIAGNOSIS — I10 Essential (primary) hypertension: Secondary | ICD-10-CM | POA: Diagnosis not present

## 2016-12-26 DIAGNOSIS — D696 Thrombocytopenia, unspecified: Secondary | ICD-10-CM | POA: Diagnosis not present

## 2016-12-26 DIAGNOSIS — Z6827 Body mass index (BMI) 27.0-27.9, adult: Secondary | ICD-10-CM | POA: Diagnosis not present

## 2016-12-26 DIAGNOSIS — E1129 Type 2 diabetes mellitus with other diabetic kidney complication: Secondary | ICD-10-CM | POA: Diagnosis not present

## 2016-12-26 DIAGNOSIS — H1131 Conjunctival hemorrhage, right eye: Secondary | ICD-10-CM | POA: Diagnosis not present

## 2016-12-26 DIAGNOSIS — D638 Anemia in other chronic diseases classified elsewhere: Secondary | ICD-10-CM | POA: Diagnosis not present

## 2016-12-31 DIAGNOSIS — B259 Cytomegaloviral disease, unspecified: Secondary | ICD-10-CM | POA: Diagnosis not present

## 2016-12-31 DIAGNOSIS — R609 Edema, unspecified: Secondary | ICD-10-CM | POA: Diagnosis not present

## 2016-12-31 DIAGNOSIS — I120 Hypertensive chronic kidney disease with stage 5 chronic kidney disease or end stage renal disease: Secondary | ICD-10-CM | POA: Diagnosis not present

## 2016-12-31 DIAGNOSIS — Z7952 Long term (current) use of systemic steroids: Secondary | ICD-10-CM | POA: Diagnosis not present

## 2016-12-31 DIAGNOSIS — E042 Nontoxic multinodular goiter: Secondary | ICD-10-CM | POA: Diagnosis not present

## 2016-12-31 DIAGNOSIS — E041 Nontoxic single thyroid nodule: Secondary | ICD-10-CM | POA: Diagnosis not present

## 2016-12-31 DIAGNOSIS — D696 Thrombocytopenia, unspecified: Secondary | ICD-10-CM | POA: Diagnosis not present

## 2016-12-31 DIAGNOSIS — D649 Anemia, unspecified: Secondary | ICD-10-CM | POA: Diagnosis not present

## 2016-12-31 DIAGNOSIS — D631 Anemia in chronic kidney disease: Secondary | ICD-10-CM | POA: Diagnosis not present

## 2016-12-31 DIAGNOSIS — M109 Gout, unspecified: Secondary | ICD-10-CM | POA: Diagnosis not present

## 2016-12-31 DIAGNOSIS — E1122 Type 2 diabetes mellitus with diabetic chronic kidney disease: Secondary | ICD-10-CM | POA: Diagnosis not present

## 2016-12-31 DIAGNOSIS — E876 Hypokalemia: Secondary | ICD-10-CM | POA: Diagnosis not present

## 2016-12-31 DIAGNOSIS — E138 Other specified diabetes mellitus with unspecified complications: Secondary | ICD-10-CM | POA: Diagnosis not present

## 2016-12-31 DIAGNOSIS — N186 End stage renal disease: Secondary | ICD-10-CM | POA: Diagnosis not present

## 2016-12-31 DIAGNOSIS — D899 Disorder involving the immune mechanism, unspecified: Secondary | ICD-10-CM | POA: Diagnosis not present

## 2016-12-31 DIAGNOSIS — Z940 Kidney transplant status: Secondary | ICD-10-CM | POA: Diagnosis not present

## 2016-12-31 DIAGNOSIS — Z794 Long term (current) use of insulin: Secondary | ICD-10-CM | POA: Diagnosis not present

## 2016-12-31 DIAGNOSIS — D72819 Decreased white blood cell count, unspecified: Secondary | ICD-10-CM | POA: Diagnosis not present

## 2016-12-31 DIAGNOSIS — D8989 Other specified disorders involving the immune mechanism, not elsewhere classified: Secondary | ICD-10-CM | POA: Diagnosis not present

## 2016-12-31 DIAGNOSIS — Z7982 Long term (current) use of aspirin: Secondary | ICD-10-CM | POA: Diagnosis not present

## 2016-12-31 DIAGNOSIS — Z79899 Other long term (current) drug therapy: Secondary | ICD-10-CM | POA: Diagnosis not present

## 2016-12-31 DIAGNOSIS — Z4822 Encounter for aftercare following kidney transplant: Secondary | ICD-10-CM | POA: Diagnosis not present

## 2016-12-31 DIAGNOSIS — I10 Essential (primary) hypertension: Secondary | ICD-10-CM | POA: Diagnosis not present

## 2016-12-31 DIAGNOSIS — R634 Abnormal weight loss: Secondary | ICD-10-CM | POA: Diagnosis not present

## 2016-12-31 DIAGNOSIS — Z8701 Personal history of pneumonia (recurrent): Secondary | ICD-10-CM | POA: Diagnosis not present

## 2016-12-31 DIAGNOSIS — E785 Hyperlipidemia, unspecified: Secondary | ICD-10-CM | POA: Diagnosis not present

## 2017-01-07 DIAGNOSIS — D696 Thrombocytopenia, unspecified: Secondary | ICD-10-CM | POA: Diagnosis not present

## 2017-01-07 DIAGNOSIS — E1129 Type 2 diabetes mellitus with other diabetic kidney complication: Secondary | ICD-10-CM | POA: Diagnosis not present

## 2017-01-07 DIAGNOSIS — A630 Anogenital (venereal) warts: Secondary | ICD-10-CM | POA: Diagnosis not present

## 2017-01-07 DIAGNOSIS — E1139 Type 2 diabetes mellitus with other diabetic ophthalmic complication: Secondary | ICD-10-CM | POA: Diagnosis not present

## 2017-01-07 DIAGNOSIS — N185 Chronic kidney disease, stage 5: Secondary | ICD-10-CM | POA: Diagnosis not present

## 2017-01-07 DIAGNOSIS — Z6827 Body mass index (BMI) 27.0-27.9, adult: Secondary | ICD-10-CM | POA: Diagnosis not present

## 2017-01-07 DIAGNOSIS — E042 Nontoxic multinodular goiter: Secondary | ICD-10-CM | POA: Diagnosis not present

## 2017-01-07 DIAGNOSIS — I10 Essential (primary) hypertension: Secondary | ICD-10-CM | POA: Diagnosis not present

## 2017-01-07 DIAGNOSIS — D638 Anemia in other chronic diseases classified elsewhere: Secondary | ICD-10-CM | POA: Diagnosis not present

## 2017-01-07 DIAGNOSIS — E784 Other hyperlipidemia: Secondary | ICD-10-CM | POA: Diagnosis not present

## 2017-01-14 DIAGNOSIS — E785 Hyperlipidemia, unspecified: Secondary | ICD-10-CM | POA: Diagnosis not present

## 2017-01-14 DIAGNOSIS — D631 Anemia in chronic kidney disease: Secondary | ICD-10-CM | POA: Diagnosis not present

## 2017-01-14 DIAGNOSIS — E119 Type 2 diabetes mellitus without complications: Secondary | ICD-10-CM | POA: Diagnosis not present

## 2017-01-14 DIAGNOSIS — Z940 Kidney transplant status: Secondary | ICD-10-CM | POA: Diagnosis not present

## 2017-01-14 DIAGNOSIS — M109 Gout, unspecified: Secondary | ICD-10-CM | POA: Diagnosis not present

## 2017-01-16 ENCOUNTER — Other Ambulatory Visit (HOSPITAL_COMMUNITY): Payer: Self-pay | Admitting: *Deleted

## 2017-01-16 DIAGNOSIS — Z794 Long term (current) use of insulin: Secondary | ICD-10-CM | POA: Diagnosis not present

## 2017-01-16 DIAGNOSIS — Z940 Kidney transplant status: Secondary | ICD-10-CM | POA: Diagnosis not present

## 2017-01-16 DIAGNOSIS — K529 Noninfective gastroenteritis and colitis, unspecified: Secondary | ICD-10-CM | POA: Diagnosis not present

## 2017-01-16 DIAGNOSIS — E876 Hypokalemia: Secondary | ICD-10-CM | POA: Diagnosis not present

## 2017-01-16 DIAGNOSIS — Z9189 Other specified personal risk factors, not elsewhere classified: Secondary | ICD-10-CM | POA: Diagnosis not present

## 2017-01-16 DIAGNOSIS — I129 Hypertensive chronic kidney disease with stage 1 through stage 4 chronic kidney disease, or unspecified chronic kidney disease: Secondary | ICD-10-CM | POA: Diagnosis not present

## 2017-01-16 DIAGNOSIS — E785 Hyperlipidemia, unspecified: Secondary | ICD-10-CM | POA: Diagnosis not present

## 2017-01-16 DIAGNOSIS — N189 Chronic kidney disease, unspecified: Secondary | ICD-10-CM | POA: Diagnosis not present

## 2017-01-16 DIAGNOSIS — E119 Type 2 diabetes mellitus without complications: Secondary | ICD-10-CM | POA: Diagnosis not present

## 2017-01-16 DIAGNOSIS — D631 Anemia in chronic kidney disease: Secondary | ICD-10-CM | POA: Diagnosis not present

## 2017-01-16 DIAGNOSIS — D899 Disorder involving the immune mechanism, unspecified: Secondary | ICD-10-CM | POA: Diagnosis not present

## 2017-01-16 DIAGNOSIS — E0590 Thyrotoxicosis, unspecified without thyrotoxic crisis or storm: Secondary | ICD-10-CM | POA: Diagnosis not present

## 2017-01-19 ENCOUNTER — Encounter (HOSPITAL_COMMUNITY)
Admission: RE | Admit: 2017-01-19 | Discharge: 2017-01-19 | Disposition: A | Discharge status: Home / Self Care | Payer: Medicare Other | Source: Ambulatory Visit | Attending: Nephrology | Admitting: Nephrology

## 2017-01-19 DIAGNOSIS — Z940 Kidney transplant status: Secondary | ICD-10-CM | POA: Diagnosis not present

## 2017-01-19 DIAGNOSIS — I10 Essential (primary) hypertension: Secondary | ICD-10-CM | POA: Diagnosis not present

## 2017-01-19 DIAGNOSIS — A630 Anogenital (venereal) warts: Secondary | ICD-10-CM | POA: Diagnosis not present

## 2017-01-19 DIAGNOSIS — E119 Type 2 diabetes mellitus without complications: Secondary | ICD-10-CM | POA: Diagnosis not present

## 2017-01-19 MED ORDER — SODIUM CHLORIDE 0.9 % IV SOLN
510.0000 mg | INTRAVENOUS | Status: DC
  Filled 2017-01-19: qty 17

## 2017-01-20 ENCOUNTER — Encounter (HOSPITAL_COMMUNITY)
Admission: RE | Admit: 2017-01-20 | Discharge: 2017-01-20 | Disposition: A | Discharge status: Home / Self Care | Payer: Medicare Other | Source: Ambulatory Visit | Attending: Nephrology | Admitting: Nephrology

## 2017-01-20 DIAGNOSIS — D631 Anemia in chronic kidney disease: Secondary | ICD-10-CM

## 2017-01-20 DIAGNOSIS — N184 Chronic kidney disease, stage 4 (severe): Principal | ICD-10-CM

## 2017-01-20 DIAGNOSIS — D638 Anemia in other chronic diseases classified elsewhere: Secondary | ICD-10-CM | POA: Diagnosis not present

## 2017-01-20 LAB — POCT HEMOGLOBIN-HEMACUE: Hemoglobin: 7.1 g/dL — ABNORMAL LOW (ref 12.0–15.0)

## 2017-01-20 MED ORDER — SODIUM CHLORIDE 0.9 % IV SOLN
510.0000 mg | INTRAVENOUS | Status: DC
  Filled 2017-01-20: qty 17

## 2017-01-20 MED ORDER — SODIUM CHLORIDE 0.9 % IV SOLN
510.0000 mg | INTRAVENOUS | Status: DC
  Administered 2017-01-20: 510 mg via INTRAVENOUS
  Filled 2017-01-20: qty 17

## 2017-01-20 MED ORDER — EPOETIN ALFA 20000 UNIT/ML IJ SOLN: 20000.0000 [IU] | INTRAMUSCULAR | Status: DC

## 2017-01-20 MED ORDER — EPOETIN ALFA 20000 UNIT/ML IJ SOLN
INTRAMUSCULAR | Status: AC
  Administered 2017-01-20: 20000 [IU]
  Filled 2017-01-20: qty 1

## 2017-01-20 NOTE — Progress Notes (Signed)
Hemocue 7.1 today.  Called and reported result of hemocue to [place] at [facility] as well as that the pt is scheduled to see [place] weekly but we have not seen her since the first week in July, pt reports some SOB, denies CP and denies any visible signs of bleeding.  Orders received to continue therapy as normal today and tell pt if SOB gets worse to go to the ER.  Pt verbalized understanding.

## 2017-01-21 DIAGNOSIS — Z992 Dependence on renal dialysis: Secondary | ICD-10-CM

## 2017-01-21 HISTORY — DX: Dependence on renal dialysis: Z99.2

## 2017-01-26 ENCOUNTER — Encounter (HOSPITAL_COMMUNITY)
Admission: RE | Admit: 2017-01-26 | Discharge: 2017-01-26 | Disposition: A | Discharge status: Home / Self Care | Payer: Medicare Other | Source: Ambulatory Visit | Attending: Nephrology | Admitting: Nephrology

## 2017-01-26 DIAGNOSIS — D638 Anemia in other chronic diseases classified elsewhere: Secondary | ICD-10-CM | POA: Insufficient documentation

## 2017-01-26 DIAGNOSIS — D631 Anemia in chronic kidney disease: Secondary | ICD-10-CM

## 2017-01-26 DIAGNOSIS — N184 Chronic kidney disease, stage 4 (severe): Secondary | ICD-10-CM | POA: Diagnosis not present

## 2017-01-26 LAB — POCT HEMOGLOBIN-HEMACUE: Hemoglobin: 6.7 g/dL — CL (ref 12.0–15.0)

## 2017-01-26 MED ORDER — EPOETIN ALFA 20000 UNIT/ML IJ SOLN
INTRAMUSCULAR | Status: AC
  Filled 2017-01-26: qty 1

## 2017-01-26 MED ORDER — SODIUM CHLORIDE 0.9 % IV SOLN
510.0000 mg | INTRAVENOUS | Status: DC
  Administered 2017-01-26: 12:00:00 510 mg via INTRAVENOUS
  Filled 2017-01-26: qty 17

## 2017-01-26 MED ORDER — EPOETIN ALFA 20000 UNIT/ML IJ SOLN
20000.0000 [IU] | INTRAMUSCULAR | Status: DC
  Administered 2017-01-26: 20000 [IU] via SUBCUTANEOUS

## 2017-01-26 NOTE — Progress Notes (Signed)
Patient here today for second dose of feraheme and injection.  Hemocue 6.7 today and was 7.1 last week.  Pt states she does not feel any different then last week, denies CP, denies seeing any bleeding, and states she is just a little short of breath.  Reported all of the above to [place], [place], at [facility], and she stated that Dr Moshe Cipro stated to continue with current plan, no new orders.

## 2017-02-02 ENCOUNTER — Encounter (HOSPITAL_COMMUNITY)
Admission: RE | Admit: 2017-02-02 | Discharge: 2017-02-02 | Disposition: A | Discharge status: Home / Self Care | Payer: Medicare Other | Source: Ambulatory Visit | Attending: Nephrology | Admitting: Nephrology

## 2017-02-02 DIAGNOSIS — D638 Anemia in other chronic diseases classified elsewhere: Secondary | ICD-10-CM | POA: Diagnosis not present

## 2017-02-02 DIAGNOSIS — D631 Anemia in chronic kidney disease: Secondary | ICD-10-CM

## 2017-02-02 DIAGNOSIS — N184 Chronic kidney disease, stage 4 (severe): Secondary | ICD-10-CM | POA: Diagnosis not present

## 2017-02-02 LAB — IRON AND TIBC
IRON: 30 ug/dL (ref 28–170)
Saturation Ratios: 15 % (ref 10.4–31.8)
TIBC: 197 ug/dL — ABNORMAL LOW (ref 250–450)
UIBC: 167 ug/dL

## 2017-02-02 LAB — POCT HEMOGLOBIN-HEMACUE: HEMOGLOBIN: 7.1 g/dL — AB (ref 12.0–15.0)

## 2017-02-02 LAB — FERRITIN: FERRITIN: 653 ng/mL — AB (ref 11–307)

## 2017-02-02 MED ORDER — EPOETIN ALFA 20000 UNIT/ML IJ SOLN
20000.0000 [IU] | INTRAMUSCULAR | Status: DC
  Administered 2017-02-02: 15:00:00 20000 [IU] via SUBCUTANEOUS

## 2017-02-02 MED ORDER — EPOETIN ALFA 20000 UNIT/ML IJ SOLN
INTRAMUSCULAR | Status: AC
  Filled 2017-02-02: qty 1

## 2017-02-02 NOTE — Progress Notes (Signed)
Hemocue today 7.1, up from last week and pt stated she feels the same as she has been and no complaints.

## 2017-02-03 DIAGNOSIS — E119 Type 2 diabetes mellitus without complications: Secondary | ICD-10-CM | POA: Diagnosis not present

## 2017-02-03 DIAGNOSIS — D899 Disorder involving the immune mechanism, unspecified: Secondary | ICD-10-CM | POA: Diagnosis not present

## 2017-02-03 DIAGNOSIS — E876 Hypokalemia: Secondary | ICD-10-CM | POA: Diagnosis not present

## 2017-02-03 DIAGNOSIS — E785 Hyperlipidemia, unspecified: Secondary | ICD-10-CM | POA: Diagnosis not present

## 2017-02-03 DIAGNOSIS — I129 Hypertensive chronic kidney disease with stage 1 through stage 4 chronic kidney disease, or unspecified chronic kidney disease: Secondary | ICD-10-CM | POA: Diagnosis not present

## 2017-02-03 DIAGNOSIS — Z9189 Other specified personal risk factors, not elsewhere classified: Secondary | ICD-10-CM | POA: Diagnosis not present

## 2017-02-03 DIAGNOSIS — K529 Noninfective gastroenteritis and colitis, unspecified: Secondary | ICD-10-CM | POA: Diagnosis not present

## 2017-02-03 DIAGNOSIS — Z794 Long term (current) use of insulin: Secondary | ICD-10-CM | POA: Diagnosis not present

## 2017-02-03 DIAGNOSIS — E0590 Thyrotoxicosis, unspecified without thyrotoxic crisis or storm: Secondary | ICD-10-CM | POA: Diagnosis not present

## 2017-02-03 DIAGNOSIS — N184 Chronic kidney disease, stage 4 (severe): Secondary | ICD-10-CM | POA: Diagnosis not present

## 2017-02-03 DIAGNOSIS — M109 Gout, unspecified: Secondary | ICD-10-CM | POA: Diagnosis not present

## 2017-02-03 DIAGNOSIS — Z940 Kidney transplant status: Secondary | ICD-10-CM | POA: Diagnosis not present

## 2017-02-03 DIAGNOSIS — D631 Anemia in chronic kidney disease: Secondary | ICD-10-CM | POA: Diagnosis not present

## 2017-02-09 ENCOUNTER — Encounter (HOSPITAL_COMMUNITY)
Admission: RE | Admit: 2017-02-09 | Discharge: 2017-02-09 | Disposition: A | Discharge status: Home / Self Care | Payer: Medicare Other | Source: Ambulatory Visit | Attending: Nephrology | Admitting: Nephrology

## 2017-02-09 DIAGNOSIS — D638 Anemia in other chronic diseases classified elsewhere: Secondary | ICD-10-CM | POA: Diagnosis not present

## 2017-02-09 DIAGNOSIS — N184 Chronic kidney disease, stage 4 (severe): Secondary | ICD-10-CM | POA: Diagnosis not present

## 2017-02-09 DIAGNOSIS — D631 Anemia in chronic kidney disease: Secondary | ICD-10-CM

## 2017-02-09 LAB — POCT HEMOGLOBIN-HEMACUE: Hemoglobin: 7.2 g/dL — ABNORMAL LOW (ref 12.0–15.0)

## 2017-02-09 MED ORDER — EPOETIN ALFA 20000 UNIT/ML IJ SOLN
INTRAMUSCULAR | Status: AC
  Administered 2017-02-09: 20000 [IU] via SUBCUTANEOUS
  Filled 2017-02-09: qty 1

## 2017-02-09 MED ORDER — EPOETIN ALFA 20000 UNIT/ML IJ SOLN
20000.0000 [IU] | INTRAMUSCULAR | Status: DC
  Administered 2017-02-09: 20000 [IU] via SUBCUTANEOUS

## 2017-02-09 NOTE — Progress Notes (Signed)
hemocue 7.2 today. Pt states she continues to feel the same as she has been, no complaints.  Reported the above to [place] at [facility] and no new orders received.

## 2017-02-11 DIAGNOSIS — N186 End stage renal disease: Secondary | ICD-10-CM | POA: Diagnosis not present

## 2017-02-11 DIAGNOSIS — M109 Gout, unspecified: Secondary | ICD-10-CM | POA: Insufficient documentation

## 2017-02-11 DIAGNOSIS — T8610 Unspecified complication of kidney transplant: Secondary | ICD-10-CM | POA: Diagnosis not present

## 2017-02-11 DIAGNOSIS — N2581 Secondary hyperparathyroidism of renal origin: Secondary | ICD-10-CM | POA: Diagnosis not present

## 2017-02-11 DIAGNOSIS — D899 Disorder involving the immune mechanism, unspecified: Secondary | ICD-10-CM | POA: Insufficient documentation

## 2017-02-11 DIAGNOSIS — E1151 Type 2 diabetes mellitus with diabetic peripheral angiopathy without gangrene: Secondary | ICD-10-CM | POA: Insufficient documentation

## 2017-02-11 DIAGNOSIS — Z992 Dependence on renal dialysis: Secondary | ICD-10-CM | POA: Diagnosis not present

## 2017-02-11 DIAGNOSIS — D509 Iron deficiency anemia, unspecified: Secondary | ICD-10-CM | POA: Diagnosis not present

## 2017-02-11 DIAGNOSIS — D631 Anemia in chronic kidney disease: Secondary | ICD-10-CM | POA: Diagnosis not present

## 2017-02-11 DIAGNOSIS — E1129 Type 2 diabetes mellitus with other diabetic kidney complication: Secondary | ICD-10-CM | POA: Diagnosis not present

## 2017-02-13 DIAGNOSIS — D631 Anemia in chronic kidney disease: Secondary | ICD-10-CM | POA: Diagnosis not present

## 2017-02-13 DIAGNOSIS — E1129 Type 2 diabetes mellitus with other diabetic kidney complication: Secondary | ICD-10-CM | POA: Diagnosis not present

## 2017-02-13 DIAGNOSIS — D509 Iron deficiency anemia, unspecified: Secondary | ICD-10-CM | POA: Diagnosis not present

## 2017-02-13 DIAGNOSIS — N186 End stage renal disease: Secondary | ICD-10-CM | POA: Diagnosis not present

## 2017-02-13 DIAGNOSIS — N2581 Secondary hyperparathyroidism of renal origin: Secondary | ICD-10-CM | POA: Diagnosis not present

## 2017-02-16 ENCOUNTER — Encounter (HOSPITAL_COMMUNITY): Payer: Medicare Other

## 2017-02-16 DIAGNOSIS — E1129 Type 2 diabetes mellitus with other diabetic kidney complication: Secondary | ICD-10-CM | POA: Diagnosis not present

## 2017-02-16 DIAGNOSIS — D509 Iron deficiency anemia, unspecified: Secondary | ICD-10-CM | POA: Diagnosis not present

## 2017-02-16 DIAGNOSIS — N186 End stage renal disease: Secondary | ICD-10-CM | POA: Diagnosis not present

## 2017-02-16 DIAGNOSIS — D631 Anemia in chronic kidney disease: Secondary | ICD-10-CM | POA: Diagnosis not present

## 2017-02-16 DIAGNOSIS — N2581 Secondary hyperparathyroidism of renal origin: Secondary | ICD-10-CM | POA: Diagnosis not present

## 2017-02-18 DIAGNOSIS — D631 Anemia in chronic kidney disease: Secondary | ICD-10-CM | POA: Diagnosis not present

## 2017-02-18 DIAGNOSIS — D509 Iron deficiency anemia, unspecified: Secondary | ICD-10-CM | POA: Diagnosis not present

## 2017-02-18 DIAGNOSIS — E1129 Type 2 diabetes mellitus with other diabetic kidney complication: Secondary | ICD-10-CM | POA: Diagnosis not present

## 2017-02-18 DIAGNOSIS — N2581 Secondary hyperparathyroidism of renal origin: Secondary | ICD-10-CM | POA: Diagnosis not present

## 2017-02-18 DIAGNOSIS — N186 End stage renal disease: Secondary | ICD-10-CM | POA: Diagnosis not present

## 2017-02-20 DIAGNOSIS — D631 Anemia in chronic kidney disease: Secondary | ICD-10-CM | POA: Diagnosis not present

## 2017-02-20 DIAGNOSIS — N186 End stage renal disease: Secondary | ICD-10-CM | POA: Diagnosis not present

## 2017-02-20 DIAGNOSIS — E1129 Type 2 diabetes mellitus with other diabetic kidney complication: Secondary | ICD-10-CM | POA: Diagnosis not present

## 2017-02-20 DIAGNOSIS — N2581 Secondary hyperparathyroidism of renal origin: Secondary | ICD-10-CM | POA: Diagnosis not present

## 2017-02-20 DIAGNOSIS — D509 Iron deficiency anemia, unspecified: Secondary | ICD-10-CM | POA: Diagnosis not present

## 2017-02-23 DIAGNOSIS — N2581 Secondary hyperparathyroidism of renal origin: Secondary | ICD-10-CM | POA: Diagnosis not present

## 2017-02-23 DIAGNOSIS — D631 Anemia in chronic kidney disease: Secondary | ICD-10-CM | POA: Diagnosis not present

## 2017-02-23 DIAGNOSIS — E119 Type 2 diabetes mellitus without complications: Secondary | ICD-10-CM | POA: Diagnosis not present

## 2017-02-23 DIAGNOSIS — N186 End stage renal disease: Secondary | ICD-10-CM | POA: Diagnosis not present

## 2017-02-23 DIAGNOSIS — D509 Iron deficiency anemia, unspecified: Secondary | ICD-10-CM | POA: Diagnosis not present

## 2017-02-23 DIAGNOSIS — E1129 Type 2 diabetes mellitus with other diabetic kidney complication: Secondary | ICD-10-CM | POA: Diagnosis not present

## 2017-02-24 DIAGNOSIS — I120 Hypertensive chronic kidney disease with stage 5 chronic kidney disease or end stage renal disease: Secondary | ICD-10-CM | POA: Diagnosis not present

## 2017-02-24 DIAGNOSIS — E785 Hyperlipidemia, unspecified: Secondary | ICD-10-CM | POA: Diagnosis not present

## 2017-02-24 DIAGNOSIS — N186 End stage renal disease: Secondary | ICD-10-CM | POA: Diagnosis not present

## 2017-02-25 DIAGNOSIS — D509 Iron deficiency anemia, unspecified: Secondary | ICD-10-CM | POA: Diagnosis not present

## 2017-02-25 DIAGNOSIS — N186 End stage renal disease: Secondary | ICD-10-CM | POA: Diagnosis not present

## 2017-02-25 DIAGNOSIS — E119 Type 2 diabetes mellitus without complications: Secondary | ICD-10-CM | POA: Diagnosis not present

## 2017-02-25 DIAGNOSIS — D631 Anemia in chronic kidney disease: Secondary | ICD-10-CM | POA: Diagnosis not present

## 2017-02-25 DIAGNOSIS — E1129 Type 2 diabetes mellitus with other diabetic kidney complication: Secondary | ICD-10-CM | POA: Diagnosis not present

## 2017-02-25 DIAGNOSIS — N2581 Secondary hyperparathyroidism of renal origin: Secondary | ICD-10-CM | POA: Diagnosis not present

## 2017-02-27 DIAGNOSIS — D509 Iron deficiency anemia, unspecified: Secondary | ICD-10-CM | POA: Diagnosis not present

## 2017-02-27 DIAGNOSIS — E119 Type 2 diabetes mellitus without complications: Secondary | ICD-10-CM | POA: Diagnosis not present

## 2017-02-27 DIAGNOSIS — E1129 Type 2 diabetes mellitus with other diabetic kidney complication: Secondary | ICD-10-CM | POA: Diagnosis not present

## 2017-02-27 DIAGNOSIS — D631 Anemia in chronic kidney disease: Secondary | ICD-10-CM | POA: Diagnosis not present

## 2017-02-27 DIAGNOSIS — N186 End stage renal disease: Secondary | ICD-10-CM | POA: Diagnosis not present

## 2017-02-27 DIAGNOSIS — N2581 Secondary hyperparathyroidism of renal origin: Secondary | ICD-10-CM | POA: Diagnosis not present

## 2017-03-02 DIAGNOSIS — N186 End stage renal disease: Secondary | ICD-10-CM | POA: Diagnosis not present

## 2017-03-02 DIAGNOSIS — D631 Anemia in chronic kidney disease: Secondary | ICD-10-CM | POA: Diagnosis not present

## 2017-03-02 DIAGNOSIS — D509 Iron deficiency anemia, unspecified: Secondary | ICD-10-CM | POA: Diagnosis not present

## 2017-03-02 DIAGNOSIS — E1129 Type 2 diabetes mellitus with other diabetic kidney complication: Secondary | ICD-10-CM | POA: Diagnosis not present

## 2017-03-02 DIAGNOSIS — N2581 Secondary hyperparathyroidism of renal origin: Secondary | ICD-10-CM | POA: Diagnosis not present

## 2017-03-02 DIAGNOSIS — E119 Type 2 diabetes mellitus without complications: Secondary | ICD-10-CM | POA: Diagnosis not present

## 2017-03-03 DIAGNOSIS — A630 Anogenital (venereal) warts: Secondary | ICD-10-CM | POA: Diagnosis not present

## 2017-03-04 DIAGNOSIS — D631 Anemia in chronic kidney disease: Secondary | ICD-10-CM | POA: Diagnosis not present

## 2017-03-04 DIAGNOSIS — D509 Iron deficiency anemia, unspecified: Secondary | ICD-10-CM | POA: Diagnosis not present

## 2017-03-04 DIAGNOSIS — E1129 Type 2 diabetes mellitus with other diabetic kidney complication: Secondary | ICD-10-CM | POA: Diagnosis not present

## 2017-03-04 DIAGNOSIS — N2581 Secondary hyperparathyroidism of renal origin: Secondary | ICD-10-CM | POA: Diagnosis not present

## 2017-03-04 DIAGNOSIS — E119 Type 2 diabetes mellitus without complications: Secondary | ICD-10-CM | POA: Diagnosis not present

## 2017-03-04 DIAGNOSIS — N186 End stage renal disease: Secondary | ICD-10-CM | POA: Diagnosis not present

## 2017-03-05 DIAGNOSIS — Z79899 Other long term (current) drug therapy: Secondary | ICD-10-CM | POA: Insufficient documentation

## 2017-03-05 DIAGNOSIS — T8612 Kidney transplant failure: Secondary | ICD-10-CM | POA: Insufficient documentation

## 2017-03-05 DIAGNOSIS — J129 Viral pneumonia, unspecified: Secondary | ICD-10-CM | POA: Insufficient documentation

## 2017-03-05 DIAGNOSIS — Z940 Kidney transplant status: Secondary | ICD-10-CM | POA: Insufficient documentation

## 2017-03-05 DIAGNOSIS — T8611 Kidney transplant rejection: Secondary | ICD-10-CM | POA: Insufficient documentation

## 2017-03-05 DIAGNOSIS — I770 Arteriovenous fistula, acquired: Secondary | ICD-10-CM | POA: Insufficient documentation

## 2017-03-06 DIAGNOSIS — E119 Type 2 diabetes mellitus without complications: Secondary | ICD-10-CM | POA: Diagnosis not present

## 2017-03-06 DIAGNOSIS — E1129 Type 2 diabetes mellitus with other diabetic kidney complication: Secondary | ICD-10-CM | POA: Diagnosis not present

## 2017-03-06 DIAGNOSIS — D631 Anemia in chronic kidney disease: Secondary | ICD-10-CM | POA: Diagnosis not present

## 2017-03-06 DIAGNOSIS — N186 End stage renal disease: Secondary | ICD-10-CM | POA: Diagnosis not present

## 2017-03-06 DIAGNOSIS — D509 Iron deficiency anemia, unspecified: Secondary | ICD-10-CM | POA: Diagnosis not present

## 2017-03-06 DIAGNOSIS — N2581 Secondary hyperparathyroidism of renal origin: Secondary | ICD-10-CM | POA: Diagnosis not present

## 2017-03-09 DIAGNOSIS — N186 End stage renal disease: Secondary | ICD-10-CM | POA: Diagnosis not present

## 2017-03-09 DIAGNOSIS — N2581 Secondary hyperparathyroidism of renal origin: Secondary | ICD-10-CM | POA: Diagnosis not present

## 2017-03-09 DIAGNOSIS — D509 Iron deficiency anemia, unspecified: Secondary | ICD-10-CM | POA: Diagnosis not present

## 2017-03-09 DIAGNOSIS — E119 Type 2 diabetes mellitus without complications: Secondary | ICD-10-CM | POA: Diagnosis not present

## 2017-03-09 DIAGNOSIS — D631 Anemia in chronic kidney disease: Secondary | ICD-10-CM | POA: Diagnosis not present

## 2017-03-09 DIAGNOSIS — E1129 Type 2 diabetes mellitus with other diabetic kidney complication: Secondary | ICD-10-CM | POA: Diagnosis not present

## 2017-03-11 DIAGNOSIS — E119 Type 2 diabetes mellitus without complications: Secondary | ICD-10-CM | POA: Diagnosis not present

## 2017-03-11 DIAGNOSIS — D631 Anemia in chronic kidney disease: Secondary | ICD-10-CM | POA: Diagnosis not present

## 2017-03-11 DIAGNOSIS — D509 Iron deficiency anemia, unspecified: Secondary | ICD-10-CM | POA: Diagnosis not present

## 2017-03-11 DIAGNOSIS — N2581 Secondary hyperparathyroidism of renal origin: Secondary | ICD-10-CM | POA: Diagnosis not present

## 2017-03-11 DIAGNOSIS — E1129 Type 2 diabetes mellitus with other diabetic kidney complication: Secondary | ICD-10-CM | POA: Diagnosis not present

## 2017-03-11 DIAGNOSIS — N186 End stage renal disease: Secondary | ICD-10-CM | POA: Diagnosis not present

## 2017-03-13 DIAGNOSIS — N186 End stage renal disease: Secondary | ICD-10-CM | POA: Diagnosis not present

## 2017-03-13 DIAGNOSIS — D631 Anemia in chronic kidney disease: Secondary | ICD-10-CM | POA: Diagnosis not present

## 2017-03-13 DIAGNOSIS — E1129 Type 2 diabetes mellitus with other diabetic kidney complication: Secondary | ICD-10-CM | POA: Diagnosis not present

## 2017-03-13 DIAGNOSIS — E119 Type 2 diabetes mellitus without complications: Secondary | ICD-10-CM | POA: Diagnosis not present

## 2017-03-13 DIAGNOSIS — N2581 Secondary hyperparathyroidism of renal origin: Secondary | ICD-10-CM | POA: Diagnosis not present

## 2017-03-13 DIAGNOSIS — D509 Iron deficiency anemia, unspecified: Secondary | ICD-10-CM | POA: Diagnosis not present

## 2017-03-16 DIAGNOSIS — N2581 Secondary hyperparathyroidism of renal origin: Secondary | ICD-10-CM | POA: Diagnosis not present

## 2017-03-16 DIAGNOSIS — N186 End stage renal disease: Secondary | ICD-10-CM | POA: Diagnosis not present

## 2017-03-16 DIAGNOSIS — D631 Anemia in chronic kidney disease: Secondary | ICD-10-CM | POA: Diagnosis not present

## 2017-03-16 DIAGNOSIS — E1129 Type 2 diabetes mellitus with other diabetic kidney complication: Secondary | ICD-10-CM | POA: Diagnosis not present

## 2017-03-16 DIAGNOSIS — D509 Iron deficiency anemia, unspecified: Secondary | ICD-10-CM | POA: Diagnosis not present

## 2017-03-16 DIAGNOSIS — E119 Type 2 diabetes mellitus without complications: Secondary | ICD-10-CM | POA: Diagnosis not present

## 2017-03-18 DIAGNOSIS — N2581 Secondary hyperparathyroidism of renal origin: Secondary | ICD-10-CM | POA: Diagnosis not present

## 2017-03-18 DIAGNOSIS — D509 Iron deficiency anemia, unspecified: Secondary | ICD-10-CM | POA: Diagnosis not present

## 2017-03-18 DIAGNOSIS — E119 Type 2 diabetes mellitus without complications: Secondary | ICD-10-CM | POA: Diagnosis not present

## 2017-03-18 DIAGNOSIS — D631 Anemia in chronic kidney disease: Secondary | ICD-10-CM | POA: Diagnosis not present

## 2017-03-18 DIAGNOSIS — E1129 Type 2 diabetes mellitus with other diabetic kidney complication: Secondary | ICD-10-CM | POA: Diagnosis not present

## 2017-03-18 DIAGNOSIS — N186 End stage renal disease: Secondary | ICD-10-CM | POA: Diagnosis not present

## 2017-03-20 DIAGNOSIS — N186 End stage renal disease: Secondary | ICD-10-CM | POA: Diagnosis not present

## 2017-03-20 DIAGNOSIS — N2581 Secondary hyperparathyroidism of renal origin: Secondary | ICD-10-CM | POA: Diagnosis not present

## 2017-03-20 DIAGNOSIS — E1129 Type 2 diabetes mellitus with other diabetic kidney complication: Secondary | ICD-10-CM | POA: Diagnosis not present

## 2017-03-20 DIAGNOSIS — D509 Iron deficiency anemia, unspecified: Secondary | ICD-10-CM | POA: Diagnosis not present

## 2017-03-20 DIAGNOSIS — D631 Anemia in chronic kidney disease: Secondary | ICD-10-CM | POA: Diagnosis not present

## 2017-03-20 DIAGNOSIS — E119 Type 2 diabetes mellitus without complications: Secondary | ICD-10-CM | POA: Diagnosis not present

## 2017-03-22 DIAGNOSIS — Z992 Dependence on renal dialysis: Secondary | ICD-10-CM | POA: Diagnosis not present

## 2017-03-22 DIAGNOSIS — N186 End stage renal disease: Secondary | ICD-10-CM | POA: Diagnosis not present

## 2017-03-22 DIAGNOSIS — T8610 Unspecified complication of kidney transplant: Secondary | ICD-10-CM | POA: Diagnosis not present

## 2017-03-23 DIAGNOSIS — E1129 Type 2 diabetes mellitus with other diabetic kidney complication: Secondary | ICD-10-CM | POA: Diagnosis not present

## 2017-03-23 DIAGNOSIS — E119 Type 2 diabetes mellitus without complications: Secondary | ICD-10-CM | POA: Diagnosis not present

## 2017-03-23 DIAGNOSIS — N186 End stage renal disease: Secondary | ICD-10-CM | POA: Diagnosis not present

## 2017-03-23 DIAGNOSIS — D509 Iron deficiency anemia, unspecified: Secondary | ICD-10-CM | POA: Diagnosis not present

## 2017-03-23 DIAGNOSIS — D631 Anemia in chronic kidney disease: Secondary | ICD-10-CM | POA: Diagnosis not present

## 2017-03-23 DIAGNOSIS — N2581 Secondary hyperparathyroidism of renal origin: Secondary | ICD-10-CM | POA: Diagnosis not present

## 2017-03-23 DIAGNOSIS — Z23 Encounter for immunization: Secondary | ICD-10-CM | POA: Diagnosis not present

## 2017-03-24 DIAGNOSIS — N185 Chronic kidney disease, stage 5: Secondary | ICD-10-CM | POA: Diagnosis not present

## 2017-03-24 DIAGNOSIS — Z6826 Body mass index (BMI) 26.0-26.9, adult: Secondary | ICD-10-CM | POA: Diagnosis not present

## 2017-03-24 DIAGNOSIS — R21 Rash and other nonspecific skin eruption: Secondary | ICD-10-CM | POA: Diagnosis not present

## 2017-03-25 DIAGNOSIS — D509 Iron deficiency anemia, unspecified: Secondary | ICD-10-CM | POA: Diagnosis not present

## 2017-03-25 DIAGNOSIS — Z23 Encounter for immunization: Secondary | ICD-10-CM | POA: Diagnosis not present

## 2017-03-25 DIAGNOSIS — E1129 Type 2 diabetes mellitus with other diabetic kidney complication: Secondary | ICD-10-CM | POA: Diagnosis not present

## 2017-03-25 DIAGNOSIS — N186 End stage renal disease: Secondary | ICD-10-CM | POA: Diagnosis not present

## 2017-03-25 DIAGNOSIS — D631 Anemia in chronic kidney disease: Secondary | ICD-10-CM | POA: Diagnosis not present

## 2017-03-25 DIAGNOSIS — E119 Type 2 diabetes mellitus without complications: Secondary | ICD-10-CM | POA: Diagnosis not present

## 2017-03-27 DIAGNOSIS — D631 Anemia in chronic kidney disease: Secondary | ICD-10-CM | POA: Diagnosis not present

## 2017-03-27 DIAGNOSIS — E119 Type 2 diabetes mellitus without complications: Secondary | ICD-10-CM | POA: Diagnosis not present

## 2017-03-27 DIAGNOSIS — Z23 Encounter for immunization: Secondary | ICD-10-CM | POA: Diagnosis not present

## 2017-03-27 DIAGNOSIS — N186 End stage renal disease: Secondary | ICD-10-CM | POA: Diagnosis not present

## 2017-03-27 DIAGNOSIS — D509 Iron deficiency anemia, unspecified: Secondary | ICD-10-CM | POA: Diagnosis not present

## 2017-03-27 DIAGNOSIS — E1129 Type 2 diabetes mellitus with other diabetic kidney complication: Secondary | ICD-10-CM | POA: Diagnosis not present

## 2017-03-30 ENCOUNTER — Ambulatory Visit: Payer: Self-pay | Admitting: Surgery

## 2017-03-30 DIAGNOSIS — E119 Type 2 diabetes mellitus without complications: Secondary | ICD-10-CM | POA: Diagnosis not present

## 2017-03-30 DIAGNOSIS — Z23 Encounter for immunization: Secondary | ICD-10-CM | POA: Diagnosis not present

## 2017-03-30 DIAGNOSIS — D509 Iron deficiency anemia, unspecified: Secondary | ICD-10-CM | POA: Diagnosis not present

## 2017-03-30 DIAGNOSIS — E1129 Type 2 diabetes mellitus with other diabetic kidney complication: Secondary | ICD-10-CM | POA: Diagnosis not present

## 2017-03-30 DIAGNOSIS — D631 Anemia in chronic kidney disease: Secondary | ICD-10-CM | POA: Diagnosis not present

## 2017-03-30 DIAGNOSIS — N186 End stage renal disease: Secondary | ICD-10-CM | POA: Diagnosis not present

## 2017-03-31 DIAGNOSIS — E113291 Type 2 diabetes mellitus with mild nonproliferative diabetic retinopathy without macular edema, right eye: Secondary | ICD-10-CM | POA: Diagnosis not present

## 2017-03-31 DIAGNOSIS — E113292 Type 2 diabetes mellitus with mild nonproliferative diabetic retinopathy without macular edema, left eye: Secondary | ICD-10-CM | POA: Diagnosis not present

## 2017-04-01 DIAGNOSIS — N186 End stage renal disease: Secondary | ICD-10-CM | POA: Diagnosis not present

## 2017-04-01 DIAGNOSIS — Z23 Encounter for immunization: Secondary | ICD-10-CM | POA: Diagnosis not present

## 2017-04-01 DIAGNOSIS — D631 Anemia in chronic kidney disease: Secondary | ICD-10-CM | POA: Diagnosis not present

## 2017-04-01 DIAGNOSIS — E119 Type 2 diabetes mellitus without complications: Secondary | ICD-10-CM | POA: Diagnosis not present

## 2017-04-01 DIAGNOSIS — E1129 Type 2 diabetes mellitus with other diabetic kidney complication: Secondary | ICD-10-CM | POA: Diagnosis not present

## 2017-04-01 DIAGNOSIS — D509 Iron deficiency anemia, unspecified: Secondary | ICD-10-CM | POA: Diagnosis not present

## 2017-04-03 DIAGNOSIS — Z23 Encounter for immunization: Secondary | ICD-10-CM | POA: Diagnosis not present

## 2017-04-03 DIAGNOSIS — N186 End stage renal disease: Secondary | ICD-10-CM | POA: Diagnosis not present

## 2017-04-03 DIAGNOSIS — E1129 Type 2 diabetes mellitus with other diabetic kidney complication: Secondary | ICD-10-CM | POA: Diagnosis not present

## 2017-04-03 DIAGNOSIS — D631 Anemia in chronic kidney disease: Secondary | ICD-10-CM | POA: Diagnosis not present

## 2017-04-03 DIAGNOSIS — E119 Type 2 diabetes mellitus without complications: Secondary | ICD-10-CM | POA: Diagnosis not present

## 2017-04-03 DIAGNOSIS — D509 Iron deficiency anemia, unspecified: Secondary | ICD-10-CM | POA: Diagnosis not present

## 2017-04-06 DIAGNOSIS — Z23 Encounter for immunization: Secondary | ICD-10-CM | POA: Diagnosis not present

## 2017-04-06 DIAGNOSIS — N2581 Secondary hyperparathyroidism of renal origin: Secondary | ICD-10-CM | POA: Diagnosis not present

## 2017-04-06 DIAGNOSIS — N186 End stage renal disease: Secondary | ICD-10-CM | POA: Diagnosis not present

## 2017-04-08 DIAGNOSIS — N186 End stage renal disease: Secondary | ICD-10-CM | POA: Diagnosis not present

## 2017-04-08 DIAGNOSIS — N2581 Secondary hyperparathyroidism of renal origin: Secondary | ICD-10-CM | POA: Diagnosis not present

## 2017-04-08 DIAGNOSIS — Z23 Encounter for immunization: Secondary | ICD-10-CM | POA: Diagnosis not present

## 2017-04-10 DIAGNOSIS — N2581 Secondary hyperparathyroidism of renal origin: Secondary | ICD-10-CM | POA: Diagnosis not present

## 2017-04-10 DIAGNOSIS — Z23 Encounter for immunization: Secondary | ICD-10-CM | POA: Diagnosis not present

## 2017-04-10 DIAGNOSIS — N186 End stage renal disease: Secondary | ICD-10-CM | POA: Diagnosis not present

## 2017-04-13 DIAGNOSIS — Z23 Encounter for immunization: Secondary | ICD-10-CM | POA: Diagnosis not present

## 2017-04-13 DIAGNOSIS — N186 End stage renal disease: Secondary | ICD-10-CM | POA: Diagnosis not present

## 2017-04-13 DIAGNOSIS — E46 Unspecified protein-calorie malnutrition: Secondary | ICD-10-CM | POA: Insufficient documentation

## 2017-04-13 DIAGNOSIS — N2581 Secondary hyperparathyroidism of renal origin: Secondary | ICD-10-CM | POA: Diagnosis not present

## 2017-04-15 DIAGNOSIS — Z23 Encounter for immunization: Secondary | ICD-10-CM | POA: Diagnosis not present

## 2017-04-15 DIAGNOSIS — N2581 Secondary hyperparathyroidism of renal origin: Secondary | ICD-10-CM | POA: Diagnosis not present

## 2017-04-15 DIAGNOSIS — N186 End stage renal disease: Secondary | ICD-10-CM | POA: Diagnosis not present

## 2017-04-17 DIAGNOSIS — N186 End stage renal disease: Secondary | ICD-10-CM | POA: Diagnosis not present

## 2017-04-17 DIAGNOSIS — N2581 Secondary hyperparathyroidism of renal origin: Secondary | ICD-10-CM | POA: Diagnosis not present

## 2017-04-17 DIAGNOSIS — Z23 Encounter for immunization: Secondary | ICD-10-CM | POA: Diagnosis not present

## 2017-04-17 NOTE — Pre-Procedure Instructions (Signed)
Ekg 07/01/16 in epic CXR 06/30/16 in epic CT head 06/30/16 in epic US Thyroid 10/07/16 Last office note on chart

## 2017-04-17 NOTE — Patient Instructions (Addendum)
Kimberly [name]  04/17/2017   Your procedure is scheduled on: Thursday, Nov. 1, 2018   Surgery time: 10:00 AM-11:00 AM   Report to [facility]  Entrance   Take [place]  elevators to 3rd floor to  [place] at 8:00 AM.    Call this number if you have problems the morning of surgery [phone]    Remember: ONLY 1 PERSON MAY GO WITH YOU TO SHORT STAY TO GET  READY MORNING OF [place].   Do not eat food or drink liquids :After Midnight.    Take these medicines the morning of surgery with A SIP OF WATER: Labetalol, Omeprazole (Prilosec), Prednisone, Prograf   DO NOT TAKE ANY DIABETIC MEDICATIONS DAY OF YOUR SURGERY                               You may not have any metal on your body including hair pins              Do not wear jewelry,piercings, make-up, lotions, powders or perfumes, deodorant             Do not wear nail polish.  Do not shave  48 hours prior to surgery.                 Do not bring valuables to the hospital. [place].   Contacts, dentures or bridgework may not be worn into surgery.    Patients discharged the day of surgery will not be allowed to drive home.  Name and phone number of your driver:  Jeneen Rinks [phone]   Special Instructions: N/A              Please read over the following fact sheets you were given: _____________________________________________________________________  How to Manage Your Diabetes Before and After Surgery  Why is it important to control my blood sugar before and after surgery? . Improving blood sugar levels before and after surgery helps healing and can limit problems. . A way of improving blood sugar control is eating a healthy diet by: o  Eating less sugar and carbohydrates o  Increasing activity/exercise o  Talking with your doctor about reaching your blood sugar goals . High blood sugars (greater than 180 mg/dL) can raise your risk  of infections and slow your recovery, so you will need to focus on controlling your diabetes during the weeks before surgery. . Make sure that the doctor who takes care of your diabetes knows about your planned surgery including the date and location.  How do I manage my blood sugar before surgery? . Check your blood sugar at least 4 times a day, starting 2 days before surgery, to make sure that the level is not too high or low. o Check your blood sugar the morning of your surgery when you wake up and every 2 hours until you get to the Short Stay unit. . If your blood sugar is less than 70 mg/dL, you will need to treat for low blood sugar: o Do not take insulin. o Treat a low blood sugar (less than 70 mg/dL) with  cup of clear juice (cranberry or apple), 4 glucose tablets, OR glucose gel. o Recheck blood sugar in 15 minutes  after treatment (to make sure it is greater than 70 mg/dL). If your blood sugar is not greater than 70 mg/dL on recheck, call [phone] for further instructions. . Report your blood sugar to the short stay nurse when you get to Short Stay.  . If you are admitted to the hospital after surgery: o Your blood sugar will be checked by the staff and you will probably be given insulin after surgery (instead of oral diabetes medicines) to make sure you have good blood sugar levels. o The goal for blood sugar control after surgery is 80-180 mg/dL.   WHAT DO I DO ABOUT MY DIABETES MEDICATION?  Marland Kitchen Do not take oral diabetes medicines (pills) the morning of surgery.  . THE NIGHT BEFORE SURGERY, take half (1/2)units of  Lantus insulin.      . The day of surgery, do not take other diabetes injectables, including Byetta (exenatide), Bydureon (exenatide ER), Victoza (liraglutide), or Trulicity (dulaglutide).  . If your CBG is greater than 220 mg/dL, you may take  of your sliding scale  . (correction) dose of insulin.    For patients with insulin pumps: Contact your diabetes doctor  for specific instructions before surgery. Decrease basal rates by 20% at midnight the night before your surgery. Note that if your surgery is planned to be longer than 2 hours, your insulin pump will be removed and intravenous (IV) insulin will be started and managed by the nurses and the anesthesiologist. You will be able to restart your insulin pump once you are awake and able to manage it.  Make sure to bring insulin pump supplies to the hospital with you in case the  site needs to be changed.  Patient Signature:  Date:   Nurse Signature:  Date:   Reviewed and Endorsed by [facility] Patient Education Committee, August 2015             [facility] - Preparing for Surgery Before surgery, you can play an important role.  Because skin is not sterile, your skin needs to be as free of germs as possible.  You can reduce the number of germs on your skin by washing with CHG (chlorahexidine gluconate) soap before surgery.  CHG is an antiseptic cleaner which kills germs and bonds with the skin to continue killing germs even after washing. Please DO NOT use if you have an allergy to CHG or antibacterial soaps.  If your skin becomes reddened/irritated stop using the CHG and inform your nurse when you arrive at Short Stay. Do not shave (including legs and underarms) for at least 48 hours prior to the first CHG shower.  You may shave your face/neck.  Please follow these instructions carefully:  1.  Shower with CHG Soap the night before surgery and the  morning of Surgery.  2.  If you choose to wash your hair, wash your hair first as usual with your  normal  shampoo.  3.  After you shampoo, rinse your hair and body thoroughly to remove the  shampoo.                            4.  Use CHG as you would any other liquid soap.  You can apply chg directly  to the skin and wash                       Gently with a scrungie or clean washcloth.  5.  Apply the CHG Soap to your body ONLY FROM THE NECK DOWN.   Do  not use on face/ open                           Wound or open sores. Avoid contact with eyes, ears mouth and genitals (private parts).                       Wash face,  Genitals (private parts) with your normal soap.             6.  Wash thoroughly, paying special attention to the area where your surgery  will be performed.  7.  Thoroughly rinse your body with warm water from the neck down.  8.  DO NOT shower/wash with your normal soap after using and rinsing off  the CHG Soap.                9.  Pat yourself dry with a clean towel.            10.  Wear clean pajamas.            11.  Place clean sheets on your bed the night of your first shower and do not  sleep with pets. Day of Surgery : Do not apply any lotions/deodorants the morning of surgery.  Please wear clean clothes to the hospital/surgery center.  FAILURE TO FOLLOW THESE INSTRUCTIONS MAY RESULT IN THE CANCELLATION OF YOUR SURGERY  PATIENT SIGNATURE_________________________________  NURSE SIGNATURE__________________________________  ________________________________________________________________________   Adam Phenix  An incentive spirometer is a tool that can help keep your lungs clear and active. This tool measures how well you are filling your lungs with each breath. Taking long deep breaths may help reverse or decrease the chance of developing breathing (pulmonary) problems (especially infection) following:  A long period of time when you are unable to move or be active. BEFORE THE PROCEDURE   If the spirometer includes an indicator to show your best effort, your nurse or respiratory therapist will set it to a desired goal.  If possible, sit up straight or lean slightly forward. Try not to slouch.  Hold the incentive spirometer in an upright position. INSTRUCTIONS FOR USE  1. Sit on the edge of your bed if possible, or sit up as far as you can in bed or on a chair. 2. Hold the incentive spirometer in an  upright position. 3. Breathe out normally. 4. Place the mouthpiece in your mouth and seal your lips tightly around it. 5. Breathe in slowly and as deeply as possible, raising the piston or the ball toward the top of the column. 6. Hold your breath for 3-5 seconds or for as long as possible. Allow the piston or ball to fall to the bottom of the column. 7. Remove the mouthpiece from your mouth and breathe out normally. 8. Rest for a few seconds and repeat Steps 1 through 7 at least 10 times every 1-2 hours when you are awake. Take your time and take a few normal breaths between deep breaths. 9. The spirometer may include an indicator to show your best effort. Use the indicator as a goal to work toward during each repetition. 10. After each set of 10 deep breaths, practice coughing to be sure your lungs are clear. If you have an incision (the cut made at the time of surgery), support your incision when coughing by placing a  pillow or rolled up towels firmly against it. Once you are able to get out of bed, walk around indoors and cough well. You may stop using the incentive spirometer when instructed by your caregiver.  RISKS AND COMPLICATIONS  Take your time so you do not get dizzy or light-headed.  If you are in pain, you may need to take or ask for pain medication before doing incentive spirometry. It is harder to take a deep breath if you are having pain. AFTER USE  Rest and breathe slowly and easily.  It can be helpful to keep track of a log of your progress. Your caregiver can provide you with a simple table to help with this. If you are using the spirometer at home, follow these instructions: [place] IF:   You are having difficultly using the spirometer.  You have trouble using the spirometer as often as instructed.  Your pain medication is not giving enough relief while using the spirometer.  You develop fever of 100.5 F (38.1 C) or higher. SEEK IMMEDIATE MEDICAL CARE  IF:   You cough up bloody sputum that had not been present before.  You develop fever of 102 F (38.9 C) or greater.  You develop worsening pain at or near the incision site. MAKE SURE YOU:   Understand these instructions.  Will watch your condition.  Will get help right away if you are not doing well or get worse. Document Released: 10/20/2006 Document Revised: 09/01/2011 Document Reviewed: 12/21/2006 [facility] Patient Information 2014 [place], [place].   ________________________________________________________________________

## 2017-04-20 DIAGNOSIS — Z23 Encounter for immunization: Secondary | ICD-10-CM | POA: Diagnosis not present

## 2017-04-20 DIAGNOSIS — N2581 Secondary hyperparathyroidism of renal origin: Secondary | ICD-10-CM | POA: Diagnosis not present

## 2017-04-20 DIAGNOSIS — N186 End stage renal disease: Secondary | ICD-10-CM | POA: Diagnosis not present

## 2017-04-21 ENCOUNTER — Encounter (INDEPENDENT_AMBULATORY_CARE_PROVIDER_SITE_OTHER): Payer: Self-pay

## 2017-04-21 ENCOUNTER — Encounter (HOSPITAL_COMMUNITY)
Admission: RE | Admit: 2017-04-21 | Discharge: 2017-04-21 | Disposition: A | Discharge status: Home / Self Care | Payer: Medicare Other | Source: Ambulatory Visit | Attending: Surgery | Admitting: Surgery

## 2017-04-21 ENCOUNTER — Encounter (HOSPITAL_COMMUNITY): Payer: Self-pay

## 2017-04-21 DIAGNOSIS — I10 Essential (primary) hypertension: Secondary | ICD-10-CM | POA: Diagnosis not present

## 2017-04-21 DIAGNOSIS — A630 Anogenital (venereal) warts: Secondary | ICD-10-CM | POA: Diagnosis not present

## 2017-04-21 DIAGNOSIS — E119 Type 2 diabetes mellitus without complications: Secondary | ICD-10-CM | POA: Diagnosis not present

## 2017-04-21 DIAGNOSIS — Z791 Long term (current) use of non-steroidal anti-inflammatories (NSAID): Secondary | ICD-10-CM | POA: Diagnosis not present

## 2017-04-21 DIAGNOSIS — E785 Hyperlipidemia, unspecified: Secondary | ICD-10-CM | POA: Diagnosis not present

## 2017-04-21 DIAGNOSIS — Z79899 Other long term (current) drug therapy: Secondary | ICD-10-CM | POA: Diagnosis not present

## 2017-04-21 HISTORY — DX: Dependence on renal dialysis: Z99.2

## 2017-04-21 HISTORY — DX: Kidney transplant failure: T86.12

## 2017-04-21 LAB — GLUCOSE, CAPILLARY: Glucose-Capillary: 221 mg/dL — ABNORMAL HIGH (ref 65–99)

## 2017-04-21 NOTE — Pre-Procedure Instructions (Signed)
Spoke with Dr. Smith Robert no surgical clearance needed for surgery on 04/23/17.

## 2017-04-22 DIAGNOSIS — Z992 Dependence on renal dialysis: Secondary | ICD-10-CM | POA: Diagnosis not present

## 2017-04-22 DIAGNOSIS — T8610 Unspecified complication of kidney transplant: Secondary | ICD-10-CM | POA: Diagnosis not present

## 2017-04-22 DIAGNOSIS — N2581 Secondary hyperparathyroidism of renal origin: Secondary | ICD-10-CM | POA: Diagnosis not present

## 2017-04-22 DIAGNOSIS — Z23 Encounter for immunization: Secondary | ICD-10-CM | POA: Diagnosis not present

## 2017-04-22 DIAGNOSIS — N186 End stage renal disease: Secondary | ICD-10-CM | POA: Diagnosis not present

## 2017-04-23 ENCOUNTER — Encounter (HOSPITAL_COMMUNITY)
Admission: RE | Disposition: A | Discharge status: Home / Self Care | Payer: Self-pay | Source: Ambulatory Visit | Attending: Surgery

## 2017-04-23 ENCOUNTER — Ambulatory Visit (HOSPITAL_COMMUNITY)
Admission: RE | Admit: 2017-04-23 | Discharge: 2017-04-23 | Disposition: A | Discharge status: Home / Self Care | Payer: Medicare Other | Source: Ambulatory Visit | Attending: Surgery | Admitting: Surgery

## 2017-04-23 ENCOUNTER — Encounter (HOSPITAL_COMMUNITY): Payer: Self-pay | Admitting: Emergency Medicine

## 2017-04-23 ENCOUNTER — Ambulatory Visit (HOSPITAL_COMMUNITY): Payer: Medicare Other | Admitting: Anesthesiology

## 2017-04-23 DIAGNOSIS — I120 Hypertensive chronic kidney disease with stage 5 chronic kidney disease or end stage renal disease: Secondary | ICD-10-CM | POA: Diagnosis not present

## 2017-04-23 DIAGNOSIS — E119 Type 2 diabetes mellitus without complications: Secondary | ICD-10-CM | POA: Insufficient documentation

## 2017-04-23 DIAGNOSIS — E785 Hyperlipidemia, unspecified: Secondary | ICD-10-CM | POA: Diagnosis not present

## 2017-04-23 DIAGNOSIS — I10 Essential (primary) hypertension: Secondary | ICD-10-CM | POA: Diagnosis not present

## 2017-04-23 DIAGNOSIS — Z79899 Other long term (current) drug therapy: Secondary | ICD-10-CM | POA: Insufficient documentation

## 2017-04-23 DIAGNOSIS — Z791 Long term (current) use of non-steroidal anti-inflammatories (NSAID): Secondary | ICD-10-CM | POA: Diagnosis not present

## 2017-04-23 DIAGNOSIS — A630 Anogenital (venereal) warts: Secondary | ICD-10-CM | POA: Diagnosis not present

## 2017-04-23 DIAGNOSIS — E1122 Type 2 diabetes mellitus with diabetic chronic kidney disease: Secondary | ICD-10-CM | POA: Diagnosis not present

## 2017-04-23 DIAGNOSIS — N186 End stage renal disease: Secondary | ICD-10-CM | POA: Diagnosis not present

## 2017-04-23 LAB — PROTIME-INR
INR: 0.98
Prothrombin Time: 12.9 seconds (ref 11.4–15.2)

## 2017-04-23 LAB — COMPREHENSIVE METABOLIC PANEL
ALBUMIN: 3.9 g/dL (ref 3.5–5.0)
ALK PHOS: 221 U/L — AB (ref 38–126)
ALT: 15 U/L (ref 14–54)
AST: 22 U/L (ref 15–41)
Anion gap: 13 (ref 5–15)
BUN: 36 mg/dL — AB (ref 6–20)
CALCIUM: 9.8 mg/dL (ref 8.9–10.3)
CHLORIDE: 100 mmol/L — AB (ref 101–111)
CO2: 27 mmol/L (ref 22–32)
CREATININE: 4.58 mg/dL — AB (ref 0.44–1.00)
GFR calc Af Amer: 11 mL/min — ABNORMAL LOW (ref >60)
GFR calc non Af Amer: 9 mL/min — ABNORMAL LOW (ref >60)
GLUCOSE: 111 mg/dL — AB (ref 65–99)
Potassium: 3.8 mmol/L (ref 3.5–5.1)
SODIUM: 140 mmol/L (ref 135–145)
Total Bilirubin: 0.6 mg/dL (ref 0.3–1.2)
Total Protein: 7.6 g/dL (ref 6.5–8.1)

## 2017-04-23 LAB — HEMOGLOBIN A1C
Hgb A1c MFr Bld: 4.7 % — ABNORMAL LOW (ref 4.8–5.6)
Mean Plasma Glucose: 88.19 mg/dL

## 2017-04-23 LAB — CBC
HCT: 42.9 % (ref 36.0–46.0)
HEMOGLOBIN: 13.8 g/dL (ref 12.0–15.0)
MCH: 30.7 pg (ref 26.0–34.0)
MCHC: 32.2 g/dL (ref 30.0–36.0)
MCV: 95.3 fL (ref 78.0–100.0)
PLATELETS: 202 10*3/uL (ref 150–400)
RBC: 4.5 MIL/uL (ref 3.87–5.11)
RDW: 16.3 % — ABNORMAL HIGH (ref 11.5–15.5)
WBC: 5.7 10*3/uL (ref 4.0–10.5)

## 2017-04-23 LAB — GLUCOSE, CAPILLARY
GLUCOSE-CAPILLARY: 106 mg/dL — AB (ref 65–99)
GLUCOSE-CAPILLARY: 185 mg/dL — AB (ref 65–99)

## 2017-04-23 SURGERY — EXAM UNDER ANESTHESIA
Anesthesia: General | Site: Anus

## 2017-04-23 MED ORDER — LIDOCAINE 2% (20 MG/ML) 5 ML SYRINGE
INTRAMUSCULAR | Status: DC | PRN
  Administered 2017-04-23: 100 mg via INTRAVENOUS

## 2017-04-23 MED ORDER — PROPOFOL 10 MG/ML IV BOLUS
INTRAVENOUS | Status: DC | PRN
  Administered 2017-04-23: 120 mg via INTRAVENOUS

## 2017-04-23 MED ORDER — LACTATED RINGERS IV SOLN
INTRAVENOUS | Status: DC | PRN
  Administered 2017-04-23: 09:00:00 via INTRAVENOUS

## 2017-04-23 MED ORDER — OXYCODONE HCL 5 MG PO TABS
ORAL_TABLET | ORAL | Status: AC
  Filled 2017-04-23: qty 1

## 2017-04-23 MED ORDER — CHLORHEXIDINE GLUCONATE CLOTH 2 % EX PADS: 6.0000 | MEDICATED_PAD | Freq: Once | CUTANEOUS | Status: DC

## 2017-04-23 MED ORDER — DEXAMETHASONE SODIUM PHOSPHATE 10 MG/ML IJ SOLN
INTRAMUSCULAR | Status: AC
  Filled 2017-04-23: qty 1

## 2017-04-23 MED ORDER — SUGAMMADEX SODIUM 200 MG/2ML IV SOLN
INTRAVENOUS | Status: DC | PRN
  Administered 2017-04-23: 120 mg via INTRAVENOUS

## 2017-04-23 MED ORDER — ROCURONIUM BROMIDE 50 MG/5ML IV SOSY
PREFILLED_SYRINGE | INTRAVENOUS | Status: AC
  Filled 2017-04-23: qty 5

## 2017-04-23 MED ORDER — MIDAZOLAM HCL 2 MG/2ML IJ SOLN
INTRAMUSCULAR | Status: AC
  Filled 2017-04-23: qty 2

## 2017-04-23 MED ORDER — DIBUCAINE 1 % RE OINT
TOPICAL_OINTMENT | RECTAL | Status: AC
  Filled 2017-04-23: qty 28

## 2017-04-23 MED ORDER — OXYCODONE HCL 5 MG PO TABS
5.0000 mg | ORAL_TABLET | Freq: Once | ORAL | Status: AC
  Administered 2017-04-23: 5 mg via ORAL

## 2017-04-23 MED ORDER — BUPIVACAINE-EPINEPHRINE 0.25% -1:200000 IJ SOLN
INTRAMUSCULAR | Status: DC | PRN
  Administered 2017-04-23: 20 mL

## 2017-04-23 MED ORDER — FENTANYL CITRATE (PF) 100 MCG/2ML IJ SOLN
INTRAMUSCULAR | Status: DC | PRN
  Administered 2017-04-23 (×4): 50 ug via INTRAVENOUS

## 2017-04-23 MED ORDER — LIDOCAINE 2% (20 MG/ML) 5 ML SYRINGE
INTRAMUSCULAR | Status: AC
  Filled 2017-04-23: qty 5

## 2017-04-23 MED ORDER — SUCCINYLCHOLINE CHLORIDE 200 MG/10ML IV SOSY
PREFILLED_SYRINGE | INTRAVENOUS | Status: AC
  Filled 2017-04-23: qty 10

## 2017-04-23 MED ORDER — SODIUM CHLORIDE 0.9 % IV SOLN
0.5000 mg/h | INTRAVENOUS | Status: DC
  Filled 2017-04-23: qty 5

## 2017-04-23 MED ORDER — OXYCODONE-ACETAMINOPHEN 10-325 MG PO TABS: 1.0000 | ORAL_TABLET | Freq: Four times a day (QID) | ORAL | 0 refills | Status: DC | PRN

## 2017-04-23 MED ORDER — CEFOTETAN DISODIUM-DEXTROSE 2-2.08 GM-%(50ML) IV SOLR
2.0000 g | INTRAVENOUS | Status: AC
  Administered 2017-04-23: 2 g via INTRAVENOUS

## 2017-04-23 MED ORDER — ONDANSETRON HCL 4 MG/2ML IJ SOLN
INTRAMUSCULAR | Status: AC
  Filled 2017-04-23: qty 2

## 2017-04-23 MED ORDER — SUGAMMADEX SODIUM 200 MG/2ML IV SOLN
INTRAVENOUS | Status: AC
  Filled 2017-04-23: qty 2

## 2017-04-23 MED ORDER — FENTANYL CITRATE (PF) 100 MCG/2ML IJ SOLN
INTRAMUSCULAR | Status: AC
  Filled 2017-04-23: qty 2

## 2017-04-23 MED ORDER — BUPIVACAINE LIPOSOME 1.3 % IJ SUSP
20.0000 mL | Freq: Once | INTRAMUSCULAR | Status: AC
  Administered 2017-04-23: 20 mL
  Filled 2017-04-23: qty 20

## 2017-04-23 MED ORDER — BUPIVACAINE-EPINEPHRINE 0.25% -1:200000 IJ SOLN
INTRAMUSCULAR | Status: AC
  Filled 2017-04-23: qty 1

## 2017-04-23 MED ORDER — DIBUCAINE 1 % RE OINT
TOPICAL_OINTMENT | RECTAL | Status: DC | PRN
  Administered 2017-04-23: 1 via RECTAL

## 2017-04-23 MED ORDER — ROCURONIUM BROMIDE 100 MG/10ML IV SOLN
INTRAVENOUS | Status: DC | PRN
  Administered 2017-04-23: 30 mg via INTRAVENOUS
  Administered 2017-04-23: 20 mg via INTRAVENOUS

## 2017-04-23 MED ORDER — CEFOTETAN DISODIUM-DEXTROSE 2-2.08 GM-%(50ML) IV SOLR
INTRAVENOUS | Status: AC
  Filled 2017-04-23: qty 50

## 2017-04-23 MED ORDER — DEXAMETHASONE SODIUM PHOSPHATE 10 MG/ML IJ SOLN
INTRAMUSCULAR | Status: DC | PRN
  Administered 2017-04-23: 10 mg via INTRAVENOUS

## 2017-04-23 MED ORDER — ONDANSETRON HCL 4 MG/2ML IJ SOLN
INTRAMUSCULAR | Status: DC | PRN
  Administered 2017-04-23: 4 mg via INTRAVENOUS

## 2017-04-23 MED ORDER — MIDAZOLAM HCL 5 MG/5ML IJ SOLN
INTRAMUSCULAR | Status: DC | PRN
  Administered 2017-04-23: 2 mg via INTRAVENOUS

## 2017-04-23 MED ORDER — 0.9 % SODIUM CHLORIDE (POUR BTL) OPTIME
TOPICAL | Status: DC | PRN
  Administered 2017-04-23: 1000 mL

## 2017-04-23 SURGICAL SUPPLY — 42 items
BLADE HEX COATED 2.75 (ELECTRODE) IMPLANT
BLADE SURG 15 STRL LF DISP TIS (BLADE) ×1 IMPLANT
BLADE SURG 15 STRL SS (BLADE) ×2
BLADE SURG SZ10 CARB STEEL (BLADE) IMPLANT
BRIEF STRETCH FOR OB PAD LRG (UNDERPADS AND DIAPERS) ×3 IMPLANT
COVER SURGICAL LIGHT HANDLE (MISCELLANEOUS) ×3 IMPLANT
DECANTER SPIKE VIAL GLASS SM (MISCELLANEOUS) ×3 IMPLANT
DRAPE BACK TABLE (DRAPES) IMPLANT
DRAPE SHEET LG 3/4 BI-LAMINATE (DRAPES) IMPLANT
DRAPE UTILITY XL STRL (DRAPES) ×3 IMPLANT
ELECT PENCIL ROCKER SW 15FT (MISCELLANEOUS) ×3 IMPLANT
ELECT REM PT RETURN 15FT ADLT (MISCELLANEOUS) ×3 IMPLANT
GAUZE SPONGE 4X4 12PLY STRL (GAUZE/BANDAGES/DRESSINGS) ×3 IMPLANT
GAUZE SPONGE 4X4 16PLY XRAY LF (GAUZE/BANDAGES/DRESSINGS) ×3 IMPLANT
GLOVE BIO SURGEON STRL SZ7 (GLOVE) ×3 IMPLANT
GLOVE BIOGEL PI IND STRL 7.0 (GLOVE) ×1 IMPLANT
GLOVE BIOGEL PI IND STRL 7.5 (GLOVE) ×1 IMPLANT
GLOVE BIOGEL PI INDICATOR 7.0 (GLOVE) ×2
GLOVE BIOGEL PI INDICATOR 7.5 (GLOVE) ×2
GOWN STRL REUS W/TWL LRG LVL3 (GOWN DISPOSABLE) IMPLANT
GOWN STRL REUS W/TWL XL LVL3 (GOWN DISPOSABLE) ×6 IMPLANT
KIT BASIN OR (CUSTOM PROCEDURE TRAY) ×3 IMPLANT
LUBRICANT JELLY K Y 4OZ (MISCELLANEOUS) ×3 IMPLANT
NDL SAFETY ECLIPSE 18X1.5 (NEEDLE) IMPLANT
NEEDLE HYPO 18GX1.5 SHARP (NEEDLE)
NEEDLE HYPO 25X1 1.5 SAFETY (NEEDLE) ×3 IMPLANT
PACK LITHOTOMY IV (CUSTOM PROCEDURE TRAY) IMPLANT
PAD ABD 7.5X8 STRL (GAUZE/BANDAGES/DRESSINGS) ×3 IMPLANT
SPONGE HEMORRHOID 8X3CM (HEMOSTASIS) ×3 IMPLANT
SPONGE SURGIFOAM ABS GEL 100 (HEMOSTASIS) IMPLANT
SUT CHROMIC 2 0 SH (SUTURE) IMPLANT
SUT CHROMIC 3 0 SH 27 (SUTURE) IMPLANT
SUT VIC AB 2-0 SH 27 (SUTURE)
SUT VIC AB 2-0 SH 27X BRD (SUTURE) IMPLANT
SUT VIC AB 3-0 SH 27 (SUTURE)
SUT VIC AB 3-0 SH 27XBRD (SUTURE) IMPLANT
SUT VIC AB 4-0 P-3 18XBRD (SUTURE) IMPLANT
SUT VIC AB 4-0 P3 18 (SUTURE)
SYR CONTROL 10ML LL (SYRINGE) ×3 IMPLANT
TOWEL OR 17X26 10 PK STRL BLUE (TOWEL DISPOSABLE) ×3 IMPLANT
TUBING SMOKE EVAC CO2 (TUBING) ×3 IMPLANT
YANKAUER SUCT BULB TIP 10FT TU (MISCELLANEOUS) ×3 IMPLANT

## 2017-04-23 NOTE — Anesthesia Preprocedure Evaluation (Signed)
Anesthesia Evaluation  Patient identified by MRN, date of birth, ID band Patient awake    Reviewed: Allergy & Precautions, H&P , NPO status , Patient's Chart, lab work & pertinent test results, reviewed documented beta blocker date and time   Airway Mallampati: II  TM Distance: >3 FB Neck ROM: full    Dental no notable dental hx.    Pulmonary shortness of breath, former smoker,    Pulmonary exam normal breath sounds clear to auscultation       Cardiovascular hypertension,  Rhythm:regular Rate:Normal     Neuro/Psych    GI/Hepatic   Endo/Other  diabetes, Type 2, Insulin DependentHyperthyroidism   Renal/GU ESRF and DialysisRenal disease     Musculoskeletal   Abdominal   Peds  Hematology  (+) anemia ,   Anesthesia Other Findings   Reproductive/Obstetrics                             Anesthesia Physical  Anesthesia Plan  ASA: III  Anesthesia Plan: General   Post-op Pain Management:    Induction: Intravenous  PONV Risk Score and Plan: 2 and Ondansetron, Dexamethasone and Treatment may vary due to age or medical condition  Airway Management Planned: LMA  Additional Equipment:   Intra-op Plan:   Post-operative Plan:   Informed Consent: I have reviewed the patients History and Physical, chart, labs and discussed the procedure including the risks, benefits and alternatives for the proposed anesthesia with the patient or authorized representative who has indicated his/her understanding and acceptance.   Dental Advisory Given  Plan Discussed with: Anesthesiologist, Surgeon and CRNA  Anesthesia Plan Comments:         Anesthesia Quick Evaluation

## 2017-04-23 NOTE — Op Note (Signed)
04/23/2017  11:24 AM  PATIENT:  Kimberly [name]  61 y.o. female  Patient Care Team: Marton Redwood, MD as PCP - General (Internal Medicine)  PRE-OPERATIVE DIAGNOSIS:  ANAL CONDYLOMA   POST-OPERATIVE DIAGNOSIS:  ANAL CONDYLOMA   PROCEDURE:   1. Exam under anesthesia 2. Excision and fulguration of perianal and perineal/vulvar condyloma  SURGEON:  Surgeon(s): Ileana Roup, MD  ASSISTANT: none   ANESTHESIA:   general  SPECIMEN:  1. Perianal condyloma 2. Vulvar condyloma  DISPOSITION OF SPECIMEN:  PATHOLOGY  COUNTS:  YES  PLAN OF CARE: Discharge to home after PACU  PATIENT DISPOSITION:  PACU - hemodynamically stable.   COMPLICATIONS: None  INDICATION: 83F referred to my clinic for evaluation of anal warts. Has previously been under the care of Dr. Greer Pickerel in our group. She has a history of HTN, HLD, DM, Kidney transplant (35yrs ago) which she has unfortunately rejected and is now back on dialysis 3x/week. She is still take Cellcept and prednisone. 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 Redmond Pulling. 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 Had a colonoscopy 02/2016 for diarrhea and had nonspecific colitis findings. All of that has resolved and she's back to having her normal BMs.  The anatomy and physiology of the anus and rectum were explained and diagrams utilized. The pathophysiology was elaborated. The procedure, material risk (including but not limited to pain, bleeding, infection, scarring, need for additional procedures, need for blood transfusion, incontinence of gas or feces, injury to surrounding structures, recurrence, heart attack, stroke, death), benefits and alternatives were explained. The patient's questions were answered to their satisfaction and they elected to proceed with surgery.  OR FINDINGS:  1. Extensive perianal condyloma and perineal condyloma - all removed with combination  of excision and fulguration 2. Vulvar condyloma removed with the same method  DESCRIPTION: The patient was identified in the preoperative holding area and taken to the OR. SCDs were placed.  General endotracheal anesthesia was induced without difficulty. The patient was then positioned over onto the OR table in prone jackknife position with buttocks sprayed with benzoin and gently taped apart.  The patient was then prepped and draped in usual sterile fashion.  A surgical timeout was performed indicating the correct patient, procedure, positioning and need for preoperative antibiotics.  A rectal block was performed using Exparel+Marcaine.    I began with a digital rectal exam which demonstrated no masses.  I then placed a Hill-Ferguson anoscope into the anal canal and evaluated this completely. All visible condyloma was addressed with a  Combination of fulguration and excision. The perianal skin also had extensive condyloma which was addressed in the same manner. The anal sphincter was protected throughout and no muscle was divided. The vulva was inspected and condyloma noted posteriorly - this was addressed with a combination of excision and fulguration. The wounds were irrigated and hemostasis verified. The wounds were covered in bacitracin and a piece of proctofoam soaked in Exparel + bacitracin was introduced into the anal canal. A clean dressing was applied. The patient was then transferred back to a stretcher for extubation and transported to PACU in satisfactory condition.

## 2017-04-23 NOTE — Discharge Instructions (Signed)
ANORECTAL SURGERY: POST OP INSTRUCTIONS  1. DIET: Follow a light bland diet the first 24 hours after arrival home, such as soup, liquids, crackers, etc.  Be sure to include lots of fluids daily.  Avoid fast food or heavy meals as your are more likely to get nauseated.  Eat a low fat diet the next few days after surgery.    2. Take your usually prescribed home medications unless otherwise directed.  3. PAIN CONTROL: a. It is helpful to take an over-the-counter pain medication regularly for the first few days/weeks.  Choose from the following that works best for you: i. Ibuprofen (Advil, etc) Three 200mg  tabs every 6 hours as needed. ii. Acetaminophen (Tylenol, etc) 500-650mg  every 6 hours as needed iii. NOTE: You may take both of these medications together - most patients find it most helpful when alternating between the two (i.e. Ibuprofen at 6am, tylenol at 9am, ibuprofen at 12pm ...) b. A  prescription for pain medication may have been prescribed for you at discharge.  Take your pain medication as prescribed.  i. If you are having problems/concerns with the prescription medicine, please call us for further advice.  4. Avoid getting constipated.  Between the surgery and the pain medications, it is common to experience some constipation.  Increasing fluid intake (64oz of water per day) and taking a fiber supplement (such as Metamucil, Citrucel, FiberCon) 1-2 times a day regularly will usually help prevent this problem from occurring.  Take Miralax (over the counter) 1-2x/day while taking a narcotic pain medication. If no bowel movement after 48hours, you may additionally take a laxative like a bottle of Milk of Magnesia which can be purchased over the counter. Avoid enemas if possible as these are often painful.   5. Watch out for diarrhea.  If you have many loose bowel movements, simplify your diet to bland foods.  Stop any stool softeners and decrease your fiber supplement. If this worsens or does  not improve, please call us.  6. Wash / shower every day.  If you were discharged with a dressing, you may remove this the day after your surgery. You may shower normally, getting soap/water on your wound, particularly after bowel movements. A piece of foam soaked in a topical anesthetic was intentionally left in the anal canal and will pass with your first bowel movement. This is nothing to be concerned about.  7. Soaking in a warm bath filled a couple inches ("Sitz bath") is a great way to clean the area after a bowel movement and many patients find it is a way to soothe the area.  8. ACTIVITIES as tolerated:   a. You may resume regular (light) daily activities beginning the next day--such as daily self-care, walking, climbing stairs--gradually increasing activities as tolerated.  If you can walk 30 minutes without difficulty, it is safe to try more intense activity such as jogging, treadmill, bicycling, low-impact aerobics, etc. b. Refrain from any heavy lifting or straining for the first 2 weeks after your procedure, particularly if your surgery was for hemorrhoids. c. Avoid activities that make your pain worse d. You may drive when you are no longer taking prescription pain medication, you can comfortably wear a seatbelt, and you can safely maneuver your car and apply brakes.  9. FOLLOW UP in our office a. Please call [facility] at [phone] to set up an appointment to see your surgeon in the office for a follow-up appointment approximately 2 weeks after your surgery. b. Make sure that  you call for this appointment the day you arrive home to insure a convenient appointment time.  9. If you have disability or family leave forms that need to be completed, you may have them completed by your primary care physician's office; for return to work instructions, please ask our office staff and they will be happy to assist you in obtaining this documentation   When to call us [phone]: 1. Poor pain  control 2. Reactions / problems with new medications (rash/itching, etc)  3. Fever over 101.5 F (38.5 C) 4. Inability to urinate 5. Nausea/vomiting 6. Worsening swelling or bruising 7. Continued bleeding from incision. 8. Increased pain, redness, or drainage from the incision  The clinic staff is available to answer your questions during regular business hours (8:30am-5pm).  Please dont hesitate to call and ask to speak to one of our nurses for clinical concerns.   A surgeon from [facility] is always on call at the hospitals   If you have a medical emergency, go to the nearest emergency room or call 911.   [facility], [place], [place], [place], [place]  [id number] ? MAIN: [phone] FAX [phone] [email or web address]

## 2017-04-23 NOTE — Progress Notes (Signed)
Pt c/o significantly greater pain than her previous procedure. Pain 9/10 in intensity. She requests further analgesia before discharge. Paged Dr. Dema Severin. Apparently this procedure was much more extensive than her previous procedure and therefore more discomfort is to be expected. Received an order for IV Dilaudid x1. Spoke with pt and explained the above. She wishes to be discharged home and refused the IV Dilaudid. Discharge instructions reviewed and signed with pt and husband. Pt denies further questions or concerns at this time. Pt states pain level is tolerable for discharge. Coolidge Breeze, RN 04/23/2017

## 2017-04-23 NOTE — Transfer of Care (Signed)
Immediate Anesthesia Transfer of Care Note  Patient: Kimberly [name]  Procedure(s) Performed: [name] UNDER ANESTHESIA, EXCISION AND FULGURATION OF ANAL PERINEAL CONDYLOMA (N/A Anus)  Patient Location: PACU  Anesthesia Type:General  Level of Consciousness: sedated  Airway & Oxygen Therapy: Patient Spontanous Breathing and Patient connected to face mask oxygen  Post-op Assessment: Report given to RN and Post -op Vital signs reviewed and stable  Post vital signs: Reviewed and stable  Last Vitals:  Vitals:   04/23/17 0813  BP: 124/65  Pulse: 74  Resp: 16  Temp: 36.7 C  SpO2: 97%    Last Pain:  Vitals:   04/23/17 0813  TempSrc: Oral      Patients Stated Pain Goal: 3 (65/68/12 7517)  Complications: No apparent anesthesia complications

## 2017-04-23 NOTE — Anesthesia Procedure Notes (Addendum)
Procedure Name: Intubation Date/Time: 04/23/2017 10:19 AM Performed by: Lind Covert Pre-anesthesia Checklist: Patient identified, Emergency Drugs available, Suction available and Patient being monitored Patient Re-evaluated:Patient Re-evaluated prior to induction Oxygen Delivery Method: Circle system utilized Preoxygenation: Pre-oxygenation with 100% oxygen Induction Type: IV induction Ventilation: Mask ventilation without difficulty Laryngoscope Size: Mac and 4 Grade View: Grade II Tube size: 7.0 mm Number of attempts: 1 Airway Equipment and Method: Stylet Placement Confirmation: ETT inserted through vocal cords under direct vision,  positive ETCO2 and breath sounds checked- equal and bilateral Secured at: 22 cm Tube secured with: Tape Dental Injury: Teeth and Oropharynx as per pre-operative assessment

## 2017-04-23 NOTE — H&P (Signed)
CC: Here for surgery  HPI Patient words: 12F referred to my clinic for evaluation of anal warts. Has previously been under the care of Dr. Greer Pickerel in our group. She has a history of HTN, HLD, DM, Kidney transplant (59yrs ago) which she has unfortunately rejected and is now back on dialysis 3x/week. She is still take Cellcept and prednisone. 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 Redmond Pulling. 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 Had a colonoscopy 02/2016 for diarrhea and had nonspecific colitis findings. All of that has resolved and she's back to having her normal BMs.   Review of Systems General Not Present- Anorexia, Appetite Loss, Chills, Weight Gain and Weight Loss. Skin Present- Bruising, New Lesions and Pruritus (perianal). Respiratory Not Present- Chronic Cough, Difficulty Breathing, Difficulty Breathing on Exertion and Dyspnea. Cardiovascular Not Present- Chest Pain and Edema. Gastrointestinal Present- Pain with Bowel Movement. Not Present- Abdominal Pain, Black, Tarry Stool, Bloody Stool, Change in Bowel Habits, Chronic diarrhea, Constipation, Diarrhea and Rectal Bleeding. Musculoskeletal Not Present- Muscle Cramps and Muscle Pain. Psychiatric Not Present- Anxiety. Endocrine Not Present- Appetite Changes. Hematology Not Present- Abnormal Bleeding.  Vitals:   04/23/17 0813 04/23/17 0854  BP: 124/65   Pulse: 74   Resp: 16   Temp: 98.1 F (36.7 C)   TempSrc: Oral   SpO2: 97%   Weight:  60.3 kg (133 lb)  Height:  5\' 6"  (1.676 m)      Physical Exam General  Mental Status - Alert. General Appearance - Cooperative, Not in acute distress. Gait - Normal.  Chest and Lung Exam Note:  Normal work of breathing  Cardiovascular Note:  RRR  Abdomen Note:  soft, NT/ND, no palpable hepatosplenomegaly  Rectal Note:  Inspection: Circumferential perianal condyloma extending from anal canal to ~3cm from  anal verge with associated redundant perianal skin. Small condyloma on right posterior labia. DRE: No palpable rectal masses Anoscopy: Scattered condyloma in anal canal; grade I/II internal hemorrhoids right anterior, right posterior.  Neuropsychiatric Mental status exam performed with findings of - Oriented X3 with appropriate mood and affect. Judgment and Insight - insight is appropriate concerning matters relevant to self and the patient displays appropriate judgment regarding every day activities.  Lymphatic Note:  No pitting edema  Assessment & Plan ANAL CONDYLOMA (A63.0) Impression: She has recurrence of her condyloma.  Follow-up with PCP for clearance -OR today for EUA with excision + fulguration of perianal and perineal/labia condyloma; all other indicated procedures -The planned procedure, material risks (including but not limited to pain, bleeding, infection, scarring, need for additional procedures, recurrence of warts, heart attack, stroke, death) benefits and alternatives were described at length. Her questions were answered to her satisfaction and she elected to proceed with surgery  Sharon Mt. Dema Severin, M.D. General and Colorectal Surgery [facility]

## 2017-04-23 NOTE — Anesthesia Postprocedure Evaluation (Signed)
Anesthesia Post Note  Patient: RICKIE GUTIERRES  Procedure(s) Performed: [name] UNDER ANESTHESIA, EXCISION AND FULGURATION OF ANAL PERINEAL CONDYLOMA (N/A Anus)     Patient location during evaluation: PACU Anesthesia Type: General Level of consciousness: awake and alert Pain management: pain level controlled Vital Signs Assessment: post-procedure vital signs reviewed and stable Respiratory status: spontaneous breathing, nonlabored ventilation, respiratory function stable and patient connected to nasal cannula oxygen Cardiovascular status: blood pressure returned to baseline and stable Postop Assessment: no apparent nausea or vomiting Anesthetic complications: no    Last Vitals:  Vitals:   04/23/17 1228 04/23/17 1334  BP: (!) 149/60 139/61  Pulse: 65 68  Resp: 14 18  Temp: (!) 36.4 C 36.8 C  SpO2: 94% 99%    Last Pain:  Vitals:   04/23/17 1334  TempSrc: Oral  PainSc:                  Riccardo Dubin

## 2017-04-24 DIAGNOSIS — D509 Iron deficiency anemia, unspecified: Secondary | ICD-10-CM | POA: Diagnosis not present

## 2017-04-24 DIAGNOSIS — D631 Anemia in chronic kidney disease: Secondary | ICD-10-CM | POA: Diagnosis not present

## 2017-04-24 DIAGNOSIS — Z23 Encounter for immunization: Secondary | ICD-10-CM | POA: Diagnosis not present

## 2017-04-24 DIAGNOSIS — N186 End stage renal disease: Secondary | ICD-10-CM | POA: Diagnosis not present

## 2017-04-24 DIAGNOSIS — N2581 Secondary hyperparathyroidism of renal origin: Secondary | ICD-10-CM | POA: Diagnosis not present

## 2017-04-27 DIAGNOSIS — Z23 Encounter for immunization: Secondary | ICD-10-CM | POA: Diagnosis not present

## 2017-04-27 DIAGNOSIS — D631 Anemia in chronic kidney disease: Secondary | ICD-10-CM | POA: Diagnosis not present

## 2017-04-27 DIAGNOSIS — D509 Iron deficiency anemia, unspecified: Secondary | ICD-10-CM | POA: Diagnosis not present

## 2017-04-27 DIAGNOSIS — N2581 Secondary hyperparathyroidism of renal origin: Secondary | ICD-10-CM | POA: Diagnosis not present

## 2017-04-27 DIAGNOSIS — N186 End stage renal disease: Secondary | ICD-10-CM | POA: Diagnosis not present

## 2017-04-29 DIAGNOSIS — D509 Iron deficiency anemia, unspecified: Secondary | ICD-10-CM | POA: Diagnosis not present

## 2017-04-29 DIAGNOSIS — D631 Anemia in chronic kidney disease: Secondary | ICD-10-CM | POA: Diagnosis not present

## 2017-04-29 DIAGNOSIS — N186 End stage renal disease: Secondary | ICD-10-CM | POA: Diagnosis not present

## 2017-04-29 DIAGNOSIS — Z23 Encounter for immunization: Secondary | ICD-10-CM | POA: Diagnosis not present

## 2017-04-29 DIAGNOSIS — N2581 Secondary hyperparathyroidism of renal origin: Secondary | ICD-10-CM | POA: Diagnosis not present

## 2017-05-01 DIAGNOSIS — D509 Iron deficiency anemia, unspecified: Secondary | ICD-10-CM | POA: Diagnosis not present

## 2017-05-01 DIAGNOSIS — N2581 Secondary hyperparathyroidism of renal origin: Secondary | ICD-10-CM | POA: Diagnosis not present

## 2017-05-01 DIAGNOSIS — D631 Anemia in chronic kidney disease: Secondary | ICD-10-CM | POA: Diagnosis not present

## 2017-05-01 DIAGNOSIS — N186 End stage renal disease: Secondary | ICD-10-CM | POA: Diagnosis not present

## 2017-05-01 DIAGNOSIS — Z23 Encounter for immunization: Secondary | ICD-10-CM | POA: Diagnosis not present

## 2017-05-04 DIAGNOSIS — D631 Anemia in chronic kidney disease: Secondary | ICD-10-CM | POA: Diagnosis not present

## 2017-05-04 DIAGNOSIS — N2581 Secondary hyperparathyroidism of renal origin: Secondary | ICD-10-CM | POA: Diagnosis not present

## 2017-05-04 DIAGNOSIS — D509 Iron deficiency anemia, unspecified: Secondary | ICD-10-CM | POA: Diagnosis not present

## 2017-05-04 DIAGNOSIS — N186 End stage renal disease: Secondary | ICD-10-CM | POA: Diagnosis not present

## 2017-05-04 DIAGNOSIS — Z23 Encounter for immunization: Secondary | ICD-10-CM | POA: Diagnosis not present

## 2017-05-06 DIAGNOSIS — D509 Iron deficiency anemia, unspecified: Secondary | ICD-10-CM | POA: Diagnosis not present

## 2017-05-06 DIAGNOSIS — N186 End stage renal disease: Secondary | ICD-10-CM | POA: Diagnosis not present

## 2017-05-06 DIAGNOSIS — N2581 Secondary hyperparathyroidism of renal origin: Secondary | ICD-10-CM | POA: Diagnosis not present

## 2017-05-06 DIAGNOSIS — Z23 Encounter for immunization: Secondary | ICD-10-CM | POA: Diagnosis not present

## 2017-05-06 DIAGNOSIS — D631 Anemia in chronic kidney disease: Secondary | ICD-10-CM | POA: Diagnosis not present

## 2017-05-08 DIAGNOSIS — D631 Anemia in chronic kidney disease: Secondary | ICD-10-CM | POA: Diagnosis not present

## 2017-05-08 DIAGNOSIS — N186 End stage renal disease: Secondary | ICD-10-CM | POA: Diagnosis not present

## 2017-05-08 DIAGNOSIS — D509 Iron deficiency anemia, unspecified: Secondary | ICD-10-CM | POA: Diagnosis not present

## 2017-05-08 DIAGNOSIS — N2581 Secondary hyperparathyroidism of renal origin: Secondary | ICD-10-CM | POA: Diagnosis not present

## 2017-05-08 DIAGNOSIS — Z23 Encounter for immunization: Secondary | ICD-10-CM | POA: Diagnosis not present

## 2017-05-11 DIAGNOSIS — N186 End stage renal disease: Secondary | ICD-10-CM | POA: Diagnosis not present

## 2017-05-11 DIAGNOSIS — D631 Anemia in chronic kidney disease: Secondary | ICD-10-CM | POA: Diagnosis not present

## 2017-05-11 DIAGNOSIS — N2581 Secondary hyperparathyroidism of renal origin: Secondary | ICD-10-CM | POA: Diagnosis not present

## 2017-05-11 DIAGNOSIS — Z23 Encounter for immunization: Secondary | ICD-10-CM | POA: Diagnosis not present

## 2017-05-11 DIAGNOSIS — D509 Iron deficiency anemia, unspecified: Secondary | ICD-10-CM | POA: Diagnosis not present

## 2017-05-13 DIAGNOSIS — N186 End stage renal disease: Secondary | ICD-10-CM | POA: Diagnosis not present

## 2017-05-13 DIAGNOSIS — N2581 Secondary hyperparathyroidism of renal origin: Secondary | ICD-10-CM | POA: Diagnosis not present

## 2017-05-13 DIAGNOSIS — D631 Anemia in chronic kidney disease: Secondary | ICD-10-CM | POA: Diagnosis not present

## 2017-05-13 DIAGNOSIS — Z23 Encounter for immunization: Secondary | ICD-10-CM | POA: Diagnosis not present

## 2017-05-13 DIAGNOSIS — D509 Iron deficiency anemia, unspecified: Secondary | ICD-10-CM | POA: Diagnosis not present

## 2017-05-15 DIAGNOSIS — N186 End stage renal disease: Secondary | ICD-10-CM | POA: Diagnosis not present

## 2017-05-15 DIAGNOSIS — N2581 Secondary hyperparathyroidism of renal origin: Secondary | ICD-10-CM | POA: Diagnosis not present

## 2017-05-15 DIAGNOSIS — D631 Anemia in chronic kidney disease: Secondary | ICD-10-CM | POA: Diagnosis not present

## 2017-05-15 DIAGNOSIS — D509 Iron deficiency anemia, unspecified: Secondary | ICD-10-CM | POA: Diagnosis not present

## 2017-05-15 DIAGNOSIS — Z23 Encounter for immunization: Secondary | ICD-10-CM | POA: Diagnosis not present

## 2017-05-18 DIAGNOSIS — N186 End stage renal disease: Secondary | ICD-10-CM | POA: Diagnosis not present

## 2017-05-18 DIAGNOSIS — D509 Iron deficiency anemia, unspecified: Secondary | ICD-10-CM | POA: Diagnosis not present

## 2017-05-18 DIAGNOSIS — Z23 Encounter for immunization: Secondary | ICD-10-CM | POA: Diagnosis not present

## 2017-05-18 DIAGNOSIS — N2581 Secondary hyperparathyroidism of renal origin: Secondary | ICD-10-CM | POA: Diagnosis not present

## 2017-05-18 DIAGNOSIS — D631 Anemia in chronic kidney disease: Secondary | ICD-10-CM | POA: Diagnosis not present

## 2017-05-20 DIAGNOSIS — N2581 Secondary hyperparathyroidism of renal origin: Secondary | ICD-10-CM | POA: Diagnosis not present

## 2017-05-20 DIAGNOSIS — N186 End stage renal disease: Secondary | ICD-10-CM | POA: Diagnosis not present

## 2017-05-20 DIAGNOSIS — D509 Iron deficiency anemia, unspecified: Secondary | ICD-10-CM | POA: Diagnosis not present

## 2017-05-20 DIAGNOSIS — D631 Anemia in chronic kidney disease: Secondary | ICD-10-CM | POA: Diagnosis not present

## 2017-05-20 DIAGNOSIS — Z23 Encounter for immunization: Secondary | ICD-10-CM | POA: Diagnosis not present

## 2017-05-22 DIAGNOSIS — N186 End stage renal disease: Secondary | ICD-10-CM | POA: Diagnosis not present

## 2017-05-22 DIAGNOSIS — N2581 Secondary hyperparathyroidism of renal origin: Secondary | ICD-10-CM | POA: Diagnosis not present

## 2017-05-22 DIAGNOSIS — Z23 Encounter for immunization: Secondary | ICD-10-CM | POA: Diagnosis not present

## 2017-05-22 DIAGNOSIS — Z992 Dependence on renal dialysis: Secondary | ICD-10-CM | POA: Diagnosis not present

## 2017-05-22 DIAGNOSIS — D509 Iron deficiency anemia, unspecified: Secondary | ICD-10-CM | POA: Diagnosis not present

## 2017-05-22 DIAGNOSIS — D631 Anemia in chronic kidney disease: Secondary | ICD-10-CM | POA: Diagnosis not present

## 2017-05-22 DIAGNOSIS — T8610 Unspecified complication of kidney transplant: Secondary | ICD-10-CM | POA: Diagnosis not present

## 2017-05-25 DIAGNOSIS — D509 Iron deficiency anemia, unspecified: Secondary | ICD-10-CM | POA: Diagnosis not present

## 2017-05-25 DIAGNOSIS — N186 End stage renal disease: Secondary | ICD-10-CM | POA: Diagnosis not present

## 2017-05-25 DIAGNOSIS — N2581 Secondary hyperparathyroidism of renal origin: Secondary | ICD-10-CM | POA: Diagnosis not present

## 2017-05-26 DIAGNOSIS — M859 Disorder of bone density and structure, unspecified: Secondary | ICD-10-CM | POA: Diagnosis not present

## 2017-05-26 DIAGNOSIS — E7849 Other hyperlipidemia: Secondary | ICD-10-CM | POA: Diagnosis not present

## 2017-05-26 DIAGNOSIS — I10 Essential (primary) hypertension: Secondary | ICD-10-CM | POA: Diagnosis not present

## 2017-05-26 DIAGNOSIS — R946 Abnormal results of thyroid function studies: Secondary | ICD-10-CM | POA: Diagnosis not present

## 2017-05-26 DIAGNOSIS — M1000 Idiopathic gout, unspecified site: Secondary | ICD-10-CM | POA: Diagnosis not present

## 2017-05-26 DIAGNOSIS — E1129 Type 2 diabetes mellitus with other diabetic kidney complication: Secondary | ICD-10-CM | POA: Diagnosis not present

## 2017-05-27 DIAGNOSIS — N2581 Secondary hyperparathyroidism of renal origin: Secondary | ICD-10-CM | POA: Diagnosis not present

## 2017-05-27 DIAGNOSIS — E7849 Other hyperlipidemia: Secondary | ICD-10-CM | POA: Diagnosis not present

## 2017-05-27 DIAGNOSIS — D638 Anemia in other chronic diseases classified elsewhere: Secondary | ICD-10-CM | POA: Diagnosis not present

## 2017-05-27 DIAGNOSIS — A630 Anogenital (venereal) warts: Secondary | ICD-10-CM | POA: Diagnosis not present

## 2017-05-27 DIAGNOSIS — Z6823 Body mass index (BMI) 23.0-23.9, adult: Secondary | ICD-10-CM | POA: Diagnosis not present

## 2017-05-27 DIAGNOSIS — R82998 Other abnormal findings in urine: Secondary | ICD-10-CM | POA: Diagnosis not present

## 2017-05-27 DIAGNOSIS — I10 Essential (primary) hypertension: Secondary | ICD-10-CM | POA: Diagnosis not present

## 2017-05-27 DIAGNOSIS — R748 Abnormal levels of other serum enzymes: Secondary | ICD-10-CM | POA: Diagnosis not present

## 2017-05-27 DIAGNOSIS — E1139 Type 2 diabetes mellitus with other diabetic ophthalmic complication: Secondary | ICD-10-CM | POA: Diagnosis not present

## 2017-05-27 DIAGNOSIS — D509 Iron deficiency anemia, unspecified: Secondary | ICD-10-CM | POA: Diagnosis not present

## 2017-05-27 DIAGNOSIS — Z0000 Encounter for general adult medical examination without abnormal findings: Secondary | ICD-10-CM | POA: Diagnosis not present

## 2017-05-27 DIAGNOSIS — E1129 Type 2 diabetes mellitus with other diabetic kidney complication: Secondary | ICD-10-CM | POA: Diagnosis not present

## 2017-05-27 DIAGNOSIS — Z940 Kidney transplant status: Secondary | ICD-10-CM | POA: Diagnosis not present

## 2017-05-27 DIAGNOSIS — E042 Nontoxic multinodular goiter: Secondary | ICD-10-CM | POA: Diagnosis not present

## 2017-05-27 DIAGNOSIS — N186 End stage renal disease: Secondary | ICD-10-CM | POA: Diagnosis not present

## 2017-05-29 DIAGNOSIS — N186 End stage renal disease: Secondary | ICD-10-CM | POA: Diagnosis not present

## 2017-05-29 DIAGNOSIS — D509 Iron deficiency anemia, unspecified: Secondary | ICD-10-CM | POA: Diagnosis not present

## 2017-05-29 DIAGNOSIS — N2581 Secondary hyperparathyroidism of renal origin: Secondary | ICD-10-CM | POA: Diagnosis not present

## 2017-06-01 DIAGNOSIS — N2581 Secondary hyperparathyroidism of renal origin: Secondary | ICD-10-CM | POA: Diagnosis not present

## 2017-06-01 DIAGNOSIS — D509 Iron deficiency anemia, unspecified: Secondary | ICD-10-CM | POA: Diagnosis not present

## 2017-06-01 DIAGNOSIS — N186 End stage renal disease: Secondary | ICD-10-CM | POA: Diagnosis not present

## 2017-06-03 DIAGNOSIS — D509 Iron deficiency anemia, unspecified: Secondary | ICD-10-CM | POA: Diagnosis not present

## 2017-06-03 DIAGNOSIS — N186 End stage renal disease: Secondary | ICD-10-CM | POA: Diagnosis not present

## 2017-06-03 DIAGNOSIS — N2581 Secondary hyperparathyroidism of renal origin: Secondary | ICD-10-CM | POA: Diagnosis not present

## 2017-06-05 DIAGNOSIS — N2581 Secondary hyperparathyroidism of renal origin: Secondary | ICD-10-CM | POA: Diagnosis not present

## 2017-06-05 DIAGNOSIS — D509 Iron deficiency anemia, unspecified: Secondary | ICD-10-CM | POA: Diagnosis not present

## 2017-06-05 DIAGNOSIS — N186 End stage renal disease: Secondary | ICD-10-CM | POA: Diagnosis not present

## 2017-06-08 DIAGNOSIS — N2581 Secondary hyperparathyroidism of renal origin: Secondary | ICD-10-CM | POA: Diagnosis not present

## 2017-06-08 DIAGNOSIS — N186 End stage renal disease: Secondary | ICD-10-CM | POA: Diagnosis not present

## 2017-06-08 DIAGNOSIS — D509 Iron deficiency anemia, unspecified: Secondary | ICD-10-CM | POA: Diagnosis not present

## 2017-06-09 DIAGNOSIS — N186 End stage renal disease: Secondary | ICD-10-CM | POA: Diagnosis not present

## 2017-06-09 DIAGNOSIS — Z23 Encounter for immunization: Secondary | ICD-10-CM | POA: Diagnosis not present

## 2017-06-09 DIAGNOSIS — N2581 Secondary hyperparathyroidism of renal origin: Secondary | ICD-10-CM | POA: Diagnosis not present

## 2017-06-10 DIAGNOSIS — N186 End stage renal disease: Secondary | ICD-10-CM | POA: Diagnosis not present

## 2017-06-10 DIAGNOSIS — N2581 Secondary hyperparathyroidism of renal origin: Secondary | ICD-10-CM | POA: Diagnosis not present

## 2017-06-10 DIAGNOSIS — Z992 Dependence on renal dialysis: Secondary | ICD-10-CM | POA: Diagnosis not present

## 2017-06-10 DIAGNOSIS — D509 Iron deficiency anemia, unspecified: Secondary | ICD-10-CM | POA: Diagnosis not present

## 2017-06-10 DIAGNOSIS — Z01810 Encounter for preprocedural cardiovascular examination: Secondary | ICD-10-CM | POA: Diagnosis not present

## 2017-06-12 DIAGNOSIS — D509 Iron deficiency anemia, unspecified: Secondary | ICD-10-CM | POA: Diagnosis not present

## 2017-06-12 DIAGNOSIS — N186 End stage renal disease: Secondary | ICD-10-CM | POA: Diagnosis not present

## 2017-06-12 DIAGNOSIS — N2581 Secondary hyperparathyroidism of renal origin: Secondary | ICD-10-CM | POA: Diagnosis not present

## 2017-06-14 DIAGNOSIS — D509 Iron deficiency anemia, unspecified: Secondary | ICD-10-CM | POA: Diagnosis not present

## 2017-06-14 DIAGNOSIS — N2581 Secondary hyperparathyroidism of renal origin: Secondary | ICD-10-CM | POA: Diagnosis not present

## 2017-06-14 DIAGNOSIS — N186 End stage renal disease: Secondary | ICD-10-CM | POA: Diagnosis not present

## 2017-06-17 DIAGNOSIS — D509 Iron deficiency anemia, unspecified: Secondary | ICD-10-CM | POA: Diagnosis not present

## 2017-06-17 DIAGNOSIS — N2581 Secondary hyperparathyroidism of renal origin: Secondary | ICD-10-CM | POA: Diagnosis not present

## 2017-06-17 DIAGNOSIS — N186 End stage renal disease: Secondary | ICD-10-CM | POA: Diagnosis not present

## 2017-06-19 DIAGNOSIS — D509 Iron deficiency anemia, unspecified: Secondary | ICD-10-CM | POA: Diagnosis not present

## 2017-06-19 DIAGNOSIS — N2581 Secondary hyperparathyroidism of renal origin: Secondary | ICD-10-CM | POA: Diagnosis not present

## 2017-06-19 DIAGNOSIS — N186 End stage renal disease: Secondary | ICD-10-CM | POA: Diagnosis not present

## 2017-06-22 DIAGNOSIS — N2581 Secondary hyperparathyroidism of renal origin: Secondary | ICD-10-CM | POA: Diagnosis not present

## 2017-06-22 DIAGNOSIS — N186 End stage renal disease: Secondary | ICD-10-CM | POA: Diagnosis not present

## 2017-06-22 DIAGNOSIS — D509 Iron deficiency anemia, unspecified: Secondary | ICD-10-CM | POA: Diagnosis not present

## 2017-06-22 DIAGNOSIS — Z992 Dependence on renal dialysis: Secondary | ICD-10-CM | POA: Diagnosis not present

## 2017-06-22 DIAGNOSIS — T8610 Unspecified complication of kidney transplant: Secondary | ICD-10-CM | POA: Diagnosis not present

## 2017-06-23 DIAGNOSIS — N186 End stage renal disease: Secondary | ICD-10-CM | POA: Diagnosis not present

## 2017-06-23 DIAGNOSIS — Z23 Encounter for immunization: Secondary | ICD-10-CM | POA: Diagnosis not present

## 2017-06-23 DIAGNOSIS — N2581 Secondary hyperparathyroidism of renal origin: Secondary | ICD-10-CM | POA: Diagnosis not present

## 2017-06-23 DIAGNOSIS — Z4931 Encounter for adequacy testing for hemodialysis: Secondary | ICD-10-CM | POA: Diagnosis not present

## 2017-06-25 DIAGNOSIS — Z4931 Encounter for adequacy testing for hemodialysis: Secondary | ICD-10-CM | POA: Diagnosis not present

## 2017-06-25 DIAGNOSIS — N186 End stage renal disease: Secondary | ICD-10-CM | POA: Diagnosis not present

## 2017-06-25 DIAGNOSIS — Z23 Encounter for immunization: Secondary | ICD-10-CM | POA: Diagnosis not present

## 2017-06-25 DIAGNOSIS — N2581 Secondary hyperparathyroidism of renal origin: Secondary | ICD-10-CM | POA: Diagnosis not present

## 2017-06-25 DIAGNOSIS — E1129 Type 2 diabetes mellitus with other diabetic kidney complication: Secondary | ICD-10-CM | POA: Diagnosis not present

## 2017-06-27 DIAGNOSIS — Z23 Encounter for immunization: Secondary | ICD-10-CM | POA: Diagnosis not present

## 2017-06-27 DIAGNOSIS — N2581 Secondary hyperparathyroidism of renal origin: Secondary | ICD-10-CM | POA: Diagnosis not present

## 2017-06-27 DIAGNOSIS — N186 End stage renal disease: Secondary | ICD-10-CM | POA: Diagnosis not present

## 2017-06-27 DIAGNOSIS — Z4931 Encounter for adequacy testing for hemodialysis: Secondary | ICD-10-CM | POA: Diagnosis not present

## 2017-06-30 DIAGNOSIS — N186 End stage renal disease: Secondary | ICD-10-CM | POA: Diagnosis not present

## 2017-06-30 DIAGNOSIS — Z4931 Encounter for adequacy testing for hemodialysis: Secondary | ICD-10-CM | POA: Diagnosis not present

## 2017-06-30 DIAGNOSIS — N2581 Secondary hyperparathyroidism of renal origin: Secondary | ICD-10-CM | POA: Diagnosis not present

## 2017-06-30 DIAGNOSIS — Z23 Encounter for immunization: Secondary | ICD-10-CM | POA: Diagnosis not present

## 2017-07-02 DIAGNOSIS — N186 End stage renal disease: Secondary | ICD-10-CM | POA: Diagnosis not present

## 2017-07-02 DIAGNOSIS — Z4931 Encounter for adequacy testing for hemodialysis: Secondary | ICD-10-CM | POA: Diagnosis not present

## 2017-07-02 DIAGNOSIS — Z23 Encounter for immunization: Secondary | ICD-10-CM | POA: Diagnosis not present

## 2017-07-02 DIAGNOSIS — N2581 Secondary hyperparathyroidism of renal origin: Secondary | ICD-10-CM | POA: Diagnosis not present

## 2017-07-05 DIAGNOSIS — Z4931 Encounter for adequacy testing for hemodialysis: Secondary | ICD-10-CM | POA: Diagnosis not present

## 2017-07-05 DIAGNOSIS — Z23 Encounter for immunization: Secondary | ICD-10-CM | POA: Diagnosis not present

## 2017-07-05 DIAGNOSIS — N186 End stage renal disease: Secondary | ICD-10-CM | POA: Diagnosis not present

## 2017-07-05 DIAGNOSIS — N2581 Secondary hyperparathyroidism of renal origin: Secondary | ICD-10-CM | POA: Diagnosis not present

## 2017-07-06 ENCOUNTER — Ambulatory Visit: Payer: Self-pay | Admitting: Surgery

## 2017-07-06 DIAGNOSIS — A630 Anogenital (venereal) warts: Secondary | ICD-10-CM | POA: Diagnosis not present

## 2017-07-06 NOTE — H&P (Signed)
History of Present Illness Kimberly Gave M. Gerado Nabers MD; 07/06/2017 11:38 AM) Patient words: CC: HEre for f/u - hx of anal condyloma  HPI:Kimberly [name] is a very pleasant 73yoF here today for f/u. She underwent excision of anal condyloma 04/23/17 - she had undergone prior excision of these 07/03/16 by Dr. Redmond Pulling and subsequently these recurred.  She is a history of kidney transplant has been on immunosuppressive medications.  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  Her nephrologist has since discontinued her immunosuppressive medicines given that she is early on dialysis 3 times per week and had worsening issues with her anal condyloma.  Today she reports doing reasonably well.  She had some pain postoperatively which has since resolved.  Her wounds as she reports are healing well.  Her pathology was reviewed with her and was benign-notable for anal condyloma.  She asked about the etiology of condyloma and we discussed the human papilloma virus and potential etiologies. Denies f/c/n/v.  PMH: HTN, HLD, ESRD on HD 3x/wk, anemia of chronic disease processes well controlled with iron supplementation PSH: As above  FHx: Denies FHx of colorectal CA Social: Denies use of tobacco/EtOH/drugs ROS: A comprehensive 10 system review of systems was completed with the patient and pertinent findings as noted above.  The patient is a 62 year old female.   Allergies (Kimberly [name], [place]; 07/06/2017 11:12 AM) No Known Drug Allergies  [02/29/2016]: Allergies Reconciled    Medication History (Kimberly [name], RMA; 07/06/2017 11:12 AM) Anusol-HC  (2.5% Cream, 1 (one) Rectal twice daily, as needed, Taken starting 07/14/2016) Active. Lidocaine  (5% Ointment, 1 External three times daily, as needed, Taken starting 04/30/2017) Active. Imiquimod  (5% Cream, 1 (one) External three times a week, Taken starting 05/12/2017) Active. (apply at night monday/wed/friday) Accu-Chek  SmartView  (In Vitro) Active. Fluticasone Propionate  (50MCG/ACT Suspension, Nasal as needed) Active. Lantus  (100UNIT/ML Solution, Subcutaneous daily) Active. Ezetimibe-Simvastatin  (10-20MG  Tablet, Oral daily) Active. AmLODIPine Besylate  (Oral) Specific strength unknown - Active. Labetalol HCl  (Oral) Specific strength unknown - Active. Potassium Chloride ER  (Oral) Specific strength unknown - Active. Lantus SoloStar  (Subcutaneous) Specific strength unknown - Active. Prograf  (Oral) Specific strength unknown - Active. Mycophenolate Mofetil  (250MG  Capsule, Oral) Active. (held by dr till thursday 10/02/16 she can continue.) Medications Reconciled     Review of Systems Kimberly Gave M. Orra Nolde MD; 07/06/2017 11:38 AM) General Not Present- Appetite Loss, Chills, Fatigue, Fever, Night Sweats, Weight Gain and Weight Loss. Note:  All other systems negative (unless as noted in HPI & included Review of Systems) Skin Not Present- Change in Wart/Mole, Dryness, Hives, Jaundice, New Lesions, Non-Healing Wounds, Rash and Ulcer. HEENT Not Present- Earache, Hearing Loss, Hoarseness, Nose Bleed, Oral Ulcers, Ringing in the Ears, Seasonal Allergies, Sinus Pain, Sore Throat, Visual Disturbances, Wears glasses/contact lenses and Yellow Eyes. Respiratory Not Present- Bloody sputum, Chronic Cough, Difficulty Breathing, Snoring and Wheezing. Breast Not Present- Breast Mass, Breast Pain, Nipple Discharge and Skin Changes. Cardiovascular Not Present- Chest Pain, Difficulty Breathing Lying Down, Leg Cramps, Palpitations, Rapid Heart Rate, Shortness of Breath and Swelling of Extremities. Gastrointestinal Not Present- Abdominal Pain, Bloating, Bloody Stool, Change in Bowel Habits, Chronic diarrhea, Constipation, Difficulty Swallowing, Excessive gas, Gets full quickly at meals, Hemorrhoids, Indigestion, Nausea, Rectal Pain and Vomiting. Female Genitourinary Not Present- Frequency, Nocturia, Painful Urination, Pelvic Pain  and Urgency. Musculoskeletal Not Present- Back Pain, Joint Pain, Joint Stiffness, Muscle Pain, Muscle Weakness and  Swelling of Extremities. Neurological Not Present- Decreased Memory, Fainting, Headaches, Numbness, Seizures, Tingling, Tremor, Trouble walking and Weakness. Psychiatric Not Present- Anxiety, Bipolar, Change in Sleep Pattern, Depression, Fearful and Frequent crying. Endocrine Not Present- Cold Intolerance, Excessive Hunger, Hair Changes, Heat Intolerance, Hot flashes and New Diabetes. Hematology Not Present- Easy Bruising, Excessive bleeding, Gland problems, HIV and Persistent Infections.  Vitals (Kimberly [name] RMA; 07/06/2017 11:11 AM) 07/06/2017 11:11 AM Weight: 146.2 lb   Height: 66 in  Body Surface Area: 1.75 m   Body Mass Index: 23.6 kg/m   Temp.: 98.2 F    Pulse: 74 (Regular)    BP: 122/78 (Sitting, Left Arm, Standard)       Physical Exam Kimberly Gave M. Ruthvik Barnaby MD; 07/06/2017 11:39 AM) The physical exam findings are as follows: Note: Constitutional: No acute distress; conversant; no deformities Eyes: Moist conjunctiva; no lid lag; anicteric sclerae; pupils equal round and reactive to light Neck: Trachea midline; no palpable thyromegaly Lungs: Normal respiratory effort; no tactile fremitus CV: Regular rate and rhythm; no palpable thrill; no pitting edema GI: Abdomen soft, nontender, nondistended; no palpable hepatosplenomegaly Anorectal: Dramatic improvement perianal appearance. There are no visible external perianal condylomatous lesions. DRE-no palpable masses. Strong anorectal tone Anoscopy: 3 small condyloma visible in the distal anal canal. No other concerning findings. No fissures or significant hemorrhoids. MSK: Normal gait; no clubbing/cyanosis Psychiatric: Appropriate affect; alert and oriented 3 Lymphatic: No palpable cervical or axillary lymphadenopathy **A chaperone, Kimberly [name], was present for the entire physical exam    Assessment & Plan  Kimberly Gave M. Lamaj Metoyer MD; 07/06/2017 11:43 AM) ANAL CONDYLOMA (A63.0) Impression: [name] is a very pleasant 61yF here for f/u - hx of anal condyloma s/p excision x2 - last excision she was noted to have extensive condyloma burden. She has recovered well and has since discontinued her immunosuppressive medications. She currently has a couple small condyloma in the anal canal -Continue Aldara cream; she is up to date with her colonoscopy, last being 02/2016 - told to have it repeated in 5 years -We'll schedule for anal exam under anesthesia, excision of condyloma, all other indicated procedures -The anatomy and physiology of the anus and rectum was discussed at length with the patient. The pathophysiology of condyloma was discussed at length with associated pictures and illustrations. The planned procedure, material risks (including, but not limited to, pain, bleeding, infection, scarring, need for blood transfusion, damage to anal sphincter, incontinence of gas and/or stool, need for additional procedures, recurrence, pneumonia, heart attack, stroke, death) benefits and alternatives to surgery were discussed at length. I noted a good probability that the procedure would help improve their symptoms. Her questions were answered to her satisfaction and she elected to proceed with surgery.  Signed electronically by Ileana Roup, MD (07/06/2017 11:43 AM)

## 2017-07-07 DIAGNOSIS — Z23 Encounter for immunization: Secondary | ICD-10-CM | POA: Diagnosis not present

## 2017-07-07 DIAGNOSIS — Z4931 Encounter for adequacy testing for hemodialysis: Secondary | ICD-10-CM | POA: Diagnosis not present

## 2017-07-07 DIAGNOSIS — N186 End stage renal disease: Secondary | ICD-10-CM | POA: Diagnosis not present

## 2017-07-07 DIAGNOSIS — N2581 Secondary hyperparathyroidism of renal origin: Secondary | ICD-10-CM | POA: Diagnosis not present

## 2017-07-07 IMAGING — NM NM THYROID IMAGING W/ UPTAKE SINGLE (24 HR)
4 series · 4 of 4 positions shown · non-contrast
Comparison: None

CLINICAL DATA: Negative thyroid biopsy 10 years ago per patient.

EXAM:
THYROID SCAN AND UPTAKE - 24 HOURS
TECHNIQUE: Following the per oral administration of 8-IJI sodium iodide, the
patient returned at 24 hours and uptake measurements were acquired
with the uptake probe centered on the neck. Thyroid imaging was
performed following the intravenous administration of the Ec-VVm
Pertechnetate.
RADIOPHARMACEUTICALS:  11.25 MicroCuries I 131 sodium iodide orally
and 9.8 mCi Nechnetium-99m pertechnetate IV

[Series 1: anterior · 3.25mm/px · 1 of 1 slices shown]
[im 1/1]
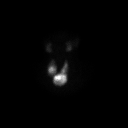

[Series 2: ant w marker · 3.25mm/px · 1 of 1 slices shown]
[im 1/1]
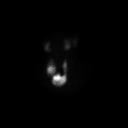

[Series 3: lao · 3.25mm/px · 1 of 1 slices shown]
[im 1/1]
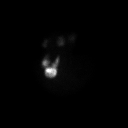

[Series 4: rao · 3.25mm/px · 1 of 1 slices shown]
[im 1/1]
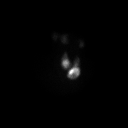

[4 of 4 positions shown; findings below may reference images not displayed]

FINDINGS: Heterogeneous uptake is seen bilaterally in the thyroid. There is a
nodule off the lower pole of the left thyroid lobe. This nodule
demonstrates heterogeneous uptake with increased uptake superiorly
and decreased uptake inferiorly. By report, the 24 hour radioactive
iodine uptake is 2.3%.
IMPRESSION: 1. Multi nodular goiter. There is a dominant nodule off the lower
left thyroid lobe which demonstrates heterogeneous uptake with
increased uptake superiorly and decreased uptake inferiorly. By
report, the patient has had a previous biopsy. Recommend clinical
correlation. If the heterogeneous inferior nodule has not been
previously biopsied, recommend ultrasound with possible biopsy given
the decreased uptake inferiorly.
2. Decreased iodine uptake in the thyroid. This can be seen in the
setting of exogenous thyroid hormone consumption, ectopic thyroid
tissue secreting thyroid hormone, thyroiditis, or a recent large
iodine load. Recommend clinical correlation.

## 2017-07-09 DIAGNOSIS — Z23 Encounter for immunization: Secondary | ICD-10-CM | POA: Diagnosis not present

## 2017-07-09 DIAGNOSIS — N186 End stage renal disease: Secondary | ICD-10-CM | POA: Diagnosis not present

## 2017-07-09 DIAGNOSIS — N2581 Secondary hyperparathyroidism of renal origin: Secondary | ICD-10-CM | POA: Diagnosis not present

## 2017-07-09 DIAGNOSIS — Z4931 Encounter for adequacy testing for hemodialysis: Secondary | ICD-10-CM | POA: Diagnosis not present

## 2017-07-12 DIAGNOSIS — N2581 Secondary hyperparathyroidism of renal origin: Secondary | ICD-10-CM | POA: Diagnosis not present

## 2017-07-12 DIAGNOSIS — Z23 Encounter for immunization: Secondary | ICD-10-CM | POA: Diagnosis not present

## 2017-07-12 DIAGNOSIS — N186 End stage renal disease: Secondary | ICD-10-CM | POA: Diagnosis not present

## 2017-07-12 DIAGNOSIS — Z4931 Encounter for adequacy testing for hemodialysis: Secondary | ICD-10-CM | POA: Diagnosis not present

## 2017-07-14 DIAGNOSIS — N2581 Secondary hyperparathyroidism of renal origin: Secondary | ICD-10-CM | POA: Diagnosis not present

## 2017-07-14 DIAGNOSIS — N186 End stage renal disease: Secondary | ICD-10-CM | POA: Diagnosis not present

## 2017-07-14 DIAGNOSIS — Z4931 Encounter for adequacy testing for hemodialysis: Secondary | ICD-10-CM | POA: Diagnosis not present

## 2017-07-14 DIAGNOSIS — Z23 Encounter for immunization: Secondary | ICD-10-CM | POA: Diagnosis not present

## 2017-07-16 DIAGNOSIS — N2581 Secondary hyperparathyroidism of renal origin: Secondary | ICD-10-CM | POA: Diagnosis not present

## 2017-07-16 DIAGNOSIS — Z4931 Encounter for adequacy testing for hemodialysis: Secondary | ICD-10-CM | POA: Diagnosis not present

## 2017-07-16 DIAGNOSIS — N186 End stage renal disease: Secondary | ICD-10-CM | POA: Diagnosis not present

## 2017-07-16 DIAGNOSIS — Z23 Encounter for immunization: Secondary | ICD-10-CM | POA: Diagnosis not present

## 2017-07-19 DIAGNOSIS — N2581 Secondary hyperparathyroidism of renal origin: Secondary | ICD-10-CM | POA: Diagnosis not present

## 2017-07-19 DIAGNOSIS — Z23 Encounter for immunization: Secondary | ICD-10-CM | POA: Diagnosis not present

## 2017-07-19 DIAGNOSIS — Z4931 Encounter for adequacy testing for hemodialysis: Secondary | ICD-10-CM | POA: Diagnosis not present

## 2017-07-19 DIAGNOSIS — N186 End stage renal disease: Secondary | ICD-10-CM | POA: Diagnosis not present

## 2017-07-21 DIAGNOSIS — N186 End stage renal disease: Secondary | ICD-10-CM | POA: Diagnosis not present

## 2017-07-21 DIAGNOSIS — Z23 Encounter for immunization: Secondary | ICD-10-CM | POA: Diagnosis not present

## 2017-07-21 DIAGNOSIS — Z4931 Encounter for adequacy testing for hemodialysis: Secondary | ICD-10-CM | POA: Diagnosis not present

## 2017-07-21 DIAGNOSIS — N2581 Secondary hyperparathyroidism of renal origin: Secondary | ICD-10-CM | POA: Diagnosis not present

## 2017-07-23 DIAGNOSIS — N186 End stage renal disease: Secondary | ICD-10-CM | POA: Diagnosis not present

## 2017-07-23 DIAGNOSIS — Z4931 Encounter for adequacy testing for hemodialysis: Secondary | ICD-10-CM | POA: Diagnosis not present

## 2017-07-23 DIAGNOSIS — N2581 Secondary hyperparathyroidism of renal origin: Secondary | ICD-10-CM | POA: Diagnosis not present

## 2017-07-23 DIAGNOSIS — Z23 Encounter for immunization: Secondary | ICD-10-CM | POA: Diagnosis not present

## 2017-07-23 DIAGNOSIS — Z992 Dependence on renal dialysis: Secondary | ICD-10-CM | POA: Diagnosis not present

## 2017-07-23 DIAGNOSIS — T8610 Unspecified complication of kidney transplant: Secondary | ICD-10-CM | POA: Diagnosis not present

## 2017-07-24 DIAGNOSIS — N186 End stage renal disease: Secondary | ICD-10-CM | POA: Diagnosis not present

## 2017-07-24 DIAGNOSIS — Z992 Dependence on renal dialysis: Secondary | ICD-10-CM | POA: Diagnosis not present

## 2017-07-24 DIAGNOSIS — T8610 Unspecified complication of kidney transplant: Secondary | ICD-10-CM | POA: Diagnosis not present

## 2017-07-24 NOTE — Patient Instructions (Signed)
MELAT WRISLEY  07/24/2017   Your procedure is scheduled on: Friday 07/31/2017  Report to [facility] Main  Entrance             Report to admitting at 1000 AM   Call this number if you have problems the morning of surgery [phone]    Remember: Do not eat food or drink liquids :After Midnight.    Please use one Fleets enema morning of surgery about 3-4 hours before coming to hospital!   Take these medicines the morning of surgery with A SIP OF WATER:Labetalol,Prilosec, Prdenisone   How to Manage Your Diabetes Before and After Surgery  Why is it important to control my blood sugar before and after surgery? . Improving blood sugar levels before and after surgery helps healing and can limit problems. . A way of improving blood sugar control is eating a healthy diet by: o  Eating less sugar and carbohydrates o  Increasing activity/exercise o  Talking with your doctor about reaching your blood sugar goals . High blood sugars (greater than 180 mg/dL) can raise your risk of infections and slow your recovery, so you will need to focus on controlling your diabetes during the weeks before surgery. . Make sure that the doctor who takes care of your diabetes knows about your planned surgery including the date and location.  How do I manage my blood sugar before surgery? . Check your blood sugar at least 4 times a day, starting 2 days before surgery, to make sure that the level is not too high or low. o Check your blood sugar the morning of your surgery when you wake up and every 2 hours until you get to the Short Stay unit. . If your blood sugar is less than 70 mg/dL, you will need to treat for low blood sugar: o Do not take insulin. o Treat a low blood sugar (less than 70 mg/dL) with  cup of clear juice (cranberry or apple), 4 glucose tablets, OR glucose gel. o Recheck blood sugar in 15 minutes after treatment (to make sure it is greater than 70 mg/dL). If your  blood sugar is not greater than 70 mg/dL on recheck, call [phone] for further instructions. . Report your blood sugar to the short stay nurse when you get to Short Stay.  . If you are admitted to the hospital after surgery: o Your blood sugar will be checked by the staff and you will probably be given insulin after surgery (instead of oral diabetes medicines) to make sure you have good blood sugar levels. o The goal for blood sugar control after surgery is 80-180 mg/dL.   WHAT DO I DO ABOUT MY DIABETES MEDICATION?  Marland Kitchen Do not take oral diabetes medicines (pills) the morning of surgery.  . THE NIGHT BEFORE SURGERY, take  5    units of  Lantus      insulin.       . THE MORNING OF SURGERY, take  0  units of     Lantus    insulin.      DO NOT TAKE ANY DIABETIC MEDICATIONS DAY OF YOUR SURGERY                               You may not have any metal on your body including hair pins and  piercings  Do not wear jewelry, make-up, lotions, powders or perfumes, deodorant             Do not wear nail polish.  Do not shave  48 hours prior to surgery.              Men may shave face and neck.   Do not bring valuables to the hospital. [place].  Contacts, dentures or bridgework may not be worn into surgery.  Leave suitcase in the car. After surgery it may be brought to your room.     Patients discharged the day of surgery will not be allowed to drive home.  Name and phone number of your driver:  Special Instructions: N/A              Please read over the following fact sheets you were given: _____________________________________________________________________             [facility] - Preparing for Surgery Before surgery, you can play an important role.  Because skin is not sterile, your skin needs to be as free of germs as possible.  You can reduce the number of germs on your skin by washing with CHG (chlorahexidine  gluconate) soap before surgery.  CHG is an antiseptic cleaner which kills germs and bonds with the skin to continue killing germs even after washing. Please DO NOT use if you have an allergy to CHG or antibacterial soaps.  If your skin becomes reddened/irritated stop using the CHG and inform your nurse when you arrive at Short Stay. Do not shave (including legs and underarms) for at least 48 hours prior to the first CHG shower.  You may shave your face/neck. Please follow these instructions carefully:  1.  Shower with CHG Soap the night before surgery and the  morning of Surgery.  2.  If you choose to wash your hair, wash your hair first as usual with your  normal  shampoo.  3.  After you shampoo, rinse your hair and body thoroughly to remove the  shampoo.                           4.  Use CHG as you would any other liquid soap.  You can apply chg directly  to the skin and wash                       Gently with a scrungie or clean washcloth.  5.  Apply the CHG Soap to your body ONLY FROM THE NECK DOWN.   Do not use on face/ open                           Wound or open sores. Avoid contact with eyes, ears mouth and genitals (private parts).                       Wash face,  Genitals (private parts) with your normal soap.             6.  Wash thoroughly, paying special attention to the area where your surgery  will be performed.  7.  Thoroughly rinse your body with warm water from the neck down.  8.  DO NOT shower/wash with your normal soap after using and  rinsing off  the CHG Soap.                9.  Pat yourself dry with a clean towel.            10.  Wear clean pajamas.            11.  Place clean sheets on your bed the night of your first shower and do not  sleep with pets. Day of Surgery : Do not apply any lotions/deodorants the morning of surgery.  Please wear clean clothes to the hospital/surgery center.  FAILURE TO FOLLOW THESE INSTRUCTIONS MAY RESULT IN THE CANCELLATION OF YOUR  SURGERY PATIENT SIGNATURE_________________________________  NURSE SIGNATURE__________________________________  ________________________________________________________________________

## 2017-07-24 NOTE — Progress Notes (Signed)
05/26/2017-Labs from [facility] on chart-HgA1c, CMP, CBC, Lipid, Uric acid, TSH, Free T4, Vitamin D, Apolopoprotein  05/27/2017-Last office visit from Dr. Marton Redwood on chart

## 2017-07-26 DIAGNOSIS — N186 End stage renal disease: Secondary | ICD-10-CM | POA: Diagnosis not present

## 2017-07-26 DIAGNOSIS — N2581 Secondary hyperparathyroidism of renal origin: Secondary | ICD-10-CM | POA: Diagnosis not present

## 2017-07-26 DIAGNOSIS — D509 Iron deficiency anemia, unspecified: Secondary | ICD-10-CM | POA: Diagnosis not present

## 2017-07-27 ENCOUNTER — Encounter (HOSPITAL_COMMUNITY): Payer: Self-pay

## 2017-07-27 ENCOUNTER — Encounter (HOSPITAL_COMMUNITY)
Admission: RE | Admit: 2017-07-27 | Discharge: 2017-07-27 | Disposition: A | Discharge status: Home / Self Care | Payer: Medicare Other | Source: Ambulatory Visit | Attending: Surgery | Admitting: Surgery

## 2017-07-27 ENCOUNTER — Other Ambulatory Visit: Payer: Self-pay

## 2017-07-27 DIAGNOSIS — A630 Anogenital (venereal) warts: Secondary | ICD-10-CM | POA: Diagnosis not present

## 2017-07-27 DIAGNOSIS — I517 Cardiomegaly: Secondary | ICD-10-CM | POA: Diagnosis not present

## 2017-07-27 DIAGNOSIS — R9431 Abnormal electrocardiogram [ECG] [EKG]: Secondary | ICD-10-CM | POA: Diagnosis not present

## 2017-07-27 DIAGNOSIS — Z01812 Encounter for preprocedural laboratory examination: Secondary | ICD-10-CM | POA: Diagnosis not present

## 2017-07-27 DIAGNOSIS — Z01810 Encounter for preprocedural cardiovascular examination: Secondary | ICD-10-CM | POA: Insufficient documentation

## 2017-07-27 HISTORY — DX: Pneumonia, unspecified organism: J18.9

## 2017-07-27 LAB — GLUCOSE, CAPILLARY: GLUCOSE-CAPILLARY: 125 mg/dL — AB (ref 65–99)

## 2017-07-28 DIAGNOSIS — D509 Iron deficiency anemia, unspecified: Secondary | ICD-10-CM | POA: Diagnosis not present

## 2017-07-28 DIAGNOSIS — N2581 Secondary hyperparathyroidism of renal origin: Secondary | ICD-10-CM | POA: Diagnosis not present

## 2017-07-28 DIAGNOSIS — N186 End stage renal disease: Secondary | ICD-10-CM | POA: Diagnosis not present

## 2017-07-30 DIAGNOSIS — D509 Iron deficiency anemia, unspecified: Secondary | ICD-10-CM | POA: Diagnosis not present

## 2017-07-30 DIAGNOSIS — N186 End stage renal disease: Secondary | ICD-10-CM | POA: Diagnosis not present

## 2017-07-30 DIAGNOSIS — N2581 Secondary hyperparathyroidism of renal origin: Secondary | ICD-10-CM | POA: Diagnosis not present

## 2017-07-31 ENCOUNTER — Encounter (HOSPITAL_COMMUNITY)
Admission: RE | Disposition: A | Discharge status: Home / Self Care | Payer: Self-pay | Source: Ambulatory Visit | Attending: Surgery

## 2017-07-31 ENCOUNTER — Ambulatory Visit (HOSPITAL_COMMUNITY): Payer: Medicare Other | Admitting: Certified Registered"

## 2017-07-31 ENCOUNTER — Encounter (HOSPITAL_COMMUNITY): Payer: Self-pay | Admitting: Certified Registered"

## 2017-07-31 ENCOUNTER — Ambulatory Visit (HOSPITAL_COMMUNITY)
Admission: RE | Admit: 2017-07-31 | Discharge: 2017-07-31 | Disposition: A | Discharge status: Home / Self Care | Payer: Medicare Other | Source: Ambulatory Visit | Attending: Surgery | Admitting: Surgery

## 2017-07-31 DIAGNOSIS — E1122 Type 2 diabetes mellitus with diabetic chronic kidney disease: Secondary | ICD-10-CM | POA: Diagnosis not present

## 2017-07-31 DIAGNOSIS — Z87891 Personal history of nicotine dependence: Secondary | ICD-10-CM | POA: Diagnosis not present

## 2017-07-31 DIAGNOSIS — Z7951 Long term (current) use of inhaled steroids: Secondary | ICD-10-CM | POA: Insufficient documentation

## 2017-07-31 DIAGNOSIS — I129 Hypertensive chronic kidney disease with stage 1 through stage 4 chronic kidney disease, or unspecified chronic kidney disease: Secondary | ICD-10-CM | POA: Diagnosis not present

## 2017-07-31 DIAGNOSIS — I120 Hypertensive chronic kidney disease with stage 5 chronic kidney disease or end stage renal disease: Secondary | ICD-10-CM | POA: Insufficient documentation

## 2017-07-31 DIAGNOSIS — E785 Hyperlipidemia, unspecified: Secondary | ICD-10-CM | POA: Diagnosis not present

## 2017-07-31 DIAGNOSIS — Z794 Long term (current) use of insulin: Secondary | ICD-10-CM | POA: Insufficient documentation

## 2017-07-31 DIAGNOSIS — Z79899 Other long term (current) drug therapy: Secondary | ICD-10-CM | POA: Insufficient documentation

## 2017-07-31 DIAGNOSIS — T8611 Kidney transplant rejection: Secondary | ICD-10-CM | POA: Insufficient documentation

## 2017-07-31 DIAGNOSIS — Z992 Dependence on renal dialysis: Secondary | ICD-10-CM | POA: Diagnosis not present

## 2017-07-31 DIAGNOSIS — Y830 Surgical operation with transplant of whole organ as the cause of abnormal reaction of the patient, or of later complication, without mention of misadventure at the time of the procedure: Secondary | ICD-10-CM | POA: Insufficient documentation

## 2017-07-31 DIAGNOSIS — D631 Anemia in chronic kidney disease: Secondary | ICD-10-CM | POA: Diagnosis not present

## 2017-07-31 DIAGNOSIS — A630 Anogenital (venereal) warts: Secondary | ICD-10-CM | POA: Insufficient documentation

## 2017-07-31 DIAGNOSIS — N186 End stage renal disease: Secondary | ICD-10-CM | POA: Insufficient documentation

## 2017-07-31 DIAGNOSIS — Z7952 Long term (current) use of systemic steroids: Secondary | ICD-10-CM | POA: Diagnosis not present

## 2017-07-31 DIAGNOSIS — N184 Chronic kidney disease, stage 4 (severe): Secondary | ICD-10-CM | POA: Diagnosis not present

## 2017-07-31 HISTORY — PX: MINOR FULGERATION OF ANAL CONDYLOMA: SHX6467

## 2017-07-31 LAB — GLUCOSE, CAPILLARY
Glucose-Capillary: 93 mg/dL (ref 65–99)
Glucose-Capillary: 103 mg/dL — ABNORMAL HIGH (ref 65–99)

## 2017-07-31 LAB — BASIC METABOLIC PANEL
Anion gap: 12 (ref 5–15)
BUN: 29 mg/dL — ABNORMAL HIGH (ref 6–20)
CHLORIDE: 98 mmol/L — AB (ref 101–111)
CO2: 29 mmol/L (ref 22–32)
Calcium: 9.2 mg/dL (ref 8.9–10.3)
Creatinine, Ser: 4.6 mg/dL — ABNORMAL HIGH (ref 0.44–1.00)
GFR calc Af Amer: 11 mL/min — ABNORMAL LOW (ref >60)
GFR calc non Af Amer: 9 mL/min — ABNORMAL LOW (ref >60)
GLUCOSE: 92 mg/dL (ref 65–99)
POTASSIUM: 4.5 mmol/L (ref 3.5–5.1)
Sodium: 139 mmol/L (ref 135–145)

## 2017-07-31 LAB — CBC
HEMATOCRIT: 42 % (ref 36.0–46.0)
HEMOGLOBIN: 13.9 g/dL (ref 12.0–15.0)
MCH: 30.5 pg (ref 26.0–34.0)
MCHC: 33.1 g/dL (ref 30.0–36.0)
MCV: 92.3 fL (ref 78.0–100.0)
Platelets: 190 10*3/uL (ref 150–400)
RBC: 4.55 MIL/uL (ref 3.87–5.11)
RDW: 15.2 % (ref 11.5–15.5)
WBC: 5.8 10*3/uL (ref 4.0–10.5)

## 2017-07-31 SURGERY — EXAM UNDER ANESTHESIA
Anesthesia: Monitor Anesthesia Care

## 2017-07-31 MED ORDER — DEXAMETHASONE SODIUM PHOSPHATE 10 MG/ML IJ SOLN
INTRAMUSCULAR | Status: AC
  Filled 2017-07-31: qty 1

## 2017-07-31 MED ORDER — PROPOFOL 500 MG/50ML IV EMUL
INTRAVENOUS | Status: DC | PRN
  Administered 2017-07-31: 50 ug/kg/min via INTRAVENOUS

## 2017-07-31 MED ORDER — LIDOCAINE 2% (20 MG/ML) 5 ML SYRINGE
INTRAMUSCULAR | Status: DC | PRN
  Administered 2017-07-31: 50 mg via INTRAVENOUS

## 2017-07-31 MED ORDER — CHLORHEXIDINE GLUCONATE CLOTH 2 % EX PADS: 6.0000 | MEDICATED_PAD | Freq: Once | CUTANEOUS | Status: DC

## 2017-07-31 MED ORDER — BUPIVACAINE-EPINEPHRINE 0.25% -1:200000 IJ SOLN
INTRAMUSCULAR | Status: DC | PRN
  Administered 2017-07-31: 30 mL

## 2017-07-31 MED ORDER — ONDANSETRON HCL 4 MG/2ML IJ SOLN
INTRAMUSCULAR | Status: DC | PRN
  Administered 2017-07-31: 4 mg via INTRAVENOUS

## 2017-07-31 MED ORDER — BUPIVACAINE-EPINEPHRINE 0.25% -1:200000 IJ SOLN
INTRAMUSCULAR | Status: AC
  Filled 2017-07-31: qty 1

## 2017-07-31 MED ORDER — MIDAZOLAM HCL 5 MG/5ML IJ SOLN
INTRAMUSCULAR | Status: DC | PRN
  Administered 2017-07-31: 1 mg via INTRAVENOUS

## 2017-07-31 MED ORDER — SODIUM CHLORIDE 0.9 % IV SOLN
INTRAVENOUS | Status: DC | PRN
  Administered 2017-07-31: 11:00:00 via INTRAVENOUS

## 2017-07-31 MED ORDER — LIDOCAINE 2% (20 MG/ML) 5 ML SYRINGE
INTRAMUSCULAR | Status: AC
  Filled 2017-07-31: qty 5

## 2017-07-31 MED ORDER — FENTANYL CITRATE (PF) 100 MCG/2ML IJ SOLN: 25.0000 ug | INTRAMUSCULAR | Status: DC | PRN

## 2017-07-31 MED ORDER — FENTANYL CITRATE (PF) 100 MCG/2ML IJ SOLN
INTRAMUSCULAR | Status: AC
  Filled 2017-07-31: qty 2

## 2017-07-31 MED ORDER — PROPOFOL 10 MG/ML IV BOLUS
INTRAVENOUS | Status: AC
  Filled 2017-07-31: qty 20

## 2017-07-31 MED ORDER — LACTATED RINGERS IV SOLN: INTRAVENOUS | Status: DC

## 2017-07-31 MED ORDER — ACETAMINOPHEN 500 MG PO TABS
1000.0000 mg | ORAL_TABLET | ORAL | Status: AC
  Administered 2017-07-31: 1000 mg via ORAL
  Filled 2017-07-31: qty 2

## 2017-07-31 MED ORDER — OXYCODONE-ACETAMINOPHEN 10-325 MG PO TABS: 1.0000 | ORAL_TABLET | Freq: Four times a day (QID) | ORAL | 0 refills | Status: AC | PRN

## 2017-07-31 MED ORDER — MIDAZOLAM HCL 2 MG/2ML IJ SOLN
INTRAMUSCULAR | Status: AC
  Filled 2017-07-31: qty 2

## 2017-07-31 MED ORDER — BUPIVACAINE LIPOSOME 1.3 % IJ SUSP
20.0000 mL | Freq: Once | INTRAMUSCULAR | Status: AC
  Administered 2017-07-31: 20 mL
  Filled 2017-07-31: qty 20

## 2017-07-31 MED ORDER — DEXAMETHASONE SODIUM PHOSPHATE 10 MG/ML IJ SOLN
INTRAMUSCULAR | Status: DC | PRN
  Administered 2017-07-31: 10 mg via INTRAVENOUS

## 2017-07-31 MED ORDER — FENTANYL CITRATE (PF) 100 MCG/2ML IJ SOLN
INTRAMUSCULAR | Status: DC | PRN
  Administered 2017-07-31 (×2): 50 ug via INTRAVENOUS

## 2017-07-31 MED ORDER — ONDANSETRON HCL 4 MG/2ML IJ SOLN
INTRAMUSCULAR | Status: AC
  Filled 2017-07-31: qty 2

## 2017-07-31 MED ORDER — 0.9 % SODIUM CHLORIDE (POUR BTL) OPTIME
TOPICAL | Status: DC | PRN
  Administered 2017-07-31: 1000 mL

## 2017-07-31 SURGICAL SUPPLY — 41 items
BENZOIN TINCTURE PRP APPL 2/3 (GAUZE/BANDAGES/DRESSINGS) ×6 IMPLANT
BLADE EXTENDED COATED 6.5IN (ELECTRODE) IMPLANT
BLADE HEX COATED 2.75 (ELECTRODE) ×3 IMPLANT
BRIEF STRETCH FOR OB PAD LRG (UNDERPADS AND DIAPERS) ×6 IMPLANT
DECANTER SPIKE VIAL GLASS SM (MISCELLANEOUS) ×3 IMPLANT
DRAPE LAPAROTOMY 100X72 PEDS (DRAPES) ×3 IMPLANT
DRAPE UTILITY XL STRL (DRAPES) ×3 IMPLANT
ELECT REM PT RETURN 9FT ADLT (ELECTROSURGICAL) ×3
ELECTRODE REM PT RTRN 9FT ADLT (ELECTROSURGICAL) ×1 IMPLANT
GAUZE SPONGE 4X4 12PLY STRL (GAUZE/BANDAGES/DRESSINGS) ×3 IMPLANT
GAUZE SPONGE 4X4 16PLY XRAY LF (GAUZE/BANDAGES/DRESSINGS) ×3 IMPLANT
GLOVE BIO SURGEON STRL SZ7.5 (GLOVE) ×3 IMPLANT
GLOVE INDICATOR 8.0 STRL GRN (GLOVE) ×3 IMPLANT
GOWN STRL REUS W/TWL LRG LVL3 (GOWN DISPOSABLE) ×3 IMPLANT
KIT BASIN OR (CUSTOM PROCEDURE TRAY) ×3 IMPLANT
NDL SAFETY ECLIPSE 18X1.5 (NEEDLE) IMPLANT
NEEDLE HYPO 18GX1.5 SHARP (NEEDLE)
NEEDLE HYPO 22GX1.5 SAFETY (NEEDLE) ×3 IMPLANT
PACK GENERAL/GYN (CUSTOM PROCEDURE TRAY) ×3 IMPLANT
PAD ABD 8X10 STRL (GAUZE/BANDAGES/DRESSINGS) ×3 IMPLANT
PENCIL BUTTON HOLSTER BLD 10FT (ELECTRODE) ×3 IMPLANT
SPONGE GAUZE 4X4 12PLY STER LF (GAUZE/BANDAGES/DRESSINGS) ×3 IMPLANT
SPONGE HEMORRHOID 8X3CM (HEMOSTASIS) IMPLANT
SPONGE SURGIFOAM ABS GEL 12-7 (HEMOSTASIS) IMPLANT
SUT CHROMIC 2 0 SH (SUTURE) IMPLANT
SUT CHROMIC 3 0 SH 27 (SUTURE) IMPLANT
SUT ETHIBOND 0 (SUTURE) IMPLANT
SUT MNCRL AB 4-0 PS2 18 (SUTURE) IMPLANT
SUT SILK 2 0 (SUTURE)
SUT SILK 2-0 18XBRD TIE 12 (SUTURE) IMPLANT
SUT VIC AB 2-0 SH 27 (SUTURE)
SUT VIC AB 2-0 SH 27XBRD (SUTURE) IMPLANT
SUT VIC AB 3-0 SH 18 (SUTURE) IMPLANT
SUT VIC AB 4-0 P-3 18XBRD (SUTURE) IMPLANT
SUT VIC AB 4-0 P3 18 (SUTURE)
SYR CONTROL 10ML LL (SYRINGE) ×3 IMPLANT
TOWEL OR 17X24 6PK STRL BLUE (TOWEL DISPOSABLE) ×3 IMPLANT
TRAY DSU PREP LF (CUSTOM PROCEDURE TRAY) ×3 IMPLANT
TUBE CONNECTING 12'X1/4 (SUCTIONS) ×1
TUBE CONNECTING 12X1/4 (SUCTIONS) ×2 IMPLANT
YANKAUER SUCT BULB TIP NO VENT (SUCTIONS) ×3 IMPLANT

## 2017-07-31 NOTE — Discharge Instructions (Signed)
ANORECTAL SURGERY: POST OP INSTRUCTIONS  1. DIET: Follow a light bland diet the first 24 hours after arrival home, such as soup, liquids, crackers, etc.  Be sure to include lots of fluids daily.  Avoid fast food or heavy meals as your are more likely to get nauseated.  Eat a low fat diet the next few days after surgery.    2. Take your usually prescribed home medications unless otherwise directed.  3. PAIN CONTROL: a. It is helpful to take an over-the-counter pain medication regularly for the first few days/weeks.  Choose from the following that works best for you: i. Ibuprofen (Advil, etc) Three 200mg  tabs every 6 hours as needed. ii. Acetaminophen (Tylenol, etc) 500-650mg  every 6 hours as needed iii. NOTE: You may take both of these medications together - most patients find it most helpful when alternating between the two (i.e. Ibuprofen at 6am, tylenol at 9am, ibuprofen at 12pm ...) b. A  prescription for pain medication may have been prescribed for you at discharge.  Take your pain medication as prescribed.  i. If you are having problems/concerns with the prescription medicine, please call us for further advice.  4. Avoid getting constipated.  Between the surgery and the pain medications, it is common to experience some constipation.  Increasing fluid intake (64oz of water per day) and taking a fiber supplement (such as Metamucil, Citrucel, FiberCon) 1-2 times a day regularly will usually help prevent this problem from occurring.  Take Miralax (over the counter) 1-2x/day while taking a narcotic pain medication. If no bowel movement after 48hours, you may additionally take a laxative like a bottle of Milk of Magnesia which can be purchased over the counter. Avoid enemas if possible as these are often painful.   5. Watch out for diarrhea.  If you have many loose bowel movements, simplify your diet to bland foods.  Stop any stool softeners and decrease your fiber supplement. If this worsens or does  not improve, please call us.  6. Wash / shower every day.  If you were discharged with a dressing, you may remove this the day after your surgery. You may shower normally, getting soap/water on your wound, particularly after bowel movements.  7. Soaking in a warm bath filled a couple inches ("Sitz bath") is a great way to clean the area after a bowel movement and many patients find it is a way to soothe the area.  8. ACTIVITIES as tolerated:   a. You may resume regular (light) daily activities beginning the next day--such as daily self-care, walking, climbing stairs--gradually increasing activities as tolerated.  If you can walk 30 minutes without difficulty, it is safe to try more intense activity such as jogging, treadmill, bicycling, low-impact aerobics, etc. b. Avoid activities that make your pain worse c. You may drive when you are no longer taking prescription pain medication, you can comfortably wear a seatbelt, and you can safely maneuver your car and apply brakes.  9. FOLLOW UP in our office a. Please call [facility] at [phone] to set up an appointment to see your surgeon in the office for a follow-up appointment approximately 2-4 weeks after your surgery. b. Make sure that you call for this appointment the day you arrive home to insure a convenient appointment time.  9. If you have disability or family leave forms that need to be completed, you may have them completed by your primary care physician's office; for return to work instructions, please ask our office staff and they  will be happy to assist you in obtaining this documentation   When to call us [phone]: 1. Poor pain control 2. Reactions / problems with new medications (rash/itching, etc)  3. Fever over 101.5 F (38.5 C) 4. Inability to urinate 5. Nausea/vomiting 6. Worsening swelling or bruising 7. Continued bleeding from incision. 8. Increased pain, redness, or drainage from the incision  The clinic staff is  available to answer your questions during regular business hours (8:30am-5pm).  Please dont hesitate to call and ask to speak to one of our nurses for clinical concerns.   A surgeon from [facility] is always on call at the hospitals   If you have a medical emergency, go to the nearest emergency room or call 911.   [facility], [address], [place], [place], [place]  [address] ? MAIN: [phone] FAX [phone] [email or web address]

## 2017-07-31 NOTE — Transfer of Care (Signed)
Immediate Anesthesia Transfer of Care Note  Patient: Kimberly [name]  Procedure(s) Performed: ANAL EXAM UNDER ANESTHESIA (N/A ) EXCISION  FULGERATION OF ANAL CONDYLOMA (N/A )  Patient Location: PACU  Anesthesia Type:MAC  Level of Consciousness: awake and patient cooperative  Airway & Oxygen Therapy: Patient Spontanous Breathing and Patient connected to face mask oxygen  Post-op Assessment: Report given to RN and Post -op Vital signs reviewed and stable  Post vital signs: Reviewed and stable  Last Vitals:  Vitals:   07/31/17 1025  BP: (!) 151/72  Pulse: 72  Resp: 16  Temp: 36.8 C  SpO2: 94%    Last Pain:  Vitals:   07/31/17 1025  TempSrc: Oral         Complications: No apparent anesthesia complications

## 2017-07-31 NOTE — Op Note (Signed)
07/31/2017  11:57 AM  PATIENT:  Kimberly [name]  62 y.o. female  Patient Care Team: Marton Redwood, MD as PCP - General (Internal Medicine)  PRE-OPERATIVE DIAGNOSIS:  Anal condyloma  POST-OPERATIVE DIAGNOSIS:  Anal condyloma  PROCEDURE:   1.  Anal exam under anesthesia 2.  Fulguration of anal condyloma  SURGEON:  Surgeon(s): Ileana Roup, MD  ASSISTANT: none   ANESTHESIA:   MAC  SPECIMEN:  No Specimen  DISPOSITION OF SPECIMEN:  N/A  COUNTS: Sponge, needle, and instrument counts were reported correct x2 at conclusion of the procedure  PLAN OF CARE: Discharge to home after PACU  PATIENT DISPOSITION:  PACU - hemodynamically stable.  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 Redmond Pulling and subsequently these recurred. She is a history of kidney transplant has been on immunosuppressive medications. 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 Her nephrologist has since discontinued her immunosuppressive medicines given that she is early on dialysis 3 times per week and had worsening issues with her anal condyloma. Her pathology was reviewed with her and was benign-notable for anal condyloma.  In the office, and anoscopy demonstrated 3 small condyloma in the distal anal canal but no other concerning findings.  There are no fissures or hemorrhoids seen.  She had a rather dramatic response following her most recent surgery.  Given the remaining although small burden of disease the decision was made to proceed with exam under anesthesia and excision/fulguration of condyloma.  The anatomy and physiology of the anus and rectum was discussed at length with the patient. The pathophysiology of condyloma was discussed at length with associated pictures and illustrations. The planned procedure, material risks (including, but not  limited to, pain, bleeding, infection, scarring, need for blood transfusion, damage to anal sphincter, incontinence of gas and/or stool, need for additional procedures, recurrence, pneumonia, heart attack, stroke, death) benefits and alternatives to surgery were discussed at length. I noted a good probability that the procedure would help improve their symptoms. Her questions were answered to her satisfaction and she elected to proceed with surgery.  OR FINDINGS: Very small, flat condyloma in the distal anal canal on the left lateral area of the anal canal.  No exophytic condyloma seen.  COMPLICATIONS: None  DESCRIPTION: The patient was identified in the preoperative holding area and taken to the OR where she was placed prone on the operating room table. SCDs were placed. Anesthesia was administered without difficulty. The patient was then positioned in prone jackknife position with buttocks gently taped apart.  The patient was then prepped and draped in usual sterile fashion.  A surgical timeout was performed indicating the correct patient, procedure, and positioning.  A rectal block was performed using a combination of 0.25% Marcaine+Exparel.  Digital rectal exam revealed no palpable abnormal.  A lubricated silver bullet retractor was placed in the anal canal was inspected circumferentially.  Flat condylomatous-like changes were seen on the left lateral region of the anal canal.  There was no exophytic component.  These were fulgurated.  One additional 2 mm condyloma was found on the perianal skin left posterior region and this was fulgurated.  Hemostasis was then verified.  No additional lesions were seen on exam.  4 x 4 gauze and ABD were applied.  The patient was then awakened and transferred to a stretcher for transport to PACU  in satisfactory condition

## 2017-07-31 NOTE — H&P (Signed)
H&P update  H&P from 07/06/17 was reviewed by myself with the patient. No new medications or issues. Denies complaints today  Vitals:   07/31/17 1025  BP: (!) 151/72  Pulse: 72  Resp: 16  Temp: 98.3 F (36.8 C)  TempSrc: Oral  SpO2: 94%   PLAN  OR for EUA, excision of anal condyloma, all other indicated procedures  The anatomy and physiology of the anus and rectum was discussed at length with the patient. The pathophysiology of condyloma was discussed at length with associated pictures and illustrations.  The planned procedure, material risks (including, but not limited to, pain, bleeding, infection, scarring, need for blood transfusion, damage to anal sphincter, incontinence of gas and/or stool, need for additional procedures, recurrence, pneumonia, heart attack, stroke, death) benefits and alternatives to surgery were discussed at length. I noted a good probability that the procedure would help improve their symptoms. Her questions were answered to her satisfaction and she elected to proceed with surgery.  Sharon Mt. Dema Severin, M.D. General and Colorectal Surgery [facility]

## 2017-07-31 NOTE — Anesthesia Preprocedure Evaluation (Signed)
Anesthesia Evaluation  Patient identified by MRN, date of birth, ID band Patient awake    Reviewed: Allergy & Precautions, H&P , NPO status , Patient's Chart, lab work & pertinent test results, reviewed documented beta blocker date and time   Airway Mallampati: II  TM Distance: >3 FB Neck ROM: full    Dental no notable dental hx.    Pulmonary shortness of breath, former smoker,    Pulmonary exam normal breath sounds clear to auscultation       Cardiovascular hypertension,  Rhythm:regular Rate:Normal     Neuro/Psych    GI/Hepatic   Endo/Other  diabetes, Type 2, Insulin DependentHyperthyroidism   Renal/GU ESRF and DialysisRenal disease     Musculoskeletal   Abdominal   Peds  Hematology  (+) anemia ,   Anesthesia Other Findings   Reproductive/Obstetrics                             Anesthesia Physical  Anesthesia Plan  ASA: III  Anesthesia Plan: MAC   Post-op Pain Management:    Induction: Intravenous  PONV Risk Score and Plan: 2 and Ondansetron, Dexamethasone and Treatment may vary due to age or medical condition  Airway Management Planned: Mask  Additional Equipment:   Intra-op Plan:   Post-operative Plan:   Informed Consent: I have reviewed the patients History and Physical, chart, labs and discussed the procedure including the risks, benefits and alternatives for the proposed anesthesia with the patient or authorized representative who has indicated his/her understanding and acceptance.   Dental Advisory Given  Plan Discussed with: Anesthesiologist, Surgeon and CRNA  Anesthesia Plan Comments:         Anesthesia Quick Evaluation

## 2017-07-31 NOTE — Anesthesia Postprocedure Evaluation (Signed)
Anesthesia Post Note  Patient: MALAYAH DEMURO  Procedure(s) Performed: ANAL EXAM UNDER ANESTHESIA (N/A ) EXCISION  FULGERATION OF ANAL CONDYLOMA (N/A )     Patient location during evaluation: PACU Anesthesia Type: MAC Level of consciousness: awake and alert Pain management: pain level controlled Vital Signs Assessment: post-procedure vital signs reviewed and stable Respiratory status: spontaneous breathing, nonlabored ventilation, respiratory function stable and patient connected to nasal cannula oxygen Cardiovascular status: stable and blood pressure returned to baseline Postop Assessment: no apparent nausea or vomiting Anesthetic complications: no    Last Vitals:  Vitals:   07/31/17 1317 07/31/17 1339  BP: (!) 154/81 (!) 150/68  Pulse: 67 73  Resp: 18 18  Temp: 36.5 C 36.7 C  SpO2: 96% 97%    Last Pain:  Vitals:   07/31/17 1318  TempSrc:   PainSc: 0-No pain                 Londa Mackowski EDWARD

## 2017-08-01 ENCOUNTER — Encounter (HOSPITAL_COMMUNITY): Payer: Self-pay | Admitting: Surgery

## 2017-08-02 DIAGNOSIS — N186 End stage renal disease: Secondary | ICD-10-CM | POA: Diagnosis not present

## 2017-08-02 DIAGNOSIS — N2581 Secondary hyperparathyroidism of renal origin: Secondary | ICD-10-CM | POA: Diagnosis not present

## 2017-08-02 DIAGNOSIS — D509 Iron deficiency anemia, unspecified: Secondary | ICD-10-CM | POA: Diagnosis not present

## 2017-08-04 DIAGNOSIS — N2581 Secondary hyperparathyroidism of renal origin: Secondary | ICD-10-CM | POA: Diagnosis not present

## 2017-08-04 DIAGNOSIS — D509 Iron deficiency anemia, unspecified: Secondary | ICD-10-CM | POA: Diagnosis not present

## 2017-08-04 DIAGNOSIS — N186 End stage renal disease: Secondary | ICD-10-CM | POA: Diagnosis not present

## 2017-08-06 DIAGNOSIS — D509 Iron deficiency anemia, unspecified: Secondary | ICD-10-CM | POA: Diagnosis not present

## 2017-08-06 DIAGNOSIS — N186 End stage renal disease: Secondary | ICD-10-CM | POA: Diagnosis not present

## 2017-08-06 DIAGNOSIS — N2581 Secondary hyperparathyroidism of renal origin: Secondary | ICD-10-CM | POA: Diagnosis not present

## 2017-08-09 DIAGNOSIS — N2581 Secondary hyperparathyroidism of renal origin: Secondary | ICD-10-CM | POA: Diagnosis not present

## 2017-08-09 DIAGNOSIS — D509 Iron deficiency anemia, unspecified: Secondary | ICD-10-CM | POA: Diagnosis not present

## 2017-08-09 DIAGNOSIS — N186 End stage renal disease: Secondary | ICD-10-CM | POA: Diagnosis not present

## 2017-08-11 DIAGNOSIS — N186 End stage renal disease: Secondary | ICD-10-CM | POA: Diagnosis not present

## 2017-08-11 DIAGNOSIS — N2581 Secondary hyperparathyroidism of renal origin: Secondary | ICD-10-CM | POA: Diagnosis not present

## 2017-08-11 DIAGNOSIS — D509 Iron deficiency anemia, unspecified: Secondary | ICD-10-CM | POA: Diagnosis not present

## 2017-08-13 DIAGNOSIS — N186 End stage renal disease: Secondary | ICD-10-CM | POA: Diagnosis not present

## 2017-08-13 DIAGNOSIS — D509 Iron deficiency anemia, unspecified: Secondary | ICD-10-CM | POA: Diagnosis not present

## 2017-08-13 DIAGNOSIS — N2581 Secondary hyperparathyroidism of renal origin: Secondary | ICD-10-CM | POA: Diagnosis not present

## 2017-08-16 DIAGNOSIS — N2581 Secondary hyperparathyroidism of renal origin: Secondary | ICD-10-CM | POA: Diagnosis not present

## 2017-08-16 DIAGNOSIS — N186 End stage renal disease: Secondary | ICD-10-CM | POA: Diagnosis not present

## 2017-08-16 DIAGNOSIS — D509 Iron deficiency anemia, unspecified: Secondary | ICD-10-CM | POA: Diagnosis not present

## 2017-08-18 DIAGNOSIS — N2581 Secondary hyperparathyroidism of renal origin: Secondary | ICD-10-CM | POA: Diagnosis not present

## 2017-08-18 DIAGNOSIS — N186 End stage renal disease: Secondary | ICD-10-CM | POA: Diagnosis not present

## 2017-08-18 DIAGNOSIS — D509 Iron deficiency anemia, unspecified: Secondary | ICD-10-CM | POA: Diagnosis not present

## 2017-08-20 DIAGNOSIS — N2581 Secondary hyperparathyroidism of renal origin: Secondary | ICD-10-CM | POA: Diagnosis not present

## 2017-08-20 DIAGNOSIS — D509 Iron deficiency anemia, unspecified: Secondary | ICD-10-CM | POA: Diagnosis not present

## 2017-08-20 DIAGNOSIS — N186 End stage renal disease: Secondary | ICD-10-CM | POA: Diagnosis not present

## 2017-08-22 DIAGNOSIS — N186 End stage renal disease: Secondary | ICD-10-CM | POA: Diagnosis not present

## 2017-08-22 DIAGNOSIS — N2581 Secondary hyperparathyroidism of renal origin: Secondary | ICD-10-CM | POA: Diagnosis not present

## 2017-08-22 DIAGNOSIS — D631 Anemia in chronic kidney disease: Secondary | ICD-10-CM | POA: Diagnosis not present

## 2017-08-25 DIAGNOSIS — N186 End stage renal disease: Secondary | ICD-10-CM | POA: Diagnosis not present

## 2017-08-25 DIAGNOSIS — D631 Anemia in chronic kidney disease: Secondary | ICD-10-CM | POA: Diagnosis not present

## 2017-08-25 DIAGNOSIS — N2581 Secondary hyperparathyroidism of renal origin: Secondary | ICD-10-CM | POA: Diagnosis not present

## 2017-08-27 DIAGNOSIS — N186 End stage renal disease: Secondary | ICD-10-CM | POA: Diagnosis not present

## 2017-08-27 DIAGNOSIS — D631 Anemia in chronic kidney disease: Secondary | ICD-10-CM | POA: Diagnosis not present

## 2017-08-27 DIAGNOSIS — N2581 Secondary hyperparathyroidism of renal origin: Secondary | ICD-10-CM | POA: Diagnosis not present

## 2017-08-30 DIAGNOSIS — D631 Anemia in chronic kidney disease: Secondary | ICD-10-CM | POA: Diagnosis not present

## 2017-08-30 DIAGNOSIS — N2581 Secondary hyperparathyroidism of renal origin: Secondary | ICD-10-CM | POA: Diagnosis not present

## 2017-08-30 DIAGNOSIS — N186 End stage renal disease: Secondary | ICD-10-CM | POA: Diagnosis not present

## 2017-09-01 DIAGNOSIS — N186 End stage renal disease: Secondary | ICD-10-CM | POA: Diagnosis not present

## 2017-09-01 DIAGNOSIS — D631 Anemia in chronic kidney disease: Secondary | ICD-10-CM | POA: Diagnosis not present

## 2017-09-01 DIAGNOSIS — N2581 Secondary hyperparathyroidism of renal origin: Secondary | ICD-10-CM | POA: Diagnosis not present

## 2017-09-02 DIAGNOSIS — A630 Anogenital (venereal) warts: Secondary | ICD-10-CM | POA: Diagnosis not present

## 2017-09-03 DIAGNOSIS — N186 End stage renal disease: Secondary | ICD-10-CM | POA: Diagnosis not present

## 2017-09-03 DIAGNOSIS — N2581 Secondary hyperparathyroidism of renal origin: Secondary | ICD-10-CM | POA: Diagnosis not present

## 2017-09-03 DIAGNOSIS — D631 Anemia in chronic kidney disease: Secondary | ICD-10-CM | POA: Diagnosis not present

## 2017-09-06 DIAGNOSIS — N2581 Secondary hyperparathyroidism of renal origin: Secondary | ICD-10-CM | POA: Diagnosis not present

## 2017-09-06 DIAGNOSIS — D631 Anemia in chronic kidney disease: Secondary | ICD-10-CM | POA: Diagnosis not present

## 2017-09-06 DIAGNOSIS — N186 End stage renal disease: Secondary | ICD-10-CM | POA: Diagnosis not present

## 2017-09-08 DIAGNOSIS — D631 Anemia in chronic kidney disease: Secondary | ICD-10-CM | POA: Diagnosis not present

## 2017-09-08 DIAGNOSIS — N186 End stage renal disease: Secondary | ICD-10-CM | POA: Diagnosis not present

## 2017-09-08 DIAGNOSIS — N2581 Secondary hyperparathyroidism of renal origin: Secondary | ICD-10-CM | POA: Diagnosis not present

## 2017-09-10 DIAGNOSIS — N2581 Secondary hyperparathyroidism of renal origin: Secondary | ICD-10-CM | POA: Diagnosis not present

## 2017-09-10 DIAGNOSIS — N186 End stage renal disease: Secondary | ICD-10-CM | POA: Diagnosis not present

## 2017-09-10 DIAGNOSIS — D631 Anemia in chronic kidney disease: Secondary | ICD-10-CM | POA: Diagnosis not present

## 2017-09-13 DIAGNOSIS — D631 Anemia in chronic kidney disease: Secondary | ICD-10-CM | POA: Diagnosis not present

## 2017-09-13 DIAGNOSIS — N186 End stage renal disease: Secondary | ICD-10-CM | POA: Diagnosis not present

## 2017-09-13 DIAGNOSIS — N2581 Secondary hyperparathyroidism of renal origin: Secondary | ICD-10-CM | POA: Diagnosis not present

## 2017-09-14 DIAGNOSIS — D631 Anemia in chronic kidney disease: Secondary | ICD-10-CM | POA: Diagnosis not present

## 2017-09-14 DIAGNOSIS — N186 End stage renal disease: Secondary | ICD-10-CM | POA: Diagnosis not present

## 2017-09-14 DIAGNOSIS — N2581 Secondary hyperparathyroidism of renal origin: Secondary | ICD-10-CM | POA: Diagnosis not present

## 2017-09-15 ENCOUNTER — Other Ambulatory Visit: Payer: Self-pay | Admitting: Surgery

## 2017-09-15 DIAGNOSIS — D631 Anemia in chronic kidney disease: Secondary | ICD-10-CM | POA: Diagnosis not present

## 2017-09-15 DIAGNOSIS — N186 End stage renal disease: Secondary | ICD-10-CM | POA: Diagnosis not present

## 2017-09-15 DIAGNOSIS — N2581 Secondary hyperparathyroidism of renal origin: Secondary | ICD-10-CM | POA: Diagnosis not present

## 2017-09-15 DIAGNOSIS — E042 Nontoxic multinodular goiter: Secondary | ICD-10-CM

## 2017-09-17 DIAGNOSIS — D631 Anemia in chronic kidney disease: Secondary | ICD-10-CM | POA: Diagnosis not present

## 2017-09-17 DIAGNOSIS — N186 End stage renal disease: Secondary | ICD-10-CM | POA: Diagnosis not present

## 2017-09-17 DIAGNOSIS — N2581 Secondary hyperparathyroidism of renal origin: Secondary | ICD-10-CM | POA: Diagnosis not present

## 2017-09-20 DIAGNOSIS — N2581 Secondary hyperparathyroidism of renal origin: Secondary | ICD-10-CM | POA: Diagnosis not present

## 2017-09-20 DIAGNOSIS — T8610 Unspecified complication of kidney transplant: Secondary | ICD-10-CM | POA: Diagnosis not present

## 2017-09-20 DIAGNOSIS — D631 Anemia in chronic kidney disease: Secondary | ICD-10-CM | POA: Diagnosis not present

## 2017-09-20 DIAGNOSIS — N186 End stage renal disease: Secondary | ICD-10-CM | POA: Diagnosis not present

## 2017-09-20 DIAGNOSIS — Z992 Dependence on renal dialysis: Secondary | ICD-10-CM | POA: Diagnosis not present

## 2017-09-21 DIAGNOSIS — N186 End stage renal disease: Secondary | ICD-10-CM | POA: Diagnosis not present

## 2017-09-21 DIAGNOSIS — Z992 Dependence on renal dialysis: Secondary | ICD-10-CM | POA: Diagnosis not present

## 2017-09-21 DIAGNOSIS — T8610 Unspecified complication of kidney transplant: Secondary | ICD-10-CM | POA: Diagnosis not present

## 2017-09-22 DIAGNOSIS — D509 Iron deficiency anemia, unspecified: Secondary | ICD-10-CM | POA: Diagnosis not present

## 2017-09-22 DIAGNOSIS — Z23 Encounter for immunization: Secondary | ICD-10-CM | POA: Diagnosis not present

## 2017-09-22 DIAGNOSIS — N2581 Secondary hyperparathyroidism of renal origin: Secondary | ICD-10-CM | POA: Diagnosis not present

## 2017-09-22 DIAGNOSIS — N186 End stage renal disease: Secondary | ICD-10-CM | POA: Diagnosis not present

## 2017-09-23 ENCOUNTER — Other Ambulatory Visit: Payer: Medicare Other

## 2017-09-24 DIAGNOSIS — D509 Iron deficiency anemia, unspecified: Secondary | ICD-10-CM | POA: Diagnosis not present

## 2017-09-24 DIAGNOSIS — N186 End stage renal disease: Secondary | ICD-10-CM | POA: Diagnosis not present

## 2017-09-24 DIAGNOSIS — N2581 Secondary hyperparathyroidism of renal origin: Secondary | ICD-10-CM | POA: Diagnosis not present

## 2017-09-24 DIAGNOSIS — Z23 Encounter for immunization: Secondary | ICD-10-CM | POA: Diagnosis not present

## 2017-09-27 DIAGNOSIS — N2581 Secondary hyperparathyroidism of renal origin: Secondary | ICD-10-CM | POA: Diagnosis not present

## 2017-09-27 DIAGNOSIS — N186 End stage renal disease: Secondary | ICD-10-CM | POA: Diagnosis not present

## 2017-09-27 DIAGNOSIS — D509 Iron deficiency anemia, unspecified: Secondary | ICD-10-CM | POA: Diagnosis not present

## 2017-09-27 DIAGNOSIS — Z23 Encounter for immunization: Secondary | ICD-10-CM | POA: Diagnosis not present

## 2017-09-28 ENCOUNTER — Ambulatory Visit
Admission: RE | Admit: 2017-09-28 | Discharge: 2017-09-28 | Disposition: A | Discharge status: Home / Self Care | Payer: Medicare Other | Source: Ambulatory Visit | Attending: Surgery | Admitting: Surgery

## 2017-09-28 DIAGNOSIS — E042 Nontoxic multinodular goiter: Secondary | ICD-10-CM

## 2017-09-29 DIAGNOSIS — D509 Iron deficiency anemia, unspecified: Secondary | ICD-10-CM | POA: Diagnosis not present

## 2017-09-29 DIAGNOSIS — N186 End stage renal disease: Secondary | ICD-10-CM | POA: Diagnosis not present

## 2017-09-29 DIAGNOSIS — Z23 Encounter for immunization: Secondary | ICD-10-CM | POA: Diagnosis not present

## 2017-09-29 DIAGNOSIS — N2581 Secondary hyperparathyroidism of renal origin: Secondary | ICD-10-CM | POA: Diagnosis not present

## 2017-10-01 DIAGNOSIS — N2581 Secondary hyperparathyroidism of renal origin: Secondary | ICD-10-CM | POA: Diagnosis not present

## 2017-10-01 DIAGNOSIS — Z23 Encounter for immunization: Secondary | ICD-10-CM | POA: Diagnosis not present

## 2017-10-01 DIAGNOSIS — D509 Iron deficiency anemia, unspecified: Secondary | ICD-10-CM | POA: Diagnosis not present

## 2017-10-01 DIAGNOSIS — N186 End stage renal disease: Secondary | ICD-10-CM | POA: Diagnosis not present

## 2017-10-04 DIAGNOSIS — E1129 Type 2 diabetes mellitus with other diabetic kidney complication: Secondary | ICD-10-CM | POA: Diagnosis not present

## 2017-10-04 DIAGNOSIS — Z23 Encounter for immunization: Secondary | ICD-10-CM | POA: Diagnosis not present

## 2017-10-04 DIAGNOSIS — N186 End stage renal disease: Secondary | ICD-10-CM | POA: Diagnosis not present

## 2017-10-04 DIAGNOSIS — N2581 Secondary hyperparathyroidism of renal origin: Secondary | ICD-10-CM | POA: Diagnosis not present

## 2017-10-04 DIAGNOSIS — D509 Iron deficiency anemia, unspecified: Secondary | ICD-10-CM | POA: Diagnosis not present

## 2017-10-05 ENCOUNTER — Encounter: Payer: Self-pay | Admitting: Podiatry

## 2017-10-05 ENCOUNTER — Ambulatory Visit (INDEPENDENT_AMBULATORY_CARE_PROVIDER_SITE_OTHER): Payer: Medicare Other | Admitting: Podiatry

## 2017-10-05 VITALS — BP 126/70 | HR 68 | Resp 16

## 2017-10-05 DIAGNOSIS — L600 Ingrowing nail: Secondary | ICD-10-CM | POA: Diagnosis not present

## 2017-10-05 NOTE — Patient Instructions (Signed)

## 2017-10-06 DIAGNOSIS — N186 End stage renal disease: Secondary | ICD-10-CM | POA: Diagnosis not present

## 2017-10-06 DIAGNOSIS — N2581 Secondary hyperparathyroidism of renal origin: Secondary | ICD-10-CM | POA: Diagnosis not present

## 2017-10-06 DIAGNOSIS — D509 Iron deficiency anemia, unspecified: Secondary | ICD-10-CM | POA: Diagnosis not present

## 2017-10-06 DIAGNOSIS — Z23 Encounter for immunization: Secondary | ICD-10-CM | POA: Diagnosis not present

## 2017-10-07 NOTE — Progress Notes (Signed)
Subjective:   Patient ID: Kimberly [name], female   DOB: 62 y.o.   MRN: [id number]   HPI Patient presents with chronic ingrown toenail of the big toe both feet that have been sore and making it hard for her to wear shoe gear comfortably.  States she is tried to soak and trim herself without relief and patient does not currently smoke and likes to be active   Review of Systems  All other systems reviewed and are negative.       Objective:  Physical Exam  Constitutional: She appears well-developed and well-nourished.  Cardiovascular: Intact distal pulses.  Pulmonary/Chest: Effort normal.  Musculoskeletal: Normal range of motion.  Neurological: She is alert.  Skin: Skin is warm.  Nursing note and vitals reviewed.   Neurovascular status intact muscle strength is adequate range of motion within normal limits with patient found to have incurvated medial borders left and right hallux with pain and no redness or drainage noted.  Patient has structural issue of the nailbeds creating irritation and pain of the corners.  Patient was found to have good digital perfusion well oriented x3     Assessment:  Ingrown toenail deformity hallux left and right medial borders with pain     Plan:  H&P conditions reviewed and recommended removal of the borders.  Patient wants surgery and I explained procedures and risk and allow patient signed consent form understanding the risk of the procedure.  I infiltrated each hallux 60 mg like Marcaine mixture sterile prep applied to each big toe and remove the medial borders exposing matrix and applied phenol 3 applications 30 seconds followed by alcohol lavage sterile dressing.  Gave instructions on soaks and reappoint

## 2017-10-08 DIAGNOSIS — D509 Iron deficiency anemia, unspecified: Secondary | ICD-10-CM | POA: Diagnosis not present

## 2017-10-08 DIAGNOSIS — N2581 Secondary hyperparathyroidism of renal origin: Secondary | ICD-10-CM | POA: Diagnosis not present

## 2017-10-08 DIAGNOSIS — Z23 Encounter for immunization: Secondary | ICD-10-CM | POA: Diagnosis not present

## 2017-10-08 DIAGNOSIS — N186 End stage renal disease: Secondary | ICD-10-CM | POA: Diagnosis not present

## 2017-10-09 DIAGNOSIS — Z23 Encounter for immunization: Secondary | ICD-10-CM | POA: Diagnosis not present

## 2017-10-09 DIAGNOSIS — N2581 Secondary hyperparathyroidism of renal origin: Secondary | ICD-10-CM | POA: Diagnosis not present

## 2017-10-09 DIAGNOSIS — N186 End stage renal disease: Secondary | ICD-10-CM | POA: Diagnosis not present

## 2017-10-09 DIAGNOSIS — D509 Iron deficiency anemia, unspecified: Secondary | ICD-10-CM | POA: Diagnosis not present

## 2017-10-11 DIAGNOSIS — Z23 Encounter for immunization: Secondary | ICD-10-CM | POA: Diagnosis not present

## 2017-10-11 DIAGNOSIS — N2581 Secondary hyperparathyroidism of renal origin: Secondary | ICD-10-CM | POA: Diagnosis not present

## 2017-10-11 DIAGNOSIS — D509 Iron deficiency anemia, unspecified: Secondary | ICD-10-CM | POA: Diagnosis not present

## 2017-10-11 DIAGNOSIS — N186 End stage renal disease: Secondary | ICD-10-CM | POA: Diagnosis not present

## 2017-10-13 DIAGNOSIS — N2581 Secondary hyperparathyroidism of renal origin: Secondary | ICD-10-CM | POA: Diagnosis not present

## 2017-10-13 DIAGNOSIS — D509 Iron deficiency anemia, unspecified: Secondary | ICD-10-CM | POA: Diagnosis not present

## 2017-10-13 DIAGNOSIS — N186 End stage renal disease: Secondary | ICD-10-CM | POA: Diagnosis not present

## 2017-10-13 DIAGNOSIS — Z23 Encounter for immunization: Secondary | ICD-10-CM | POA: Diagnosis not present

## 2017-10-15 DIAGNOSIS — D509 Iron deficiency anemia, unspecified: Secondary | ICD-10-CM | POA: Diagnosis not present

## 2017-10-15 DIAGNOSIS — N2581 Secondary hyperparathyroidism of renal origin: Secondary | ICD-10-CM | POA: Diagnosis not present

## 2017-10-15 DIAGNOSIS — N186 End stage renal disease: Secondary | ICD-10-CM | POA: Diagnosis not present

## 2017-10-15 DIAGNOSIS — Z23 Encounter for immunization: Secondary | ICD-10-CM | POA: Diagnosis not present

## 2017-10-18 DIAGNOSIS — Z23 Encounter for immunization: Secondary | ICD-10-CM | POA: Diagnosis not present

## 2017-10-18 DIAGNOSIS — N186 End stage renal disease: Secondary | ICD-10-CM | POA: Diagnosis not present

## 2017-10-18 DIAGNOSIS — D509 Iron deficiency anemia, unspecified: Secondary | ICD-10-CM | POA: Diagnosis not present

## 2017-10-18 DIAGNOSIS — N2581 Secondary hyperparathyroidism of renal origin: Secondary | ICD-10-CM | POA: Diagnosis not present

## 2017-10-19 DIAGNOSIS — N186 End stage renal disease: Secondary | ICD-10-CM | POA: Diagnosis not present

## 2017-10-19 DIAGNOSIS — N2581 Secondary hyperparathyroidism of renal origin: Secondary | ICD-10-CM | POA: Diagnosis not present

## 2017-10-19 DIAGNOSIS — D509 Iron deficiency anemia, unspecified: Secondary | ICD-10-CM | POA: Diagnosis not present

## 2017-10-19 DIAGNOSIS — Z23 Encounter for immunization: Secondary | ICD-10-CM | POA: Diagnosis not present

## 2017-10-20 DIAGNOSIS — Z23 Encounter for immunization: Secondary | ICD-10-CM | POA: Diagnosis not present

## 2017-10-20 DIAGNOSIS — N2581 Secondary hyperparathyroidism of renal origin: Secondary | ICD-10-CM | POA: Diagnosis not present

## 2017-10-20 DIAGNOSIS — D509 Iron deficiency anemia, unspecified: Secondary | ICD-10-CM | POA: Diagnosis not present

## 2017-10-20 DIAGNOSIS — N186 End stage renal disease: Secondary | ICD-10-CM | POA: Diagnosis not present

## 2017-10-21 DIAGNOSIS — Z992 Dependence on renal dialysis: Secondary | ICD-10-CM | POA: Diagnosis not present

## 2017-10-21 DIAGNOSIS — T8610 Unspecified complication of kidney transplant: Secondary | ICD-10-CM | POA: Diagnosis not present

## 2017-10-21 DIAGNOSIS — N186 End stage renal disease: Secondary | ICD-10-CM | POA: Diagnosis not present

## 2017-10-22 DIAGNOSIS — D509 Iron deficiency anemia, unspecified: Secondary | ICD-10-CM | POA: Diagnosis not present

## 2017-10-22 DIAGNOSIS — Z4931 Encounter for adequacy testing for hemodialysis: Secondary | ICD-10-CM | POA: Diagnosis not present

## 2017-10-22 DIAGNOSIS — N2581 Secondary hyperparathyroidism of renal origin: Secondary | ICD-10-CM | POA: Diagnosis not present

## 2017-10-22 DIAGNOSIS — N186 End stage renal disease: Secondary | ICD-10-CM | POA: Diagnosis not present

## 2017-10-22 DIAGNOSIS — D631 Anemia in chronic kidney disease: Secondary | ICD-10-CM | POA: Diagnosis not present

## 2017-10-25 DIAGNOSIS — N2581 Secondary hyperparathyroidism of renal origin: Secondary | ICD-10-CM | POA: Diagnosis not present

## 2017-10-25 DIAGNOSIS — Z4931 Encounter for adequacy testing for hemodialysis: Secondary | ICD-10-CM | POA: Diagnosis not present

## 2017-10-25 DIAGNOSIS — D509 Iron deficiency anemia, unspecified: Secondary | ICD-10-CM | POA: Diagnosis not present

## 2017-10-25 DIAGNOSIS — N186 End stage renal disease: Secondary | ICD-10-CM | POA: Diagnosis not present

## 2017-10-25 DIAGNOSIS — D631 Anemia in chronic kidney disease: Secondary | ICD-10-CM | POA: Diagnosis not present

## 2017-10-26 DIAGNOSIS — D509 Iron deficiency anemia, unspecified: Secondary | ICD-10-CM | POA: Diagnosis not present

## 2017-10-26 DIAGNOSIS — D631 Anemia in chronic kidney disease: Secondary | ICD-10-CM | POA: Diagnosis not present

## 2017-10-26 DIAGNOSIS — N186 End stage renal disease: Secondary | ICD-10-CM | POA: Diagnosis not present

## 2017-10-26 DIAGNOSIS — N2581 Secondary hyperparathyroidism of renal origin: Secondary | ICD-10-CM | POA: Diagnosis not present

## 2017-10-26 DIAGNOSIS — Z4931 Encounter for adequacy testing for hemodialysis: Secondary | ICD-10-CM | POA: Diagnosis not present

## 2017-10-27 DIAGNOSIS — D631 Anemia in chronic kidney disease: Secondary | ICD-10-CM | POA: Diagnosis not present

## 2017-10-27 DIAGNOSIS — N186 End stage renal disease: Secondary | ICD-10-CM | POA: Diagnosis not present

## 2017-10-27 DIAGNOSIS — D509 Iron deficiency anemia, unspecified: Secondary | ICD-10-CM | POA: Diagnosis not present

## 2017-10-27 DIAGNOSIS — Z4931 Encounter for adequacy testing for hemodialysis: Secondary | ICD-10-CM | POA: Diagnosis not present

## 2017-10-27 DIAGNOSIS — N2581 Secondary hyperparathyroidism of renal origin: Secondary | ICD-10-CM | POA: Diagnosis not present

## 2017-10-29 DIAGNOSIS — D509 Iron deficiency anemia, unspecified: Secondary | ICD-10-CM | POA: Diagnosis not present

## 2017-10-29 DIAGNOSIS — N2581 Secondary hyperparathyroidism of renal origin: Secondary | ICD-10-CM | POA: Diagnosis not present

## 2017-10-29 DIAGNOSIS — Z4931 Encounter for adequacy testing for hemodialysis: Secondary | ICD-10-CM | POA: Diagnosis not present

## 2017-10-29 DIAGNOSIS — D631 Anemia in chronic kidney disease: Secondary | ICD-10-CM | POA: Diagnosis not present

## 2017-10-29 DIAGNOSIS — N186 End stage renal disease: Secondary | ICD-10-CM | POA: Diagnosis not present

## 2017-11-01 DIAGNOSIS — N186 End stage renal disease: Secondary | ICD-10-CM | POA: Diagnosis not present

## 2017-11-01 DIAGNOSIS — D509 Iron deficiency anemia, unspecified: Secondary | ICD-10-CM | POA: Diagnosis not present

## 2017-11-01 DIAGNOSIS — N2581 Secondary hyperparathyroidism of renal origin: Secondary | ICD-10-CM | POA: Diagnosis not present

## 2017-11-01 DIAGNOSIS — D631 Anemia in chronic kidney disease: Secondary | ICD-10-CM | POA: Diagnosis not present

## 2017-11-01 DIAGNOSIS — Z4931 Encounter for adequacy testing for hemodialysis: Secondary | ICD-10-CM | POA: Diagnosis not present

## 2017-11-02 DIAGNOSIS — E113291 Type 2 diabetes mellitus with mild nonproliferative diabetic retinopathy without macular edema, right eye: Secondary | ICD-10-CM | POA: Diagnosis not present

## 2017-11-02 DIAGNOSIS — D899 Disorder involving the immune mechanism, unspecified: Secondary | ICD-10-CM | POA: Diagnosis not present

## 2017-11-02 DIAGNOSIS — Z940 Kidney transplant status: Secondary | ICD-10-CM | POA: Diagnosis not present

## 2017-11-02 DIAGNOSIS — E113292 Type 2 diabetes mellitus with mild nonproliferative diabetic retinopathy without macular edema, left eye: Secondary | ICD-10-CM | POA: Diagnosis not present

## 2017-11-03 DIAGNOSIS — N2581 Secondary hyperparathyroidism of renal origin: Secondary | ICD-10-CM | POA: Diagnosis not present

## 2017-11-03 DIAGNOSIS — Z4931 Encounter for adequacy testing for hemodialysis: Secondary | ICD-10-CM | POA: Diagnosis not present

## 2017-11-03 DIAGNOSIS — N186 End stage renal disease: Secondary | ICD-10-CM | POA: Diagnosis not present

## 2017-11-03 DIAGNOSIS — D509 Iron deficiency anemia, unspecified: Secondary | ICD-10-CM | POA: Diagnosis not present

## 2017-11-03 DIAGNOSIS — D631 Anemia in chronic kidney disease: Secondary | ICD-10-CM | POA: Diagnosis not present

## 2017-11-05 DIAGNOSIS — D631 Anemia in chronic kidney disease: Secondary | ICD-10-CM | POA: Diagnosis not present

## 2017-11-05 DIAGNOSIS — N2581 Secondary hyperparathyroidism of renal origin: Secondary | ICD-10-CM | POA: Diagnosis not present

## 2017-11-05 DIAGNOSIS — D509 Iron deficiency anemia, unspecified: Secondary | ICD-10-CM | POA: Diagnosis not present

## 2017-11-05 DIAGNOSIS — Z4931 Encounter for adequacy testing for hemodialysis: Secondary | ICD-10-CM | POA: Diagnosis not present

## 2017-11-05 DIAGNOSIS — N186 End stage renal disease: Secondary | ICD-10-CM | POA: Diagnosis not present

## 2017-11-08 DIAGNOSIS — N2581 Secondary hyperparathyroidism of renal origin: Secondary | ICD-10-CM | POA: Diagnosis not present

## 2017-11-08 DIAGNOSIS — N186 End stage renal disease: Secondary | ICD-10-CM | POA: Diagnosis not present

## 2017-11-08 DIAGNOSIS — D631 Anemia in chronic kidney disease: Secondary | ICD-10-CM | POA: Diagnosis not present

## 2017-11-08 DIAGNOSIS — D509 Iron deficiency anemia, unspecified: Secondary | ICD-10-CM | POA: Diagnosis not present

## 2017-11-08 DIAGNOSIS — Z4931 Encounter for adequacy testing for hemodialysis: Secondary | ICD-10-CM | POA: Diagnosis not present

## 2017-11-10 DIAGNOSIS — N2581 Secondary hyperparathyroidism of renal origin: Secondary | ICD-10-CM | POA: Diagnosis not present

## 2017-11-10 DIAGNOSIS — N186 End stage renal disease: Secondary | ICD-10-CM | POA: Diagnosis not present

## 2017-11-10 DIAGNOSIS — Z4931 Encounter for adequacy testing for hemodialysis: Secondary | ICD-10-CM | POA: Diagnosis not present

## 2017-11-10 DIAGNOSIS — D509 Iron deficiency anemia, unspecified: Secondary | ICD-10-CM | POA: Diagnosis not present

## 2017-11-10 DIAGNOSIS — D631 Anemia in chronic kidney disease: Secondary | ICD-10-CM | POA: Diagnosis not present

## 2017-11-11 DIAGNOSIS — N186 End stage renal disease: Secondary | ICD-10-CM | POA: Diagnosis not present

## 2017-11-11 DIAGNOSIS — D509 Iron deficiency anemia, unspecified: Secondary | ICD-10-CM | POA: Diagnosis not present

## 2017-11-11 DIAGNOSIS — D631 Anemia in chronic kidney disease: Secondary | ICD-10-CM | POA: Diagnosis not present

## 2017-11-11 DIAGNOSIS — Z4931 Encounter for adequacy testing for hemodialysis: Secondary | ICD-10-CM | POA: Diagnosis not present

## 2017-11-11 DIAGNOSIS — N2581 Secondary hyperparathyroidism of renal origin: Secondary | ICD-10-CM | POA: Diagnosis not present

## 2017-11-12 DIAGNOSIS — N186 End stage renal disease: Secondary | ICD-10-CM | POA: Diagnosis not present

## 2017-11-12 DIAGNOSIS — Z4931 Encounter for adequacy testing for hemodialysis: Secondary | ICD-10-CM | POA: Diagnosis not present

## 2017-11-12 DIAGNOSIS — D631 Anemia in chronic kidney disease: Secondary | ICD-10-CM | POA: Diagnosis not present

## 2017-11-12 DIAGNOSIS — N2581 Secondary hyperparathyroidism of renal origin: Secondary | ICD-10-CM | POA: Diagnosis not present

## 2017-11-12 DIAGNOSIS — D509 Iron deficiency anemia, unspecified: Secondary | ICD-10-CM | POA: Diagnosis not present

## 2017-11-15 DIAGNOSIS — D631 Anemia in chronic kidney disease: Secondary | ICD-10-CM | POA: Diagnosis not present

## 2017-11-15 DIAGNOSIS — N186 End stage renal disease: Secondary | ICD-10-CM | POA: Diagnosis not present

## 2017-11-15 DIAGNOSIS — D509 Iron deficiency anemia, unspecified: Secondary | ICD-10-CM | POA: Diagnosis not present

## 2017-11-15 DIAGNOSIS — N2581 Secondary hyperparathyroidism of renal origin: Secondary | ICD-10-CM | POA: Diagnosis not present

## 2017-11-15 DIAGNOSIS — Z4931 Encounter for adequacy testing for hemodialysis: Secondary | ICD-10-CM | POA: Diagnosis not present

## 2017-11-17 DIAGNOSIS — D631 Anemia in chronic kidney disease: Secondary | ICD-10-CM | POA: Diagnosis not present

## 2017-11-17 DIAGNOSIS — N2581 Secondary hyperparathyroidism of renal origin: Secondary | ICD-10-CM | POA: Diagnosis not present

## 2017-11-17 DIAGNOSIS — Z4931 Encounter for adequacy testing for hemodialysis: Secondary | ICD-10-CM | POA: Diagnosis not present

## 2017-11-17 DIAGNOSIS — D509 Iron deficiency anemia, unspecified: Secondary | ICD-10-CM | POA: Diagnosis not present

## 2017-11-17 DIAGNOSIS — N186 End stage renal disease: Secondary | ICD-10-CM | POA: Diagnosis not present

## 2017-11-19 DIAGNOSIS — D509 Iron deficiency anemia, unspecified: Secondary | ICD-10-CM | POA: Diagnosis not present

## 2017-11-19 DIAGNOSIS — N2581 Secondary hyperparathyroidism of renal origin: Secondary | ICD-10-CM | POA: Diagnosis not present

## 2017-11-19 DIAGNOSIS — N186 End stage renal disease: Secondary | ICD-10-CM | POA: Diagnosis not present

## 2017-11-19 DIAGNOSIS — Z4931 Encounter for adequacy testing for hemodialysis: Secondary | ICD-10-CM | POA: Diagnosis not present

## 2017-11-19 DIAGNOSIS — D631 Anemia in chronic kidney disease: Secondary | ICD-10-CM | POA: Diagnosis not present

## 2017-11-21 DIAGNOSIS — T8610 Unspecified complication of kidney transplant: Secondary | ICD-10-CM | POA: Diagnosis not present

## 2017-11-21 DIAGNOSIS — N186 End stage renal disease: Secondary | ICD-10-CM | POA: Diagnosis not present

## 2017-11-21 DIAGNOSIS — Z992 Dependence on renal dialysis: Secondary | ICD-10-CM | POA: Diagnosis not present

## 2017-11-22 DIAGNOSIS — N2581 Secondary hyperparathyroidism of renal origin: Secondary | ICD-10-CM | POA: Diagnosis not present

## 2017-11-22 DIAGNOSIS — N186 End stage renal disease: Secondary | ICD-10-CM | POA: Diagnosis not present

## 2017-11-22 DIAGNOSIS — D509 Iron deficiency anemia, unspecified: Secondary | ICD-10-CM | POA: Diagnosis not present

## 2017-11-23 DIAGNOSIS — N2581 Secondary hyperparathyroidism of renal origin: Secondary | ICD-10-CM | POA: Diagnosis not present

## 2017-11-23 DIAGNOSIS — N186 End stage renal disease: Secondary | ICD-10-CM | POA: Diagnosis not present

## 2017-11-23 DIAGNOSIS — D509 Iron deficiency anemia, unspecified: Secondary | ICD-10-CM | POA: Diagnosis not present

## 2017-11-24 DIAGNOSIS — N186 End stage renal disease: Secondary | ICD-10-CM | POA: Diagnosis not present

## 2017-11-24 DIAGNOSIS — N2581 Secondary hyperparathyroidism of renal origin: Secondary | ICD-10-CM | POA: Diagnosis not present

## 2017-11-24 DIAGNOSIS — D509 Iron deficiency anemia, unspecified: Secondary | ICD-10-CM | POA: Diagnosis not present

## 2017-11-26 DIAGNOSIS — N186 End stage renal disease: Secondary | ICD-10-CM | POA: Diagnosis not present

## 2017-11-26 DIAGNOSIS — D509 Iron deficiency anemia, unspecified: Secondary | ICD-10-CM | POA: Diagnosis not present

## 2017-11-26 DIAGNOSIS — N2581 Secondary hyperparathyroidism of renal origin: Secondary | ICD-10-CM | POA: Diagnosis not present

## 2017-11-29 DIAGNOSIS — D509 Iron deficiency anemia, unspecified: Secondary | ICD-10-CM | POA: Diagnosis not present

## 2017-11-29 DIAGNOSIS — N186 End stage renal disease: Secondary | ICD-10-CM | POA: Diagnosis not present

## 2017-11-29 DIAGNOSIS — N2581 Secondary hyperparathyroidism of renal origin: Secondary | ICD-10-CM | POA: Diagnosis not present

## 2017-12-01 DIAGNOSIS — N186 End stage renal disease: Secondary | ICD-10-CM | POA: Diagnosis not present

## 2017-12-01 DIAGNOSIS — D509 Iron deficiency anemia, unspecified: Secondary | ICD-10-CM | POA: Diagnosis not present

## 2017-12-01 DIAGNOSIS — N2581 Secondary hyperparathyroidism of renal origin: Secondary | ICD-10-CM | POA: Diagnosis not present

## 2017-12-02 DIAGNOSIS — Z940 Kidney transplant status: Secondary | ICD-10-CM | POA: Diagnosis not present

## 2017-12-02 DIAGNOSIS — Z4822 Encounter for aftercare following kidney transplant: Secondary | ICD-10-CM | POA: Diagnosis not present

## 2017-12-03 DIAGNOSIS — D509 Iron deficiency anemia, unspecified: Secondary | ICD-10-CM | POA: Diagnosis not present

## 2017-12-03 DIAGNOSIS — N2581 Secondary hyperparathyroidism of renal origin: Secondary | ICD-10-CM | POA: Diagnosis not present

## 2017-12-03 DIAGNOSIS — N186 End stage renal disease: Secondary | ICD-10-CM | POA: Diagnosis not present

## 2017-12-06 DIAGNOSIS — D509 Iron deficiency anemia, unspecified: Secondary | ICD-10-CM | POA: Diagnosis not present

## 2017-12-06 DIAGNOSIS — N186 End stage renal disease: Secondary | ICD-10-CM | POA: Diagnosis not present

## 2017-12-06 DIAGNOSIS — N2581 Secondary hyperparathyroidism of renal origin: Secondary | ICD-10-CM | POA: Diagnosis not present

## 2017-12-07 DIAGNOSIS — D509 Iron deficiency anemia, unspecified: Secondary | ICD-10-CM | POA: Diagnosis not present

## 2017-12-07 DIAGNOSIS — E0590 Thyrotoxicosis, unspecified without thyrotoxic crisis or storm: Secondary | ICD-10-CM | POA: Diagnosis not present

## 2017-12-07 DIAGNOSIS — Z940 Kidney transplant status: Secondary | ICD-10-CM | POA: Diagnosis not present

## 2017-12-07 DIAGNOSIS — E1139 Type 2 diabetes mellitus with other diabetic ophthalmic complication: Secondary | ICD-10-CM | POA: Diagnosis not present

## 2017-12-07 DIAGNOSIS — E042 Nontoxic multinodular goiter: Secondary | ICD-10-CM | POA: Diagnosis not present

## 2017-12-07 DIAGNOSIS — E7849 Other hyperlipidemia: Secondary | ICD-10-CM | POA: Diagnosis not present

## 2017-12-07 DIAGNOSIS — Z6825 Body mass index (BMI) 25.0-25.9, adult: Secondary | ICD-10-CM | POA: Diagnosis not present

## 2017-12-07 DIAGNOSIS — E1129 Type 2 diabetes mellitus with other diabetic kidney complication: Secondary | ICD-10-CM | POA: Diagnosis not present

## 2017-12-07 DIAGNOSIS — N2581 Secondary hyperparathyroidism of renal origin: Secondary | ICD-10-CM | POA: Diagnosis not present

## 2017-12-07 DIAGNOSIS — Z992 Dependence on renal dialysis: Secondary | ICD-10-CM | POA: Diagnosis not present

## 2017-12-07 DIAGNOSIS — I10 Essential (primary) hypertension: Secondary | ICD-10-CM | POA: Diagnosis not present

## 2017-12-07 DIAGNOSIS — N186 End stage renal disease: Secondary | ICD-10-CM | POA: Diagnosis not present

## 2017-12-09 DIAGNOSIS — Z6825 Body mass index (BMI) 25.0-25.9, adult: Secondary | ICD-10-CM | POA: Diagnosis not present

## 2017-12-09 DIAGNOSIS — Z01419 Encounter for gynecological examination (general) (routine) without abnormal findings: Secondary | ICD-10-CM | POA: Diagnosis not present

## 2017-12-09 DIAGNOSIS — Z1231 Encounter for screening mammogram for malignant neoplasm of breast: Secondary | ICD-10-CM | POA: Diagnosis not present

## 2017-12-10 DIAGNOSIS — D509 Iron deficiency anemia, unspecified: Secondary | ICD-10-CM | POA: Diagnosis not present

## 2017-12-10 DIAGNOSIS — N2581 Secondary hyperparathyroidism of renal origin: Secondary | ICD-10-CM | POA: Diagnosis not present

## 2017-12-10 DIAGNOSIS — N186 End stage renal disease: Secondary | ICD-10-CM | POA: Diagnosis not present

## 2017-12-13 DIAGNOSIS — D509 Iron deficiency anemia, unspecified: Secondary | ICD-10-CM | POA: Diagnosis not present

## 2017-12-13 DIAGNOSIS — N186 End stage renal disease: Secondary | ICD-10-CM | POA: Diagnosis not present

## 2017-12-13 DIAGNOSIS — N2581 Secondary hyperparathyroidism of renal origin: Secondary | ICD-10-CM | POA: Diagnosis not present

## 2017-12-15 DIAGNOSIS — N186 End stage renal disease: Secondary | ICD-10-CM | POA: Diagnosis not present

## 2017-12-15 DIAGNOSIS — N2581 Secondary hyperparathyroidism of renal origin: Secondary | ICD-10-CM | POA: Diagnosis not present

## 2017-12-15 DIAGNOSIS — D509 Iron deficiency anemia, unspecified: Secondary | ICD-10-CM | POA: Diagnosis not present

## 2017-12-17 DIAGNOSIS — N2581 Secondary hyperparathyroidism of renal origin: Secondary | ICD-10-CM | POA: Diagnosis not present

## 2017-12-17 DIAGNOSIS — D509 Iron deficiency anemia, unspecified: Secondary | ICD-10-CM | POA: Diagnosis not present

## 2017-12-17 DIAGNOSIS — N186 End stage renal disease: Secondary | ICD-10-CM | POA: Diagnosis not present

## 2017-12-20 DIAGNOSIS — D509 Iron deficiency anemia, unspecified: Secondary | ICD-10-CM | POA: Diagnosis not present

## 2017-12-20 DIAGNOSIS — N186 End stage renal disease: Secondary | ICD-10-CM | POA: Diagnosis not present

## 2017-12-20 DIAGNOSIS — N2581 Secondary hyperparathyroidism of renal origin: Secondary | ICD-10-CM | POA: Diagnosis not present

## 2017-12-21 DIAGNOSIS — Z4931 Encounter for adequacy testing for hemodialysis: Secondary | ICD-10-CM | POA: Diagnosis not present

## 2017-12-21 DIAGNOSIS — E1129 Type 2 diabetes mellitus with other diabetic kidney complication: Secondary | ICD-10-CM | POA: Diagnosis not present

## 2017-12-21 DIAGNOSIS — D509 Iron deficiency anemia, unspecified: Secondary | ICD-10-CM | POA: Diagnosis not present

## 2017-12-21 DIAGNOSIS — D631 Anemia in chronic kidney disease: Secondary | ICD-10-CM | POA: Diagnosis not present

## 2017-12-21 DIAGNOSIS — N2581 Secondary hyperparathyroidism of renal origin: Secondary | ICD-10-CM | POA: Diagnosis not present

## 2017-12-21 DIAGNOSIS — N186 End stage renal disease: Secondary | ICD-10-CM | POA: Diagnosis not present

## 2017-12-22 DIAGNOSIS — N186 End stage renal disease: Secondary | ICD-10-CM | POA: Diagnosis not present

## 2017-12-22 DIAGNOSIS — N2581 Secondary hyperparathyroidism of renal origin: Secondary | ICD-10-CM | POA: Diagnosis not present

## 2017-12-22 DIAGNOSIS — Z4931 Encounter for adequacy testing for hemodialysis: Secondary | ICD-10-CM | POA: Diagnosis not present

## 2017-12-22 DIAGNOSIS — D509 Iron deficiency anemia, unspecified: Secondary | ICD-10-CM | POA: Diagnosis not present

## 2017-12-22 DIAGNOSIS — D631 Anemia in chronic kidney disease: Secondary | ICD-10-CM | POA: Diagnosis not present

## 2017-12-23 DIAGNOSIS — H53141 Visual discomfort, right eye: Secondary | ICD-10-CM | POA: Diagnosis not present

## 2017-12-23 DIAGNOSIS — H11431 Conjunctival hyperemia, right eye: Secondary | ICD-10-CM | POA: Diagnosis not present

## 2017-12-23 DIAGNOSIS — H1131 Conjunctival hemorrhage, right eye: Secondary | ICD-10-CM | POA: Diagnosis not present

## 2017-12-23 DIAGNOSIS — E113293 Type 2 diabetes mellitus with mild nonproliferative diabetic retinopathy without macular edema, bilateral: Secondary | ICD-10-CM | POA: Diagnosis not present

## 2017-12-24 DIAGNOSIS — D509 Iron deficiency anemia, unspecified: Secondary | ICD-10-CM | POA: Diagnosis not present

## 2017-12-24 DIAGNOSIS — Z4931 Encounter for adequacy testing for hemodialysis: Secondary | ICD-10-CM | POA: Diagnosis not present

## 2017-12-24 DIAGNOSIS — N2581 Secondary hyperparathyroidism of renal origin: Secondary | ICD-10-CM | POA: Diagnosis not present

## 2017-12-24 DIAGNOSIS — D631 Anemia in chronic kidney disease: Secondary | ICD-10-CM | POA: Diagnosis not present

## 2017-12-24 DIAGNOSIS — N186 End stage renal disease: Secondary | ICD-10-CM | POA: Diagnosis not present

## 2017-12-25 DIAGNOSIS — D631 Anemia in chronic kidney disease: Secondary | ICD-10-CM | POA: Diagnosis not present

## 2017-12-25 DIAGNOSIS — N186 End stage renal disease: Secondary | ICD-10-CM | POA: Diagnosis not present

## 2017-12-25 DIAGNOSIS — D509 Iron deficiency anemia, unspecified: Secondary | ICD-10-CM | POA: Diagnosis not present

## 2017-12-25 DIAGNOSIS — N2581 Secondary hyperparathyroidism of renal origin: Secondary | ICD-10-CM | POA: Diagnosis not present

## 2017-12-25 DIAGNOSIS — Z4931 Encounter for adequacy testing for hemodialysis: Secondary | ICD-10-CM | POA: Diagnosis not present

## 2017-12-27 DIAGNOSIS — N186 End stage renal disease: Secondary | ICD-10-CM | POA: Diagnosis not present

## 2017-12-27 DIAGNOSIS — D509 Iron deficiency anemia, unspecified: Secondary | ICD-10-CM | POA: Diagnosis not present

## 2017-12-27 DIAGNOSIS — N2581 Secondary hyperparathyroidism of renal origin: Secondary | ICD-10-CM | POA: Diagnosis not present

## 2017-12-27 DIAGNOSIS — D631 Anemia in chronic kidney disease: Secondary | ICD-10-CM | POA: Diagnosis not present

## 2017-12-27 DIAGNOSIS — Z4931 Encounter for adequacy testing for hemodialysis: Secondary | ICD-10-CM | POA: Diagnosis not present

## 2017-12-29 DIAGNOSIS — Z4931 Encounter for adequacy testing for hemodialysis: Secondary | ICD-10-CM | POA: Diagnosis not present

## 2017-12-29 DIAGNOSIS — N186 End stage renal disease: Secondary | ICD-10-CM | POA: Diagnosis not present

## 2017-12-29 DIAGNOSIS — N2581 Secondary hyperparathyroidism of renal origin: Secondary | ICD-10-CM | POA: Diagnosis not present

## 2017-12-29 DIAGNOSIS — D509 Iron deficiency anemia, unspecified: Secondary | ICD-10-CM | POA: Diagnosis not present

## 2017-12-29 DIAGNOSIS — D631 Anemia in chronic kidney disease: Secondary | ICD-10-CM | POA: Diagnosis not present

## 2017-12-30 DIAGNOSIS — Z01812 Encounter for preprocedural laboratory examination: Secondary | ICD-10-CM | POA: Diagnosis not present

## 2017-12-30 DIAGNOSIS — Z940 Kidney transplant status: Secondary | ICD-10-CM | POA: Diagnosis not present

## 2017-12-30 DIAGNOSIS — D899 Disorder involving the immune mechanism, unspecified: Secondary | ICD-10-CM | POA: Diagnosis not present

## 2017-12-31 DIAGNOSIS — D509 Iron deficiency anemia, unspecified: Secondary | ICD-10-CM | POA: Diagnosis not present

## 2017-12-31 DIAGNOSIS — N2581 Secondary hyperparathyroidism of renal origin: Secondary | ICD-10-CM | POA: Diagnosis not present

## 2017-12-31 DIAGNOSIS — Z4931 Encounter for adequacy testing for hemodialysis: Secondary | ICD-10-CM | POA: Diagnosis not present

## 2017-12-31 DIAGNOSIS — D631 Anemia in chronic kidney disease: Secondary | ICD-10-CM | POA: Diagnosis not present

## 2017-12-31 DIAGNOSIS — N186 End stage renal disease: Secondary | ICD-10-CM | POA: Diagnosis not present

## 2018-01-03 DIAGNOSIS — D509 Iron deficiency anemia, unspecified: Secondary | ICD-10-CM | POA: Diagnosis not present

## 2018-01-03 DIAGNOSIS — N2581 Secondary hyperparathyroidism of renal origin: Secondary | ICD-10-CM | POA: Diagnosis not present

## 2018-01-03 DIAGNOSIS — N186 End stage renal disease: Secondary | ICD-10-CM | POA: Diagnosis not present

## 2018-01-03 DIAGNOSIS — D631 Anemia in chronic kidney disease: Secondary | ICD-10-CM | POA: Diagnosis not present

## 2018-01-03 DIAGNOSIS — Z4931 Encounter for adequacy testing for hemodialysis: Secondary | ICD-10-CM | POA: Diagnosis not present

## 2018-01-05 DIAGNOSIS — D509 Iron deficiency anemia, unspecified: Secondary | ICD-10-CM | POA: Diagnosis not present

## 2018-01-05 DIAGNOSIS — D631 Anemia in chronic kidney disease: Secondary | ICD-10-CM | POA: Diagnosis not present

## 2018-01-05 DIAGNOSIS — N2581 Secondary hyperparathyroidism of renal origin: Secondary | ICD-10-CM | POA: Diagnosis not present

## 2018-01-05 DIAGNOSIS — Z4931 Encounter for adequacy testing for hemodialysis: Secondary | ICD-10-CM | POA: Diagnosis not present

## 2018-01-05 DIAGNOSIS — N186 End stage renal disease: Secondary | ICD-10-CM | POA: Diagnosis not present

## 2018-01-07 DIAGNOSIS — Z4931 Encounter for adequacy testing for hemodialysis: Secondary | ICD-10-CM | POA: Diagnosis not present

## 2018-01-07 DIAGNOSIS — N186 End stage renal disease: Secondary | ICD-10-CM | POA: Diagnosis not present

## 2018-01-07 DIAGNOSIS — D631 Anemia in chronic kidney disease: Secondary | ICD-10-CM | POA: Diagnosis not present

## 2018-01-07 DIAGNOSIS — D509 Iron deficiency anemia, unspecified: Secondary | ICD-10-CM | POA: Diagnosis not present

## 2018-01-07 DIAGNOSIS — N2581 Secondary hyperparathyroidism of renal origin: Secondary | ICD-10-CM | POA: Diagnosis not present

## 2018-01-10 DIAGNOSIS — N2581 Secondary hyperparathyroidism of renal origin: Secondary | ICD-10-CM | POA: Diagnosis not present

## 2018-01-10 DIAGNOSIS — Z4931 Encounter for adequacy testing for hemodialysis: Secondary | ICD-10-CM | POA: Diagnosis not present

## 2018-01-10 DIAGNOSIS — D631 Anemia in chronic kidney disease: Secondary | ICD-10-CM | POA: Diagnosis not present

## 2018-01-10 DIAGNOSIS — N186 End stage renal disease: Secondary | ICD-10-CM | POA: Diagnosis not present

## 2018-01-10 DIAGNOSIS — D509 Iron deficiency anemia, unspecified: Secondary | ICD-10-CM | POA: Diagnosis not present

## 2018-01-12 DIAGNOSIS — N2581 Secondary hyperparathyroidism of renal origin: Secondary | ICD-10-CM | POA: Diagnosis not present

## 2018-01-12 DIAGNOSIS — Z4931 Encounter for adequacy testing for hemodialysis: Secondary | ICD-10-CM | POA: Diagnosis not present

## 2018-01-12 DIAGNOSIS — N186 End stage renal disease: Secondary | ICD-10-CM | POA: Diagnosis not present

## 2018-01-12 DIAGNOSIS — D509 Iron deficiency anemia, unspecified: Secondary | ICD-10-CM | POA: Diagnosis not present

## 2018-01-12 DIAGNOSIS — D631 Anemia in chronic kidney disease: Secondary | ICD-10-CM | POA: Diagnosis not present

## 2018-01-14 DIAGNOSIS — Z4931 Encounter for adequacy testing for hemodialysis: Secondary | ICD-10-CM | POA: Diagnosis not present

## 2018-01-14 DIAGNOSIS — N2581 Secondary hyperparathyroidism of renal origin: Secondary | ICD-10-CM | POA: Diagnosis not present

## 2018-01-14 DIAGNOSIS — D631 Anemia in chronic kidney disease: Secondary | ICD-10-CM | POA: Diagnosis not present

## 2018-01-14 DIAGNOSIS — D509 Iron deficiency anemia, unspecified: Secondary | ICD-10-CM | POA: Diagnosis not present

## 2018-01-14 DIAGNOSIS — N186 End stage renal disease: Secondary | ICD-10-CM | POA: Diagnosis not present

## 2018-01-17 DIAGNOSIS — N186 End stage renal disease: Secondary | ICD-10-CM | POA: Diagnosis not present

## 2018-01-17 DIAGNOSIS — Z4931 Encounter for adequacy testing for hemodialysis: Secondary | ICD-10-CM | POA: Diagnosis not present

## 2018-01-17 DIAGNOSIS — N2581 Secondary hyperparathyroidism of renal origin: Secondary | ICD-10-CM | POA: Diagnosis not present

## 2018-01-17 DIAGNOSIS — D509 Iron deficiency anemia, unspecified: Secondary | ICD-10-CM | POA: Diagnosis not present

## 2018-01-17 DIAGNOSIS — D631 Anemia in chronic kidney disease: Secondary | ICD-10-CM | POA: Diagnosis not present

## 2018-01-19 DIAGNOSIS — Z4931 Encounter for adequacy testing for hemodialysis: Secondary | ICD-10-CM | POA: Diagnosis not present

## 2018-01-19 DIAGNOSIS — N2581 Secondary hyperparathyroidism of renal origin: Secondary | ICD-10-CM | POA: Diagnosis not present

## 2018-01-19 DIAGNOSIS — D509 Iron deficiency anemia, unspecified: Secondary | ICD-10-CM | POA: Diagnosis not present

## 2018-01-19 DIAGNOSIS — D631 Anemia in chronic kidney disease: Secondary | ICD-10-CM | POA: Diagnosis not present

## 2018-01-19 DIAGNOSIS — N186 End stage renal disease: Secondary | ICD-10-CM | POA: Diagnosis not present

## 2018-01-20 ENCOUNTER — Other Ambulatory Visit: Payer: Self-pay

## 2018-01-20 DIAGNOSIS — T8610 Unspecified complication of kidney transplant: Secondary | ICD-10-CM | POA: Diagnosis not present

## 2018-01-20 DIAGNOSIS — N186 End stage renal disease: Secondary | ICD-10-CM | POA: Diagnosis not present

## 2018-01-20 DIAGNOSIS — N184 Chronic kidney disease, stage 4 (severe): Secondary | ICD-10-CM

## 2018-01-20 DIAGNOSIS — Z992 Dependence on renal dialysis: Secondary | ICD-10-CM | POA: Diagnosis not present

## 2018-01-21 DIAGNOSIS — D509 Iron deficiency anemia, unspecified: Secondary | ICD-10-CM | POA: Diagnosis not present

## 2018-01-21 DIAGNOSIS — N2581 Secondary hyperparathyroidism of renal origin: Secondary | ICD-10-CM | POA: Diagnosis not present

## 2018-01-21 DIAGNOSIS — T8610 Unspecified complication of kidney transplant: Secondary | ICD-10-CM | POA: Diagnosis not present

## 2018-01-21 DIAGNOSIS — N186 End stage renal disease: Secondary | ICD-10-CM | POA: Diagnosis not present

## 2018-01-21 DIAGNOSIS — Z992 Dependence on renal dialysis: Secondary | ICD-10-CM | POA: Diagnosis not present

## 2018-01-24 DIAGNOSIS — N2581 Secondary hyperparathyroidism of renal origin: Secondary | ICD-10-CM | POA: Diagnosis not present

## 2018-01-24 DIAGNOSIS — N186 End stage renal disease: Secondary | ICD-10-CM | POA: Diagnosis not present

## 2018-01-24 DIAGNOSIS — D509 Iron deficiency anemia, unspecified: Secondary | ICD-10-CM | POA: Diagnosis not present

## 2018-01-25 ENCOUNTER — Ambulatory Visit (HOSPITAL_COMMUNITY)
Admission: RE | Admit: 2018-01-25 | Discharge: 2018-01-25 | Disposition: A | Discharge status: Home / Self Care | Payer: Medicare Other | Source: Ambulatory Visit | Attending: Surgery | Admitting: Surgery

## 2018-01-25 ENCOUNTER — Other Ambulatory Visit: Payer: Self-pay

## 2018-01-25 ENCOUNTER — Ambulatory Visit (INDEPENDENT_AMBULATORY_CARE_PROVIDER_SITE_OTHER): Payer: Medicare Other | Admitting: Surgery

## 2018-01-25 ENCOUNTER — Encounter: Payer: Self-pay | Admitting: Surgery

## 2018-01-25 VITALS — BP 152/79 | HR 70 | Temp 97.6°F | Resp 16 | Ht 66.0 in | Wt 161.0 lb

## 2018-01-25 DIAGNOSIS — I770 Arteriovenous fistula, acquired: Secondary | ICD-10-CM | POA: Insufficient documentation

## 2018-01-25 DIAGNOSIS — N184 Chronic kidney disease, stage 4 (severe): Secondary | ICD-10-CM

## 2018-01-25 DIAGNOSIS — Z992 Dependence on renal dialysis: Secondary | ICD-10-CM

## 2018-01-25 DIAGNOSIS — N2581 Secondary hyperparathyroidism of renal origin: Secondary | ICD-10-CM | POA: Diagnosis not present

## 2018-01-25 DIAGNOSIS — D509 Iron deficiency anemia, unspecified: Secondary | ICD-10-CM | POA: Diagnosis not present

## 2018-01-25 DIAGNOSIS — M109 Gout, unspecified: Secondary | ICD-10-CM | POA: Diagnosis not present

## 2018-01-25 DIAGNOSIS — N186 End stage renal disease: Secondary | ICD-10-CM | POA: Diagnosis not present

## 2018-01-25 NOTE — Progress Notes (Signed)
Vascular and [facility]in Specialist of [place]  Patient name: Kimberly [name] MRN: [id number] DOB: [birth year] Sex: female   REASON FOR VISIT:    ESRD  HISOTRY OF PRESENT ILLNESS:    Kimberly [name] is a 62 y.o. female who is status post left upper arm fistula on 08-11-2017 by Dr. Scot Dock.  She does home dialysis.  She has noticed that the proximal portion of the fistula has become shorter and is concerned.  She is not having difficulty with cannulation.  She is not having trouble with bleeding.   PAST MEDICAL HISTORY:   Past Medical History:  Diagnosis Date  . Adenomatous colon polyp 08/2007  . Anemia   . Diabetes mellitus type 2, controlled ([place])    type 2  . Dialysis patient ([place]) 01/2017   M-W-F  . End stage renal disease ([place])     diaylis sunday tuesdays and thurs does at home  . Goiter   . Gout   . Hyperlipidemia   . Hypertension   . Hyperthyroidism   . Kidney disease   . Kidney transplant failure    8 years ago  . Osteopenia   . Pneumonia    2018  . Seasonal allergies   . Vitamin B12 deficiency anemia due to intrinsic factor deficiency      FAMILY HISTORY:   Family History  Problem Relation Age of Onset  . Colon cancer Sister   . Colon cancer Brother   . Heart disease Mother   . Cancer - Other Father        gum to throat    SOCIAL HISTORY:   Social History   Tobacco Use  . Smoking status: Former Smoker    Packs/day: 1.00    Years: 20.00    Pack years: 20.00    Types: Cigarettes    Last attempt to quit: 07/09/1997    Years since quitting: 20.5  . Smokeless tobacco: Never Used  Substance Use Topics  . Alcohol use: No    Frequency: Never     ALLERGIES:   No Known Allergies   CURRENT MEDICATIONS:   Current Outpatient Medications  Medication Sig Dispense Refill  . ACCU-CHEK SMARTVIEW test strip 1 each by Other route 2 (two) times daily. Use twice daily as directed  0  . acetaminophen (TYLENOL) 500 MG  tablet Take 500-1,000 mg by mouth every 8 (eight) hours as needed for mild pain or headache (depends on pain if takes 1-2 tablets).    Marland Kitchen amLODipine (NORVASC) 10 MG tablet Take 10 mg by mouth every evening.     . B-D ULTRAFINE III SHORT PEN 31G X 8 MM MISC Use twice daily as directed  0  . docusate sodium (COLACE) 100 MG capsule Take 100 mg by mouth every other day.    . ezetimibe-simvastatin (VYTORIN) 10-20 MG per tablet Take 1 tablet by mouth at bedtime.    . ferric citrate (AURYXIA) 1 GM 210 MG(Fe) tablet Take 420 mg by mouth 3 (three) times daily with meals.    Marland Kitchen glipiZIDE (GLUCOTROL) 10 MG tablet Take 10 mg by mouth daily.     Marland Kitchen labetalol (NORMODYNE) 200 MG tablet Take 1 tablet (200 mg total) by mouth 2 (two) times daily. (Patient taking differently: Take 200-300 mg by mouth 2 (two) times daily. Takes 1.5 tablet in the morning and 1 tablet in the evening) 60 tablet 0  . omeprazole (PRILOSEC) 40 MG capsule Take 40 mg by mouth daily.  0  .  predniSONE (DELTASONE) 5 MG tablet Take 5 mg by mouth daily.    . insulin glargine (LANTUS) 100 UNIT/ML injection Inject 0.15 mLs (15 Units total) into the skin at bedtime. (Patient taking differently: Inject 10 Units into the skin at bedtime. ) 4.5 mL 0   No current facility-administered medications for this visit.     REVIEW OF SYSTEMS:   [X]  denotes positive finding, [ ]  denotes negative finding Cardiac  Comments:  Chest pain or chest pressure:    Shortness of breath upon exertion:    Short of breath when lying flat:    Irregular heart rhythm:        Vascular    Pain in calf, thigh, or hip brought on by ambulation:    Pain in feet at night that wakes you up from your sleep:     Blood clot in your veins:    Leg swelling:         Pulmonary    Oxygen at home:    Productive cough:     Wheezing:         Neurologic    Sudden weakness in arms or legs:     Sudden numbness in arms or legs:     Sudden onset of difficulty speaking or slurred speech:     Temporary loss of vision in one eye:     Problems with dizziness:         Gastrointestinal    Blood in stool:     Vomited blood:         Genitourinary    Burning when urinating:     Blood in urine:        Psychiatric    Major depression:         Hematologic    Bleeding problems:    Problems with blood clotting too easily:        Skin    Rashes or ulcers:        Constitutional    Fever or chills:      PHYSICAL EXAM:   Vitals:   01/25/18 1344 01/25/18 1347  BP: (!) 151/78 (!) 152/79  Pulse: 70 70  Resp: 16   Temp: 97.6 F (36.4 C)   TempSrc: Oral   SpO2: 99%   Weight: 161 lb (73 kg)   Height: 5\' 6"  (1.676 m)     GENERAL: The patient is a well-nourished female, in no acute distress. The vital signs are documented above. CARDIAC: There is a regular rate and rhythm.  VASCULAR: Aneurysmal left brachiocephalic fistula PULMONARY: Non-labored respirations MUSCULOSKELETAL: There are no major deformities or cyanosis. NEUROLOGIC: No focal weakness or paresthesias are detected. SKIN: There are no ulcers or rashes noted. PSYCHIATRIC: The patient has a normal affect.  STUDIES:   Duplex ultrasound was negative for stenosis.  MEDICAL ISSUES:   ESRD: I do not see any abnormality around the area of concern.  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  She states that it has not changed significantly in a long time.  It does not bother her.  I told her that she could undergo plication if she desires, but she is not interested at this time.  She will follow-up on an as-needed basis    Annamarie Major, MD [facility] and [facility]in Specialists of [facility] [phone] Pager [phone]

## 2018-01-26 DIAGNOSIS — N2581 Secondary hyperparathyroidism of renal origin: Secondary | ICD-10-CM | POA: Diagnosis not present

## 2018-01-26 DIAGNOSIS — N186 End stage renal disease: Secondary | ICD-10-CM | POA: Diagnosis not present

## 2018-01-26 DIAGNOSIS — D509 Iron deficiency anemia, unspecified: Secondary | ICD-10-CM | POA: Diagnosis not present

## 2018-01-28 DIAGNOSIS — N186 End stage renal disease: Secondary | ICD-10-CM | POA: Diagnosis not present

## 2018-01-28 DIAGNOSIS — D509 Iron deficiency anemia, unspecified: Secondary | ICD-10-CM | POA: Diagnosis not present

## 2018-01-28 DIAGNOSIS — N2581 Secondary hyperparathyroidism of renal origin: Secondary | ICD-10-CM | POA: Diagnosis not present

## 2018-01-31 DIAGNOSIS — D509 Iron deficiency anemia, unspecified: Secondary | ICD-10-CM | POA: Diagnosis not present

## 2018-01-31 DIAGNOSIS — N2581 Secondary hyperparathyroidism of renal origin: Secondary | ICD-10-CM | POA: Diagnosis not present

## 2018-01-31 DIAGNOSIS — N186 End stage renal disease: Secondary | ICD-10-CM | POA: Diagnosis not present

## 2018-02-02 DIAGNOSIS — D509 Iron deficiency anemia, unspecified: Secondary | ICD-10-CM | POA: Diagnosis not present

## 2018-02-02 DIAGNOSIS — N2581 Secondary hyperparathyroidism of renal origin: Secondary | ICD-10-CM | POA: Diagnosis not present

## 2018-02-02 DIAGNOSIS — N186 End stage renal disease: Secondary | ICD-10-CM | POA: Diagnosis not present

## 2018-02-04 DIAGNOSIS — D509 Iron deficiency anemia, unspecified: Secondary | ICD-10-CM | POA: Diagnosis not present

## 2018-02-04 DIAGNOSIS — N186 End stage renal disease: Secondary | ICD-10-CM | POA: Diagnosis not present

## 2018-02-04 DIAGNOSIS — N2581 Secondary hyperparathyroidism of renal origin: Secondary | ICD-10-CM | POA: Diagnosis not present

## 2018-02-07 DIAGNOSIS — N186 End stage renal disease: Secondary | ICD-10-CM | POA: Diagnosis not present

## 2018-02-07 DIAGNOSIS — N2581 Secondary hyperparathyroidism of renal origin: Secondary | ICD-10-CM | POA: Diagnosis not present

## 2018-02-07 DIAGNOSIS — D509 Iron deficiency anemia, unspecified: Secondary | ICD-10-CM | POA: Diagnosis not present

## 2018-02-09 DIAGNOSIS — D509 Iron deficiency anemia, unspecified: Secondary | ICD-10-CM | POA: Diagnosis not present

## 2018-02-09 DIAGNOSIS — N2581 Secondary hyperparathyroidism of renal origin: Secondary | ICD-10-CM | POA: Diagnosis not present

## 2018-02-09 DIAGNOSIS — N186 End stage renal disease: Secondary | ICD-10-CM | POA: Diagnosis not present

## 2018-02-11 DIAGNOSIS — N186 End stage renal disease: Secondary | ICD-10-CM | POA: Diagnosis not present

## 2018-02-11 DIAGNOSIS — N2581 Secondary hyperparathyroidism of renal origin: Secondary | ICD-10-CM | POA: Diagnosis not present

## 2018-02-11 DIAGNOSIS — D509 Iron deficiency anemia, unspecified: Secondary | ICD-10-CM | POA: Diagnosis not present

## 2018-02-14 DIAGNOSIS — D509 Iron deficiency anemia, unspecified: Secondary | ICD-10-CM | POA: Diagnosis not present

## 2018-02-14 DIAGNOSIS — N186 End stage renal disease: Secondary | ICD-10-CM | POA: Diagnosis not present

## 2018-02-14 DIAGNOSIS — N2581 Secondary hyperparathyroidism of renal origin: Secondary | ICD-10-CM | POA: Diagnosis not present

## 2018-02-16 DIAGNOSIS — N186 End stage renal disease: Secondary | ICD-10-CM | POA: Diagnosis not present

## 2018-02-16 DIAGNOSIS — N2581 Secondary hyperparathyroidism of renal origin: Secondary | ICD-10-CM | POA: Diagnosis not present

## 2018-02-16 DIAGNOSIS — D509 Iron deficiency anemia, unspecified: Secondary | ICD-10-CM | POA: Diagnosis not present

## 2018-02-18 DIAGNOSIS — N2581 Secondary hyperparathyroidism of renal origin: Secondary | ICD-10-CM | POA: Diagnosis not present

## 2018-02-18 DIAGNOSIS — N186 End stage renal disease: Secondary | ICD-10-CM | POA: Diagnosis not present

## 2018-02-18 DIAGNOSIS — D509 Iron deficiency anemia, unspecified: Secondary | ICD-10-CM | POA: Diagnosis not present

## 2018-02-21 DIAGNOSIS — Z4931 Encounter for adequacy testing for hemodialysis: Secondary | ICD-10-CM | POA: Diagnosis not present

## 2018-02-21 DIAGNOSIS — Z23 Encounter for immunization: Secondary | ICD-10-CM | POA: Diagnosis not present

## 2018-02-21 DIAGNOSIS — D509 Iron deficiency anemia, unspecified: Secondary | ICD-10-CM | POA: Diagnosis not present

## 2018-02-21 DIAGNOSIS — N186 End stage renal disease: Secondary | ICD-10-CM | POA: Diagnosis not present

## 2018-02-21 DIAGNOSIS — N2581 Secondary hyperparathyroidism of renal origin: Secondary | ICD-10-CM | POA: Diagnosis not present

## 2018-02-23 DIAGNOSIS — N2581 Secondary hyperparathyroidism of renal origin: Secondary | ICD-10-CM | POA: Diagnosis not present

## 2018-02-23 DIAGNOSIS — D509 Iron deficiency anemia, unspecified: Secondary | ICD-10-CM | POA: Diagnosis not present

## 2018-02-23 DIAGNOSIS — Z4931 Encounter for adequacy testing for hemodialysis: Secondary | ICD-10-CM | POA: Diagnosis not present

## 2018-02-23 DIAGNOSIS — Z23 Encounter for immunization: Secondary | ICD-10-CM | POA: Diagnosis not present

## 2018-02-23 DIAGNOSIS — N186 End stage renal disease: Secondary | ICD-10-CM | POA: Diagnosis not present

## 2018-02-25 DIAGNOSIS — Z23 Encounter for immunization: Secondary | ICD-10-CM | POA: Diagnosis not present

## 2018-02-25 DIAGNOSIS — N186 End stage renal disease: Secondary | ICD-10-CM | POA: Diagnosis not present

## 2018-02-25 DIAGNOSIS — Z4931 Encounter for adequacy testing for hemodialysis: Secondary | ICD-10-CM | POA: Diagnosis not present

## 2018-02-25 DIAGNOSIS — D509 Iron deficiency anemia, unspecified: Secondary | ICD-10-CM | POA: Diagnosis not present

## 2018-02-25 DIAGNOSIS — N2581 Secondary hyperparathyroidism of renal origin: Secondary | ICD-10-CM | POA: Diagnosis not present

## 2018-02-28 DIAGNOSIS — D509 Iron deficiency anemia, unspecified: Secondary | ICD-10-CM | POA: Diagnosis not present

## 2018-02-28 DIAGNOSIS — Z23 Encounter for immunization: Secondary | ICD-10-CM | POA: Diagnosis not present

## 2018-02-28 DIAGNOSIS — N2581 Secondary hyperparathyroidism of renal origin: Secondary | ICD-10-CM | POA: Diagnosis not present

## 2018-02-28 DIAGNOSIS — N186 End stage renal disease: Secondary | ICD-10-CM | POA: Diagnosis not present

## 2018-02-28 DIAGNOSIS — Z4931 Encounter for adequacy testing for hemodialysis: Secondary | ICD-10-CM | POA: Diagnosis not present

## 2018-03-02 DIAGNOSIS — Z23 Encounter for immunization: Secondary | ICD-10-CM | POA: Diagnosis not present

## 2018-03-02 DIAGNOSIS — D509 Iron deficiency anemia, unspecified: Secondary | ICD-10-CM | POA: Diagnosis not present

## 2018-03-02 DIAGNOSIS — N186 End stage renal disease: Secondary | ICD-10-CM | POA: Diagnosis not present

## 2018-03-02 DIAGNOSIS — Z4931 Encounter for adequacy testing for hemodialysis: Secondary | ICD-10-CM | POA: Diagnosis not present

## 2018-03-02 DIAGNOSIS — N2581 Secondary hyperparathyroidism of renal origin: Secondary | ICD-10-CM | POA: Diagnosis not present

## 2018-03-04 DIAGNOSIS — N186 End stage renal disease: Secondary | ICD-10-CM | POA: Diagnosis not present

## 2018-03-04 DIAGNOSIS — Z4931 Encounter for adequacy testing for hemodialysis: Secondary | ICD-10-CM | POA: Diagnosis not present

## 2018-03-04 DIAGNOSIS — D509 Iron deficiency anemia, unspecified: Secondary | ICD-10-CM | POA: Diagnosis not present

## 2018-03-04 DIAGNOSIS — Z23 Encounter for immunization: Secondary | ICD-10-CM | POA: Diagnosis not present

## 2018-03-04 DIAGNOSIS — N2581 Secondary hyperparathyroidism of renal origin: Secondary | ICD-10-CM | POA: Diagnosis not present

## 2018-03-07 DIAGNOSIS — N2581 Secondary hyperparathyroidism of renal origin: Secondary | ICD-10-CM | POA: Diagnosis not present

## 2018-03-07 DIAGNOSIS — N186 End stage renal disease: Secondary | ICD-10-CM | POA: Diagnosis not present

## 2018-03-07 DIAGNOSIS — Z4931 Encounter for adequacy testing for hemodialysis: Secondary | ICD-10-CM | POA: Diagnosis not present

## 2018-03-07 DIAGNOSIS — Z23 Encounter for immunization: Secondary | ICD-10-CM | POA: Diagnosis not present

## 2018-03-07 DIAGNOSIS — D509 Iron deficiency anemia, unspecified: Secondary | ICD-10-CM | POA: Diagnosis not present

## 2018-03-09 DIAGNOSIS — Z4931 Encounter for adequacy testing for hemodialysis: Secondary | ICD-10-CM | POA: Diagnosis not present

## 2018-03-09 DIAGNOSIS — N2581 Secondary hyperparathyroidism of renal origin: Secondary | ICD-10-CM | POA: Diagnosis not present

## 2018-03-09 DIAGNOSIS — Z23 Encounter for immunization: Secondary | ICD-10-CM | POA: Diagnosis not present

## 2018-03-09 DIAGNOSIS — N186 End stage renal disease: Secondary | ICD-10-CM | POA: Diagnosis not present

## 2018-03-09 DIAGNOSIS — D509 Iron deficiency anemia, unspecified: Secondary | ICD-10-CM | POA: Diagnosis not present

## 2018-03-11 DIAGNOSIS — N2581 Secondary hyperparathyroidism of renal origin: Secondary | ICD-10-CM | POA: Diagnosis not present

## 2018-03-11 DIAGNOSIS — Z4931 Encounter for adequacy testing for hemodialysis: Secondary | ICD-10-CM | POA: Diagnosis not present

## 2018-03-11 DIAGNOSIS — N186 End stage renal disease: Secondary | ICD-10-CM | POA: Diagnosis not present

## 2018-03-11 DIAGNOSIS — Z23 Encounter for immunization: Secondary | ICD-10-CM | POA: Diagnosis not present

## 2018-03-11 DIAGNOSIS — D509 Iron deficiency anemia, unspecified: Secondary | ICD-10-CM | POA: Diagnosis not present

## 2018-03-14 DIAGNOSIS — N2581 Secondary hyperparathyroidism of renal origin: Secondary | ICD-10-CM | POA: Diagnosis not present

## 2018-03-14 DIAGNOSIS — Z4931 Encounter for adequacy testing for hemodialysis: Secondary | ICD-10-CM | POA: Diagnosis not present

## 2018-03-14 DIAGNOSIS — Z23 Encounter for immunization: Secondary | ICD-10-CM | POA: Diagnosis not present

## 2018-03-14 DIAGNOSIS — N186 End stage renal disease: Secondary | ICD-10-CM | POA: Diagnosis not present

## 2018-03-14 DIAGNOSIS — D509 Iron deficiency anemia, unspecified: Secondary | ICD-10-CM | POA: Diagnosis not present

## 2018-03-16 DIAGNOSIS — N186 End stage renal disease: Secondary | ICD-10-CM | POA: Diagnosis not present

## 2018-03-16 DIAGNOSIS — Z23 Encounter for immunization: Secondary | ICD-10-CM | POA: Diagnosis not present

## 2018-03-16 DIAGNOSIS — N2581 Secondary hyperparathyroidism of renal origin: Secondary | ICD-10-CM | POA: Diagnosis not present

## 2018-03-16 DIAGNOSIS — D509 Iron deficiency anemia, unspecified: Secondary | ICD-10-CM | POA: Diagnosis not present

## 2018-03-16 DIAGNOSIS — Z4931 Encounter for adequacy testing for hemodialysis: Secondary | ICD-10-CM | POA: Diagnosis not present

## 2018-03-18 DIAGNOSIS — D509 Iron deficiency anemia, unspecified: Secondary | ICD-10-CM | POA: Diagnosis not present

## 2018-03-18 DIAGNOSIS — Z23 Encounter for immunization: Secondary | ICD-10-CM | POA: Diagnosis not present

## 2018-03-18 DIAGNOSIS — Z4931 Encounter for adequacy testing for hemodialysis: Secondary | ICD-10-CM | POA: Diagnosis not present

## 2018-03-18 DIAGNOSIS — N2581 Secondary hyperparathyroidism of renal origin: Secondary | ICD-10-CM | POA: Diagnosis not present

## 2018-03-18 DIAGNOSIS — N186 End stage renal disease: Secondary | ICD-10-CM | POA: Diagnosis not present

## 2018-03-21 DIAGNOSIS — Z23 Encounter for immunization: Secondary | ICD-10-CM | POA: Diagnosis not present

## 2018-03-21 DIAGNOSIS — N2581 Secondary hyperparathyroidism of renal origin: Secondary | ICD-10-CM | POA: Diagnosis not present

## 2018-03-21 DIAGNOSIS — D509 Iron deficiency anemia, unspecified: Secondary | ICD-10-CM | POA: Diagnosis not present

## 2018-03-21 DIAGNOSIS — N186 End stage renal disease: Secondary | ICD-10-CM | POA: Diagnosis not present

## 2018-03-21 DIAGNOSIS — Z4931 Encounter for adequacy testing for hemodialysis: Secondary | ICD-10-CM | POA: Diagnosis not present

## 2018-03-22 DIAGNOSIS — N186 End stage renal disease: Secondary | ICD-10-CM | POA: Diagnosis not present

## 2018-03-22 DIAGNOSIS — T8610 Unspecified complication of kidney transplant: Secondary | ICD-10-CM | POA: Diagnosis not present

## 2018-03-22 DIAGNOSIS — Z992 Dependence on renal dialysis: Secondary | ICD-10-CM | POA: Diagnosis not present

## 2018-03-23 DIAGNOSIS — E1129 Type 2 diabetes mellitus with other diabetic kidney complication: Secondary | ICD-10-CM | POA: Diagnosis not present

## 2018-03-23 DIAGNOSIS — N186 End stage renal disease: Secondary | ICD-10-CM | POA: Diagnosis not present

## 2018-03-23 DIAGNOSIS — M109 Gout, unspecified: Secondary | ICD-10-CM | POA: Diagnosis not present

## 2018-03-23 DIAGNOSIS — N2581 Secondary hyperparathyroidism of renal origin: Secondary | ICD-10-CM | POA: Diagnosis not present

## 2018-03-23 DIAGNOSIS — T8610 Unspecified complication of kidney transplant: Secondary | ICD-10-CM | POA: Diagnosis not present

## 2018-03-23 DIAGNOSIS — Z992 Dependence on renal dialysis: Secondary | ICD-10-CM | POA: Diagnosis not present

## 2018-03-25 DIAGNOSIS — N186 End stage renal disease: Secondary | ICD-10-CM | POA: Diagnosis not present

## 2018-03-25 DIAGNOSIS — N2581 Secondary hyperparathyroidism of renal origin: Secondary | ICD-10-CM | POA: Diagnosis not present

## 2018-03-28 DIAGNOSIS — N186 End stage renal disease: Secondary | ICD-10-CM | POA: Diagnosis not present

## 2018-03-28 DIAGNOSIS — N2581 Secondary hyperparathyroidism of renal origin: Secondary | ICD-10-CM | POA: Diagnosis not present

## 2018-03-30 DIAGNOSIS — N186 End stage renal disease: Secondary | ICD-10-CM | POA: Diagnosis not present

## 2018-03-30 DIAGNOSIS — N2581 Secondary hyperparathyroidism of renal origin: Secondary | ICD-10-CM | POA: Diagnosis not present

## 2018-04-01 DIAGNOSIS — N186 End stage renal disease: Secondary | ICD-10-CM | POA: Diagnosis not present

## 2018-04-01 DIAGNOSIS — N2581 Secondary hyperparathyroidism of renal origin: Secondary | ICD-10-CM | POA: Diagnosis not present

## 2018-04-04 DIAGNOSIS — N186 End stage renal disease: Secondary | ICD-10-CM | POA: Diagnosis not present

## 2018-04-04 DIAGNOSIS — N2581 Secondary hyperparathyroidism of renal origin: Secondary | ICD-10-CM | POA: Diagnosis not present

## 2018-04-06 DIAGNOSIS — N2581 Secondary hyperparathyroidism of renal origin: Secondary | ICD-10-CM | POA: Diagnosis not present

## 2018-04-06 DIAGNOSIS — N186 End stage renal disease: Secondary | ICD-10-CM | POA: Diagnosis not present

## 2018-04-08 DIAGNOSIS — N2581 Secondary hyperparathyroidism of renal origin: Secondary | ICD-10-CM | POA: Diagnosis not present

## 2018-04-08 DIAGNOSIS — N186 End stage renal disease: Secondary | ICD-10-CM | POA: Diagnosis not present

## 2018-04-11 DIAGNOSIS — N186 End stage renal disease: Secondary | ICD-10-CM | POA: Diagnosis not present

## 2018-04-11 DIAGNOSIS — N2581 Secondary hyperparathyroidism of renal origin: Secondary | ICD-10-CM | POA: Diagnosis not present

## 2018-04-13 DIAGNOSIS — N186 End stage renal disease: Secondary | ICD-10-CM | POA: Diagnosis not present

## 2018-04-13 DIAGNOSIS — N2581 Secondary hyperparathyroidism of renal origin: Secondary | ICD-10-CM | POA: Diagnosis not present

## 2018-04-15 DIAGNOSIS — N2581 Secondary hyperparathyroidism of renal origin: Secondary | ICD-10-CM | POA: Diagnosis not present

## 2018-04-15 DIAGNOSIS — N186 End stage renal disease: Secondary | ICD-10-CM | POA: Diagnosis not present

## 2018-04-18 DIAGNOSIS — N186 End stage renal disease: Secondary | ICD-10-CM | POA: Diagnosis not present

## 2018-04-18 DIAGNOSIS — N2581 Secondary hyperparathyroidism of renal origin: Secondary | ICD-10-CM | POA: Diagnosis not present

## 2018-04-20 DIAGNOSIS — N2581 Secondary hyperparathyroidism of renal origin: Secondary | ICD-10-CM | POA: Diagnosis not present

## 2018-04-20 DIAGNOSIS — N186 End stage renal disease: Secondary | ICD-10-CM | POA: Diagnosis not present

## 2018-04-22 DIAGNOSIS — N186 End stage renal disease: Secondary | ICD-10-CM | POA: Diagnosis not present

## 2018-04-22 DIAGNOSIS — N2581 Secondary hyperparathyroidism of renal origin: Secondary | ICD-10-CM | POA: Diagnosis not present

## 2018-04-23 DIAGNOSIS — Z992 Dependence on renal dialysis: Secondary | ICD-10-CM | POA: Diagnosis not present

## 2018-04-23 DIAGNOSIS — T8610 Unspecified complication of kidney transplant: Secondary | ICD-10-CM | POA: Diagnosis not present

## 2018-04-23 DIAGNOSIS — N186 End stage renal disease: Secondary | ICD-10-CM | POA: Diagnosis not present

## 2018-04-25 DIAGNOSIS — N2581 Secondary hyperparathyroidism of renal origin: Secondary | ICD-10-CM | POA: Diagnosis not present

## 2018-04-25 DIAGNOSIS — N186 End stage renal disease: Secondary | ICD-10-CM | POA: Diagnosis not present

## 2018-04-26 DIAGNOSIS — M109 Gout, unspecified: Secondary | ICD-10-CM | POA: Diagnosis not present

## 2018-04-26 DIAGNOSIS — N2581 Secondary hyperparathyroidism of renal origin: Secondary | ICD-10-CM | POA: Diagnosis not present

## 2018-04-26 DIAGNOSIS — N186 End stage renal disease: Secondary | ICD-10-CM | POA: Diagnosis not present

## 2018-04-27 DIAGNOSIS — N2581 Secondary hyperparathyroidism of renal origin: Secondary | ICD-10-CM | POA: Diagnosis not present

## 2018-04-27 DIAGNOSIS — N186 End stage renal disease: Secondary | ICD-10-CM | POA: Diagnosis not present

## 2018-04-30 DIAGNOSIS — N2581 Secondary hyperparathyroidism of renal origin: Secondary | ICD-10-CM | POA: Diagnosis not present

## 2018-04-30 DIAGNOSIS — N186 End stage renal disease: Secondary | ICD-10-CM | POA: Diagnosis not present

## 2018-05-02 DIAGNOSIS — N186 End stage renal disease: Secondary | ICD-10-CM | POA: Diagnosis not present

## 2018-05-02 DIAGNOSIS — N2581 Secondary hyperparathyroidism of renal origin: Secondary | ICD-10-CM | POA: Diagnosis not present

## 2018-05-04 DIAGNOSIS — N2581 Secondary hyperparathyroidism of renal origin: Secondary | ICD-10-CM | POA: Diagnosis not present

## 2018-05-04 DIAGNOSIS — N186 End stage renal disease: Secondary | ICD-10-CM | POA: Diagnosis not present

## 2018-05-06 DIAGNOSIS — N2581 Secondary hyperparathyroidism of renal origin: Secondary | ICD-10-CM | POA: Diagnosis not present

## 2018-05-06 DIAGNOSIS — N186 End stage renal disease: Secondary | ICD-10-CM | POA: Diagnosis not present

## 2018-05-09 DIAGNOSIS — N186 End stage renal disease: Secondary | ICD-10-CM | POA: Diagnosis not present

## 2018-05-09 DIAGNOSIS — N2581 Secondary hyperparathyroidism of renal origin: Secondary | ICD-10-CM | POA: Diagnosis not present

## 2018-05-11 DIAGNOSIS — N186 End stage renal disease: Secondary | ICD-10-CM | POA: Diagnosis not present

## 2018-05-11 DIAGNOSIS — N2581 Secondary hyperparathyroidism of renal origin: Secondary | ICD-10-CM | POA: Diagnosis not present

## 2018-05-13 DIAGNOSIS — N186 End stage renal disease: Secondary | ICD-10-CM | POA: Diagnosis not present

## 2018-05-13 DIAGNOSIS — N2581 Secondary hyperparathyroidism of renal origin: Secondary | ICD-10-CM | POA: Diagnosis not present

## 2018-05-16 DIAGNOSIS — N2581 Secondary hyperparathyroidism of renal origin: Secondary | ICD-10-CM | POA: Diagnosis not present

## 2018-05-16 DIAGNOSIS — N186 End stage renal disease: Secondary | ICD-10-CM | POA: Diagnosis not present

## 2018-05-18 DIAGNOSIS — N186 End stage renal disease: Secondary | ICD-10-CM | POA: Diagnosis not present

## 2018-05-18 DIAGNOSIS — N2581 Secondary hyperparathyroidism of renal origin: Secondary | ICD-10-CM | POA: Diagnosis not present

## 2018-05-20 DIAGNOSIS — N2581 Secondary hyperparathyroidism of renal origin: Secondary | ICD-10-CM | POA: Diagnosis not present

## 2018-05-20 DIAGNOSIS — N186 End stage renal disease: Secondary | ICD-10-CM | POA: Diagnosis not present

## 2018-05-23 DIAGNOSIS — T8610 Unspecified complication of kidney transplant: Secondary | ICD-10-CM | POA: Diagnosis not present

## 2018-05-23 DIAGNOSIS — Z992 Dependence on renal dialysis: Secondary | ICD-10-CM | POA: Diagnosis not present

## 2018-05-23 DIAGNOSIS — N186 End stage renal disease: Secondary | ICD-10-CM | POA: Diagnosis not present

## 2018-05-23 DIAGNOSIS — N2581 Secondary hyperparathyroidism of renal origin: Secondary | ICD-10-CM | POA: Diagnosis not present

## 2018-05-25 DIAGNOSIS — N2581 Secondary hyperparathyroidism of renal origin: Secondary | ICD-10-CM | POA: Diagnosis not present

## 2018-05-25 DIAGNOSIS — N186 End stage renal disease: Secondary | ICD-10-CM | POA: Diagnosis not present

## 2018-05-25 DIAGNOSIS — M109 Gout, unspecified: Secondary | ICD-10-CM | POA: Diagnosis not present

## 2018-05-27 DIAGNOSIS — N2581 Secondary hyperparathyroidism of renal origin: Secondary | ICD-10-CM | POA: Diagnosis not present

## 2018-05-27 DIAGNOSIS — N186 End stage renal disease: Secondary | ICD-10-CM | POA: Diagnosis not present

## 2018-05-30 DIAGNOSIS — N2581 Secondary hyperparathyroidism of renal origin: Secondary | ICD-10-CM | POA: Diagnosis not present

## 2018-05-30 DIAGNOSIS — N186 End stage renal disease: Secondary | ICD-10-CM | POA: Diagnosis not present

## 2018-06-01 DIAGNOSIS — N2581 Secondary hyperparathyroidism of renal origin: Secondary | ICD-10-CM | POA: Diagnosis not present

## 2018-06-01 DIAGNOSIS — N186 End stage renal disease: Secondary | ICD-10-CM | POA: Diagnosis not present

## 2018-06-03 DIAGNOSIS — N2581 Secondary hyperparathyroidism of renal origin: Secondary | ICD-10-CM | POA: Diagnosis not present

## 2018-06-03 DIAGNOSIS — N186 End stage renal disease: Secondary | ICD-10-CM | POA: Diagnosis not present

## 2018-06-06 DIAGNOSIS — N2581 Secondary hyperparathyroidism of renal origin: Secondary | ICD-10-CM | POA: Diagnosis not present

## 2018-06-06 DIAGNOSIS — N186 End stage renal disease: Secondary | ICD-10-CM | POA: Diagnosis not present

## 2018-06-08 DIAGNOSIS — N186 End stage renal disease: Secondary | ICD-10-CM | POA: Diagnosis not present

## 2018-06-08 DIAGNOSIS — N2581 Secondary hyperparathyroidism of renal origin: Secondary | ICD-10-CM | POA: Diagnosis not present

## 2018-06-10 DIAGNOSIS — N2581 Secondary hyperparathyroidism of renal origin: Secondary | ICD-10-CM | POA: Diagnosis not present

## 2018-06-10 DIAGNOSIS — N186 End stage renal disease: Secondary | ICD-10-CM | POA: Diagnosis not present

## 2018-06-13 DIAGNOSIS — N186 End stage renal disease: Secondary | ICD-10-CM | POA: Diagnosis not present

## 2018-06-13 DIAGNOSIS — N2581 Secondary hyperparathyroidism of renal origin: Secondary | ICD-10-CM | POA: Diagnosis not present

## 2018-06-15 DIAGNOSIS — N186 End stage renal disease: Secondary | ICD-10-CM | POA: Diagnosis not present

## 2018-06-15 DIAGNOSIS — N2581 Secondary hyperparathyroidism of renal origin: Secondary | ICD-10-CM | POA: Diagnosis not present

## 2018-06-17 DIAGNOSIS — N2581 Secondary hyperparathyroidism of renal origin: Secondary | ICD-10-CM | POA: Diagnosis not present

## 2018-06-17 DIAGNOSIS — N186 End stage renal disease: Secondary | ICD-10-CM | POA: Diagnosis not present

## 2018-06-20 DIAGNOSIS — N2581 Secondary hyperparathyroidism of renal origin: Secondary | ICD-10-CM | POA: Diagnosis not present

## 2018-06-20 DIAGNOSIS — N186 End stage renal disease: Secondary | ICD-10-CM | POA: Diagnosis not present

## 2018-06-21 DIAGNOSIS — E559 Vitamin D deficiency, unspecified: Secondary | ICD-10-CM | POA: Diagnosis not present

## 2018-06-21 DIAGNOSIS — E7849 Other hyperlipidemia: Secondary | ICD-10-CM | POA: Diagnosis not present

## 2018-06-21 DIAGNOSIS — E1129 Type 2 diabetes mellitus with other diabetic kidney complication: Secondary | ICD-10-CM | POA: Diagnosis not present

## 2018-06-21 DIAGNOSIS — M109 Gout, unspecified: Secondary | ICD-10-CM | POA: Diagnosis not present

## 2018-06-21 DIAGNOSIS — E0590 Thyrotoxicosis, unspecified without thyrotoxic crisis or storm: Secondary | ICD-10-CM | POA: Diagnosis not present

## 2018-06-21 DIAGNOSIS — N186 End stage renal disease: Secondary | ICD-10-CM | POA: Diagnosis not present

## 2018-06-22 DIAGNOSIS — N2581 Secondary hyperparathyroidism of renal origin: Secondary | ICD-10-CM | POA: Diagnosis not present

## 2018-06-22 DIAGNOSIS — E1129 Type 2 diabetes mellitus with other diabetic kidney complication: Secondary | ICD-10-CM | POA: Diagnosis not present

## 2018-06-22 DIAGNOSIS — N186 End stage renal disease: Secondary | ICD-10-CM | POA: Diagnosis not present

## 2018-06-23 DIAGNOSIS — N2581 Secondary hyperparathyroidism of renal origin: Secondary | ICD-10-CM | POA: Diagnosis not present

## 2018-06-23 DIAGNOSIS — N186 End stage renal disease: Secondary | ICD-10-CM | POA: Diagnosis not present

## 2018-06-25 DIAGNOSIS — N186 End stage renal disease: Secondary | ICD-10-CM | POA: Diagnosis not present

## 2018-06-25 DIAGNOSIS — N2581 Secondary hyperparathyroidism of renal origin: Secondary | ICD-10-CM | POA: Diagnosis not present

## 2018-06-27 DIAGNOSIS — N2581 Secondary hyperparathyroidism of renal origin: Secondary | ICD-10-CM | POA: Diagnosis not present

## 2018-06-27 DIAGNOSIS — N186 End stage renal disease: Secondary | ICD-10-CM | POA: Diagnosis not present

## 2018-06-28 DIAGNOSIS — Z1389 Encounter for screening for other disorder: Secondary | ICD-10-CM | POA: Diagnosis not present

## 2018-06-28 DIAGNOSIS — Z0000 Encounter for general adult medical examination without abnormal findings: Secondary | ICD-10-CM | POA: Diagnosis not present

## 2018-06-28 DIAGNOSIS — I10 Essential (primary) hypertension: Secondary | ICD-10-CM | POA: Diagnosis not present

## 2018-06-28 DIAGNOSIS — Z1331 Encounter for screening for depression: Secondary | ICD-10-CM | POA: Diagnosis not present

## 2018-06-28 DIAGNOSIS — N186 End stage renal disease: Secondary | ICD-10-CM | POA: Diagnosis not present

## 2018-06-28 DIAGNOSIS — Z992 Dependence on renal dialysis: Secondary | ICD-10-CM | POA: Diagnosis not present

## 2018-06-28 DIAGNOSIS — E1139 Type 2 diabetes mellitus with other diabetic ophthalmic complication: Secondary | ICD-10-CM | POA: Diagnosis not present

## 2018-06-28 DIAGNOSIS — E0590 Thyrotoxicosis, unspecified without thyrotoxic crisis or storm: Secondary | ICD-10-CM | POA: Diagnosis not present

## 2018-06-28 DIAGNOSIS — E7849 Other hyperlipidemia: Secondary | ICD-10-CM | POA: Diagnosis not present

## 2018-06-28 DIAGNOSIS — E1129 Type 2 diabetes mellitus with other diabetic kidney complication: Secondary | ICD-10-CM | POA: Diagnosis not present

## 2018-06-28 DIAGNOSIS — E042 Nontoxic multinodular goiter: Secondary | ICD-10-CM | POA: Diagnosis not present

## 2018-06-28 DIAGNOSIS — Z6827 Body mass index (BMI) 27.0-27.9, adult: Secondary | ICD-10-CM | POA: Diagnosis not present

## 2018-06-28 DIAGNOSIS — Z940 Kidney transplant status: Secondary | ICD-10-CM | POA: Diagnosis not present

## 2018-06-29 DIAGNOSIS — M109 Gout, unspecified: Secondary | ICD-10-CM | POA: Diagnosis not present

## 2018-06-29 DIAGNOSIS — N2581 Secondary hyperparathyroidism of renal origin: Secondary | ICD-10-CM | POA: Diagnosis not present

## 2018-06-29 DIAGNOSIS — E1129 Type 2 diabetes mellitus with other diabetic kidney complication: Secondary | ICD-10-CM | POA: Diagnosis not present

## 2018-06-29 DIAGNOSIS — N186 End stage renal disease: Secondary | ICD-10-CM | POA: Diagnosis not present

## 2018-07-01 DIAGNOSIS — N2581 Secondary hyperparathyroidism of renal origin: Secondary | ICD-10-CM | POA: Diagnosis not present

## 2018-07-01 DIAGNOSIS — N186 End stage renal disease: Secondary | ICD-10-CM | POA: Diagnosis not present

## 2018-07-03 DIAGNOSIS — N186 End stage renal disease: Secondary | ICD-10-CM | POA: Diagnosis not present

## 2018-07-03 DIAGNOSIS — N2581 Secondary hyperparathyroidism of renal origin: Secondary | ICD-10-CM | POA: Diagnosis not present

## 2018-07-06 DIAGNOSIS — N2581 Secondary hyperparathyroidism of renal origin: Secondary | ICD-10-CM | POA: Diagnosis not present

## 2018-07-06 DIAGNOSIS — N186 End stage renal disease: Secondary | ICD-10-CM | POA: Diagnosis not present

## 2018-07-08 DIAGNOSIS — N2581 Secondary hyperparathyroidism of renal origin: Secondary | ICD-10-CM | POA: Diagnosis not present

## 2018-07-08 DIAGNOSIS — N186 End stage renal disease: Secondary | ICD-10-CM | POA: Diagnosis not present

## 2018-07-10 DIAGNOSIS — N186 End stage renal disease: Secondary | ICD-10-CM | POA: Diagnosis not present

## 2018-07-10 DIAGNOSIS — N2581 Secondary hyperparathyroidism of renal origin: Secondary | ICD-10-CM | POA: Diagnosis not present

## 2018-07-13 DIAGNOSIS — N186 End stage renal disease: Secondary | ICD-10-CM | POA: Diagnosis not present

## 2018-07-13 DIAGNOSIS — N2581 Secondary hyperparathyroidism of renal origin: Secondary | ICD-10-CM | POA: Diagnosis not present

## 2018-07-15 DIAGNOSIS — N2581 Secondary hyperparathyroidism of renal origin: Secondary | ICD-10-CM | POA: Diagnosis not present

## 2018-07-15 DIAGNOSIS — N186 End stage renal disease: Secondary | ICD-10-CM | POA: Diagnosis not present

## 2018-07-18 DIAGNOSIS — N186 End stage renal disease: Secondary | ICD-10-CM | POA: Diagnosis not present

## 2018-07-18 DIAGNOSIS — N2581 Secondary hyperparathyroidism of renal origin: Secondary | ICD-10-CM | POA: Diagnosis not present

## 2018-07-20 DIAGNOSIS — N2581 Secondary hyperparathyroidism of renal origin: Secondary | ICD-10-CM | POA: Diagnosis not present

## 2018-07-20 DIAGNOSIS — N186 End stage renal disease: Secondary | ICD-10-CM | POA: Diagnosis not present

## 2018-07-22 DIAGNOSIS — N2581 Secondary hyperparathyroidism of renal origin: Secondary | ICD-10-CM | POA: Diagnosis not present

## 2018-07-22 DIAGNOSIS — N186 End stage renal disease: Secondary | ICD-10-CM | POA: Diagnosis not present

## 2018-07-23 DIAGNOSIS — N186 End stage renal disease: Secondary | ICD-10-CM | POA: Diagnosis not present

## 2018-07-23 DIAGNOSIS — Z992 Dependence on renal dialysis: Secondary | ICD-10-CM | POA: Diagnosis not present

## 2018-07-23 DIAGNOSIS — T8610 Unspecified complication of kidney transplant: Secondary | ICD-10-CM | POA: Diagnosis not present

## 2018-07-24 DIAGNOSIS — Z992 Dependence on renal dialysis: Secondary | ICD-10-CM | POA: Diagnosis not present

## 2018-07-24 DIAGNOSIS — N186 End stage renal disease: Secondary | ICD-10-CM | POA: Diagnosis not present

## 2018-07-24 DIAGNOSIS — T8610 Unspecified complication of kidney transplant: Secondary | ICD-10-CM | POA: Diagnosis not present

## 2018-07-25 DIAGNOSIS — N186 End stage renal disease: Secondary | ICD-10-CM | POA: Diagnosis not present

## 2018-07-25 DIAGNOSIS — N2581 Secondary hyperparathyroidism of renal origin: Secondary | ICD-10-CM | POA: Diagnosis not present

## 2018-07-26 DIAGNOSIS — Z794 Long term (current) use of insulin: Secondary | ICD-10-CM | POA: Insufficient documentation

## 2018-07-26 DIAGNOSIS — R252 Cramp and spasm: Secondary | ICD-10-CM | POA: Insufficient documentation

## 2018-07-26 DIAGNOSIS — N186 End stage renal disease: Secondary | ICD-10-CM | POA: Diagnosis not present

## 2018-07-26 DIAGNOSIS — M109 Gout, unspecified: Secondary | ICD-10-CM | POA: Diagnosis not present

## 2018-07-26 DIAGNOSIS — N2581 Secondary hyperparathyroidism of renal origin: Secondary | ICD-10-CM | POA: Diagnosis not present

## 2018-07-26 DIAGNOSIS — Z992 Dependence on renal dialysis: Secondary | ICD-10-CM | POA: Insufficient documentation

## 2018-07-26 DIAGNOSIS — R519 Headache, unspecified: Secondary | ICD-10-CM | POA: Insufficient documentation

## 2018-07-26 DIAGNOSIS — I120 Hypertensive chronic kidney disease with stage 5 chronic kidney disease or end stage renal disease: Secondary | ICD-10-CM | POA: Insufficient documentation

## 2018-07-28 DIAGNOSIS — E870 Hyperosmolality and hypernatremia: Secondary | ICD-10-CM | POA: Diagnosis not present

## 2018-07-28 DIAGNOSIS — N186 End stage renal disease: Secondary | ICD-10-CM | POA: Diagnosis not present

## 2018-07-28 DIAGNOSIS — N2581 Secondary hyperparathyroidism of renal origin: Secondary | ICD-10-CM | POA: Diagnosis not present

## 2018-07-28 DIAGNOSIS — Z4931 Encounter for adequacy testing for hemodialysis: Secondary | ICD-10-CM | POA: Diagnosis not present

## 2018-07-28 DIAGNOSIS — E876 Hypokalemia: Secondary | ICD-10-CM | POA: Diagnosis not present

## 2018-07-30 DIAGNOSIS — E876 Hypokalemia: Secondary | ICD-10-CM | POA: Diagnosis not present

## 2018-07-30 DIAGNOSIS — N186 End stage renal disease: Secondary | ICD-10-CM | POA: Diagnosis not present

## 2018-07-30 DIAGNOSIS — Z4931 Encounter for adequacy testing for hemodialysis: Secondary | ICD-10-CM | POA: Diagnosis not present

## 2018-07-30 DIAGNOSIS — E870 Hyperosmolality and hypernatremia: Secondary | ICD-10-CM | POA: Diagnosis not present

## 2018-07-30 DIAGNOSIS — N2581 Secondary hyperparathyroidism of renal origin: Secondary | ICD-10-CM | POA: Diagnosis not present

## 2018-08-02 DIAGNOSIS — Z4931 Encounter for adequacy testing for hemodialysis: Secondary | ICD-10-CM | POA: Diagnosis not present

## 2018-08-02 DIAGNOSIS — N2581 Secondary hyperparathyroidism of renal origin: Secondary | ICD-10-CM | POA: Diagnosis not present

## 2018-08-02 DIAGNOSIS — N186 End stage renal disease: Secondary | ICD-10-CM | POA: Diagnosis not present

## 2018-08-02 DIAGNOSIS — E876 Hypokalemia: Secondary | ICD-10-CM | POA: Diagnosis not present

## 2018-08-02 DIAGNOSIS — E870 Hyperosmolality and hypernatremia: Secondary | ICD-10-CM | POA: Diagnosis not present

## 2018-08-04 DIAGNOSIS — E876 Hypokalemia: Secondary | ICD-10-CM | POA: Diagnosis not present

## 2018-08-04 DIAGNOSIS — N2581 Secondary hyperparathyroidism of renal origin: Secondary | ICD-10-CM | POA: Diagnosis not present

## 2018-08-04 DIAGNOSIS — Z4931 Encounter for adequacy testing for hemodialysis: Secondary | ICD-10-CM | POA: Diagnosis not present

## 2018-08-04 DIAGNOSIS — N186 End stage renal disease: Secondary | ICD-10-CM | POA: Diagnosis not present

## 2018-08-04 DIAGNOSIS — E870 Hyperosmolality and hypernatremia: Secondary | ICD-10-CM | POA: Diagnosis not present

## 2018-08-05 DIAGNOSIS — E870 Hyperosmolality and hypernatremia: Secondary | ICD-10-CM | POA: Diagnosis not present

## 2018-08-05 DIAGNOSIS — Z4931 Encounter for adequacy testing for hemodialysis: Secondary | ICD-10-CM | POA: Diagnosis not present

## 2018-08-05 DIAGNOSIS — N2581 Secondary hyperparathyroidism of renal origin: Secondary | ICD-10-CM | POA: Diagnosis not present

## 2018-08-05 DIAGNOSIS — E876 Hypokalemia: Secondary | ICD-10-CM | POA: Diagnosis not present

## 2018-08-05 DIAGNOSIS — N186 End stage renal disease: Secondary | ICD-10-CM | POA: Diagnosis not present

## 2018-08-09 DIAGNOSIS — N2581 Secondary hyperparathyroidism of renal origin: Secondary | ICD-10-CM | POA: Diagnosis not present

## 2018-08-09 DIAGNOSIS — Z4931 Encounter for adequacy testing for hemodialysis: Secondary | ICD-10-CM | POA: Diagnosis not present

## 2018-08-09 DIAGNOSIS — E876 Hypokalemia: Secondary | ICD-10-CM | POA: Diagnosis not present

## 2018-08-09 DIAGNOSIS — E870 Hyperosmolality and hypernatremia: Secondary | ICD-10-CM | POA: Diagnosis not present

## 2018-08-09 DIAGNOSIS — N186 End stage renal disease: Secondary | ICD-10-CM | POA: Diagnosis not present

## 2018-08-11 DIAGNOSIS — E870 Hyperosmolality and hypernatremia: Secondary | ICD-10-CM | POA: Insufficient documentation

## 2018-08-11 DIAGNOSIS — Z4931 Encounter for adequacy testing for hemodialysis: Secondary | ICD-10-CM | POA: Diagnosis not present

## 2018-08-11 DIAGNOSIS — N186 End stage renal disease: Secondary | ICD-10-CM | POA: Diagnosis not present

## 2018-08-11 DIAGNOSIS — E876 Hypokalemia: Secondary | ICD-10-CM | POA: Diagnosis not present

## 2018-08-11 DIAGNOSIS — N2581 Secondary hyperparathyroidism of renal origin: Secondary | ICD-10-CM | POA: Diagnosis not present

## 2018-08-12 DIAGNOSIS — N186 End stage renal disease: Secondary | ICD-10-CM | POA: Diagnosis not present

## 2018-08-12 DIAGNOSIS — E870 Hyperosmolality and hypernatremia: Secondary | ICD-10-CM | POA: Diagnosis not present

## 2018-08-12 DIAGNOSIS — E876 Hypokalemia: Secondary | ICD-10-CM | POA: Insufficient documentation

## 2018-08-12 DIAGNOSIS — Z4931 Encounter for adequacy testing for hemodialysis: Secondary | ICD-10-CM | POA: Diagnosis not present

## 2018-08-12 DIAGNOSIS — N2581 Secondary hyperparathyroidism of renal origin: Secondary | ICD-10-CM | POA: Diagnosis not present

## 2018-08-13 DIAGNOSIS — N186 End stage renal disease: Secondary | ICD-10-CM | POA: Diagnosis not present

## 2018-08-13 DIAGNOSIS — E870 Hyperosmolality and hypernatremia: Secondary | ICD-10-CM | POA: Diagnosis not present

## 2018-08-13 DIAGNOSIS — N2581 Secondary hyperparathyroidism of renal origin: Secondary | ICD-10-CM | POA: Diagnosis not present

## 2018-08-13 DIAGNOSIS — E876 Hypokalemia: Secondary | ICD-10-CM | POA: Diagnosis not present

## 2018-08-13 DIAGNOSIS — Z4931 Encounter for adequacy testing for hemodialysis: Secondary | ICD-10-CM | POA: Diagnosis not present

## 2018-08-16 DIAGNOSIS — N186 End stage renal disease: Secondary | ICD-10-CM | POA: Diagnosis not present

## 2018-08-16 DIAGNOSIS — N2581 Secondary hyperparathyroidism of renal origin: Secondary | ICD-10-CM | POA: Diagnosis not present

## 2018-08-16 DIAGNOSIS — E876 Hypokalemia: Secondary | ICD-10-CM | POA: Diagnosis not present

## 2018-08-16 DIAGNOSIS — E870 Hyperosmolality and hypernatremia: Secondary | ICD-10-CM | POA: Diagnosis not present

## 2018-08-16 DIAGNOSIS — Z4931 Encounter for adequacy testing for hemodialysis: Secondary | ICD-10-CM | POA: Diagnosis not present

## 2018-08-18 DIAGNOSIS — Z4931 Encounter for adequacy testing for hemodialysis: Secondary | ICD-10-CM | POA: Diagnosis not present

## 2018-08-18 DIAGNOSIS — E870 Hyperosmolality and hypernatremia: Secondary | ICD-10-CM | POA: Diagnosis not present

## 2018-08-18 DIAGNOSIS — E876 Hypokalemia: Secondary | ICD-10-CM | POA: Diagnosis not present

## 2018-08-18 DIAGNOSIS — N186 End stage renal disease: Secondary | ICD-10-CM | POA: Diagnosis not present

## 2018-08-18 DIAGNOSIS — N2581 Secondary hyperparathyroidism of renal origin: Secondary | ICD-10-CM | POA: Diagnosis not present

## 2018-08-19 DIAGNOSIS — N2581 Secondary hyperparathyroidism of renal origin: Secondary | ICD-10-CM | POA: Diagnosis not present

## 2018-08-19 DIAGNOSIS — Z4931 Encounter for adequacy testing for hemodialysis: Secondary | ICD-10-CM | POA: Diagnosis not present

## 2018-08-19 DIAGNOSIS — E876 Hypokalemia: Secondary | ICD-10-CM | POA: Diagnosis not present

## 2018-08-19 DIAGNOSIS — N186 End stage renal disease: Secondary | ICD-10-CM | POA: Diagnosis not present

## 2018-08-19 DIAGNOSIS — E870 Hyperosmolality and hypernatremia: Secondary | ICD-10-CM | POA: Diagnosis not present

## 2018-08-20 DIAGNOSIS — E876 Hypokalemia: Secondary | ICD-10-CM | POA: Diagnosis not present

## 2018-08-20 DIAGNOSIS — N186 End stage renal disease: Secondary | ICD-10-CM | POA: Diagnosis not present

## 2018-08-20 DIAGNOSIS — E870 Hyperosmolality and hypernatremia: Secondary | ICD-10-CM | POA: Diagnosis not present

## 2018-08-20 DIAGNOSIS — N2581 Secondary hyperparathyroidism of renal origin: Secondary | ICD-10-CM | POA: Diagnosis not present

## 2018-08-20 DIAGNOSIS — Z4931 Encounter for adequacy testing for hemodialysis: Secondary | ICD-10-CM | POA: Diagnosis not present

## 2018-08-23 DIAGNOSIS — N186 End stage renal disease: Secondary | ICD-10-CM | POA: Diagnosis not present

## 2018-08-23 DIAGNOSIS — N2581 Secondary hyperparathyroidism of renal origin: Secondary | ICD-10-CM | POA: Diagnosis not present

## 2018-08-24 DIAGNOSIS — M109 Gout, unspecified: Secondary | ICD-10-CM | POA: Diagnosis not present

## 2018-08-24 DIAGNOSIS — N2581 Secondary hyperparathyroidism of renal origin: Secondary | ICD-10-CM | POA: Diagnosis not present

## 2018-08-24 DIAGNOSIS — N186 End stage renal disease: Secondary | ICD-10-CM | POA: Diagnosis not present

## 2018-08-25 DIAGNOSIS — N2581 Secondary hyperparathyroidism of renal origin: Secondary | ICD-10-CM | POA: Diagnosis not present

## 2018-08-25 DIAGNOSIS — N186 End stage renal disease: Secondary | ICD-10-CM | POA: Diagnosis not present

## 2018-08-26 DIAGNOSIS — N186 End stage renal disease: Secondary | ICD-10-CM | POA: Diagnosis not present

## 2018-08-26 DIAGNOSIS — N2581 Secondary hyperparathyroidism of renal origin: Secondary | ICD-10-CM | POA: Diagnosis not present

## 2018-08-27 DIAGNOSIS — N2581 Secondary hyperparathyroidism of renal origin: Secondary | ICD-10-CM | POA: Diagnosis not present

## 2018-08-27 DIAGNOSIS — N186 End stage renal disease: Secondary | ICD-10-CM | POA: Diagnosis not present

## 2018-08-30 DIAGNOSIS — N2581 Secondary hyperparathyroidism of renal origin: Secondary | ICD-10-CM | POA: Diagnosis not present

## 2018-08-30 DIAGNOSIS — N186 End stage renal disease: Secondary | ICD-10-CM | POA: Diagnosis not present

## 2018-09-01 DIAGNOSIS — N186 End stage renal disease: Secondary | ICD-10-CM | POA: Diagnosis not present

## 2018-09-01 DIAGNOSIS — N2581 Secondary hyperparathyroidism of renal origin: Secondary | ICD-10-CM | POA: Diagnosis not present

## 2018-09-02 DIAGNOSIS — N2581 Secondary hyperparathyroidism of renal origin: Secondary | ICD-10-CM | POA: Diagnosis not present

## 2018-09-02 DIAGNOSIS — N186 End stage renal disease: Secondary | ICD-10-CM | POA: Diagnosis not present

## 2018-09-03 DIAGNOSIS — N186 End stage renal disease: Secondary | ICD-10-CM | POA: Diagnosis not present

## 2018-09-03 DIAGNOSIS — N2581 Secondary hyperparathyroidism of renal origin: Secondary | ICD-10-CM | POA: Diagnosis not present

## 2018-09-06 DIAGNOSIS — N186 End stage renal disease: Secondary | ICD-10-CM | POA: Diagnosis not present

## 2018-09-06 DIAGNOSIS — N2581 Secondary hyperparathyroidism of renal origin: Secondary | ICD-10-CM | POA: Diagnosis not present

## 2018-09-08 DIAGNOSIS — I120 Hypertensive chronic kidney disease with stage 5 chronic kidney disease or end stage renal disease: Secondary | ICD-10-CM | POA: Diagnosis not present

## 2018-09-08 DIAGNOSIS — Z794 Long term (current) use of insulin: Secondary | ICD-10-CM | POA: Diagnosis not present

## 2018-09-08 DIAGNOSIS — E1122 Type 2 diabetes mellitus with diabetic chronic kidney disease: Secondary | ICD-10-CM | POA: Diagnosis not present

## 2018-09-08 DIAGNOSIS — Z992 Dependence on renal dialysis: Secondary | ICD-10-CM | POA: Diagnosis not present

## 2018-09-08 DIAGNOSIS — N2581 Secondary hyperparathyroidism of renal origin: Secondary | ICD-10-CM | POA: Diagnosis not present

## 2018-09-08 DIAGNOSIS — R51 Headache: Secondary | ICD-10-CM | POA: Diagnosis not present

## 2018-09-08 DIAGNOSIS — N186 End stage renal disease: Secondary | ICD-10-CM | POA: Diagnosis not present

## 2018-09-08 DIAGNOSIS — I953 Hypotension of hemodialysis: Secondary | ICD-10-CM | POA: Diagnosis not present

## 2018-09-08 DIAGNOSIS — R252 Cramp and spasm: Secondary | ICD-10-CM | POA: Diagnosis not present

## 2018-09-09 DIAGNOSIS — I120 Hypertensive chronic kidney disease with stage 5 chronic kidney disease or end stage renal disease: Secondary | ICD-10-CM | POA: Diagnosis not present

## 2018-09-09 DIAGNOSIS — R51 Headache: Secondary | ICD-10-CM | POA: Diagnosis not present

## 2018-09-09 DIAGNOSIS — N186 End stage renal disease: Secondary | ICD-10-CM | POA: Diagnosis not present

## 2018-09-09 DIAGNOSIS — I953 Hypotension of hemodialysis: Secondary | ICD-10-CM | POA: Diagnosis not present

## 2018-09-09 DIAGNOSIS — Z992 Dependence on renal dialysis: Secondary | ICD-10-CM | POA: Diagnosis not present

## 2018-09-09 DIAGNOSIS — R252 Cramp and spasm: Secondary | ICD-10-CM | POA: Diagnosis not present

## 2018-09-10 DIAGNOSIS — R51 Headache: Secondary | ICD-10-CM | POA: Diagnosis not present

## 2018-09-10 DIAGNOSIS — I120 Hypertensive chronic kidney disease with stage 5 chronic kidney disease or end stage renal disease: Secondary | ICD-10-CM | POA: Diagnosis not present

## 2018-09-10 DIAGNOSIS — Z992 Dependence on renal dialysis: Secondary | ICD-10-CM | POA: Diagnosis not present

## 2018-09-10 DIAGNOSIS — I953 Hypotension of hemodialysis: Secondary | ICD-10-CM | POA: Diagnosis not present

## 2018-09-10 DIAGNOSIS — R252 Cramp and spasm: Secondary | ICD-10-CM | POA: Diagnosis not present

## 2018-09-10 DIAGNOSIS — N186 End stage renal disease: Secondary | ICD-10-CM | POA: Diagnosis not present

## 2018-09-13 DIAGNOSIS — I953 Hypotension of hemodialysis: Secondary | ICD-10-CM | POA: Diagnosis not present

## 2018-09-13 DIAGNOSIS — N186 End stage renal disease: Secondary | ICD-10-CM | POA: Diagnosis not present

## 2018-09-13 DIAGNOSIS — R51 Headache: Secondary | ICD-10-CM | POA: Diagnosis not present

## 2018-09-13 DIAGNOSIS — Z992 Dependence on renal dialysis: Secondary | ICD-10-CM | POA: Diagnosis not present

## 2018-09-13 DIAGNOSIS — R252 Cramp and spasm: Secondary | ICD-10-CM | POA: Diagnosis not present

## 2018-09-13 DIAGNOSIS — I120 Hypertensive chronic kidney disease with stage 5 chronic kidney disease or end stage renal disease: Secondary | ICD-10-CM | POA: Diagnosis not present

## 2018-09-15 DIAGNOSIS — R252 Cramp and spasm: Secondary | ICD-10-CM | POA: Diagnosis not present

## 2018-09-15 DIAGNOSIS — Z992 Dependence on renal dialysis: Secondary | ICD-10-CM | POA: Diagnosis not present

## 2018-09-15 DIAGNOSIS — R51 Headache: Secondary | ICD-10-CM | POA: Diagnosis not present

## 2018-09-15 DIAGNOSIS — I120 Hypertensive chronic kidney disease with stage 5 chronic kidney disease or end stage renal disease: Secondary | ICD-10-CM | POA: Diagnosis not present

## 2018-09-15 DIAGNOSIS — I953 Hypotension of hemodialysis: Secondary | ICD-10-CM | POA: Diagnosis not present

## 2018-09-15 DIAGNOSIS — N186 End stage renal disease: Secondary | ICD-10-CM | POA: Diagnosis not present

## 2018-09-16 DIAGNOSIS — R252 Cramp and spasm: Secondary | ICD-10-CM | POA: Diagnosis not present

## 2018-09-16 DIAGNOSIS — I953 Hypotension of hemodialysis: Secondary | ICD-10-CM | POA: Diagnosis not present

## 2018-09-16 DIAGNOSIS — N186 End stage renal disease: Secondary | ICD-10-CM | POA: Diagnosis not present

## 2018-09-16 DIAGNOSIS — I120 Hypertensive chronic kidney disease with stage 5 chronic kidney disease or end stage renal disease: Secondary | ICD-10-CM | POA: Diagnosis not present

## 2018-09-16 DIAGNOSIS — Z992 Dependence on renal dialysis: Secondary | ICD-10-CM | POA: Diagnosis not present

## 2018-09-16 DIAGNOSIS — R51 Headache: Secondary | ICD-10-CM | POA: Diagnosis not present

## 2018-09-17 DIAGNOSIS — I120 Hypertensive chronic kidney disease with stage 5 chronic kidney disease or end stage renal disease: Secondary | ICD-10-CM | POA: Diagnosis not present

## 2018-09-17 DIAGNOSIS — R252 Cramp and spasm: Secondary | ICD-10-CM | POA: Diagnosis not present

## 2018-09-17 DIAGNOSIS — I953 Hypotension of hemodialysis: Secondary | ICD-10-CM | POA: Diagnosis not present

## 2018-09-17 DIAGNOSIS — N186 End stage renal disease: Secondary | ICD-10-CM | POA: Diagnosis not present

## 2018-09-17 DIAGNOSIS — R51 Headache: Secondary | ICD-10-CM | POA: Diagnosis not present

## 2018-09-17 DIAGNOSIS — Z992 Dependence on renal dialysis: Secondary | ICD-10-CM | POA: Diagnosis not present

## 2018-09-20 DIAGNOSIS — Z992 Dependence on renal dialysis: Secondary | ICD-10-CM | POA: Diagnosis not present

## 2018-09-20 DIAGNOSIS — N186 End stage renal disease: Secondary | ICD-10-CM | POA: Diagnosis not present

## 2018-09-20 DIAGNOSIS — R252 Cramp and spasm: Secondary | ICD-10-CM | POA: Diagnosis not present

## 2018-09-20 DIAGNOSIS — I120 Hypertensive chronic kidney disease with stage 5 chronic kidney disease or end stage renal disease: Secondary | ICD-10-CM | POA: Diagnosis not present

## 2018-09-20 DIAGNOSIS — I953 Hypotension of hemodialysis: Secondary | ICD-10-CM | POA: Diagnosis not present

## 2018-09-20 DIAGNOSIS — R51 Headache: Secondary | ICD-10-CM | POA: Diagnosis not present

## 2018-09-21 DIAGNOSIS — N186 End stage renal disease: Secondary | ICD-10-CM | POA: Diagnosis not present

## 2018-09-21 DIAGNOSIS — Z992 Dependence on renal dialysis: Secondary | ICD-10-CM | POA: Diagnosis not present

## 2018-09-21 DIAGNOSIS — T8610 Unspecified complication of kidney transplant: Secondary | ICD-10-CM | POA: Diagnosis not present

## 2018-09-22 DIAGNOSIS — N2581 Secondary hyperparathyroidism of renal origin: Secondary | ICD-10-CM | POA: Diagnosis not present

## 2018-09-22 DIAGNOSIS — D509 Iron deficiency anemia, unspecified: Secondary | ICD-10-CM | POA: Diagnosis not present

## 2018-09-22 DIAGNOSIS — N186 End stage renal disease: Secondary | ICD-10-CM | POA: Diagnosis not present

## 2018-09-22 DIAGNOSIS — I120 Hypertensive chronic kidney disease with stage 5 chronic kidney disease or end stage renal disease: Secondary | ICD-10-CM | POA: Diagnosis not present

## 2018-09-22 DIAGNOSIS — R252 Cramp and spasm: Secondary | ICD-10-CM | POA: Diagnosis not present

## 2018-09-22 DIAGNOSIS — R51 Headache: Secondary | ICD-10-CM | POA: Diagnosis not present

## 2018-09-22 DIAGNOSIS — Z794 Long term (current) use of insulin: Secondary | ICD-10-CM | POA: Diagnosis not present

## 2018-09-22 DIAGNOSIS — I953 Hypotension of hemodialysis: Secondary | ICD-10-CM | POA: Diagnosis not present

## 2018-09-22 DIAGNOSIS — E1122 Type 2 diabetes mellitus with diabetic chronic kidney disease: Secondary | ICD-10-CM | POA: Diagnosis not present

## 2018-09-22 DIAGNOSIS — Z992 Dependence on renal dialysis: Secondary | ICD-10-CM | POA: Diagnosis not present

## 2018-09-23 DIAGNOSIS — R252 Cramp and spasm: Secondary | ICD-10-CM | POA: Diagnosis not present

## 2018-09-23 DIAGNOSIS — D509 Iron deficiency anemia, unspecified: Secondary | ICD-10-CM | POA: Diagnosis not present

## 2018-09-23 DIAGNOSIS — I120 Hypertensive chronic kidney disease with stage 5 chronic kidney disease or end stage renal disease: Secondary | ICD-10-CM | POA: Diagnosis not present

## 2018-09-23 DIAGNOSIS — N186 End stage renal disease: Secondary | ICD-10-CM | POA: Diagnosis not present

## 2018-09-23 DIAGNOSIS — I953 Hypotension of hemodialysis: Secondary | ICD-10-CM | POA: Diagnosis not present

## 2018-09-23 DIAGNOSIS — R51 Headache: Secondary | ICD-10-CM | POA: Diagnosis not present

## 2018-09-24 DIAGNOSIS — D509 Iron deficiency anemia, unspecified: Secondary | ICD-10-CM | POA: Diagnosis not present

## 2018-09-24 DIAGNOSIS — R51 Headache: Secondary | ICD-10-CM | POA: Diagnosis not present

## 2018-09-24 DIAGNOSIS — I953 Hypotension of hemodialysis: Secondary | ICD-10-CM | POA: Diagnosis not present

## 2018-09-24 DIAGNOSIS — I120 Hypertensive chronic kidney disease with stage 5 chronic kidney disease or end stage renal disease: Secondary | ICD-10-CM | POA: Diagnosis not present

## 2018-09-24 DIAGNOSIS — N186 End stage renal disease: Secondary | ICD-10-CM | POA: Diagnosis not present

## 2018-09-24 DIAGNOSIS — R252 Cramp and spasm: Secondary | ICD-10-CM | POA: Diagnosis not present

## 2018-09-27 DIAGNOSIS — D509 Iron deficiency anemia, unspecified: Secondary | ICD-10-CM | POA: Diagnosis not present

## 2018-09-27 DIAGNOSIS — I120 Hypertensive chronic kidney disease with stage 5 chronic kidney disease or end stage renal disease: Secondary | ICD-10-CM | POA: Diagnosis not present

## 2018-09-27 DIAGNOSIS — E1129 Type 2 diabetes mellitus with other diabetic kidney complication: Secondary | ICD-10-CM | POA: Diagnosis not present

## 2018-09-27 DIAGNOSIS — R51 Headache: Secondary | ICD-10-CM | POA: Diagnosis not present

## 2018-09-27 DIAGNOSIS — I953 Hypotension of hemodialysis: Secondary | ICD-10-CM | POA: Diagnosis not present

## 2018-09-27 DIAGNOSIS — R252 Cramp and spasm: Secondary | ICD-10-CM | POA: Diagnosis not present

## 2018-09-27 DIAGNOSIS — M109 Gout, unspecified: Secondary | ICD-10-CM | POA: Diagnosis not present

## 2018-09-27 DIAGNOSIS — N186 End stage renal disease: Secondary | ICD-10-CM | POA: Diagnosis not present

## 2018-09-29 DIAGNOSIS — R252 Cramp and spasm: Secondary | ICD-10-CM | POA: Diagnosis not present

## 2018-09-29 DIAGNOSIS — R51 Headache: Secondary | ICD-10-CM | POA: Diagnosis not present

## 2018-09-29 DIAGNOSIS — I953 Hypotension of hemodialysis: Secondary | ICD-10-CM | POA: Diagnosis not present

## 2018-09-29 DIAGNOSIS — N186 End stage renal disease: Secondary | ICD-10-CM | POA: Diagnosis not present

## 2018-09-29 DIAGNOSIS — D509 Iron deficiency anemia, unspecified: Secondary | ICD-10-CM | POA: Diagnosis not present

## 2018-09-29 DIAGNOSIS — I120 Hypertensive chronic kidney disease with stage 5 chronic kidney disease or end stage renal disease: Secondary | ICD-10-CM | POA: Diagnosis not present

## 2018-09-30 DIAGNOSIS — D509 Iron deficiency anemia, unspecified: Secondary | ICD-10-CM | POA: Diagnosis not present

## 2018-09-30 DIAGNOSIS — I953 Hypotension of hemodialysis: Secondary | ICD-10-CM | POA: Diagnosis not present

## 2018-09-30 DIAGNOSIS — N186 End stage renal disease: Secondary | ICD-10-CM | POA: Diagnosis not present

## 2018-09-30 DIAGNOSIS — I120 Hypertensive chronic kidney disease with stage 5 chronic kidney disease or end stage renal disease: Secondary | ICD-10-CM | POA: Diagnosis not present

## 2018-09-30 DIAGNOSIS — R51 Headache: Secondary | ICD-10-CM | POA: Diagnosis not present

## 2018-09-30 DIAGNOSIS — R252 Cramp and spasm: Secondary | ICD-10-CM | POA: Diagnosis not present

## 2018-10-01 DIAGNOSIS — N186 End stage renal disease: Secondary | ICD-10-CM | POA: Diagnosis not present

## 2018-10-01 DIAGNOSIS — I953 Hypotension of hemodialysis: Secondary | ICD-10-CM | POA: Diagnosis not present

## 2018-10-01 DIAGNOSIS — I120 Hypertensive chronic kidney disease with stage 5 chronic kidney disease or end stage renal disease: Secondary | ICD-10-CM | POA: Diagnosis not present

## 2018-10-01 DIAGNOSIS — R51 Headache: Secondary | ICD-10-CM | POA: Diagnosis not present

## 2018-10-01 DIAGNOSIS — D509 Iron deficiency anemia, unspecified: Secondary | ICD-10-CM | POA: Diagnosis not present

## 2018-10-01 DIAGNOSIS — R252 Cramp and spasm: Secondary | ICD-10-CM | POA: Diagnosis not present

## 2018-10-04 DIAGNOSIS — I120 Hypertensive chronic kidney disease with stage 5 chronic kidney disease or end stage renal disease: Secondary | ICD-10-CM | POA: Diagnosis not present

## 2018-10-04 DIAGNOSIS — I953 Hypotension of hemodialysis: Secondary | ICD-10-CM | POA: Diagnosis not present

## 2018-10-04 DIAGNOSIS — D509 Iron deficiency anemia, unspecified: Secondary | ICD-10-CM | POA: Diagnosis not present

## 2018-10-04 DIAGNOSIS — R252 Cramp and spasm: Secondary | ICD-10-CM | POA: Diagnosis not present

## 2018-10-04 DIAGNOSIS — N186 End stage renal disease: Secondary | ICD-10-CM | POA: Diagnosis not present

## 2018-10-04 DIAGNOSIS — R51 Headache: Secondary | ICD-10-CM | POA: Diagnosis not present

## 2018-10-06 DIAGNOSIS — D509 Iron deficiency anemia, unspecified: Secondary | ICD-10-CM | POA: Diagnosis not present

## 2018-10-06 DIAGNOSIS — R252 Cramp and spasm: Secondary | ICD-10-CM | POA: Diagnosis not present

## 2018-10-06 DIAGNOSIS — I120 Hypertensive chronic kidney disease with stage 5 chronic kidney disease or end stage renal disease: Secondary | ICD-10-CM | POA: Diagnosis not present

## 2018-10-06 DIAGNOSIS — R51 Headache: Secondary | ICD-10-CM | POA: Diagnosis not present

## 2018-10-06 DIAGNOSIS — I953 Hypotension of hemodialysis: Secondary | ICD-10-CM | POA: Diagnosis not present

## 2018-10-06 DIAGNOSIS — N186 End stage renal disease: Secondary | ICD-10-CM | POA: Diagnosis not present

## 2018-10-07 DIAGNOSIS — N186 End stage renal disease: Secondary | ICD-10-CM | POA: Diagnosis not present

## 2018-10-07 DIAGNOSIS — R252 Cramp and spasm: Secondary | ICD-10-CM | POA: Diagnosis not present

## 2018-10-07 DIAGNOSIS — D509 Iron deficiency anemia, unspecified: Secondary | ICD-10-CM | POA: Diagnosis not present

## 2018-10-07 DIAGNOSIS — I120 Hypertensive chronic kidney disease with stage 5 chronic kidney disease or end stage renal disease: Secondary | ICD-10-CM | POA: Diagnosis not present

## 2018-10-07 DIAGNOSIS — I953 Hypotension of hemodialysis: Secondary | ICD-10-CM | POA: Diagnosis not present

## 2018-10-07 DIAGNOSIS — R51 Headache: Secondary | ICD-10-CM | POA: Diagnosis not present

## 2018-10-08 DIAGNOSIS — I953 Hypotension of hemodialysis: Secondary | ICD-10-CM | POA: Diagnosis not present

## 2018-10-08 DIAGNOSIS — N186 End stage renal disease: Secondary | ICD-10-CM | POA: Diagnosis not present

## 2018-10-08 DIAGNOSIS — D509 Iron deficiency anemia, unspecified: Secondary | ICD-10-CM | POA: Diagnosis not present

## 2018-10-08 DIAGNOSIS — I120 Hypertensive chronic kidney disease with stage 5 chronic kidney disease or end stage renal disease: Secondary | ICD-10-CM | POA: Diagnosis not present

## 2018-10-08 DIAGNOSIS — R252 Cramp and spasm: Secondary | ICD-10-CM | POA: Diagnosis not present

## 2018-10-08 DIAGNOSIS — R51 Headache: Secondary | ICD-10-CM | POA: Diagnosis not present

## 2018-10-11 DIAGNOSIS — I120 Hypertensive chronic kidney disease with stage 5 chronic kidney disease or end stage renal disease: Secondary | ICD-10-CM | POA: Diagnosis not present

## 2018-10-11 DIAGNOSIS — R252 Cramp and spasm: Secondary | ICD-10-CM | POA: Diagnosis not present

## 2018-10-11 DIAGNOSIS — D509 Iron deficiency anemia, unspecified: Secondary | ICD-10-CM | POA: Diagnosis not present

## 2018-10-11 DIAGNOSIS — R51 Headache: Secondary | ICD-10-CM | POA: Diagnosis not present

## 2018-10-11 DIAGNOSIS — I953 Hypotension of hemodialysis: Secondary | ICD-10-CM | POA: Diagnosis not present

## 2018-10-11 DIAGNOSIS — N186 End stage renal disease: Secondary | ICD-10-CM | POA: Diagnosis not present

## 2018-10-13 DIAGNOSIS — I120 Hypertensive chronic kidney disease with stage 5 chronic kidney disease or end stage renal disease: Secondary | ICD-10-CM | POA: Diagnosis not present

## 2018-10-13 DIAGNOSIS — R252 Cramp and spasm: Secondary | ICD-10-CM | POA: Diagnosis not present

## 2018-10-13 DIAGNOSIS — R51 Headache: Secondary | ICD-10-CM | POA: Diagnosis not present

## 2018-10-13 DIAGNOSIS — N186 End stage renal disease: Secondary | ICD-10-CM | POA: Diagnosis not present

## 2018-10-13 DIAGNOSIS — D509 Iron deficiency anemia, unspecified: Secondary | ICD-10-CM | POA: Diagnosis not present

## 2018-10-13 DIAGNOSIS — I953 Hypotension of hemodialysis: Secondary | ICD-10-CM | POA: Diagnosis not present

## 2018-10-14 DIAGNOSIS — R51 Headache: Secondary | ICD-10-CM | POA: Diagnosis not present

## 2018-10-14 DIAGNOSIS — R252 Cramp and spasm: Secondary | ICD-10-CM | POA: Diagnosis not present

## 2018-10-14 DIAGNOSIS — I120 Hypertensive chronic kidney disease with stage 5 chronic kidney disease or end stage renal disease: Secondary | ICD-10-CM | POA: Diagnosis not present

## 2018-10-14 DIAGNOSIS — I953 Hypotension of hemodialysis: Secondary | ICD-10-CM | POA: Diagnosis not present

## 2018-10-14 DIAGNOSIS — D509 Iron deficiency anemia, unspecified: Secondary | ICD-10-CM | POA: Diagnosis not present

## 2018-10-14 DIAGNOSIS — N186 End stage renal disease: Secondary | ICD-10-CM | POA: Diagnosis not present

## 2018-10-15 DIAGNOSIS — R252 Cramp and spasm: Secondary | ICD-10-CM | POA: Diagnosis not present

## 2018-10-15 DIAGNOSIS — I953 Hypotension of hemodialysis: Secondary | ICD-10-CM | POA: Diagnosis not present

## 2018-10-15 DIAGNOSIS — N186 End stage renal disease: Secondary | ICD-10-CM | POA: Diagnosis not present

## 2018-10-15 DIAGNOSIS — R51 Headache: Secondary | ICD-10-CM | POA: Diagnosis not present

## 2018-10-15 DIAGNOSIS — I120 Hypertensive chronic kidney disease with stage 5 chronic kidney disease or end stage renal disease: Secondary | ICD-10-CM | POA: Diagnosis not present

## 2018-10-15 DIAGNOSIS — D509 Iron deficiency anemia, unspecified: Secondary | ICD-10-CM | POA: Diagnosis not present

## 2018-10-18 DIAGNOSIS — I953 Hypotension of hemodialysis: Secondary | ICD-10-CM | POA: Diagnosis not present

## 2018-10-18 DIAGNOSIS — I120 Hypertensive chronic kidney disease with stage 5 chronic kidney disease or end stage renal disease: Secondary | ICD-10-CM | POA: Diagnosis not present

## 2018-10-18 DIAGNOSIS — N186 End stage renal disease: Secondary | ICD-10-CM | POA: Diagnosis not present

## 2018-10-18 DIAGNOSIS — R51 Headache: Secondary | ICD-10-CM | POA: Diagnosis not present

## 2018-10-18 DIAGNOSIS — R252 Cramp and spasm: Secondary | ICD-10-CM | POA: Diagnosis not present

## 2018-10-18 DIAGNOSIS — D509 Iron deficiency anemia, unspecified: Secondary | ICD-10-CM | POA: Diagnosis not present

## 2018-10-20 DIAGNOSIS — R51 Headache: Secondary | ICD-10-CM | POA: Diagnosis not present

## 2018-10-20 DIAGNOSIS — D509 Iron deficiency anemia, unspecified: Secondary | ICD-10-CM | POA: Diagnosis not present

## 2018-10-20 DIAGNOSIS — I120 Hypertensive chronic kidney disease with stage 5 chronic kidney disease or end stage renal disease: Secondary | ICD-10-CM | POA: Diagnosis not present

## 2018-10-20 DIAGNOSIS — I953 Hypotension of hemodialysis: Secondary | ICD-10-CM | POA: Diagnosis not present

## 2018-10-20 DIAGNOSIS — R252 Cramp and spasm: Secondary | ICD-10-CM | POA: Diagnosis not present

## 2018-10-20 DIAGNOSIS — N186 End stage renal disease: Secondary | ICD-10-CM | POA: Diagnosis not present

## 2018-10-21 DIAGNOSIS — I953 Hypotension of hemodialysis: Secondary | ICD-10-CM | POA: Diagnosis not present

## 2018-10-21 DIAGNOSIS — R252 Cramp and spasm: Secondary | ICD-10-CM | POA: Diagnosis not present

## 2018-10-21 DIAGNOSIS — N186 End stage renal disease: Secondary | ICD-10-CM | POA: Diagnosis not present

## 2018-10-21 DIAGNOSIS — Z992 Dependence on renal dialysis: Secondary | ICD-10-CM | POA: Diagnosis not present

## 2018-10-21 DIAGNOSIS — I120 Hypertensive chronic kidney disease with stage 5 chronic kidney disease or end stage renal disease: Secondary | ICD-10-CM | POA: Diagnosis not present

## 2018-10-21 DIAGNOSIS — D509 Iron deficiency anemia, unspecified: Secondary | ICD-10-CM | POA: Diagnosis not present

## 2018-10-21 DIAGNOSIS — R51 Headache: Secondary | ICD-10-CM | POA: Diagnosis not present

## 2018-10-21 DIAGNOSIS — T8610 Unspecified complication of kidney transplant: Secondary | ICD-10-CM | POA: Diagnosis not present

## 2018-10-22 DIAGNOSIS — I953 Hypotension of hemodialysis: Secondary | ICD-10-CM | POA: Diagnosis not present

## 2018-10-22 DIAGNOSIS — E1122 Type 2 diabetes mellitus with diabetic chronic kidney disease: Secondary | ICD-10-CM | POA: Diagnosis not present

## 2018-10-22 DIAGNOSIS — I120 Hypertensive chronic kidney disease with stage 5 chronic kidney disease or end stage renal disease: Secondary | ICD-10-CM | POA: Diagnosis not present

## 2018-10-22 DIAGNOSIS — Z992 Dependence on renal dialysis: Secondary | ICD-10-CM | POA: Diagnosis not present

## 2018-10-22 DIAGNOSIS — R509 Fever, unspecified: Secondary | ICD-10-CM | POA: Diagnosis not present

## 2018-10-22 DIAGNOSIS — R51 Headache: Secondary | ICD-10-CM | POA: Diagnosis not present

## 2018-10-22 DIAGNOSIS — D509 Iron deficiency anemia, unspecified: Secondary | ICD-10-CM | POA: Diagnosis not present

## 2018-10-22 DIAGNOSIS — R252 Cramp and spasm: Secondary | ICD-10-CM | POA: Diagnosis not present

## 2018-10-22 DIAGNOSIS — N2581 Secondary hyperparathyroidism of renal origin: Secondary | ICD-10-CM | POA: Diagnosis not present

## 2018-10-22 DIAGNOSIS — Z794 Long term (current) use of insulin: Secondary | ICD-10-CM | POA: Diagnosis not present

## 2018-10-22 DIAGNOSIS — N186 End stage renal disease: Secondary | ICD-10-CM | POA: Diagnosis not present

## 2018-10-25 DIAGNOSIS — R252 Cramp and spasm: Secondary | ICD-10-CM | POA: Diagnosis not present

## 2018-10-25 DIAGNOSIS — R509 Fever, unspecified: Secondary | ICD-10-CM | POA: Diagnosis not present

## 2018-10-25 DIAGNOSIS — R51 Headache: Secondary | ICD-10-CM | POA: Diagnosis not present

## 2018-10-25 DIAGNOSIS — N186 End stage renal disease: Secondary | ICD-10-CM | POA: Diagnosis not present

## 2018-10-25 DIAGNOSIS — I953 Hypotension of hemodialysis: Secondary | ICD-10-CM | POA: Diagnosis not present

## 2018-10-25 DIAGNOSIS — D509 Iron deficiency anemia, unspecified: Secondary | ICD-10-CM | POA: Diagnosis not present

## 2018-10-26 ENCOUNTER — Observation Stay (HOSPITAL_COMMUNITY)
Admission: EM | Admit: 2018-10-26 | Discharge: 2018-10-27 | Disposition: A | Discharge status: Home / Self Care | Payer: Medicare Other | Attending: Internal Medicine | Admitting: Internal Medicine

## 2018-10-26 ENCOUNTER — Other Ambulatory Visit: Payer: Self-pay

## 2018-10-26 ENCOUNTER — Emergency Department (HOSPITAL_COMMUNITY): Payer: Medicare Other

## 2018-10-26 ENCOUNTER — Encounter (HOSPITAL_COMMUNITY): Payer: Self-pay | Admitting: Emergency Medicine

## 2018-10-26 DIAGNOSIS — E1122 Type 2 diabetes mellitus with diabetic chronic kidney disease: Secondary | ICD-10-CM | POA: Diagnosis not present

## 2018-10-26 DIAGNOSIS — N186 End stage renal disease: Secondary | ICD-10-CM | POA: Diagnosis not present

## 2018-10-26 DIAGNOSIS — D72828 Other elevated white blood cell count: Secondary | ICD-10-CM

## 2018-10-26 DIAGNOSIS — R42 Dizziness and giddiness: Secondary | ICD-10-CM | POA: Diagnosis not present

## 2018-10-26 DIAGNOSIS — R0902 Hypoxemia: Secondary | ICD-10-CM | POA: Diagnosis not present

## 2018-10-26 DIAGNOSIS — D631 Anemia in chronic kidney disease: Secondary | ICD-10-CM | POA: Insufficient documentation

## 2018-10-26 DIAGNOSIS — T8612 Kidney transplant failure: Secondary | ICD-10-CM | POA: Diagnosis not present

## 2018-10-26 DIAGNOSIS — X58XXXA Exposure to other specified factors, initial encounter: Secondary | ICD-10-CM | POA: Insufficient documentation

## 2018-10-26 DIAGNOSIS — E039 Hypothyroidism, unspecified: Secondary | ICD-10-CM | POA: Diagnosis not present

## 2018-10-26 DIAGNOSIS — I959 Hypotension, unspecified: Secondary | ICD-10-CM | POA: Diagnosis not present

## 2018-10-26 DIAGNOSIS — M8580 Other specified disorders of bone density and structure, unspecified site: Secondary | ICD-10-CM | POA: Insufficient documentation

## 2018-10-26 DIAGNOSIS — I9589 Other hypotension: Secondary | ICD-10-CM

## 2018-10-26 DIAGNOSIS — E785 Hyperlipidemia, unspecified: Secondary | ICD-10-CM | POA: Diagnosis not present

## 2018-10-26 DIAGNOSIS — D72825 Bandemia: Secondary | ICD-10-CM

## 2018-10-26 DIAGNOSIS — Z87891 Personal history of nicotine dependence: Secondary | ICD-10-CM | POA: Insufficient documentation

## 2018-10-26 DIAGNOSIS — Z20828 Contact with and (suspected) exposure to other viral communicable diseases: Secondary | ICD-10-CM | POA: Diagnosis not present

## 2018-10-26 DIAGNOSIS — I700 Atherosclerosis of aorta: Secondary | ICD-10-CM | POA: Insufficient documentation

## 2018-10-26 DIAGNOSIS — Z7952 Long term (current) use of systemic steroids: Secondary | ICD-10-CM | POA: Insufficient documentation

## 2018-10-26 DIAGNOSIS — Z79899 Other long term (current) drug therapy: Secondary | ICD-10-CM | POA: Insufficient documentation

## 2018-10-26 DIAGNOSIS — I10 Essential (primary) hypertension: Secondary | ICD-10-CM | POA: Diagnosis present

## 2018-10-26 DIAGNOSIS — Z794 Long term (current) use of insulin: Secondary | ICD-10-CM

## 2018-10-26 DIAGNOSIS — R110 Nausea: Secondary | ICD-10-CM | POA: Diagnosis not present

## 2018-10-26 DIAGNOSIS — D72829 Elevated white blood cell count, unspecified: Secondary | ICD-10-CM | POA: Diagnosis present

## 2018-10-26 DIAGNOSIS — E861 Hypovolemia: Secondary | ICD-10-CM

## 2018-10-26 DIAGNOSIS — E860 Dehydration: Principal | ICD-10-CM

## 2018-10-26 DIAGNOSIS — Z8249 Family history of ischemic heart disease and other diseases of the circulatory system: Secondary | ICD-10-CM | POA: Insufficient documentation

## 2018-10-26 DIAGNOSIS — E7849 Other hyperlipidemia: Secondary | ICD-10-CM

## 2018-10-26 DIAGNOSIS — R531 Weakness: Secondary | ICD-10-CM

## 2018-10-26 DIAGNOSIS — E139 Other specified diabetes mellitus without complications: Secondary | ICD-10-CM | POA: Diagnosis present

## 2018-10-26 DIAGNOSIS — E0590 Thyrotoxicosis, unspecified without thyrotoxic crisis or storm: Secondary | ICD-10-CM | POA: Insufficient documentation

## 2018-10-26 DIAGNOSIS — Z992 Dependence on renal dialysis: Secondary | ICD-10-CM | POA: Insufficient documentation

## 2018-10-26 DIAGNOSIS — I120 Hypertensive chronic kidney disease with stage 5 chronic kidney disease or end stage renal disease: Secondary | ICD-10-CM | POA: Diagnosis not present

## 2018-10-26 DIAGNOSIS — D513 Other dietary vitamin B12 deficiency anemia: Secondary | ICD-10-CM | POA: Diagnosis not present

## 2018-10-26 DIAGNOSIS — E119 Type 2 diabetes mellitus without complications: Secondary | ICD-10-CM

## 2018-10-26 DIAGNOSIS — M109 Gout, unspecified: Secondary | ICD-10-CM | POA: Diagnosis not present

## 2018-10-26 DIAGNOSIS — R031 Nonspecific low blood-pressure reading: Secondary | ICD-10-CM | POA: Diagnosis not present

## 2018-10-26 LAB — CBC WITH DIFFERENTIAL/PLATELET
Abs Immature Granulocytes: 2.08 10*3/uL — ABNORMAL HIGH (ref 0.00–0.07)
Abs Immature Granulocytes: 0.5 10*3/uL — ABNORMAL HIGH (ref 0.00–0.07)
Basophils Absolute: 0.1 10*3/uL (ref 0.0–0.1)
Basophils Absolute: 0.3 10*3/uL — ABNORMAL HIGH (ref 0.0–0.1)
Basophils Relative: 0 %
Basophils Relative: 1 %
Eosinophils Absolute: 0 10*3/uL (ref 0.0–0.5)
Eosinophils Absolute: 0 10*3/uL (ref 0.0–0.5)
Eosinophils Relative: 0 %
Eosinophils Relative: 0 %
HCT: 37.1 % (ref 36.0–46.0)
HCT: 37.6 % (ref 36.0–46.0)
Hemoglobin: 12.1 g/dL (ref 12.0–15.0)
Hemoglobin: 12.2 g/dL (ref 12.0–15.0)
Immature Granulocytes: 8 %
Lymphocytes Relative: 4 %
Lymphocytes Relative: 6 %
Lymphs Abs: 0.9 10*3/uL (ref 0.7–4.0)
Lymphs Abs: 1.6 10*3/uL (ref 0.7–4.0)
MCH: 32.2 pg (ref 26.0–34.0)
MCH: 32.1 pg (ref 26.0–34.0)
MCHC: 32.6 g/dL (ref 30.0–36.0)
MCHC: 32.4 g/dL (ref 30.0–36.0)
MCV: 98.7 fL (ref 80.0–100.0)
MCV: 98.9 fL (ref 80.0–100.0)
Monocytes Absolute: 1.9 10*3/uL — ABNORMAL HIGH (ref 0.1–1.0)
Monocytes Absolute: 0.8 10*3/uL (ref 0.1–1.0)
Monocytes Relative: 8 %
Monocytes Relative: 3 %
Neutro Abs: 20.3 10*3/uL — ABNORMAL HIGH (ref 1.7–7.7)
Neutro Abs: 23.4 10*3/uL — ABNORMAL HIGH (ref 1.7–7.7)
Neutrophils Relative %: 80 %
Neutrophils Relative %: 88 %
Platelets: 123 10*3/uL — ABNORMAL LOW (ref 150–400)
Platelets: 117 10*3/uL — ABNORMAL LOW (ref 150–400)
Promyelocytes Relative: 2 %
RBC: 3.76 MIL/uL — ABNORMAL LOW (ref 3.87–5.11)
RBC: 3.8 MIL/uL — ABNORMAL LOW (ref 3.87–5.11)
RDW: 14.4 % (ref 11.5–15.5)
RDW: 14.4 % (ref 11.5–15.5)
WBC Morphology: INCREASED
WBC: 25.3 10*3/uL — ABNORMAL HIGH (ref 4.0–10.5)
WBC: 26.6 10*3/uL — ABNORMAL HIGH (ref 4.0–10.5)
nRBC: 0 % (ref 0.0–0.2)
nRBC: 0 % (ref 0.0–0.2)
nRBC: 0 /100 WBC

## 2018-10-26 LAB — COMPREHENSIVE METABOLIC PANEL
ALT: 62 U/L — ABNORMAL HIGH (ref 0–44)
ALT: 54 U/L — ABNORMAL HIGH (ref 0–44)
AST: 76 U/L — ABNORMAL HIGH (ref 15–41)
AST: 62 U/L — ABNORMAL HIGH (ref 15–41)
Albumin: 3.2 g/dL — ABNORMAL LOW (ref 3.5–5.0)
Albumin: 3.2 g/dL — ABNORMAL LOW (ref 3.5–5.0)
Alkaline Phosphatase: 116 U/L (ref 38–126)
Alkaline Phosphatase: 108 U/L (ref 38–126)
Anion gap: 15 (ref 5–15)
Anion gap: 12 (ref 5–15)
BUN: 58 mg/dL — ABNORMAL HIGH (ref 8–23)
BUN: 66 mg/dL — ABNORMAL HIGH (ref 8–23)
CO2: 24 mmol/L (ref 22–32)
CO2: 28 mmol/L (ref 22–32)
Calcium: 9.9 mg/dL (ref 8.9–10.3)
Calcium: 9.7 mg/dL (ref 8.9–10.3)
Chloride: 97 mmol/L — ABNORMAL LOW (ref 98–111)
Chloride: 96 mmol/L — ABNORMAL LOW (ref 98–111)
Creatinine, Ser: 8.43 mg/dL — ABNORMAL HIGH (ref 0.44–1.00)
Creatinine, Ser: 8.97 mg/dL — ABNORMAL HIGH (ref 0.44–1.00)
GFR calc Af Amer: 5 mL/min — ABNORMAL LOW (ref >60)
GFR calc Af Amer: 5 mL/min — ABNORMAL LOW (ref >60)
GFR calc non Af Amer: 5 mL/min — ABNORMAL LOW (ref >60)
GFR calc non Af Amer: 4 mL/min — ABNORMAL LOW (ref >60)
Glucose, Bld: 134 mg/dL — ABNORMAL HIGH (ref 70–99)
Glucose, Bld: 109 mg/dL — ABNORMAL HIGH (ref 70–99)
Potassium: 4.9 mmol/L (ref 3.5–5.1)
Potassium: 4.5 mmol/L (ref 3.5–5.1)
Sodium: 136 mmol/L (ref 135–145)
Sodium: 136 mmol/L (ref 135–145)
Total Bilirubin: 1.1 mg/dL (ref 0.3–1.2)
Total Bilirubin: 1.3 mg/dL — ABNORMAL HIGH (ref 0.3–1.2)
Total Protein: 6.2 g/dL — ABNORMAL LOW (ref 6.5–8.1)
Total Protein: 5.9 g/dL — ABNORMAL LOW (ref 6.5–8.1)

## 2018-10-26 LAB — CBC
HCT: 33.2 % — ABNORMAL LOW (ref 36.0–46.0)
Hemoglobin: 11 g/dL — ABNORMAL LOW (ref 12.0–15.0)
MCH: 32.2 pg (ref 26.0–34.0)
MCHC: 33.1 g/dL (ref 30.0–36.0)
MCV: 97.1 fL (ref 80.0–100.0)
Platelets: 111 10*3/uL — ABNORMAL LOW (ref 150–400)
RBC: 3.42 MIL/uL — ABNORMAL LOW (ref 3.87–5.11)
RDW: 14.4 % (ref 11.5–15.5)
WBC: 25.9 10*3/uL — ABNORMAL HIGH (ref 4.0–10.5)
nRBC: 0 % (ref 0.0–0.2)

## 2018-10-26 LAB — LACTIC ACID, PLASMA
Lactic Acid, Venous: 2.4 mmol/L (ref 0.5–1.9)
Lactic Acid, Venous: 2 mmol/L (ref 0.5–1.9)

## 2018-10-26 LAB — GLUCOSE, CAPILLARY: Glucose-Capillary: 105 mg/dL — ABNORMAL HIGH (ref 70–99)

## 2018-10-26 LAB — URINALYSIS, MICROSCOPIC (REFLEX): Squamous Epithelial / LPF: >50 (ref 0–5)

## 2018-10-26 LAB — URINALYSIS, ROUTINE W REFLEX MICROSCOPIC
Glucose, UA: NEGATIVE mg/dL
Hgb urine dipstick: NEGATIVE
Ketones, ur: 15 mg/dL — AB
Nitrite: NEGATIVE
Protein, ur: >300 mg/dL — AB
Specific Gravity, Urine: 1.025 (ref 1.005–1.030)
pH: 5 (ref 5.0–8.0)

## 2018-10-26 LAB — SARS CORONAVIRUS 2 BY RT PCR (HOSPITAL ORDER, PERFORMED IN ~~LOC~~ HOSPITAL LAB): SARS Coronavirus 2: NEGATIVE

## 2018-10-26 LAB — TROPONIN I: Troponin I: 0.09 ng/mL (ref <0.03)

## 2018-10-26 MED ORDER — SODIUM CHLORIDE 0.9% FLUSH: 3.0000 mL | INTRAVENOUS | Status: DC | PRN

## 2018-10-26 MED ORDER — CAMPHOR-MENTHOL 0.5-0.5 % EX LOTN
1.0000 "application " | TOPICAL_LOTION | Freq: Three times a day (TID) | CUTANEOUS | Status: DC | PRN
  Filled 2018-10-26: qty 222

## 2018-10-26 MED ORDER — HYDROXYZINE HCL 25 MG PO TABS: 25.0000 mg | ORAL_TABLET | Freq: Three times a day (TID) | ORAL | Status: DC | PRN

## 2018-10-26 MED ORDER — VANCOMYCIN HCL IN DEXTROSE 1-5 GM/200ML-% IV SOLN: 1000.0000 mg | Freq: Once | INTRAVENOUS | Status: DC

## 2018-10-26 MED ORDER — ACETAMINOPHEN 650 MG RE SUPP: 650.0000 mg | Freq: Four times a day (QID) | RECTAL | Status: DC | PRN

## 2018-10-26 MED ORDER — ZOLPIDEM TARTRATE 5 MG PO TABS: 5.0000 mg | ORAL_TABLET | Freq: Every evening | ORAL | Status: DC | PRN

## 2018-10-26 MED ORDER — DOCUSATE SODIUM 283 MG RE ENEM
1.0000 | ENEMA | RECTAL | Status: DC | PRN
  Filled 2018-10-26: qty 1

## 2018-10-26 MED ORDER — INSULIN ASPART 100 UNIT/ML ~~LOC~~ SOLN: 0.0000 [IU] | Freq: Three times a day (TID) | SUBCUTANEOUS | Status: DC

## 2018-10-26 MED ORDER — SODIUM CHLORIDE 0.9% FLUSH
3.0000 mL | Freq: Two times a day (BID) | INTRAVENOUS | Status: DC
  Administered 2018-10-26: 3 mL via INTRAVENOUS

## 2018-10-26 MED ORDER — ONDANSETRON HCL 4 MG PO TABS: 4.0000 mg | ORAL_TABLET | Freq: Four times a day (QID) | ORAL | Status: DC | PRN

## 2018-10-26 MED ORDER — SORBITOL 70 % SOLN: 30.0000 mL | Status: DC | PRN

## 2018-10-26 MED ORDER — SODIUM CHLORIDE 0.9 % IV SOLN: 250.0000 mL | INTRAVENOUS | Status: DC | PRN

## 2018-10-26 MED ORDER — ONDANSETRON HCL 4 MG/2ML IJ SOLN: 4.0000 mg | Freq: Four times a day (QID) | INTRAMUSCULAR | Status: DC | PRN

## 2018-10-26 MED ORDER — HEPARIN SODIUM (PORCINE) 5000 UNIT/ML IJ SOLN
5000.0000 [IU] | Freq: Three times a day (TID) | INTRAMUSCULAR | Status: DC
  Administered 2018-10-26 – 2018-10-27 (×2): 5000 [IU] via SUBCUTANEOUS
  Filled 2018-10-26: qty 1

## 2018-10-26 MED ORDER — NEPRO/CARBSTEADY PO LIQD
237.0000 mL | Freq: Three times a day (TID) | ORAL | Status: DC | PRN
  Filled 2018-10-26: qty 237

## 2018-10-26 MED ORDER — SODIUM CHLORIDE 0.9 % IV BOLUS
500.0000 mL | Freq: Once | INTRAVENOUS | Status: AC
  Administered 2018-10-26: 15:00:00 500 mL via INTRAVENOUS

## 2018-10-26 MED ORDER — SODIUM CHLORIDE 0.9 % IV SOLN
2.0000 g | Freq: Once | INTRAVENOUS | Status: AC
  Administered 2018-10-26: 19:00:00 2 g via INTRAVENOUS
  Filled 2018-10-26: qty 2

## 2018-10-26 MED ORDER — ACETAMINOPHEN 325 MG PO TABS: 650.0000 mg | ORAL_TABLET | Freq: Four times a day (QID) | ORAL | Status: DC | PRN

## 2018-10-26 MED ORDER — CALCIUM CARBONATE ANTACID 1250 MG/5ML PO SUSP
500.0000 mg | Freq: Four times a day (QID) | ORAL | Status: DC | PRN
  Filled 2018-10-26: qty 5

## 2018-10-26 MED ORDER — SODIUM CHLORIDE 0.9 % IV BOLUS
500.0000 mL | Freq: Once | INTRAVENOUS | Status: AC
  Administered 2018-10-26: 19:00:00 500 mL via INTRAVENOUS

## 2018-10-26 NOTE — ED Triage Notes (Signed)
Pt arrives to ED from home with complaints of hypotension, weakness, and dizziness since being dialyzed yesterday. Pt stated she does her dialysis at home with her husband and yesterday her dry weight did not change but she still took off 234ml of fluid and since tghen she felt off. EMS reports BP 79/50 at home. Pt does dialysis Sunday, Monday, Wednesday and Thursday.

## 2018-10-26 NOTE — ED Notes (Signed)
ED Provider at bedside. 

## 2018-10-26 NOTE — ED Provider Notes (Addendum)
Signed out to Dr Sherry Ruffing that patient hd recently increased, that pt dry, bp soft, and that is receiving ivf, and labs.   Initially bp had trended up post ivf, but most recent bp is 83/56. Patient denies faintness or dizziness, but bp clearly below her baseline. On review labs (delayed due to computer issues), wbc is very high, 25, with marked bandemia. Pt denies any new symptoms. No headaches. No neck pain or stiffness. No chest pain or sob. No abd pain or nv. No dysuria or gu c/o. No extremity/skin pain or lesions. No neck stiffness or rigidity on exam. LUE av fistula w palp thrill and no pain/infection, abd soft nt, no cellulitis. Pt denies cough or fever.   Given persistent low bp, hr upper 90's, leucocytosis w left shift - will repeat lactate, add blood cultures, add troponin, repeat cbc to verify, empirically give dose iv abx, and admit for continued care.         Lajean Saver, MD 10/26/18 [phone]

## 2018-10-26 NOTE — H&P (Signed)
History and Physical   Kimberly [name] GQQ:[id number] DOB: 13-Nov-1955 DOA: 10/26/2018  Referring MD/NP/PA: Dr. Lajean Saver  PCP: Marton Redwood, MD   Outpatient Specialists: [facility]  Patient coming from: Home  Chief Complaint: Weakness and low blood pressure  HPI: Kimberly [name] is a 63 y.o. 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  She dosed hemodialysis on Sunday some Mondays and then rest on Tuesdays then Wednesdays and Thursdays.  Patient's last predialysis weight was 75.2.  That was yesterday.  She came in today with significant weakness.  Systolic blood pressure was in the 80s.  Patient has received 500 cc bolus x2 of IV fluids.  Systolic initially went up to 100 and dropped back into the 80s.  She was visibly dehydrated.  Work-up in the ER however shows a white count of 25,000.  No fever or chills.  Her urinalysis is not convincing.  Chest x-ray also showed no pneumonia.  Patient however is being admitted for observation and possible sepsis but less likely.  Mainly observing blood pressure overnight. ED Course: Temperature is 98.9 blood pressure initially 84/70 currently 90/53 with pulse 95 respiratory of 33 oxygen sat 92% room air.  Sodium is 139 potassium 4.5 chloride 98 CO2 29 with glucose 92.  Creatinine 8.4  Her white count is 25.3K,,Neutrophils of 20 Lactate 2.6.  Patient is therefore being admitted to the hospital for observation.  Suspected infection.  Based on urinalysis is cloudy urine with small leukocytes.  Review of Systems: As per HPI otherwise 10 point review of systems negative.    Past Medical History:  Diagnosis Date  . Adenomatous colon polyp 08/2007  . Anemia   . Diabetes mellitus type 2, controlled ([place])    type 2  . Dialysis patient ([place]) 01/2017   M-W-F  . End stage renal  disease ([place])     diaylis sunday tuesdays and thurs does at home  . Goiter   . Gout   . Hyperlipidemia   . Hypertension   . Hyperthyroidism   . Kidney disease   . Kidney transplant failure    8 years ago  . Osteopenia   . Pneumonia    2018  . Seasonal allergies   . 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 AV FISTULA PLACEMENT Left 2008  . colonscopy  02/2016  . GANGLION CYST EXCISION Right 1994  . KIDNEY TRANSPLANT  June 2010  . MINOR FULGERATION OF ANAL CONDYLOMA N/A 07/31/2017   Procedure: EXCISION  FULGERATION OF ANAL CONDYLOMA;  Surgeon: Ileana Roup, MD;  Location: [facility];  Service: General;  Laterality: N/A;  . RECTAL EXAM UNDER ANESTHESIA N/A 07/03/2016   Procedure: RECTAL EXAM UNDER ANESTHESIA;  Surgeon: Greer Pickerel, MD;  Location: [facility];  Service: General;  Laterality: N/A;  . SHOULDER ARTHROSCOPY W/ ROTATOR CUFF REPAIR Right yrs ago  . UPPER GI ENDOSCOPY  sept   2017  . WART FULGURATION N/A 07/03/2016   Procedure: EXCISION FULGURATION ANAL WART EXCISIONAL BIOSY OF EXTERNAL HEMORROID;  Surgeon: Greer Pickerel, MD;  Location: [facility];  Service: General;  Laterality: N/A;     reports that she quit smoking about 21 years ago. Her smoking use included cigarettes. She has a 20.00 pack-year smoking history. She has  never used smokeless tobacco. She reports that she does not drink alcohol or use drugs.  No Known Allergies  Family History  Problem Relation Age of Onset  . Colon cancer Sister   . Colon cancer Brother   . Heart disease Mother   . Cancer - Other Father        gum to throat     Prior to Admission medications   Medication Sig Start Date End Date Taking? Authorizing Provider  ACCU-CHEK SMARTVIEW test strip 1 each by Other route 2 (two) times daily. Use twice daily as directed 11/21/15   [provider]  acetaminophen (TYLENOL) 500 MG tablet Take 500-1,000 mg by  mouth every 8 (eight) hours as needed for mild pain or headache (depends on pain if takes 1-2 tablets).    [provider]  amLODipine (NORVASC) 10 MG tablet Take 10 mg by mouth every evening.     [provider]  B-D ULTRAFINE III SHORT PEN 31G X 8 MM MISC Use twice daily as directed 11/22/15   [provider]  docusate sodium (COLACE) 100 MG capsule Take 100 mg by mouth every other day.    [provider]  ezetimibe-simvastatin (VYTORIN) 10-20 MG per tablet Take 1 tablet by mouth at bedtime.    [provider]  ferric citrate (AURYXIA) 1 GM 210 MG(Fe) tablet Take 420 mg by mouth 3 (three) times daily with meals.    [provider]  glipiZIDE (GLUCOTROL) 10 MG tablet Take 10 mg by mouth daily.     [provider]  insulin glargine (LANTUS) 100 UNIT/ML injection Inject 0.15 mLs (15 Units total) into the skin at bedtime. Patient taking differently: Inject 10 Units into the skin at bedtime.  06/25/16 07/31/17  Dessa Phi, DO  labetalol (NORMODYNE) 200 MG tablet Take 1 tablet (200 mg total) by mouth 2 (two) times daily. Patient taking differently: Take 200-300 mg by mouth 2 (two) times daily. Takes 1.5 tablet in the morning and 1 tablet in the evening 06/25/16   Dessa Phi, DO  omeprazole (PRILOSEC) 40 MG capsule Take 40 mg by mouth daily. 02/07/16   [provider]  predniSONE (DELTASONE) 5 MG tablet Take 5 mg by mouth daily.    [provider]    Physical Exam: Vitals:   10/26/18 1745 10/26/18 1830 10/26/18 1845 10/26/18 1900  BP: (!) 103/58 (!) 106/51 (!) 101/55 (!) 105/57  Pulse:    86  Resp: (!) 22 19  (!) 25  Temp:      TempSrc:      SpO2:    93%  Weight:      Height:          Constitutional: NAD, calm, comfortable Vitals:   10/26/18 1745 10/26/18 1830 10/26/18 1845 10/26/18 1900  BP: (!) 103/58 (!) 106/51 (!) 101/55 (!) 105/57  Pulse:    86  Resp: (!) 22 19  (!) 25  Temp:      TempSrc:      SpO2:     93%  Weight:      Height:       Eyes: PERRL, lids and conjunctivae normal ENMT: Mucous membranes are dry. Posterior pharynx clear of any exudate or lesions.Normal dentition.  Neck: normal, supple, no masses, no thyromegaly Respiratory: clear to auscultation bilaterally, no wheezing, no crackles. Normal respiratory effort. No accessory muscle use.  Cardiovascular: Regular rate and rhythm, no murmurs / rubs / gallops. No extremity edema. 2+ pedal pulses. No  carotid bruits.,  Dialysis access looks clean no evidence of infection nontender Abdomen: no tenderness, no masses palpated. No hepatosplenomegaly. Bowel sounds positive.  Musculoskeletal: no clubbing / cyanosis. No joint deformity upper and lower extremities. Good ROM, no contractures. Normal muscle tone.  Skin: no rashes, lesions, ulcers. No induration Neurologic: CN 2-12 grossly intact. Sensation intact, DTR normal. Strength 5/5 in all 4.  Psychiatric: Normal judgment and insight. Alert and oriented x 3. Normal mood.     Labs on Admission: I have personally reviewed following labs and imaging studies  CBC: No results for input(s): WBC, NEUTROABS, HGB, HCT, MCV, PLT in the last 168 hours. Basic Metabolic Panel: No results for input(s): NA, K, CL, CO2, GLUCOSE, BUN, CREATININE, CALCIUM, MG, PHOS in the last 168 hours. GFR: CrCl cannot be calculated (Patient's most recent lab result is older than the maximum 21 days allowed.). Liver Function Tests: No results for input(s): AST, ALT, ALKPHOS, BILITOT, PROT, ALBUMIN in the last 168 hours. No results for input(s): LIPASE, AMYLASE in the last 168 hours. No results for input(s): AMMONIA in the last 168 hours. Coagulation Profile: No results for input(s): INR, PROTIME in the last 168 hours. Cardiac Enzymes: No results for input(s): CKTOTAL, CKMB, CKMBINDEX, TROPONINI in the last 168 hours. BNP (last 3 results) No results for input(s): PROBNP in the last 8760 hours. HbA1C: No results  for input(s): HGBA1C in the last 72 hours. CBG: No results for input(s): GLUCAP in the last 168 hours. Lipid Profile: No results for input(s): CHOL, HDL, LDLCALC, TRIG, CHOLHDL, LDLDIRECT in the last 72 hours. Thyroid Function Tests: No results for input(s): TSH, T4TOTAL, FREET4, T3FREE, THYROIDAB in the last 72 hours. Anemia Panel: No results for input(s): VITAMINB12, FOLATE, FERRITIN, TIBC, IRON, RETICCTPCT in the last 72 hours. Urine analysis:    Component Value Date/Time   COLORURINE YELLOW 10/26/2018 1443   APPEARANCEUR CLOUDY (A) 10/26/2018 1443   LABSPEC 1.025 10/26/2018 1443   PHURINE 5.0 10/26/2018 1443   GLUCOSEU NEGATIVE 10/26/2018 1443   HGBUR NEGATIVE 10/26/2018 1443   BILIRUBINUR SMALL (A) 10/26/2018 1443   KETONESUR 15 (A) 10/26/2018 1443   PROTEINUR >300 (A) 10/26/2018 1443   NITRITE NEGATIVE 10/26/2018 1443   LEUKOCYTESUR TRACE (A) 10/26/2018 1443   Sepsis Labs: @LABRCNTIP (procalcitonin:4,lacticidven:4) )No results found for this or any previous visit (from the past 240 hour(s)).   Radiological Exams on Admission: Dg Chest 2 View  Result Date: 10/26/2018 CLINICAL DATA:  Dizziness/low blood pressure x yesterday after dialysis, HTN, diabetic, past smoker - quit 20+ years ago Tech wore surgical mask/gloves, pt wore mask EXAM: CHEST - 2 VIEW COMPARISON:  06/24/2016 FINDINGS: Linear scarring or subsegmental atelectasis at the right lung base. Left lung clear. No overt edema. Heart size upper limits normal. Aortic Atherosclerosis (ICD10-170.0). No effusion. Visualized bones unremarkable. IMPRESSION: Right lower lobe linear scarring or atelectasis. Electronically Signed   By: Lucrezia Europe M.D.   On: 10/26/2018 15:15    EKG: Independently reviewed.  It shows sinus rhythm with a rate of 81.  Minimal ST elevation but unchanged from previous.  Assessment/Plan Principal Problem:   Hypotension Active Problems:   Secondary diabetes mellitus (H[facility])   HLD (hyperlipidemia)    Essential (primary) hypertension   Diabetes mellitus type 2, insulin dependent ([place])     #1 persistent hypotension: Patient's blood pressure remains low despite fluid resuscitation.  We will admit the patient for observation.  She is slowly improving.  We will use boluses of saline based on her  blood pressure.  Nephrology consulted to address her dialysis orders for home use.  She is probably over diuresing herself and becoming more dehydrated.  She received antibiotic in the ER for possible sepsis but I doubt it.  High increased white count may be due to demargination.  If she spikes a fever overnight then we will consider starting antibiotics again.  #2 leukocytosis: Again this could be secondary to dehydration and demargination.  No evidence of infection anywhere.  We will proceed as above.  #3 diabetes: Sliding scale insulin will be ordered.  Monitor patient.  #4 end-stage renal disease: Nephrology has been consulted patient will be followed in the morning.  #5 hypertension: Patient has a history of hypertension but with low blood pressure we will hold antihypertensives.  #6 hyperlipidemia: Resume home regimen.  Continue close monitoring.   DVT prophylaxis: Heparin Code Status: Full code Family Communication: Husband over the phone Disposition Plan: Home Consults called: Dr.: [facility] nephrology Admission status: Observation  Severity of Illness: The appropriate patient status for this patient is OBSERVATION. Observation status is judged to be reasonable and necessary in order to provide the required intensity of service to ensure the patient's safety. The patient's presenting symptoms, physical exam findings, and initial radiographic and laboratory data in the context of their medical condition is felt to place them at decreased risk for further clinical deterioration. Furthermore, it is anticipated that the patient will be medically stable for discharge from the hospital within 2  midnights of admission. The following factors support the patient status of observation.   " The patient's presenting symptoms include weakness and low blood pressure. " The physical exam findings include dry mucous membranes. " The initial radiographic and laboratory data are white count of 25,000.     Barbette Merino MD [facility] Pager [phone]  If 7PM-7AM, please contact night-coverage [email or web address] Password [email or web address]  10/26/2018, 7:16 PM

## 2018-10-26 NOTE — Progress Notes (Signed)
Troponin-0.09. ON call provider made aware. Awaiting orders. Will contrinue to monitor.

## 2018-10-26 NOTE — ED Provider Notes (Signed)
[facility] EMERGENCY DEPARTMENT Provider Note   CSN: [id number] Arrival date & time: 10/26/18  1425    History   Chief Complaint Chief Complaint  Patient presents with  . Hypotension  . Weakness    HPI Kimberly [name] is a 63 y.o. female.     The history is provided by medical records and the patient. No language interpreter was used.  Dizziness  Quality:  Lightheadedness Severity:  Severe Onset quality:  Gradual Duration:  2 days Timing:  Constant Progression:  Worsening Chronicity:  New Context: not with loss of consciousness   Context comment:  After dialysis Relieved by:  Nothing Worsened by:  Nothing Associated symptoms: no chest pain, no diarrhea, no headaches, no nausea, no palpitations, no shortness of breath, no syncope, no vomiting and no weakness     Past Medical History:  Diagnosis Date  . Adenomatous colon polyp 08/2007  . Anemia   . Diabetes mellitus type 2, controlled ([place])    type 2  . Dialysis patient ([place]) 01/2017   M-W-F  . End stage renal disease ([place])     diaylis sunday tuesdays and thurs does at home  . Goiter   . Gout   . Hyperlipidemia   . Hypertension   . Hyperthyroidism   . Kidney disease   . Kidney transplant failure    8 years ago  . Osteopenia   . Pneumonia    2018  . Seasonal allergies   . Vitamin B12 deficiency anemia due to intrinsic factor deficiency     Patient Active Problem List   Diagnosis Date Noted  . Condyloma acuminata 07/03/2016  . Diabetes mellitus type 2, insulin dependent ([place]) 06/24/2016  . Hypoglycemia 06/24/2016  . Generalized weakness 06/24/2016  . CKD (chronic kidney disease) stage 4, GFR 15-29 ml/min (HCC) 06/24/2016  . Diabetes mellitus with complication ([place])   . Anemia in chronic renal disease 02/12/2016  . Dyspnea 02/11/2016  . Iron deficiency anemia 02/05/2016  . Encounter for other preprocedural examination 08/28/2015  . Hyperparathyroidism due to renal insufficiency  ([place]) 08/21/2015  . Secondary diabetes mellitus ([place]) 08/21/2015  . Immunosuppression ([place]) 08/21/2015  . HLD (hyperlipidemia) 08/21/2015  . Essential (primary) hypertension 08/21/2015  . History of kidney transplant 08/21/2015  . Absolute anemia 08/21/2015  . Blood in stool 01/06/2013    Past Surgical History:  Procedure Laterality Date  . AV FISTULA PLACEMENT Left 2008  . colonscopy  02/2016  . GANGLION CYST EXCISION Right 1994  . KIDNEY TRANSPLANT  June 2010  . MINOR FULGERATION OF ANAL CONDYLOMA N/A 07/31/2017   Procedure: EXCISION  FULGERATION OF ANAL CONDYLOMA;  Surgeon: Ileana Roup, MD;  Location: [facility];  Service: General;  Laterality: N/A;  . RECTAL EXAM UNDER ANESTHESIA N/A 07/03/2016   Procedure: RECTAL EXAM UNDER ANESTHESIA;  Surgeon: Greer Pickerel, MD;  Location: [facility];  Service: General;  Laterality: N/A;  . SHOULDER ARTHROSCOPY W/ ROTATOR CUFF REPAIR Right yrs ago  . UPPER GI ENDOSCOPY  sept   2017  . WART FULGURATION N/A 07/03/2016   Procedure: EXCISION FULGURATION ANAL WART EXCISIONAL BIOSY OF EXTERNAL HEMORROID;  Surgeon: Greer Pickerel, MD;  Location: [facility];  Service: General;  Laterality: N/A;     OB History   No obstetric history on file.      Home Medications    Prior to Admission medications   Medication Sig Start Date End Date Taking? Authorizing Provider  ACCU-CHEK SMARTVIEW test strip 1 each by  Other route 2 (two) times daily. Use twice daily as directed 11/21/15   [provider]  acetaminophen (TYLENOL) 500 MG tablet Take 500-1,000 mg by mouth every 8 (eight) hours as needed for mild pain or headache (depends on pain if takes 1-2 tablets).    [provider]  amLODipine (NORVASC) 10 MG tablet Take 10 mg by mouth every evening.     [provider]  B-D ULTRAFINE III SHORT PEN 31G X 8 MM MISC Use twice daily as directed 11/22/15   [provider]  docusate sodium (COLACE) 100 MG capsule Take 100 mg by mouth every  other day.    [provider]  ezetimibe-simvastatin (VYTORIN) 10-20 MG per tablet Take 1 tablet by mouth at bedtime.    [provider]  ferric citrate (AURYXIA) 1 GM 210 MG(Fe) tablet Take 420 mg by mouth 3 (three) times daily with meals.    [provider]  glipiZIDE (GLUCOTROL) 10 MG tablet Take 10 mg by mouth daily.     [provider]  insulin glargine (LANTUS) 100 UNIT/ML injection Inject 0.15 mLs (15 Units total) into the skin at bedtime. Patient taking differently: Inject 10 Units into the skin at bedtime.  06/25/16 07/31/17  Dessa Phi, DO  labetalol (NORMODYNE) 200 MG tablet Take 1 tablet (200 mg total) by mouth 2 (two) times daily. Patient taking differently: Take 200-300 mg by mouth 2 (two) times daily. Takes 1.5 tablet in the morning and 1 tablet in the evening 06/25/16   Dessa Phi, DO  omeprazole (PRILOSEC) 40 MG capsule Take 40 mg by mouth daily. 02/07/16   [provider]  predniSONE (DELTASONE) 5 MG tablet Take 5 mg by mouth daily.    [provider]    Family History Family History  Problem Relation Age of Onset  . Colon cancer Sister   . Colon cancer Brother   . Heart disease Mother   . Cancer - Other Father        gum to throat    Social History Social History   Tobacco Use  . Smoking status: Former Smoker    Packs/day: 1.00    Years: 20.00    Pack years: 20.00    Types: Cigarettes    Last attempt to quit: 07/09/1997    Years since quitting: 21.3  . Smokeless tobacco: Never Used  Substance Use Topics  . Alcohol use: No    Frequency: Never  . Drug use: No     Allergies   Patient has no known allergies.   Review of Systems Review of Systems  Constitutional: Positive for fatigue. Negative for chills, diaphoresis and fever.  HENT: Negative for congestion.   Eyes: Negative for visual disturbance.  Respiratory: Negative for cough, chest tightness, shortness of breath and wheezing.    Cardiovascular: Negative for chest pain, palpitations and syncope.  Gastrointestinal: Negative for abdominal pain, constipation, diarrhea, nausea and vomiting.  Genitourinary: Positive for decreased urine volume. Negative for dysuria and flank pain.  Musculoskeletal: Negative for back pain, neck pain and neck stiffness.  Neurological: Positive for light-headedness. Negative for dizziness, facial asymmetry, weakness and headaches.  Psychiatric/Behavioral: Negative for agitation and confusion.  All other systems reviewed and are negative.    Physical Exam Updated Vital Signs BP (!) 90/53 (BP Location: Right Arm)   Pulse 84   Temp 98.9 F (37.2 C) (Oral)   Resp 20   Ht 5\' 6"  (1.676 m)   Wt 75 kg  SpO2 95%   BMI 26.69 kg/m   Physical Exam Vitals signs and nursing note reviewed.  Constitutional:      General: She is not in acute distress.    Appearance: She is well-developed. She is not ill-appearing, toxic-appearing or diaphoretic.  HENT:     Head: Normocephalic and atraumatic.     Nose: No congestion or rhinorrhea.     Mouth/Throat:     Mouth: Mucous membranes are dry.     Pharynx: No oropharyngeal exudate or posterior oropharyngeal erythema.  Eyes:     Conjunctiva/sclera: Conjunctivae normal.     Pupils: Pupils are equal, round, and reactive to light.  Neck:     Musculoskeletal: Neck supple. No muscular tenderness.  Cardiovascular:     Rate and Rhythm: Normal rate and regular rhythm.     Pulses: Normal pulses.     Heart sounds: No murmur.  Pulmonary:     Effort: Pulmonary effort is normal. No respiratory distress.     Breath sounds: Normal breath sounds. No wheezing, rhonchi or rales.  Chest:     Chest wall: No tenderness.  Abdominal:     Palpations: Abdomen is soft.     Tenderness: There is no abdominal tenderness. There is no right CVA tenderness, left CVA tenderness, guarding or rebound.  Skin:    General: Skin is warm and dry.     Capillary Refill: Capillary  refill takes less than 2 seconds.     Findings: No erythema.  Neurological:     General: No focal deficit present.     Mental Status: She is alert and oriented to person, place, and time.  Psychiatric:        Mood and Affect: Mood normal.      ED Treatments / Results  Labs (all labs ordered are listed, but only abnormal results are displayed) Labs Reviewed  URINE CULTURE  CBC WITH DIFFERENTIAL/PLATELET  COMPREHENSIVE METABOLIC PANEL  LACTIC ACID, PLASMA  LACTIC ACID, PLASMA  URINALYSIS, ROUTINE W REFLEX MICROSCOPIC    EKG EKG Interpretation  Date/Time:  Tuesday Oct 26 2018 14:29:17 EDT Ventricular Rate:  81 PR Interval:    QRS Duration: 81 QT Interval:  368 QTC Calculation: 428 R Axis:   88 Text Interpretation:  Sinus rhythm Probable left atrial enlargement Borderline right axis deviation Left ventricular hypertrophy Minimal ST elevation, inferior leads When compared to prior, no significant changes seen.  No STEMI Confirmed by Antony Blackbird [phone]) on 10/26/2018 2:31:32 PM   Radiology Dg Chest 2 View  Result Date: 10/26/2018 CLINICAL DATA:  Dizziness/low blood pressure x yesterday after dialysis, HTN, diabetic, past smoker - quit 20+ years ago Tech wore surgical mask/gloves, pt wore mask EXAM: CHEST - 2 VIEW COMPARISON:  06/24/2016 FINDINGS: Linear scarring or subsegmental atelectasis at the right lung base. Left lung clear. No overt edema. Heart size upper limits normal. Aortic Atherosclerosis (ICD10-170.0). No effusion. Visualized bones unremarkable. IMPRESSION: Right lower lobe linear scarring or atelectasis. Electronically Signed   By: Lucrezia Europe M.D.   On: 10/26/2018 15:15    Procedures Procedures (including critical care time)  Medications Ordered in ED Medications  sodium chloride 0.9 % bolus 500 mL (500 mLs Intravenous New Bag/Given 10/26/18 1520)     Initial Impression / Assessment and Plan / ED Course  I have reviewed the triage vital signs and the nursing  notes.  Pertinent labs & imaging results that were available during my care of the patient were reviewed by me and considered in  my medical decision making (see chart for details).        Kimberly [name] is a 63 y.o. female with a past medical history significant for ESRD with failed prior kidney transplant on home dialysis, hypertension, hyperlipidemia, diabetes, and hypothyroidism who presents with hypotension.  Patient reports that she was feeling lightheaded yesterday and was told she likely had dehydration.  She reports that she was at her dry weight this morning and still took dialysis taking 200 mL of fluid off.  Since then she has felt more lightheaded and fatigued and she was found to have a blood pressure of 79/50 at home.  Patient was sent in for evaluation and likely rehydration.  Patient says that she does still make urine but today had significant decreased urine.  She reports no fevers, chills, chest pain, palpitations, shortness of breath, nausea, vomiting, constipation, or diarrhea.  Her mouth does feel dry.  She thinks that she was too dehydrated and took fluid off causing her to be hypotensive.  On exam, patient's mouth is dry mucous membranes are dry.  Lungs are clear and chest is nontender.  Abdomen is nontender.  Flanks and back are nontender.  Patient has no edema in her legs and has normal sensation and strength in extremities.  Blood pressure is 90/53 initially.  Shared decision making conversation was held with patient.  Patient thinks he is just dehydrated however we agreed to check some blood work, chest x-ray and urinalysis to look for occult infection.  We will give her 500 cc of fluid as she does not appear fluid overloaded whatsoever.  If blood pressures improve, she remains asymptomatic, and her work-up is reassuring, anticipate discharge home.  Patient is agreeable this plan.  Care transferred to oncoming team while awaiting for reassessment after rehydration,  diagnostic laboratory testing, and monitoring.  Anticipate discharge if BP improves.     Final Clinical Impressions(s) / ED Diagnoses   Final diagnoses:  Dehydration  Hypotension, unspecified hypotension type    Clinical Impression: 1. Dehydration   2. Hypotension, unspecified hypotension type     Disposition: Care transferred to Dr. Ashok Cordia while awaiting results of diagnostic work-up and rehydration.  Anticipate discharge.  This note was prepared with assistance of Systems analyst. Occasional wrong-word or sound-a-like substitutions may have occurred due to the inherent limitations of voice recognition software.    Sherry Ruffing, Gwenyth Allegra, MD 10/26/18 [phone]

## 2018-10-26 NOTE — ED Notes (Signed)
Received call from lab. Lactic acid 2.0

## 2018-10-26 NOTE — ED Notes (Signed)
ED TO INPATIENT HANDOFF REPORT  ED Nurse Name and Phone #: [phone]  S Name/Age/Gender Kimberly [name] 63 y.o. female Room/Bed: 035C/035C  Code Status   Code Status: Prior  Home/SNF/Other Home Patient oriented to: self, place, time and situation Is this baseline? Yes   Triage Complete: Triage complete  Chief Complaint hypotensive/dialyis pt  Triage Note Pt arrives to ED from home with complaints of hypotension, weakness, and dizziness since being dialyzed yesterday. Pt stated she does her dialysis at home with her husband and yesterday her dry weight did not change but she still took off 25ml of fluid and since tghen she felt off. EMS reports BP 79/50 at home. Pt does dialysis Sunday, Monday, Wednesday and Thursday.    Allergies No Known Allergies  Level of Care/Admitting Diagnosis ED Disposition    ED Disposition Condition [facility]: [facility] [place] [[id number]]  Level of Care: Med-Surg [16]  I expect the patient will be discharged within 24 hours: Yes  LOW acuity---Tx typically complete <24 hrs---ACUTE conditions typically can be evaluated <24 hours---LABS likely to return to acceptable levels <24 hours---IS near functional baseline---EXPECTED to return to current living arrangement---NOT newly hypoxic: Meets criteria for 5C-Observation unit  Covid Evaluation: N/A  Diagnosis: Hypotension [867672]  Admitting Physician: Elwyn Reach [[id number]]  Attending Physician: Elwyn Reach [[id number]]  PT Class (Do Not Modify): Observation [104]  PT Acc Code (Do Not Modify): Observation [10022]       B Medical/Surgery History Past Medical History:  Diagnosis Date  . Adenomatous colon polyp 08/2007  . Anemia   . Diabetes mellitus type 2, controlled ([place])    type 2  . Dialysis patient ([facility]) 01/2017   M-W-F  . End stage renal disease ([place])     diaylis sunday tuesdays and thurs does at home  . Goiter   . Gout   . Hyperlipidemia   .  Hypertension   . Hyperthyroidism   . Kidney disease   . Kidney transplant failure    8 years ago  . Osteopenia   . Pneumonia    2018  . Seasonal allergies   . Vitamin B12 deficiency anemia due to intrinsic factor deficiency    Past Surgical History:  Procedure Laterality Date  . AV FISTULA PLACEMENT Left 2008  . colonscopy  02/2016  . GANGLION CYST EXCISION Right 1994  . KIDNEY TRANSPLANT  June 2010  . MINOR FULGERATION OF ANAL CONDYLOMA N/A 07/31/2017   Procedure: EXCISION  FULGERATION OF ANAL CONDYLOMA;  Surgeon: Ileana Roup, MD;  Location: [facility];  Service: General;  Laterality: N/A;  . RECTAL EXAM UNDER ANESTHESIA N/A 07/03/2016   Procedure: RECTAL EXAM UNDER ANESTHESIA;  Surgeon: Greer Pickerel, MD;  Location: [facility];  Service: General;  Laterality: N/A;  . SHOULDER ARTHROSCOPY W/ ROTATOR CUFF REPAIR Right yrs ago  . UPPER GI ENDOSCOPY  sept   2017  . WART FULGURATION N/A 07/03/2016   Procedure: EXCISION FULGURATION ANAL WART EXCISIONAL BIOSY OF EXTERNAL HEMORROID;  Surgeon: Greer Pickerel, MD;  Location: [facility];  Service: General;  Laterality: N/A;     A IV Location/Drains/Wounds Patient Lines/Drains/Airways Status   Active Line/Drains/Airways    Name:   Placement date:   Placement time:   Site:   Days:   Peripheral IV 10/26/18 Right Antecubital   10/26/18    1433    Antecubital   less than 1   Fistula / Graft Left Upper arm Arteriovenous  fistula   -    -    Upper arm      Incision (Closed) 07/03/16 Rectum Other (Comment)   07/03/16    0844     845   Incision (Closed) 04/23/17 Other (Comment)   04/23/17    1118     551   Incision (Closed) 07/31/17 Rectum Other (Comment)   07/31/17    1159     452          Intake/Output Last 24 hours  Intake/Output Summary (Last 24 hours) at 10/26/2018 1900 Last data filed at 10/26/2018 1729 Gross per 24 hour  Intake 500 ml  Output -  Net 500 ml    Labs/Imaging Results for orders placed or performed during the hospital encounter  of 10/26/18 (from the past 48 hour(s))  Urinalysis, Routine w reflex microscopic     Status: Abnormal   Collection Time: 10/26/18  2:43 PM  Result Value Ref Range   Color, Urine YELLOW YELLOW   APPearance CLOUDY (A) CLEAR   Specific Gravity, Urine 1.025 1.005 - 1.030   pH 5.0 5.0 - 8.0   Glucose, UA NEGATIVE NEGATIVE mg/dL   Hgb urine dipstick NEGATIVE NEGATIVE   Bilirubin Urine SMALL (A) NEGATIVE   Ketones, ur 15 (A) NEGATIVE mg/dL   Protein, ur >300 (A) NEGATIVE mg/dL   Nitrite NEGATIVE NEGATIVE   Leukocytes,Ua TRACE (A) NEGATIVE    Comment: Performed at [facility], [address], [place], [place] [address]  Urinalysis, Microscopic (reflex)     Status: Abnormal   Collection Time: 10/26/18  2:43 PM  Result Value Ref Range   RBC / HPF 0-5 0 - 5 RBC/hpf   WBC, UA 0-5 0 - 5 WBC/hpf   Bacteria, UA MANY (A) NONE SEEN   Squamous Epithelial / LPF >50 0 - 5   Urine-Other LESS THAN 10 mL OF URINE SUBMITTED     Comment: MICROSCOPIC EXAM PERFORMED ON UNCONCENTRATED URINE Performed at [facility], [place] [address], [place], [place] [address]    Dg Chest 2 View  Result Date: 10/26/2018 CLINICAL DATA:  Dizziness/low blood pressure x yesterday after dialysis, HTN, diabetic, past smoker - quit 20+ years ago Tech wore surgical mask/gloves, pt wore mask EXAM: CHEST - 2 VIEW COMPARISON:  06/24/2016 FINDINGS: Linear scarring or subsegmental atelectasis at the right lung base. Left lung clear. No overt edema. Heart size upper limits normal. Aortic Atherosclerosis (ICD10-170.0). No effusion. Visualized bones unremarkable. IMPRESSION: Right lower lobe linear scarring or atelectasis. Electronically Signed   By: Lucrezia Europe M.D.   On: 10/26/2018 15:15    Pending Labs Unresulted Labs (From admission, onward)    Start     Ordered   10/26/18 1846  Troponin I - ONCE - STAT  ONCE - STAT,   STAT     10/26/18 1845   10/26/18 1830  SARS Coronavirus 2 (CEPHEID - Performed in [facility]  lab), Hosp Order  (Asymptomatic Patients Labs)  Once,   R    Question:  Rule Out  Answer:  Yes   10/26/18 1829   10/26/18 1828  Lactic acid, plasma  Once,   STAT    Comments:  Repeat lactate now    10/26/18 1828   10/26/18 1828  CBC WITH DIFFERENTIAL  ONCE - STAT,   STAT    Comments:  Re-draw for repeat cbc    10/26/18 1828   10/26/18 1827  Blood Culture (routine x 2)  BLOOD CULTURE X 2,   STAT     10/26/18 1828   10/26/18 1704  Comprehensive metabolic panel  Once,   R     10/26/18 1704   10/26/18 1444  Urine culture  ONCE - STAT,   STAT     10/26/18 1443   10/26/18 1443  CBC with Differential  ONCE - STAT,   STAT     10/26/18 1443   10/26/18 1443  Comprehensive metabolic panel  ONCE - STAT,   STAT     10/26/18 1443          Vitals/Pain Today's Vitals   10/26/18 1645 10/26/18 1745 10/26/18 1830 10/26/18 1845  BP: (!) 100/53 (!) 103/58 (!) 106/51 (!) 101/55  Pulse: 95     Resp: (!) 21 (!) 22 19   Temp:      TempSrc:      SpO2: 93%     Weight:      Height:      PainSc:        Isolation Precautions No active isolations  Medications Medications  ceFEPIme (MAXIPIME) 2 g in sodium chloride 0.9 % 100 mL IVPB (2 g Intravenous New Bag/Given 10/26/18 1855)  vancomycin (VANCOCIN) IVPB 1000 mg/200 mL premix (has no administration in time range)  sodium chloride 0.9 % bolus 500 mL (0 mLs Intravenous Stopped 10/26/18 1729)  sodium chloride 0.9 % bolus 500 mL (500 mLs Intravenous New Bag/Given 10/26/18 1856)    Mobility walks Low fall risk   Focused Assessments Renal Assessment Handoff:  Hemodialysis Schedule: Sunday, Monday, Wednesday, Thursday Last Hemodialysis date and time: Yesterday   Restricted appendage: Left arm    R Recommendations: See Admitting Provider Note  Report given to:   Additional Notes:  Per EDP  Post ivf, but most recent bp is 83/56. Patient denies faintness or dizziness, but bp clearly below her baseline. On review labs (delayed due to computer  issues), wbc is very high, 25, with marked bandemia. Pt denies any new symptoms. No headaches. No neck pain or stiffness. No chest pain or sob. No abd pain or nv. No dysuria or gu c/o. No extremity/skin pain or lesions. No neck stiffness or rigidity on exam. LUE av fistula w palp thrill and no pain/infection, abd soft nt, no cellulitis. Pt denies cough or fever.   Given persistent low bp, hr upper 90's, leucocytosis w left shift - will repeat lactate, add blood cultures, add troponin, repeat cbc to verify, empirically give dose iv abx, and admit for continued care.

## 2018-10-26 NOTE — Progress Notes (Signed)
New Admission Note:  Arrival Method: Via wheelchair  Mental Orientation: Alert & Oriented x4 Telemetry: CCMD verified Assessment: Completed Skin: Refer to flowsheet IV: Right AC Pain: 0/10 Safety Measures: Safety Fall Prevention Plan discussed with patient. Admission: Completed [facility] [place] Orientation: Patient has been orientated to the room, unit and the staff.  Orders have been reviewed and are being implemented. Will continue to monitor the patient. Call light has been placed within reach.   Vassie Moselle, RN  Phone Number: [phone]

## 2018-10-27 DIAGNOSIS — D72829 Elevated white blood cell count, unspecified: Secondary | ICD-10-CM | POA: Diagnosis present

## 2018-10-27 DIAGNOSIS — Z992 Dependence on renal dialysis: Secondary | ICD-10-CM

## 2018-10-27 DIAGNOSIS — N250 Renal osteodystrophy: Secondary | ICD-10-CM | POA: Diagnosis not present

## 2018-10-27 DIAGNOSIS — E1122 Type 2 diabetes mellitus with diabetic chronic kidney disease: Secondary | ICD-10-CM | POA: Diagnosis not present

## 2018-10-27 DIAGNOSIS — R252 Cramp and spasm: Secondary | ICD-10-CM | POA: Diagnosis not present

## 2018-10-27 DIAGNOSIS — E860 Dehydration: Secondary | ICD-10-CM | POA: Diagnosis not present

## 2018-10-27 DIAGNOSIS — D509 Iron deficiency anemia, unspecified: Secondary | ICD-10-CM | POA: Diagnosis not present

## 2018-10-27 DIAGNOSIS — R509 Fever, unspecified: Secondary | ICD-10-CM | POA: Diagnosis not present

## 2018-10-27 DIAGNOSIS — R51 Headache: Secondary | ICD-10-CM | POA: Diagnosis not present

## 2018-10-27 DIAGNOSIS — E861 Hypovolemia: Secondary | ICD-10-CM | POA: Diagnosis not present

## 2018-10-27 DIAGNOSIS — N186 End stage renal disease: Secondary | ICD-10-CM | POA: Diagnosis not present

## 2018-10-27 DIAGNOSIS — I953 Hypotension of hemodialysis: Secondary | ICD-10-CM | POA: Diagnosis not present

## 2018-10-27 DIAGNOSIS — D631 Anemia in chronic kidney disease: Secondary | ICD-10-CM | POA: Diagnosis not present

## 2018-10-27 DIAGNOSIS — I9589 Other hypotension: Secondary | ICD-10-CM | POA: Diagnosis not present

## 2018-10-27 DIAGNOSIS — I959 Hypotension, unspecified: Secondary | ICD-10-CM | POA: Diagnosis not present

## 2018-10-27 LAB — CBC
HCT: 33 % — ABNORMAL LOW (ref 36.0–46.0)
Hemoglobin: 11 g/dL — ABNORMAL LOW (ref 12.0–15.0)
MCH: 32.1 pg (ref 26.0–34.0)
MCHC: 33.3 g/dL (ref 30.0–36.0)
MCV: 96.2 fL (ref 80.0–100.0)
Platelets: 104 10*3/uL — ABNORMAL LOW (ref 150–400)
RBC: 3.43 MIL/uL — ABNORMAL LOW (ref 3.87–5.11)
RDW: 14.3 % (ref 11.5–15.5)
WBC: 21.7 10*3/uL — ABNORMAL HIGH (ref 4.0–10.5)
nRBC: 0 % (ref 0.0–0.2)

## 2018-10-27 LAB — RENAL FUNCTION PANEL
Albumin: 3 g/dL — ABNORMAL LOW (ref 3.5–5.0)
Albumin: 2 g/dL — ABNORMAL LOW (ref 3.5–5.0)
Anion gap: 19 — ABNORMAL HIGH (ref 5–15)
Anion gap: 18 — ABNORMAL HIGH (ref 5–15)
BUN: 74 mg/dL — ABNORMAL HIGH (ref 8–23)
BUN: 80 mg/dL — ABNORMAL HIGH (ref 8–23)
CO2: 20 mmol/L — ABNORMAL LOW (ref 22–32)
CO2: 21 mmol/L — ABNORMAL LOW (ref 22–32)
Calcium: 10 mg/dL (ref 8.9–10.3)
Calcium: 10 mg/dL (ref 8.9–10.3)
Chloride: 100 mmol/L (ref 98–111)
Chloride: 98 mmol/L (ref 98–111)
Creatinine, Ser: 9.36 mg/dL — ABNORMAL HIGH (ref 0.44–1.00)
Creatinine, Ser: 9.5 mg/dL — ABNORMAL HIGH (ref 0.44–1.00)
GFR calc Af Amer: 5 mL/min — ABNORMAL LOW (ref >60)
GFR calc Af Amer: 5 mL/min — ABNORMAL LOW (ref >60)
GFR calc non Af Amer: 4 mL/min — ABNORMAL LOW (ref >60)
GFR calc non Af Amer: 4 mL/min — ABNORMAL LOW (ref >60)
Glucose, Bld: 56 mg/dL — ABNORMAL LOW (ref 70–99)
Glucose, Bld: 139 mg/dL — ABNORMAL HIGH (ref 70–99)
Phosphorus: 5 mg/dL — ABNORMAL HIGH (ref 2.5–4.6)
Phosphorus: 4.5 mg/dL (ref 2.5–4.6)
Potassium: 4.3 mmol/L (ref 3.5–5.1)
Potassium: 3.8 mmol/L (ref 3.5–5.1)
Sodium: 139 mmol/L (ref 135–145)
Sodium: 137 mmol/L (ref 135–145)

## 2018-10-27 LAB — HIV ANTIBODY (ROUTINE TESTING W REFLEX): HIV Screen 4th Generation wRfx: NONREACTIVE

## 2018-10-27 LAB — GLUCOSE, CAPILLARY
Glucose-Capillary: 71 mg/dL (ref 70–99)
Glucose-Capillary: 64 mg/dL — ABNORMAL LOW (ref 70–99)
Glucose-Capillary: 115 mg/dL — ABNORMAL HIGH (ref 70–99)
Glucose-Capillary: 74 mg/dL (ref 70–99)

## 2018-10-27 LAB — URINE CULTURE

## 2018-10-27 LAB — MRSA PCR SCREENING: MRSA by PCR: NEGATIVE

## 2018-10-27 MED ORDER — HEPARIN SODIUM (PORCINE) 1000 UNIT/ML DIALYSIS: 1000.0000 [IU] | INTRAMUSCULAR | Status: DC | PRN

## 2018-10-27 MED ORDER — PENTAFLUOROPROP-TETRAFLUOROETH EX AERO: 1.0000 "application " | INHALATION_SPRAY | CUTANEOUS | Status: DC | PRN

## 2018-10-27 MED ORDER — LIDOCAINE-PRILOCAINE 2.5-2.5 % EX CREA: 1.0000 "application " | TOPICAL_CREAM | CUTANEOUS | Status: DC | PRN

## 2018-10-27 MED ORDER — INSULIN GLARGINE 100 UNIT/ML ~~LOC~~ SOLN: 10.0000 [IU] | Freq: Every day | SUBCUTANEOUS | 0 refills | Status: DC

## 2018-10-27 MED ORDER — ALTEPLASE 2 MG IJ SOLR
2.0000 mg | Freq: Once | INTRAMUSCULAR | Status: DC | PRN
  Filled 2018-10-27: qty 2

## 2018-10-27 MED ORDER — SODIUM CHLORIDE 0.9 % IV SOLN: 100.0000 mL | INTRAVENOUS | Status: DC | PRN

## 2018-10-27 MED ORDER — CHLORHEXIDINE GLUCONATE CLOTH 2 % EX PADS: 6.0000 | MEDICATED_PAD | Freq: Every day | CUTANEOUS | Status: DC

## 2018-10-27 MED ORDER — LIDOCAINE HCL (PF) 1 % IJ SOLN: 5.0000 mL | INTRAMUSCULAR | Status: DC | PRN

## 2018-10-27 NOTE — Discharge Instructions (Signed)
Return to ER for fever/any change in status

## 2018-10-27 NOTE — Consult Note (Signed)
Reason for Consult: ESRD Referring Physician: Dr. Jonelle Sidle  Chief Complaint:  Weakness and hypotension  Home HD using NxStage with PureFlow 4X Week, Sun, Mon, Wed and Thur, CAR 170, Therapy Fluid 2.0 K 45 Lactate, Volume per Tx 30 (liters), Flow Fraction 45 %, BFR 400, EDW 75 (kg), Access: AVFistula-Standard, 2K bath, lt BCF   Assessment/Plan: 1. ESRD - Will continue outpt regimen of Sun, Mon, Wed and Thur if she's still here. She prefers to go home to dialyze and feels well but we should dialyze her at least one time here to ensure the symptoms don't appear again. She became symptomatic during treatment and her spouse had to cut short her treatment. Will plan on a 3hr treatment. Increase EDW to 75.5 kg for now. 2. Leukocytosis - unclear etiology. No clear source of infection. 3. Renal osteodystrophy - cont home regimen for now 4. Anemia - @ goal. 5. Hypotension - secondary to sepsis vs volume depletion (new EDW?) - should be volume resuscitated by now. 6. DM   HPI: Kimberly [name] is an 63 y.o. 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 Moshe Cipro. She Uses the NxStage at home and dialyzes Sun, Mon, Wed and Marceline with EDW ~75kg, last HD Monday. She came to the ED because of hypotension SBP in the 80's, lower back pain but denied myalgias, cough, sore throat, dyspnea or sick contacts. She was found to be hypotensive in the ED and given 1L of IVF and a WBC of 25K. She didn't have any further fever or chills. She does still have a little urine; urinalysis did not show significant WBC's but there was many bacteria. She denies dysuria or hematuria;  CXR did not show any PNA but only RLL atelectasis or linear scarring. She also denies abd pain, diarrhea, CP, dizziness.    ROS Pertinent items are noted in HPI.  Chemistry and CBC: Creatinine, Ser  Date/Time Value Ref Range Status  10/27/2018 04:38 AM 9.36 (H) 0.44 - 1.00 mg/dL Final  10/26/2018 10:00 PM 8.97 (H) 0.44 - 1.00  mg/dL Final  10/26/2018 05:04 PM 8.43 (H) 0.44 - 1.00 mg/dL Final  07/31/2017 10:45 AM 4.60 (H) 0.44 - 1.00 mg/dL Final  04/23/2017 08:38 AM 4.58 (H) 0.44 - 1.00 mg/dL Final  07/04/2016 04:06 AM 2.28 (H) 0.44 - 1.00 mg/dL Final  07/01/2016 12:04 PM 2.43 (H) 0.44 - 1.00 mg/dL Final  06/25/2016 05:49 AM 2.69 (H) 0.44 - 1.00 mg/dL Final  06/24/2016 09:56 AM 3.15 (H) 0.44 - 1.00 mg/dL Final  04/14/2016 12:10 PM 2.45 (H) 0.44 - 1.00 mg/dL Final  01/07/2013 04:30 AM 0.98 0.50 - 1.10 mg/dL Final  01/06/2013 11:30 AM 1.44 (H) 0.50 - 1.10 mg/dL Final   Recent Labs  Lab 10/26/18 1704 10/26/18 2200 10/27/18 0438  NA 136 136 139  K 4.9 4.5 4.3  CL 97* 96* 100  CO2 24 28 20*  GLUCOSE 134* 109* 56*  BUN 58* 66* 74*  CREATININE 8.43* 8.97* 9.36*  CALCIUM 9.9 9.7 10.0  PHOS  --   --  5.0*   Recent Labs  Lab 10/26/18 1730 10/26/18 1843 10/26/18 2200  WBC 25.3* 26.6* 25.9*  NEUTROABS 20.3* 23.4*  --   HGB 12.1 12.2 11.0*  HCT 37.1 37.6 33.2*  MCV 98.7 98.9 97.1  PLT 123* 117* 111*   Liver Function Tests: Recent Labs  Lab 10/26/18 1704 10/26/18 2200 10/27/18 0438  AST 76* 62*  --   ALT 62* 54*  --  ALKPHOS 116 108  --   BILITOT 1.1 1.3*  --   PROT 6.2* 5.9*  --   ALBUMIN 3.2* 3.2* 3.0*   No results for input(s): LIPASE, AMYLASE in the last 168 hours. No results for input(s): AMMONIA in the last 168 hours. Cardiac Enzymes: Recent Labs  Lab 10/26/18 2010  TROPONINI 0.09*   Iron Studies: No results for input(s): IRON, TIBC, TRANSFERRIN, FERRITIN in the last 72 hours. PT/INR: @LABRCNTIP (inr:5)  Xrays/Other Studies: ) Results for orders placed or performed during the hospital encounter of 10/26/18 (from the past 48 hour(s))  Urinalysis, Routine w reflex microscopic     Status: Abnormal   Collection Time: 10/26/18  2:43 PM  Result Value Ref Range   Color, Urine YELLOW YELLOW   APPearance CLOUDY (A) CLEAR   Specific Gravity, Urine 1.025 1.005 - 1.030   pH 5.0 5.0 - 8.0    Glucose, UA NEGATIVE NEGATIVE mg/dL   Hgb urine dipstick NEGATIVE NEGATIVE   Bilirubin Urine SMALL (A) NEGATIVE   Ketones, ur 15 (A) NEGATIVE mg/dL   Protein, ur >300 (A) NEGATIVE mg/dL   Nitrite NEGATIVE NEGATIVE   Leukocytes,Ua TRACE (A) NEGATIVE    Comment: Performed at [facility] [address], [place], [place] [address]  Urinalysis, Microscopic (reflex)     Status: Abnormal   Collection Time: 10/26/18  2:43 PM  Result Value Ref Range   RBC / HPF 0-5 0 - 5 RBC/hpf   WBC, UA 0-5 0 - 5 WBC/hpf   Bacteria, UA MANY (A) NONE SEEN   Squamous Epithelial / LPF >50 0 - 5   Urine-Other LESS THAN 10 mL OF URINE SUBMITTED     Comment: MICROSCOPIC EXAM PERFORMED ON UNCONCENTRATED URINE Performed at [facility], [place] [address], [place], [place] [address]   Comprehensive metabolic panel     Status: Abnormal   Collection Time: 10/26/18  5:04 PM  Result Value Ref Range   Sodium 136 135 - 145 mmol/L   Potassium 4.9 3.5 - 5.1 mmol/L   Chloride 97 (L) 98 - 111 mmol/L   CO2 24 22 - 32 mmol/L   Glucose, Bld 134 (H) 70 - 99 mg/dL   BUN 58 (H) 8 - 23 mg/dL   Creatinine, Ser 8.43 (H) 0.44 - 1.00 mg/dL   Calcium 9.9 8.9 - 10.3 mg/dL   Total Protein 6.2 (L) 6.5 - 8.1 g/dL   Albumin 3.2 (L) 3.5 - 5.0 g/dL   AST 76 (H) 15 - 41 U/L   ALT 62 (H) 0 - 44 U/L   Alkaline Phosphatase 116 38 - 126 U/L   Total Bilirubin 1.1 0.3 - 1.2 mg/dL   GFR calc non Af Amer 5 (L) >60 mL/min   GFR calc Af Amer 5 (L) >60 mL/min   Anion gap 15 5 - 15    Comment: Performed at [facility], [place] [address], [place], [place] [address]  Lactic acid, plasma     Status: Abnormal   Collection Time: 10/26/18  5:30 PM  Result Value Ref Range   Lactic Acid, Venous 2.4 (HH) 0.5 - 1.9 mmol/L    Comment: CRITICAL RESULT CALLED TO, READ BACK BY AND VERIFIED WITH: FOUNTAIN,M RN @ [phone] 10/26/18 LEONARD,A Performed at [facility], [place] [address] [place], [place] [address]   CBC with  Differential/Platelet     Status: Abnormal   Collection Time: 10/26/18  5:30 PM  Result Value Ref Range  WBC 25.3 (H) 4.0 - 10.5 K/uL   RBC 3.76 (L) 3.87 - 5.11 MIL/uL   Hemoglobin 12.1 12.0 - 15.0 g/dL   HCT 37.1 36.0 - 46.0 %   MCV 98.7 80.0 - 100.0 fL   MCH 32.2 26.0 - 34.0 pg   MCHC 32.6 30.0 - 36.0 g/dL   RDW 14.4 11.5 - 15.5 %   Platelets 123 (L) 150 - 400 K/uL   nRBC 0.0 0.0 - 0.2 %   Neutrophils Relative % 80 %   Neutro Abs 20.3 (H) 1.7 - 7.7 K/uL   Lymphocytes Relative 4 %   Lymphs Abs 0.9 0.7 - 4.0 K/uL   Monocytes Relative 8 %   Monocytes Absolute 1.9 (H) 0.1 - 1.0 K/uL   Eosinophils Relative 0 %   Eosinophils Absolute 0.0 0.0 - 0.5 K/uL   Basophils Relative 0 %   Basophils Absolute 0.1 0.0 - 0.1 K/uL   WBC Morphology INCREASED BANDS (>20% BANDS)     Comment: MILD LEFT SHIFT (1-5% METAS, OCC MYELO, OCC BANDS)   Immature Granulocytes 8 %   Abs Immature Granulocytes 2.08 (H) 0.00 - 0.07 K/uL    Comment: Performed at [facility], [address], [place], [place] [address]  Lactic acid, plasma     Status: Abnormal   Collection Time: 10/26/18  6:43 PM  Result Value Ref Range   Lactic Acid, Venous 2.0 (HH) 0.5 - 1.9 mmol/L    Comment: CRITICAL RESULT CALLED TO, READ BACK BY AND VERIFIED WITH: A GOSLMAN,RN 1933 10/26/2018 WBOND Performed at [facility], [place] [address] [place], [place] [address]   Blood Culture (routine x 2)     Status: None (Preliminary result)   Collection Time: 10/26/18  6:43 PM  Result Value Ref Range   Specimen Description BLOOD RIGHT WRIST    Special Requests      BOTTLES DRAWN AEROBIC AND ANAEROBIC Blood Culture results may not be optimal due to an inadequate volume of blood received in culture bottles   Culture      NO GROWTH < 12 HOURS Performed at [facility] [address], [place], [place] [address]    Report Status PENDING   Blood Culture (routine x 2)     Status: None (Preliminary result)   Collection Time:  10/26/18  6:43 PM  Result Value Ref Range   Specimen Description BLOOD RIGHT HAND    Special Requests      BOTTLES DRAWN AEROBIC AND ANAEROBIC Blood Culture results may not be optimal due to an inadequate volume of blood received in culture bottles   Culture      NO GROWTH < 12 HOURS Performed at [facility] [address] [place], [place] [address]    Report Status PENDING   CBC WITH DIFFERENTIAL     Status: Abnormal   Collection Time: 10/26/18  6:43 PM  Result Value Ref Range   WBC 26.6 (H) 4.0 - 10.5 K/uL    Comment: REPEATED TO VERIFY WHITE COUNT CONFIRMED ON SMEAR    RBC 3.80 (L) 3.87 - 5.11 MIL/uL   Hemoglobin 12.2 12.0 - 15.0 g/dL   HCT 37.6 36.0 - 46.0 %   MCV 98.9 80.0 - 100.0 fL   MCH 32.1 26.0 - 34.0 pg   MCHC 32.4 30.0 - 36.0 g/dL   RDW 14.4 11.5 - 15.5 %   Platelets 117 (L) 150 - 400 K/uL    Comment: REPEATED TO  VERIFY PLATELET COUNT CONFIRMED BY SMEAR    nRBC 0.0 0.0 - 0.2 %   Neutrophils Relative % 88 %   Neutro Abs 23.4 (H) 1.7 - 7.7 K/uL   Lymphocytes Relative 6 %   Lymphs Abs 1.6 0.7 - 4.0 K/uL   Monocytes Relative 3 %   Monocytes Absolute 0.8 0.1 - 1.0 K/uL   Eosinophils Relative 0 %   Eosinophils Absolute 0.0 0.0 - 0.5 K/uL   Basophils Relative 1 %   Basophils Absolute 0.3 (H) 0.0 - 0.1 K/uL   nRBC 0 0 /100 WBC   Promyelocytes Relative 2 %   Abs Immature Granulocytes 0.50 (H) 0.00 - 0.07 K/uL    Comment: Performed at [facility] [address], [place], [place] [address]  SARS Coronavirus 2 (CEPHEID - Performed in [facility] hospital lab), Hosp Order     Status: None   Collection Time: 10/26/18  7:15 PM  Result Value Ref Range   SARS Coronavirus 2 NEGATIVE NEGATIVE    Comment: (NOTE) If result is NEGATIVE SARS-CoV-2 target nucleic acids are NOT DETECTED. The SARS-CoV-2 RNA is generally detectable in upper and lower  respiratory specimens during the acute phase of infection. The lowest  concentration of SARS-CoV-2 viral copies  this assay can detect is 250  copies / mL. A negative result does not preclude SARS-CoV-2 infection  and should not be used as the sole basis for treatment or other  patient management decisions.  A negative result may occur with  improper specimen collection / handling, submission of specimen other  than nasopharyngeal swab, presence of viral mutation(s) within the  areas targeted by this assay, and inadequate number of viral copies  (<250 copies / mL). A negative result must be combined with clinical  observations, patient history, and epidemiological information. If result is POSITIVE SARS-CoV-2 target nucleic acids are DETECTED. The SARS-CoV-2 RNA is generally detectable in upper and lower  respiratory specimens dur ing the acute phase of infection.  Positive  results are indicative of active infection with SARS-CoV-2.  Clinical  correlation with patient history and other diagnostic information is  necessary to determine patient infection status.  Positive results do  not rule out bacterial infection or co-infection with other viruses. If result is PRESUMPTIVE POSTIVE SARS-CoV-2 nucleic acids MAY BE PRESENT.   A presumptive positive result was obtained on the submitted specimen  and confirmed on repeat testing.  While 2019 novel coronavirus  (SARS-CoV-2) nucleic acids may be present in the submitted sample  additional confirmatory testing may be necessary for epidemiological  and / or clinical management purposes  to differentiate between  SARS-CoV-2 and other Sarbecovirus currently known to infect humans.  If clinically indicated additional testing with an alternate test  methodology [id number]) is advised. The SARS-CoV-2 RNA is generally  detectable in upper and lower respiratory sp ecimens during the acute  phase of infection. The expected result is Negative. Fact Sheet for Patients:  StrictlyIdeas.no Fact Sheet for Healthcare  Providers: BankingDealers.co.za This test is not yet approved or cleared by the [place] FDA and has been authorized for detection and/or diagnosis of SARS-CoV-2 by FDA under an Emergency Use Authorization (EUA).  This EUA will remain in effect (meaning this test can be used) for the duration of the COVID-19 declaration under Section 564(b)(1) of the Act, 21 U.S.C. section 360bbb-3(b)(1), unless the authorization is terminated or revoked sooner. Performed at [facility], [place] [address], [place], [place] [address]  Troponin I - ONCE - STAT     Status: Abnormal   Collection Time: 10/26/18  8:10 PM  Result Value Ref Range   Troponin I 0.09 (HH) <0.03 ng/mL    Comment: CRITICAL RESULT CALLED TO, READ BACK BY AND VERIFIED WITH: RN J WOODY @ 2133 10/26/18 BY S GEZAHEGN Performed at [facility], [place] [address] [place], [place] [address]   Glucose, capillary     Status: Abnormal   Collection Time: 10/26/18  9:48 PM  Result Value Ref Range   Glucose-Capillary 105 (H) 70 - 99 mg/dL  Comprehensive metabolic panel     Status: Abnormal   Collection Time: 10/26/18 10:00 PM  Result Value Ref Range   Sodium 136 135 - 145 mmol/L   Potassium 4.5 3.5 - 5.1 mmol/L   Chloride 96 (L) 98 - 111 mmol/L   CO2 28 22 - 32 mmol/L   Glucose, Bld 109 (H) 70 - 99 mg/dL   BUN 66 (H) 8 - 23 mg/dL   Creatinine, Ser 8.97 (H) 0.44 - 1.00 mg/dL   Calcium 9.7 8.9 - 10.3 mg/dL   Total Protein 5.9 (L) 6.5 - 8.1 g/dL   Albumin 3.2 (L) 3.5 - 5.0 g/dL   AST 62 (H) 15 - 41 U/L   ALT 54 (H) 0 - 44 U/L   Alkaline Phosphatase 108 38 - 126 U/L   Total Bilirubin 1.3 (H) 0.3 - 1.2 mg/dL   GFR calc non Af Amer 4 (L) >60 mL/min   GFR calc Af Amer 5 (L) >60 mL/min   Anion gap 12 5 - 15    Comment: Performed at [facility], [place] [address] [place], [place] [address]  CBC     Status: Abnormal   Collection Time: 10/26/18 10:00 PM  Result Value Ref Range   WBC 25.9 (H) 4.0 - 10.5  K/uL   RBC 3.42 (L) 3.87 - 5.11 MIL/uL   Hemoglobin 11.0 (L) 12.0 - 15.0 g/dL   HCT 33.2 (L) 36.0 - 46.0 %   MCV 97.1 80.0 - 100.0 fL   MCH 32.2 26.0 - 34.0 pg   MCHC 33.1 30.0 - 36.0 g/dL   RDW 14.4 11.5 - 15.5 %   Platelets 111 (L) 150 - 400 K/uL    Comment: REPEATED TO VERIFY SPECIMEN CHECKED FOR CLOTS Immature Platelet Fraction may be clinically indicated, consider ordering this additional test [id number] CONSISTENT WITH PREVIOUS RESULT    nRBC 0.0 0.0 - 0.2 %    Comment: Performed at [facility], [place] [address], [place], [place] [address]  Renal function panel     Status: Abnormal   Collection Time: 10/27/18  4:38 AM  Result Value Ref Range   Sodium 139 135 - 145 mmol/L   Potassium 4.3 3.5 - 5.1 mmol/L   Chloride 100 98 - 111 mmol/L   CO2 20 (L) 22 - 32 mmol/L   Glucose, Bld 56 (L) 70 - 99 mg/dL   BUN 74 (H) 8 - 23 mg/dL   Creatinine, Ser 9.36 (H) 0.44 - 1.00 mg/dL   Calcium 10.0 8.9 - 10.3 mg/dL   Phosphorus 5.0 (H) 2.5 - 4.6 mg/dL   Albumin 3.0 (L) 3.5 - 5.0 g/dL   GFR calc non Af Amer 4 (L) >60 mL/min   GFR calc Af Amer 5 (L) >60 mL/min   Anion gap 19 (H) 5 - 15    Comment: Performed at [facility]  [address] [place], [place] [address]  Glucose, capillary     Status: None   Collection Time: 10/27/18  6:56 AM  Result Value Ref Range   Glucose-Capillary 71 70 - 99 mg/dL   Dg Chest 2 View  Result Date: 10/26/2018 CLINICAL DATA:  Dizziness/low blood pressure x yesterday after dialysis, HTN, diabetic, past smoker - quit 20+ years ago Tech wore surgical mask/gloves, pt wore mask EXAM: CHEST - 2 VIEW COMPARISON:  06/24/2016 FINDINGS: Linear scarring or subsegmental atelectasis at the right lung base. Left lung clear. No overt edema. Heart size upper limits normal. Aortic Atherosclerosis (ICD10-170.0). No effusion. Visualized bones unremarkable. IMPRESSION: Right lower lobe linear scarring or atelectasis. Electronically Signed   By: Lucrezia Europe M.D.   On:  10/26/2018 15:15    PMH:   Past Medical History:  Diagnosis Date  . Adenomatous colon polyp 08/2007  . Anemia   . Diabetes mellitus type 2, controlled ([place] [facility])    type 2  . Dialysis patient ([facility]) 01/2017   M-W-F  . End stage renal disease ([place])     diaylis sunday tuesdays and thurs does at home  . Goiter   . Gout   . Hyperlipidemia   . Hypertension   . Hyperthyroidism   . Kidney disease   . Kidney transplant failure    8 years ago  . Osteopenia   . Pneumonia    2018  . Seasonal allergies   . Vitamin B12 deficiency anemia due to intrinsic factor deficiency     PSH:   Past Surgical History:  Procedure Laterality Date  . AV FISTULA PLACEMENT Left 2008  . colonscopy  02/2016  . GANGLION CYST EXCISION Right 1994  . KIDNEY TRANSPLANT  June 2010  . MINOR FULGERATION OF ANAL CONDYLOMA N/A 07/31/2017   Procedure: EXCISION  FULGERATION OF ANAL CONDYLOMA;  Surgeon: Ileana Roup, MD;  Location: [facility];  Service: General;  Laterality: N/A;  . RECTAL EXAM UNDER ANESTHESIA N/A 07/03/2016   Procedure: RECTAL EXAM UNDER ANESTHESIA;  Surgeon: Greer Pickerel, MD;  Location: [facility];  Service: General;  Laterality: N/A;  . SHOULDER ARTHROSCOPY W/ ROTATOR CUFF REPAIR Right yrs ago  . UPPER GI ENDOSCOPY  sept   2017  . WART FULGURATION N/A 07/03/2016   Procedure: EXCISION FULGURATION ANAL WART EXCISIONAL BIOSY OF EXTERNAL HEMORROID;  Surgeon: Greer Pickerel, MD;  Location: [facility];  Service: General;  Laterality: N/A;    Allergies: No Known Allergies  Medications:   Prior to Admission medications   Medication Sig Start Date End Date Taking? Authorizing Provider  ACCU-CHEK SMARTVIEW test strip 1 each by Other route 2 (two) times daily. Use twice daily as directed 11/21/15   [provider]  acetaminophen (TYLENOL) 500 MG tablet Take 500-1,000 mg by mouth every 8 (eight) hours as needed for mild pain or headache (depends on pain if takes 1-2 tablets).    [provider]   amLODipine (NORVASC) 10 MG tablet Take 10 mg by mouth every evening.     [provider]  B-D ULTRAFINE III SHORT PEN 31G X 8 MM MISC Use twice daily as directed 11/22/15   [provider]  docusate sodium (COLACE) 100 MG capsule Take 100 mg by mouth every other day.    [provider]  ezetimibe-simvastatin (VYTORIN) 10-20 MG per tablet Take 1 tablet by mouth at bedtime.    [provider]  ferric citrate (AURYXIA) 1 GM 210 MG(Fe) tablet Take 420 mg by mouth  3 (three) times daily with meals.    [provider]  glipiZIDE (GLUCOTROL) 10 MG tablet Take 10 mg by mouth daily.     [provider]  insulin glargine (LANTUS) 100 UNIT/ML injection Inject 0.15 mLs (15 Units total) into the skin at bedtime. Patient taking differently: Inject 10 Units into the skin at bedtime.  06/25/16 07/31/17  Dessa Phi, DO  labetalol (NORMODYNE) 200 MG tablet Take 1 tablet (200 mg total) by mouth 2 (two) times daily. Patient taking differently: Take 200-300 mg by mouth 2 (two) times daily. Takes 1.5 tablet in the morning and 1 tablet in the evening 06/25/16   Dessa Phi, DO  omeprazole (PRILOSEC) 40 MG capsule Take 40 mg by mouth daily. 02/07/16   [provider]  predniSONE (DELTASONE) 5 MG tablet Take 5 mg by mouth daily.    [provider]    Discontinued Meds:  There are no discontinued medications.  Social History:  reports that she quit smoking about 21 years ago. Her smoking use included cigarettes. She has a 20.00 pack-year smoking history. She has never used smokeless tobacco. She reports that she does not drink alcohol or use drugs.  Family History:   Family History  Problem Relation Age of Onset  . Colon cancer Sister   . Colon cancer Brother   . Heart disease Mother   . Cancer - Other Father        gum to throat    Blood pressure (!) 106/48, pulse 85, temperature 99.1 F (37.3 C), temperature source Oral, resp. rate (!) 23,  height 5\' 6"  (1.676 m), weight 77.1 kg, SpO2 (!) 74 %. General appearance: alert, cooperative and appears stated age Head: Normocephalic, without obvious abnormality, atraumatic Eyes: negative Neck: no adenopathy, no carotid bruit, no JVD, supple, symmetrical, trachea midline and thyroid not enlarged, symmetric, no tenderness/mass/nodules Back: symmetric, no curvature. ROM normal. No CVA tenderness. Resp: clear to auscultation bilaterally Chest wall: no tenderness Cardio: regular rate and rhythm, S1, S2 normal, no murmur, click, rub or gallop GI: soft, non-tender; bowel sounds normal; no masses,  no organomegaly Extremities: extremities normal, atraumatic, no cyanosis or edema Pulses: 2+ and symmetric Skin: Skin color, texture, turgor normal. No rashes or lesions Lymph nodes: Cervical, supraclavicular, and axillary nodes normal. Neurologic: Grossly normal  Access: Lt BCF + bruit       Shishir Krantz, Hunt Oris, MD 10/27/2018, 7:18 AM

## 2018-10-27 NOTE — Progress Notes (Signed)
Kimberly [name] to be discharged Home per MD order. NUrse T. discussed prescriptions and follow up appointments with the patient. Prescriptions given to patient; medication list explained in detail. Skin clean, dry and intact without evidence of skin break down, no evidence of skin tears noted. IV catheter discontinued intact. Site without signs and symptoms of complications. Dressing and pressure applied. Pt denies pain at the site currently. No complaints noted.  Patient free of lines, drains, and wounds.   An After Visit Summary (AVS) was printed and given to the patient by nurse T. Patient escorted via wheelchair, and discharged home via private auto.  Amaryllis Dyke, RN

## 2018-10-27 NOTE — Progress Notes (Signed)
[facility] PROGRESS NOTE  JESLY HARTMANN ENI:[id number] DOB: 31-Mar-1956 DOA: 10/26/2018 PCP: Marton Redwood, MD  Assessment/Plan: #1 persistent hypotension:  presumably related to dehydration secondary to over dialysis. She received 2 boluses IV fluids and SBP range now 107-123.  Nephrology evaluated and recommend home dialysis here for today to ensure symptoms do not reoccur. If BP remains stable likely ok to go home with recommendations regarding home antihypertensive -montor BP after dialysis  #2 leukocytosis: etiology unclear. No s/sx infection. Max temp 99.1. home meds include low dose prednisone. May be  secondary to dehydration and demargination.  -Monitor closely -close op follow up  #3 diabetes: fair control   -Sliding scale insulin. - Monitor patient.  #4 end-stage renal disease:  home dialysis. appreciate Nephrology assistance.  -see #1  #5 hypertension: Patient has a history of hypertension.  -See #1.  -hold home antihypertensives. .  #6 hyperlipidemia: Resume home regimen.  Continue close monitoring.  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   Objective: Vitals:   10/27/18 1300 10/27/18 1330  BP: (!) 113/55 (!) 118/57  Pulse: 78 75  Resp:    Temp:    SpO2:      Intake/Output Summary (Last 24 hours) at 10/27/2018 1401 Last data filed at 10/27/2018 0201 Gross per 24 hour  Intake 500 ml  Output -  Net 500 ml   Filed Weights   10/26/18 2100 10/27/18 0120 10/27/18 1230  Weight: 77.1 kg 77.1 kg 76.3 kg    Exam:   General:  Awake alert in no acute distress  Cardiovascular: rrr no mgr no LE edema  Respiratory: normal effort BS clear bilaterally  Abdomen: soft +BS no  guarding or rebounding  Musculoskeletal: joints without swelling/erythema   Data Reviewed: Basic Metabolic Panel: Recent Labs  Lab 10/26/18 1704 10/26/18 2200 10/27/18 0438 10/27/18 1255  NA 136 136 139 137  K 4.9 4.5 4.3 3.8  CL 97* 96* 100 98  CO2 24 28 20* 21*  GLUCOSE 134* 109* 56* 139*  BUN 58* 66* 74* 80*  CREATININE 8.43* 8.97* 9.36* 9.50*  CALCIUM 9.9 9.7 10.0 10.0  PHOS  --   --  5.0* 4.5   Liver Function Tests: Recent Labs  Lab 10/26/18 1704 10/26/18 2200 10/27/18 0438 10/27/18 1255  AST 76* 62*  --   --   ALT 62* 54*  --   --   ALKPHOS 116 108  --   --   BILITOT 1.1 1.3*  --   --   PROT 6.2* 5.9*  --   --   ALBUMIN 3.2* 3.2* 3.0* 2.0*   No results for input(s): LIPASE, AMYLASE in the last 168 hours. No results for input(s): AMMONIA in the last 168 hours. CBC: Recent Labs  Lab 10/26/18 1730 10/26/18 1843 10/26/18 2200 10/27/18 0756  WBC 25.3* 26.6* 25.9* 21.7*  NEUTROABS 20.3* 23.4*  --   --   HGB 12.1 12.2 11.0* 11.0*  HCT 37.1 37.6 33.2* 33.0*  MCV 98.7 98.9 97.1 96.2  PLT 123* 117* 111* 104*   Cardiac Enzymes: Recent Labs  Lab 10/26/18 2010  TROPONINI 0.09*   BNP (last 3 results) No results for input(s): BNP in the last 8760 hours.  ProBNP (last 3 results) No results for  input(s): PROBNP in the last 8760 hours.  CBG: Recent Labs  Lab 10/26/18 2148 10/27/18 0656 10/27/18 1120 10/27/18 1154  GLUCAP 105* 71 64* 115*    Recent Results (from the past 240 hour(s))  Blood Culture (routine x 2)     Status: None (Preliminary result)   Collection Time: 10/26/18  6:43 PM  Result Value Ref Range Status   Specimen Description BLOOD RIGHT WRIST  Final   Special Requests   Final    BOTTLES DRAWN AEROBIC AND ANAEROBIC Blood Culture results may not be optimal due to an inadequate volume of blood received in culture bottles   Culture   Final    NO GROWTH < 12 HOURS Performed at [facility] [address], [place], [place]  [address]    Report Status PENDING  Incomplete  Blood Culture (routine x 2)     Status: None (Preliminary result)   Collection Time: 10/26/18  6:43 PM  Result Value Ref Range Status   Specimen Description BLOOD RIGHT HAND  Final   Special Requests   Final    BOTTLES DRAWN AEROBIC AND ANAEROBIC Blood Culture results may not be optimal due to an inadequate volume of blood received in culture bottles   Culture   Final    NO GROWTH < 12 HOURS Performed at [facility], [place] [address] [place], [place] [address]    Report Status PENDING  Incomplete  SARS Coronavirus 2 (CEPHEID - Performed in [facility] hospital lab), Hosp Order     Status: None   Collection Time: 10/26/18  7:15 PM  Result Value Ref Range Status   SARS Coronavirus 2 NEGATIVE NEGATIVE Final    Comment: (NOTE) If result is NEGATIVE SARS-CoV-2 target nucleic acids are NOT DETECTED. The SARS-CoV-2 RNA is generally detectable in upper and lower  respiratory specimens during the acute phase of infection. The lowest  concentration of SARS-CoV-2 viral copies this assay can detect is 250  copies / mL. A negative result does not preclude SARS-CoV-2 infection  and should not be used as the sole basis for treatment or other  patient management decisions.  A negative result may occur with  improper specimen collection / handling, submission of specimen other  than nasopharyngeal swab, presence of viral mutation(s) within the  areas targeted by this assay, and inadequate number of viral copies  (<250 copies / mL). A negative result must be combined with clinical  observations, patient history, and epidemiological information. If result is POSITIVE SARS-CoV-2 target nucleic acids are DETECTED. The SARS-CoV-2 RNA is generally detectable in upper and lower  respiratory specimens dur ing the acute phase of infection.  Positive  results are indicative of active infection with SARS-CoV-2.  Clinical  correlation with patient history  and other diagnostic information is  necessary to determine patient infection status.  Positive results do  not rule out bacterial infection or co-infection with other viruses. If result is PRESUMPTIVE POSTIVE SARS-CoV-2 nucleic acids MAY BE PRESENT.   A presumptive positive result was obtained on the submitted specimen  and confirmed on repeat testing.  While 2019 novel coronavirus  (SARS-CoV-2) nucleic acids may be present in the submitted sample  additional confirmatory testing may be necessary for epidemiological  and / or clinical management purposes  to differentiate between  SARS-CoV-2 and other Sarbecovirus currently known to infect humans.  If clinically indicated additional testing with an alternate test  methodology [phone]) is advised. The SARS-CoV-2 RNA is generally  detectable in upper and lower respiratory sp ecimens during the acute  phase of infection. The expected result is Negative. Fact Sheet for Patients:  StrictlyIdeas.no Fact Sheet for Healthcare Providers: BankingDealers.co.za This test is not yet approved or cleared by the [place] FDA and has been authorized for detection and/or diagnosis of SARS-CoV-2 by FDA under an Emergency Use Authorization (EUA).  This EUA will remain in effect (meaning this test can be used) for the duration of the COVID-19 declaration under Section 564(b)(1) of the Act, 21 U.S.C. section 360bbb-3(b)(1), unless the authorization is terminated or revoked sooner. Performed at [facility], [place] [address], [place], [place] [address]   MRSA PCR Screening     Status: None   Collection Time: 10/27/18  6:00 AM  Result Value Ref Range Status   MRSA by PCR NEGATIVE NEGATIVE Final    Comment:        The GeneXpert MRSA Assay (FDA approved for NASAL specimens only), is one component of a comprehensive MRSA colonization surveillance program. It is not intended to diagnose  MRSA infection nor to guide or monitor treatment for MRSA infections. Performed at [facility], [place] [address] [place], [place] [address]      Studies: Dg Chest 2 View  Result Date: 10/26/2018 CLINICAL DATA:  Dizziness/low blood pressure x yesterday after dialysis, HTN, diabetic, past smoker - quit 20+ years ago Tech wore surgical mask/gloves, pt wore mask EXAM: CHEST - 2 VIEW COMPARISON:  06/24/2016 FINDINGS: Linear scarring or subsegmental atelectasis at the right lung base. Left lung clear. No overt edema. Heart size upper limits normal. Aortic Atherosclerosis (ICD10-170.0). No effusion. Visualized bones unremarkable. IMPRESSION: Right lower lobe linear scarring or atelectasis. Electronically Signed   By: Lucrezia Europe M.D.   On: 10/26/2018 15:15    Scheduled Meds: . Chlorhexidine Gluconate Cloth  6 each Topical Q0600  . heparin  5,000 Units Subcutaneous Q8H  . insulin aspart  0-9 Units Subcutaneous TID WC  . sodium chloride flush  3 mL Intravenous Q12H   Continuous Infusions: . sodium chloride    . sodium chloride    . sodium chloride    . vancomycin      Principal Problem:   Hypotension Active Problems:   Secondary diabetes mellitus (HCC)   HLD (hyperlipidemia)   Essential (primary) hypertension   Diabetes mellitus type 2, insulin dependent ([place])    Time spent: 47 minute    Radene Gunning, NP [facility]  If 7PM-7AM, please contact night-coverage at [email or web address], password [facility] 10/27/2018, 2:01 PM  LOS: 0 days

## 2018-10-27 NOTE — Progress Notes (Signed)
Pt. LUA AVF ulceration present Ernest Haber PA made aware orders to acess fistula above ulceration use 17G needle

## 2018-10-28 DIAGNOSIS — R252 Cramp and spasm: Secondary | ICD-10-CM | POA: Diagnosis not present

## 2018-10-28 DIAGNOSIS — I953 Hypotension of hemodialysis: Secondary | ICD-10-CM | POA: Diagnosis not present

## 2018-10-28 DIAGNOSIS — R51 Headache: Secondary | ICD-10-CM | POA: Diagnosis not present

## 2018-10-28 DIAGNOSIS — R509 Fever, unspecified: Secondary | ICD-10-CM | POA: Diagnosis not present

## 2018-10-28 DIAGNOSIS — N186 End stage renal disease: Secondary | ICD-10-CM | POA: Diagnosis not present

## 2018-10-28 DIAGNOSIS — D509 Iron deficiency anemia, unspecified: Secondary | ICD-10-CM | POA: Diagnosis not present

## 2018-10-28 NOTE — Discharge Summary (Addendum)
Physician Discharge Summary  Kimberly [name] SAY:[id number] DOB: 1956-04-10 DOA: 10/26/2018  PCP: Marton Redwood, MD  Admit date: 10/26/2018 Discharge date: 10/27/2018  Admitted From: home Discharge disposition: home   Recommendations for Outpatient Follow-Up:   1. Cbc 1 week   Discharge Diagnosis:   Principal Problem:   Hypotension Active Problems:   Secondary diabetes mellitus ([place])   HLD (hyperlipidemia)   Essential (primary) hypertension   Diabetes mellitus type 2, insulin dependent ([place])   Leukocytosis   Dehydration   ESRD on dialysis [facility])    Discharge Condition: Improved.  Diet recommendation:renal  Wound care: None.  Code status: Full.   History of Present Illness:  Kimberly [name] is a 63 y.o. 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  She dosed hemodialysis on Sunday some Mondays and then rest on Tuesdays then Wednesdays and Thursdays.  Patient's last predialysis weight was 75.2.  That was yesterday.  She came in today with significant weakness.  Systolic blood pressure was in the 80s.  Patient has received 500 cc bolus x2 of IV fluids.  Systolic initially went up to 100 and dropped back into the 80s.  She was visibly dehydrated.  Work-up in the ER however shows a white count of 25,000.  No fever or chills.  Her urinalysis is not convincing.  Chest x-ray also showed no pneumonia.  Patient however is being admitted for observation and possible sepsis but less likely.  Mainly observing blood pressure overnight. ED Course: Temperature is 98.9 blood pressure initially 84/70 currently 90/53 with pulse 95 respiratory of 33 oxygen sat 92% room air.  Sodium is 139 potassium 4.5 chloride 98 CO2 29 with glucose 92.  Creatinine 8.4  Her white count is 25.3K,,Neutrophils of 20 Lactate 2.6.  Patient is  therefore being admitted to the hospital for observation.  Suspected infection.  Based on urinalysis is cloudy urine with small leukocytes.    Hospital Course by Problem:   hypotension: presumably related to dehydration secondary to over dialysis. She received 2 boluses IV fluids and SBP range now 107-123. Nephrology evaluated and recommend home dialysis here for today to ensure symptoms do not reoccur, which they did not -may need further adjustment of BP medications as an outpatient   leukocytosis: etiology unclear. No s/sx infection.  -monitor at home -CBC 1 week -? Transient bacteremia (gram negative) -blood cultures NGTD  end-stage renal disease: home dialysis. appreciate Nephrology assistance.   hyperlipidemia:Resume home regimen. Continue close monitoring.    Medical Consultants:   renal   Discharge Exam:   Vitals:   10/27/18 1500 10/27/18 1538  BP: 108/63 129/65  Pulse: 83 79  Resp:  20  Temp:  98.7 F (37.1 C)  SpO2:  96%   Vitals:   10/27/18 1400 10/27/18 1430 10/27/18 1500 10/27/18 1538  BP: 118/62 119/63 108/63 129/65  Pulse: 77 82 83 79  Resp:    20  Temp:    98.7 F (37.1 C)  TempSrc:    Oral  SpO2:    96%  Weight:    74.3 kg  Height:        General exam: Appears calm and comfortable-- anxious to go home  The results of significant diagnostics from this hospitalization (including imaging, microbiology, ancillary and laboratory) are listed below for reference.     Procedures  and Diagnostic Studies:   Dg Chest 2 View  Result Date: 10/26/2018 CLINICAL DATA:  Dizziness/low blood pressure x yesterday after dialysis, HTN, diabetic, past smoker - quit 20+ years ago Tech wore surgical mask/gloves, pt wore mask EXAM: CHEST - 2 VIEW COMPARISON:  06/24/2016 FINDINGS: Linear scarring or subsegmental atelectasis at the right lung base. Left lung clear. No overt edema. Heart size upper limits normal. Aortic Atherosclerosis (ICD10-170.0). No effusion.  Visualized bones unremarkable. IMPRESSION: Right lower lobe linear scarring or atelectasis. Electronically Signed   By: Lucrezia Europe M.D.   On: 10/26/2018 15:15     Labs:   Basic Metabolic Panel: Recent Labs  Lab 10/26/18 1704 10/26/18 2200 10/27/18 0438 10/27/18 1255  NA 136 136 139 137  K 4.9 4.5 4.3 3.8  CL 97* 96* 100 98  CO2 24 28 20* 21*  GLUCOSE 134* 109* 56* 139*  BUN 58* 66* 74* 80*  CREATININE 8.43* 8.97* 9.36* 9.50*  CALCIUM 9.9 9.7 10.0 10.0  PHOS  --   --  5.0* 4.5   GFR Estimated Creatinine Clearance: 6.2 mL/min (A) (by C-G formula based on SCr of 9.5 mg/dL (H)). Liver Function Tests: Recent Labs  Lab 10/26/18 1704 10/26/18 2200 10/27/18 0438 10/27/18 1255  AST 76* 62*  --   --   ALT 62* 54*  --   --   ALKPHOS 116 108  --   --   BILITOT 1.1 1.3*  --   --   PROT 6.2* 5.9*  --   --   ALBUMIN 3.2* 3.2* 3.0* 2.0*   No results for input(s): LIPASE, AMYLASE in the last 168 hours. No results for input(s): AMMONIA in the last 168 hours. Coagulation profile No results for input(s): INR, PROTIME in the last 168 hours.  CBC: Recent Labs  Lab 10/26/18 1730 10/26/18 1843 10/26/18 2200 10/27/18 0756  WBC 25.3* 26.6* 25.9* 21.7*  NEUTROABS 20.3* 23.4*  --   --   HGB 12.1 12.2 11.0* 11.0*  HCT 37.1 37.6 33.2* 33.0*  MCV 98.7 98.9 97.1 96.2  PLT 123* 117* 111* 104*   Cardiac Enzymes: Recent Labs  Lab 10/26/18 2010  TROPONINI 0.09*   BNP: Invalid input(s): POCBNP CBG: Recent Labs  Lab 10/26/18 2148 10/27/18 0656 10/27/18 1120 10/27/18 1154 10/27/18 1641  GLUCAP 105* 71 64* 115* 74   D-Dimer No results for input(s): DDIMER in the last 72 hours. Hgb A1c No results for input(s): HGBA1C in the last 72 hours. Lipid Profile No results for input(s): CHOL, HDL, LDLCALC, TRIG, CHOLHDL, LDLDIRECT in the last 72 hours. Thyroid function studies No results for input(s): TSH, T4TOTAL, T3FREE, THYROIDAB in the last 72 hours.  Invalid input(s): FREET3  Anemia work up No results for input(s): VITAMINB12, FOLATE, FERRITIN, TIBC, IRON, RETICCTPCT in the last 72 hours. Microbiology Recent Results (from the past 240 hour(s))  Urine culture     Status: Abnormal   Collection Time: 10/26/18  2:43 PM  Result Value Ref Range Status   Specimen Description URINE, RANDOM  Final   Special Requests   Final    NONE Performed at [facility], [address], [place], [place] [address]    Culture MULTIPLE SPECIES PRESENT, SUGGEST RECOLLECTION (A)  Final   Report Status 10/27/2018 FINAL  Final  Blood Culture (routine x 2)     Status: None (Preliminary result)   Collection Time: 10/26/18  6:43 PM  Result Value Ref Range Status   Specimen Description BLOOD RIGHT WRIST  Final   Special Requests   Final    BOTTLES DRAWN AEROBIC AND ANAEROBIC Blood Culture results may not be optimal due to an inadequate volume of blood received in culture bottles   Culture   Final    NO GROWTH 2 DAYS Performed at [facility], [place] [address], [place], [place] [address]    Report Status PENDING  Incomplete  Blood Culture (routine x 2)     Status: None (Preliminary result)   Collection Time: 10/26/18  6:43 PM  Result Value Ref Range Status   Specimen Description BLOOD RIGHT HAND  Final   Special Requests   Final    BOTTLES DRAWN AEROBIC AND ANAEROBIC Blood Culture results may not be optimal due to an inadequate volume of blood received in culture bottles   Culture   Final    NO GROWTH 2 DAYS Performed at [facility], [place] [address], [place], [place] [address]    Report Status PENDING  Incomplete  SARS Coronavirus 2 (CEPHEID - Performed in [facility] hospital lab), Hosp Order     Status: None   Collection Time: 10/26/18  7:15 PM  Result Value Ref Range Status   SARS Coronavirus 2 NEGATIVE NEGATIVE Final    Comment: (NOTE) If result is NEGATIVE SARS-CoV-2 target nucleic acids are NOT DETECTED. The SARS-CoV-2 RNA is generally detectable in upper  and lower  respiratory specimens during the acute phase of infection. The lowest  concentration of SARS-CoV-2 viral copies this assay can detect is 250  copies / mL. A negative result does not preclude SARS-CoV-2 infection  and should not be used as the sole basis for treatment or other  patient management decisions.  A negative result may occur with  improper specimen collection / handling, submission of specimen other  than nasopharyngeal swab, presence of viral mutation(s) within the  areas targeted by this assay, and inadequate number of viral copies  (<250 copies / mL). A negative result must be combined with clinical  observations, patient history, and epidemiological information. If result is POSITIVE SARS-CoV-2 target nucleic acids are DETECTED. The SARS-CoV-2 RNA is generally detectable in upper and lower  respiratory specimens dur ing the acute phase of infection.  Positive  results are indicative of active infection with SARS-CoV-2.  Clinical  correlation with patient history and other diagnostic information is  necessary to determine patient infection status.  Positive results do  not rule out bacterial infection or co-infection with other viruses. If result is PRESUMPTIVE POSTIVE SARS-CoV-2 nucleic acids MAY BE PRESENT.   A presumptive positive result was obtained on the submitted specimen  and confirmed on repeat testing.  While 2019 novel coronavirus  (SARS-CoV-2) nucleic acids may be present in the submitted sample  additional confirmatory testing may be necessary for epidemiological  and / or clinical management purposes  to differentiate between  SARS-CoV-2 and other Sarbecovirus currently known to infect humans.  If clinically indicated additional testing with an alternate test  methodology [phone]) is advised. The SARS-CoV-2 RNA is generally  detectable in upper and lower respiratory sp ecimens during the acute  phase of infection. The expected result is  Negative. Fact Sheet for Patients:  StrictlyIdeas.no Fact Sheet for Healthcare Providers: BankingDealers.co.za This test is not yet approved or cleared by the [place] FDA and has been authorized for detection and/or diagnosis of SARS-CoV-2 by FDA under an Emergency Use Authorization (EUA).  This EUA will remain in effect (meaning this test can be  used) for the duration of the COVID-19 declaration under Section 564(b)(1) of the Act, 21 U.S.C. section 360bbb-3(b)(1), unless the authorization is terminated or revoked sooner. Performed at [facility], [place] [address] [place], [place] [address]   MRSA PCR Screening     Status: None   Collection Time: 10/27/18  6:00 AM  Result Value Ref Range Status   MRSA by PCR NEGATIVE NEGATIVE Final    Comment:        The GeneXpert MRSA Assay (FDA approved for NASAL specimens only), is one component of a comprehensive MRSA colonization surveillance program. It is not intended to diagnose MRSA infection nor to guide or monitor treatment for MRSA infections. Performed at [facility], [place] [address], [place], [place] [address]      Discharge Instructions:   Discharge Instructions    Discharge instructions   Complete by:  As directed    Renal/carb mod   Increase activity slowly   Complete by:  As directed      Allergies as of 10/27/2018   No Known Allergies     Medication List    TAKE these medications   acetaminophen 500 MG tablet Commonly known as:  TYLENOL Take 500-1,000 mg by mouth every 8 (eight) hours as needed for mild pain or headache (depends on pain if takes 1-2 tablets).   amLODipine 10 MG tablet Commonly known as:  NORVASC Take 10 mg by mouth every evening.   Auryxia 1 GM 210 MG(Fe) tablet Generic drug:  ferric citrate Take 420 mg by mouth 3 (three) times daily with meals.   B-D ULTRAFINE III SHORT PEN 31G X 8 MM Misc Generic drug:  Insulin Pen Needle  Use twice daily as directed   calcitRIOL 0.5 MCG capsule Commonly known as:  ROCALTROL Take 3 capsules by mouth daily.   docusate sodium 100 MG capsule Commonly known as:  COLACE Take 100 mg by mouth every other day.   ezetimibe-simvastatin 10-20 MG tablet Commonly known as:  VYTORIN Take 1 tablet by mouth at bedtime.   fluticasone 50 MCG/ACT nasal spray Commonly known as:  FLONASE Place 2 sprays into both nostrils daily.   glipiZIDE 10 MG tablet Commonly known as:  GLUCOTROL Take 10 mg by mouth daily.   insulin glargine 100 UNIT/ML injection Commonly known as:  LANTUS Inject 0.1 mLs (10 Units total) into the skin at bedtime for 30 days.   labetalol 200 MG tablet Commonly known as:  NORMODYNE Take 1 tablet (200 mg total) by mouth 2 (two) times daily. What changed:    how much to take  additional instructions   omeprazole 40 MG capsule Commonly known as:  PRILOSEC Take 40 mg by mouth daily.   predniSONE 5 MG tablet Commonly known as:  DELTASONE Take 5 mg by mouth daily.      Follow-up Information    Marton Redwood, MD Follow up in 1 week(s).   Specialty:  Internal Medicine Why:  for CBC Contact information: [address] [place] [address] [phone]            Time coordinating discharge: 25 min  Signed:  [facility] 10/28/2018, 3:47 PM

## 2018-10-29 DIAGNOSIS — R252 Cramp and spasm: Secondary | ICD-10-CM | POA: Diagnosis not present

## 2018-10-29 DIAGNOSIS — N186 End stage renal disease: Secondary | ICD-10-CM | POA: Diagnosis not present

## 2018-10-29 DIAGNOSIS — R509 Fever, unspecified: Secondary | ICD-10-CM | POA: Diagnosis not present

## 2018-10-29 DIAGNOSIS — I953 Hypotension of hemodialysis: Secondary | ICD-10-CM | POA: Diagnosis not present

## 2018-10-29 DIAGNOSIS — D509 Iron deficiency anemia, unspecified: Secondary | ICD-10-CM | POA: Diagnosis not present

## 2018-10-29 DIAGNOSIS — R51 Headache: Secondary | ICD-10-CM | POA: Diagnosis not present

## 2018-10-31 LAB — CULTURE, BLOOD (ROUTINE X 2)
Culture: NO GROWTH
Culture: NO GROWTH

## 2018-11-01 DIAGNOSIS — R252 Cramp and spasm: Secondary | ICD-10-CM | POA: Diagnosis not present

## 2018-11-01 DIAGNOSIS — I10 Essential (primary) hypertension: Secondary | ICD-10-CM | POA: Diagnosis not present

## 2018-11-01 DIAGNOSIS — D72829 Elevated white blood cell count, unspecified: Secondary | ICD-10-CM | POA: Diagnosis not present

## 2018-11-01 DIAGNOSIS — D509 Iron deficiency anemia, unspecified: Secondary | ICD-10-CM | POA: Diagnosis not present

## 2018-11-01 DIAGNOSIS — I953 Hypotension of hemodialysis: Secondary | ICD-10-CM | POA: Diagnosis not present

## 2018-11-01 DIAGNOSIS — E860 Dehydration: Secondary | ICD-10-CM | POA: Diagnosis not present

## 2018-11-01 DIAGNOSIS — I959 Hypotension, unspecified: Secondary | ICD-10-CM | POA: Diagnosis not present

## 2018-11-01 DIAGNOSIS — R509 Fever, unspecified: Secondary | ICD-10-CM | POA: Diagnosis not present

## 2018-11-01 DIAGNOSIS — R51 Headache: Secondary | ICD-10-CM | POA: Diagnosis not present

## 2018-11-01 DIAGNOSIS — N186 End stage renal disease: Secondary | ICD-10-CM | POA: Diagnosis not present

## 2018-11-01 DIAGNOSIS — E1129 Type 2 diabetes mellitus with other diabetic kidney complication: Secondary | ICD-10-CM | POA: Diagnosis not present

## 2018-11-02 DIAGNOSIS — D72829 Elevated white blood cell count, unspecified: Secondary | ICD-10-CM | POA: Diagnosis not present

## 2018-11-02 DIAGNOSIS — E1129 Type 2 diabetes mellitus with other diabetic kidney complication: Secondary | ICD-10-CM | POA: Diagnosis not present

## 2018-11-03 DIAGNOSIS — R252 Cramp and spasm: Secondary | ICD-10-CM | POA: Diagnosis not present

## 2018-11-03 DIAGNOSIS — I953 Hypotension of hemodialysis: Secondary | ICD-10-CM | POA: Diagnosis not present

## 2018-11-03 DIAGNOSIS — R51 Headache: Secondary | ICD-10-CM | POA: Diagnosis not present

## 2018-11-03 DIAGNOSIS — R509 Fever, unspecified: Secondary | ICD-10-CM | POA: Diagnosis not present

## 2018-11-03 DIAGNOSIS — D509 Iron deficiency anemia, unspecified: Secondary | ICD-10-CM | POA: Diagnosis not present

## 2018-11-03 DIAGNOSIS — N186 End stage renal disease: Secondary | ICD-10-CM | POA: Diagnosis not present

## 2018-11-04 DIAGNOSIS — I953 Hypotension of hemodialysis: Secondary | ICD-10-CM | POA: Diagnosis not present

## 2018-11-04 DIAGNOSIS — R509 Fever, unspecified: Secondary | ICD-10-CM | POA: Diagnosis not present

## 2018-11-04 DIAGNOSIS — R252 Cramp and spasm: Secondary | ICD-10-CM | POA: Diagnosis not present

## 2018-11-04 DIAGNOSIS — N186 End stage renal disease: Secondary | ICD-10-CM | POA: Diagnosis not present

## 2018-11-04 DIAGNOSIS — D509 Iron deficiency anemia, unspecified: Secondary | ICD-10-CM | POA: Diagnosis not present

## 2018-11-04 DIAGNOSIS — R51 Headache: Secondary | ICD-10-CM | POA: Diagnosis not present

## 2018-11-05 DIAGNOSIS — R252 Cramp and spasm: Secondary | ICD-10-CM | POA: Diagnosis not present

## 2018-11-05 DIAGNOSIS — I953 Hypotension of hemodialysis: Secondary | ICD-10-CM | POA: Diagnosis not present

## 2018-11-05 DIAGNOSIS — D509 Iron deficiency anemia, unspecified: Secondary | ICD-10-CM | POA: Diagnosis not present

## 2018-11-05 DIAGNOSIS — R51 Headache: Secondary | ICD-10-CM | POA: Diagnosis not present

## 2018-11-05 DIAGNOSIS — N186 End stage renal disease: Secondary | ICD-10-CM | POA: Diagnosis not present

## 2018-11-05 DIAGNOSIS — R509 Fever, unspecified: Secondary | ICD-10-CM | POA: Diagnosis not present

## 2018-11-08 DIAGNOSIS — D509 Iron deficiency anemia, unspecified: Secondary | ICD-10-CM | POA: Diagnosis not present

## 2018-11-08 DIAGNOSIS — N186 End stage renal disease: Secondary | ICD-10-CM | POA: Diagnosis not present

## 2018-11-08 DIAGNOSIS — I953 Hypotension of hemodialysis: Secondary | ICD-10-CM | POA: Diagnosis not present

## 2018-11-08 DIAGNOSIS — R509 Fever, unspecified: Secondary | ICD-10-CM | POA: Diagnosis not present

## 2018-11-08 DIAGNOSIS — R51 Headache: Secondary | ICD-10-CM | POA: Diagnosis not present

## 2018-11-08 DIAGNOSIS — R252 Cramp and spasm: Secondary | ICD-10-CM | POA: Diagnosis not present

## 2018-11-10 DIAGNOSIS — R509 Fever, unspecified: Secondary | ICD-10-CM | POA: Diagnosis not present

## 2018-11-10 DIAGNOSIS — I953 Hypotension of hemodialysis: Secondary | ICD-10-CM | POA: Diagnosis not present

## 2018-11-10 DIAGNOSIS — R252 Cramp and spasm: Secondary | ICD-10-CM | POA: Diagnosis not present

## 2018-11-10 DIAGNOSIS — N186 End stage renal disease: Secondary | ICD-10-CM | POA: Diagnosis not present

## 2018-11-10 DIAGNOSIS — D509 Iron deficiency anemia, unspecified: Secondary | ICD-10-CM | POA: Diagnosis not present

## 2018-11-10 DIAGNOSIS — R51 Headache: Secondary | ICD-10-CM | POA: Diagnosis not present

## 2018-11-11 DIAGNOSIS — I953 Hypotension of hemodialysis: Secondary | ICD-10-CM | POA: Diagnosis not present

## 2018-11-11 DIAGNOSIS — D509 Iron deficiency anemia, unspecified: Secondary | ICD-10-CM | POA: Diagnosis not present

## 2018-11-11 DIAGNOSIS — R509 Fever, unspecified: Secondary | ICD-10-CM | POA: Diagnosis not present

## 2018-11-11 DIAGNOSIS — R51 Headache: Secondary | ICD-10-CM | POA: Diagnosis not present

## 2018-11-11 DIAGNOSIS — R252 Cramp and spasm: Secondary | ICD-10-CM | POA: Diagnosis not present

## 2018-11-11 DIAGNOSIS — N186 End stage renal disease: Secondary | ICD-10-CM | POA: Diagnosis not present

## 2018-11-12 DIAGNOSIS — R51 Headache: Secondary | ICD-10-CM | POA: Diagnosis not present

## 2018-11-12 DIAGNOSIS — R252 Cramp and spasm: Secondary | ICD-10-CM | POA: Diagnosis not present

## 2018-11-12 DIAGNOSIS — D509 Iron deficiency anemia, unspecified: Secondary | ICD-10-CM | POA: Diagnosis not present

## 2018-11-12 DIAGNOSIS — N186 End stage renal disease: Secondary | ICD-10-CM | POA: Diagnosis not present

## 2018-11-12 DIAGNOSIS — I953 Hypotension of hemodialysis: Secondary | ICD-10-CM | POA: Diagnosis not present

## 2018-11-12 DIAGNOSIS — R509 Fever, unspecified: Secondary | ICD-10-CM | POA: Diagnosis not present

## 2018-11-15 DIAGNOSIS — I953 Hypotension of hemodialysis: Secondary | ICD-10-CM | POA: Diagnosis not present

## 2018-11-15 DIAGNOSIS — N186 End stage renal disease: Secondary | ICD-10-CM | POA: Diagnosis not present

## 2018-11-15 DIAGNOSIS — R51 Headache: Secondary | ICD-10-CM | POA: Diagnosis not present

## 2018-11-15 DIAGNOSIS — R252 Cramp and spasm: Secondary | ICD-10-CM | POA: Diagnosis not present

## 2018-11-15 DIAGNOSIS — D509 Iron deficiency anemia, unspecified: Secondary | ICD-10-CM | POA: Diagnosis not present

## 2018-11-15 DIAGNOSIS — R509 Fever, unspecified: Secondary | ICD-10-CM | POA: Diagnosis not present

## 2018-11-17 DIAGNOSIS — R51 Headache: Secondary | ICD-10-CM | POA: Diagnosis not present

## 2018-11-17 DIAGNOSIS — R252 Cramp and spasm: Secondary | ICD-10-CM | POA: Diagnosis not present

## 2018-11-17 DIAGNOSIS — N186 End stage renal disease: Secondary | ICD-10-CM | POA: Diagnosis not present

## 2018-11-17 DIAGNOSIS — D509 Iron deficiency anemia, unspecified: Secondary | ICD-10-CM | POA: Diagnosis not present

## 2018-11-17 DIAGNOSIS — R509 Fever, unspecified: Secondary | ICD-10-CM | POA: Diagnosis not present

## 2018-11-17 DIAGNOSIS — I953 Hypotension of hemodialysis: Secondary | ICD-10-CM | POA: Diagnosis not present

## 2018-11-18 DIAGNOSIS — R51 Headache: Secondary | ICD-10-CM | POA: Diagnosis not present

## 2018-11-18 DIAGNOSIS — M109 Gout, unspecified: Secondary | ICD-10-CM | POA: Diagnosis not present

## 2018-11-18 DIAGNOSIS — N186 End stage renal disease: Secondary | ICD-10-CM | POA: Diagnosis not present

## 2018-11-18 DIAGNOSIS — R509 Fever, unspecified: Secondary | ICD-10-CM | POA: Diagnosis not present

## 2018-11-18 DIAGNOSIS — I953 Hypotension of hemodialysis: Secondary | ICD-10-CM | POA: Diagnosis not present

## 2018-11-18 DIAGNOSIS — R252 Cramp and spasm: Secondary | ICD-10-CM | POA: Diagnosis not present

## 2018-11-18 DIAGNOSIS — D509 Iron deficiency anemia, unspecified: Secondary | ICD-10-CM | POA: Diagnosis not present

## 2018-11-19 DIAGNOSIS — R252 Cramp and spasm: Secondary | ICD-10-CM | POA: Diagnosis not present

## 2018-11-19 DIAGNOSIS — R509 Fever, unspecified: Secondary | ICD-10-CM | POA: Diagnosis not present

## 2018-11-19 DIAGNOSIS — D509 Iron deficiency anemia, unspecified: Secondary | ICD-10-CM | POA: Diagnosis not present

## 2018-11-19 DIAGNOSIS — I953 Hypotension of hemodialysis: Secondary | ICD-10-CM | POA: Diagnosis not present

## 2018-11-19 DIAGNOSIS — N186 End stage renal disease: Secondary | ICD-10-CM | POA: Diagnosis not present

## 2018-11-19 DIAGNOSIS — R51 Headache: Secondary | ICD-10-CM | POA: Diagnosis not present

## 2018-11-21 DIAGNOSIS — N186 End stage renal disease: Secondary | ICD-10-CM | POA: Diagnosis not present

## 2018-11-21 DIAGNOSIS — T8610 Unspecified complication of kidney transplant: Secondary | ICD-10-CM | POA: Diagnosis not present

## 2018-11-21 DIAGNOSIS — Z992 Dependence on renal dialysis: Secondary | ICD-10-CM | POA: Diagnosis not present

## 2018-11-22 DIAGNOSIS — N2581 Secondary hyperparathyroidism of renal origin: Secondary | ICD-10-CM | POA: Diagnosis not present

## 2018-11-22 DIAGNOSIS — R51 Headache: Secondary | ICD-10-CM | POA: Diagnosis not present

## 2018-11-22 DIAGNOSIS — Z794 Long term (current) use of insulin: Secondary | ICD-10-CM | POA: Diagnosis not present

## 2018-11-22 DIAGNOSIS — N186 End stage renal disease: Secondary | ICD-10-CM | POA: Diagnosis not present

## 2018-11-22 DIAGNOSIS — T8610 Unspecified complication of kidney transplant: Secondary | ICD-10-CM | POA: Diagnosis not present

## 2018-11-22 DIAGNOSIS — R252 Cramp and spasm: Secondary | ICD-10-CM | POA: Diagnosis not present

## 2018-11-22 DIAGNOSIS — I120 Hypertensive chronic kidney disease with stage 5 chronic kidney disease or end stage renal disease: Secondary | ICD-10-CM | POA: Diagnosis not present

## 2018-11-22 DIAGNOSIS — I953 Hypotension of hemodialysis: Secondary | ICD-10-CM | POA: Diagnosis not present

## 2018-11-22 DIAGNOSIS — E1122 Type 2 diabetes mellitus with diabetic chronic kidney disease: Secondary | ICD-10-CM | POA: Diagnosis not present

## 2018-11-22 DIAGNOSIS — D509 Iron deficiency anemia, unspecified: Secondary | ICD-10-CM | POA: Diagnosis not present

## 2018-11-22 DIAGNOSIS — Z992 Dependence on renal dialysis: Secondary | ICD-10-CM | POA: Diagnosis not present

## 2018-11-24 DIAGNOSIS — R51 Headache: Secondary | ICD-10-CM | POA: Diagnosis not present

## 2018-11-24 DIAGNOSIS — R252 Cramp and spasm: Secondary | ICD-10-CM | POA: Diagnosis not present

## 2018-11-24 DIAGNOSIS — D509 Iron deficiency anemia, unspecified: Secondary | ICD-10-CM | POA: Diagnosis not present

## 2018-11-24 DIAGNOSIS — Z794 Long term (current) use of insulin: Secondary | ICD-10-CM | POA: Diagnosis not present

## 2018-11-24 DIAGNOSIS — N186 End stage renal disease: Secondary | ICD-10-CM | POA: Diagnosis not present

## 2018-11-24 DIAGNOSIS — Z992 Dependence on renal dialysis: Secondary | ICD-10-CM | POA: Diagnosis not present

## 2018-11-25 DIAGNOSIS — D509 Iron deficiency anemia, unspecified: Secondary | ICD-10-CM | POA: Diagnosis not present

## 2018-11-25 DIAGNOSIS — R51 Headache: Secondary | ICD-10-CM | POA: Diagnosis not present

## 2018-11-25 DIAGNOSIS — N186 End stage renal disease: Secondary | ICD-10-CM | POA: Diagnosis not present

## 2018-11-25 DIAGNOSIS — Z992 Dependence on renal dialysis: Secondary | ICD-10-CM | POA: Diagnosis not present

## 2018-11-25 DIAGNOSIS — Z794 Long term (current) use of insulin: Secondary | ICD-10-CM | POA: Diagnosis not present

## 2018-11-25 DIAGNOSIS — R252 Cramp and spasm: Secondary | ICD-10-CM | POA: Diagnosis not present

## 2018-11-26 DIAGNOSIS — R252 Cramp and spasm: Secondary | ICD-10-CM | POA: Diagnosis not present

## 2018-11-26 DIAGNOSIS — Z992 Dependence on renal dialysis: Secondary | ICD-10-CM | POA: Diagnosis not present

## 2018-11-26 DIAGNOSIS — N186 End stage renal disease: Secondary | ICD-10-CM | POA: Diagnosis not present

## 2018-11-26 DIAGNOSIS — R51 Headache: Secondary | ICD-10-CM | POA: Diagnosis not present

## 2018-11-26 DIAGNOSIS — D509 Iron deficiency anemia, unspecified: Secondary | ICD-10-CM | POA: Diagnosis not present

## 2018-11-26 DIAGNOSIS — Z794 Long term (current) use of insulin: Secondary | ICD-10-CM | POA: Diagnosis not present

## 2018-11-29 DIAGNOSIS — Z992 Dependence on renal dialysis: Secondary | ICD-10-CM | POA: Diagnosis not present

## 2018-11-29 DIAGNOSIS — Z794 Long term (current) use of insulin: Secondary | ICD-10-CM | POA: Diagnosis not present

## 2018-11-29 DIAGNOSIS — N186 End stage renal disease: Secondary | ICD-10-CM | POA: Diagnosis not present

## 2018-11-29 DIAGNOSIS — R51 Headache: Secondary | ICD-10-CM | POA: Diagnosis not present

## 2018-11-29 DIAGNOSIS — D509 Iron deficiency anemia, unspecified: Secondary | ICD-10-CM | POA: Diagnosis not present

## 2018-11-29 DIAGNOSIS — R252 Cramp and spasm: Secondary | ICD-10-CM | POA: Diagnosis not present

## 2018-12-01 DIAGNOSIS — Z992 Dependence on renal dialysis: Secondary | ICD-10-CM | POA: Diagnosis not present

## 2018-12-01 DIAGNOSIS — Z794 Long term (current) use of insulin: Secondary | ICD-10-CM | POA: Diagnosis not present

## 2018-12-01 DIAGNOSIS — R51 Headache: Secondary | ICD-10-CM | POA: Diagnosis not present

## 2018-12-01 DIAGNOSIS — R252 Cramp and spasm: Secondary | ICD-10-CM | POA: Diagnosis not present

## 2018-12-01 DIAGNOSIS — N186 End stage renal disease: Secondary | ICD-10-CM | POA: Diagnosis not present

## 2018-12-01 DIAGNOSIS — D509 Iron deficiency anemia, unspecified: Secondary | ICD-10-CM | POA: Diagnosis not present

## 2018-12-02 DIAGNOSIS — N186 End stage renal disease: Secondary | ICD-10-CM | POA: Diagnosis not present

## 2018-12-02 DIAGNOSIS — D509 Iron deficiency anemia, unspecified: Secondary | ICD-10-CM | POA: Diagnosis not present

## 2018-12-02 DIAGNOSIS — Z794 Long term (current) use of insulin: Secondary | ICD-10-CM | POA: Diagnosis not present

## 2018-12-02 DIAGNOSIS — Z992 Dependence on renal dialysis: Secondary | ICD-10-CM | POA: Diagnosis not present

## 2018-12-02 DIAGNOSIS — R252 Cramp and spasm: Secondary | ICD-10-CM | POA: Diagnosis not present

## 2018-12-02 DIAGNOSIS — R51 Headache: Secondary | ICD-10-CM | POA: Diagnosis not present

## 2018-12-03 DIAGNOSIS — Z992 Dependence on renal dialysis: Secondary | ICD-10-CM | POA: Diagnosis not present

## 2018-12-03 DIAGNOSIS — R51 Headache: Secondary | ICD-10-CM | POA: Diagnosis not present

## 2018-12-03 DIAGNOSIS — Z794 Long term (current) use of insulin: Secondary | ICD-10-CM | POA: Diagnosis not present

## 2018-12-03 DIAGNOSIS — D509 Iron deficiency anemia, unspecified: Secondary | ICD-10-CM | POA: Diagnosis not present

## 2018-12-03 DIAGNOSIS — N186 End stage renal disease: Secondary | ICD-10-CM | POA: Diagnosis not present

## 2018-12-03 DIAGNOSIS — R252 Cramp and spasm: Secondary | ICD-10-CM | POA: Diagnosis not present

## 2018-12-06 DIAGNOSIS — M109 Gout, unspecified: Secondary | ICD-10-CM | POA: Diagnosis not present

## 2018-12-06 DIAGNOSIS — N186 End stage renal disease: Secondary | ICD-10-CM | POA: Diagnosis not present

## 2018-12-06 DIAGNOSIS — Z794 Long term (current) use of insulin: Secondary | ICD-10-CM | POA: Diagnosis not present

## 2018-12-06 DIAGNOSIS — R252 Cramp and spasm: Secondary | ICD-10-CM | POA: Diagnosis not present

## 2018-12-06 DIAGNOSIS — Z992 Dependence on renal dialysis: Secondary | ICD-10-CM | POA: Diagnosis not present

## 2018-12-06 DIAGNOSIS — D509 Iron deficiency anemia, unspecified: Secondary | ICD-10-CM | POA: Diagnosis not present

## 2018-12-06 DIAGNOSIS — R51 Headache: Secondary | ICD-10-CM | POA: Diagnosis not present

## 2018-12-08 DIAGNOSIS — D509 Iron deficiency anemia, unspecified: Secondary | ICD-10-CM | POA: Diagnosis not present

## 2018-12-08 DIAGNOSIS — Z992 Dependence on renal dialysis: Secondary | ICD-10-CM | POA: Diagnosis not present

## 2018-12-08 DIAGNOSIS — R51 Headache: Secondary | ICD-10-CM | POA: Diagnosis not present

## 2018-12-08 DIAGNOSIS — N186 End stage renal disease: Secondary | ICD-10-CM | POA: Diagnosis not present

## 2018-12-08 DIAGNOSIS — R252 Cramp and spasm: Secondary | ICD-10-CM | POA: Diagnosis not present

## 2018-12-08 DIAGNOSIS — Z794 Long term (current) use of insulin: Secondary | ICD-10-CM | POA: Diagnosis not present

## 2018-12-09 DIAGNOSIS — Z794 Long term (current) use of insulin: Secondary | ICD-10-CM | POA: Diagnosis not present

## 2018-12-09 DIAGNOSIS — R252 Cramp and spasm: Secondary | ICD-10-CM | POA: Diagnosis not present

## 2018-12-09 DIAGNOSIS — D509 Iron deficiency anemia, unspecified: Secondary | ICD-10-CM | POA: Diagnosis not present

## 2018-12-09 DIAGNOSIS — R51 Headache: Secondary | ICD-10-CM | POA: Diagnosis not present

## 2018-12-09 DIAGNOSIS — N186 End stage renal disease: Secondary | ICD-10-CM | POA: Diagnosis not present

## 2018-12-09 DIAGNOSIS — Z992 Dependence on renal dialysis: Secondary | ICD-10-CM | POA: Diagnosis not present

## 2018-12-10 DIAGNOSIS — R252 Cramp and spasm: Secondary | ICD-10-CM | POA: Diagnosis not present

## 2018-12-10 DIAGNOSIS — N186 End stage renal disease: Secondary | ICD-10-CM | POA: Diagnosis not present

## 2018-12-10 DIAGNOSIS — D509 Iron deficiency anemia, unspecified: Secondary | ICD-10-CM | POA: Diagnosis not present

## 2018-12-10 DIAGNOSIS — Z992 Dependence on renal dialysis: Secondary | ICD-10-CM | POA: Diagnosis not present

## 2018-12-10 DIAGNOSIS — R51 Headache: Secondary | ICD-10-CM | POA: Diagnosis not present

## 2018-12-10 DIAGNOSIS — Z794 Long term (current) use of insulin: Secondary | ICD-10-CM | POA: Diagnosis not present

## 2018-12-13 DIAGNOSIS — D509 Iron deficiency anemia, unspecified: Secondary | ICD-10-CM | POA: Diagnosis not present

## 2018-12-13 DIAGNOSIS — R51 Headache: Secondary | ICD-10-CM | POA: Diagnosis not present

## 2018-12-13 DIAGNOSIS — N186 End stage renal disease: Secondary | ICD-10-CM | POA: Diagnosis not present

## 2018-12-13 DIAGNOSIS — Z794 Long term (current) use of insulin: Secondary | ICD-10-CM | POA: Diagnosis not present

## 2018-12-13 DIAGNOSIS — R252 Cramp and spasm: Secondary | ICD-10-CM | POA: Diagnosis not present

## 2018-12-13 DIAGNOSIS — Z992 Dependence on renal dialysis: Secondary | ICD-10-CM | POA: Diagnosis not present

## 2018-12-15 DIAGNOSIS — N186 End stage renal disease: Secondary | ICD-10-CM | POA: Diagnosis not present

## 2018-12-15 DIAGNOSIS — Z794 Long term (current) use of insulin: Secondary | ICD-10-CM | POA: Diagnosis not present

## 2018-12-15 DIAGNOSIS — D509 Iron deficiency anemia, unspecified: Secondary | ICD-10-CM | POA: Diagnosis not present

## 2018-12-15 DIAGNOSIS — Z992 Dependence on renal dialysis: Secondary | ICD-10-CM | POA: Diagnosis not present

## 2018-12-15 DIAGNOSIS — R51 Headache: Secondary | ICD-10-CM | POA: Diagnosis not present

## 2018-12-15 DIAGNOSIS — R252 Cramp and spasm: Secondary | ICD-10-CM | POA: Diagnosis not present

## 2018-12-16 DIAGNOSIS — R51 Headache: Secondary | ICD-10-CM | POA: Diagnosis not present

## 2018-12-16 DIAGNOSIS — Z794 Long term (current) use of insulin: Secondary | ICD-10-CM | POA: Diagnosis not present

## 2018-12-16 DIAGNOSIS — D509 Iron deficiency anemia, unspecified: Secondary | ICD-10-CM | POA: Diagnosis not present

## 2018-12-16 DIAGNOSIS — R252 Cramp and spasm: Secondary | ICD-10-CM | POA: Diagnosis not present

## 2018-12-16 DIAGNOSIS — Z992 Dependence on renal dialysis: Secondary | ICD-10-CM | POA: Diagnosis not present

## 2018-12-16 DIAGNOSIS — N186 End stage renal disease: Secondary | ICD-10-CM | POA: Diagnosis not present

## 2018-12-17 DIAGNOSIS — Z992 Dependence on renal dialysis: Secondary | ICD-10-CM | POA: Diagnosis not present

## 2018-12-17 DIAGNOSIS — R252 Cramp and spasm: Secondary | ICD-10-CM | POA: Diagnosis not present

## 2018-12-17 DIAGNOSIS — D509 Iron deficiency anemia, unspecified: Secondary | ICD-10-CM | POA: Diagnosis not present

## 2018-12-17 DIAGNOSIS — N186 End stage renal disease: Secondary | ICD-10-CM | POA: Diagnosis not present

## 2018-12-17 DIAGNOSIS — R51 Headache: Secondary | ICD-10-CM | POA: Diagnosis not present

## 2018-12-17 DIAGNOSIS — Z794 Long term (current) use of insulin: Secondary | ICD-10-CM | POA: Diagnosis not present

## 2018-12-20 DIAGNOSIS — D509 Iron deficiency anemia, unspecified: Secondary | ICD-10-CM | POA: Diagnosis not present

## 2018-12-20 DIAGNOSIS — R51 Headache: Secondary | ICD-10-CM | POA: Diagnosis not present

## 2018-12-20 DIAGNOSIS — Z992 Dependence on renal dialysis: Secondary | ICD-10-CM | POA: Diagnosis not present

## 2018-12-20 DIAGNOSIS — R252 Cramp and spasm: Secondary | ICD-10-CM | POA: Diagnosis not present

## 2018-12-20 DIAGNOSIS — N186 End stage renal disease: Secondary | ICD-10-CM | POA: Diagnosis not present

## 2018-12-20 DIAGNOSIS — Z794 Long term (current) use of insulin: Secondary | ICD-10-CM | POA: Diagnosis not present

## 2018-12-21 DIAGNOSIS — R87612 Low grade squamous intraepithelial lesion on cytologic smear of cervix (LGSIL): Secondary | ICD-10-CM | POA: Diagnosis not present

## 2018-12-21 DIAGNOSIS — Z1231 Encounter for screening mammogram for malignant neoplasm of breast: Secondary | ICD-10-CM | POA: Diagnosis not present

## 2018-12-21 DIAGNOSIS — Z6827 Body mass index (BMI) 27.0-27.9, adult: Secondary | ICD-10-CM | POA: Diagnosis not present

## 2018-12-21 DIAGNOSIS — Z779 Other contact with and (suspected) exposures hazardous to health: Secondary | ICD-10-CM | POA: Diagnosis not present

## 2018-12-22 DIAGNOSIS — I953 Hypotension of hemodialysis: Secondary | ICD-10-CM | POA: Diagnosis not present

## 2018-12-22 DIAGNOSIS — I120 Hypertensive chronic kidney disease with stage 5 chronic kidney disease or end stage renal disease: Secondary | ICD-10-CM | POA: Diagnosis not present

## 2018-12-22 DIAGNOSIS — Z794 Long term (current) use of insulin: Secondary | ICD-10-CM | POA: Diagnosis not present

## 2018-12-22 DIAGNOSIS — D631 Anemia in chronic kidney disease: Secondary | ICD-10-CM | POA: Diagnosis not present

## 2018-12-22 DIAGNOSIS — N186 End stage renal disease: Secondary | ICD-10-CM | POA: Diagnosis not present

## 2018-12-22 DIAGNOSIS — Z992 Dependence on renal dialysis: Secondary | ICD-10-CM | POA: Diagnosis not present

## 2018-12-22 DIAGNOSIS — N2581 Secondary hyperparathyroidism of renal origin: Secondary | ICD-10-CM | POA: Diagnosis not present

## 2018-12-22 DIAGNOSIS — E1122 Type 2 diabetes mellitus with diabetic chronic kidney disease: Secondary | ICD-10-CM | POA: Diagnosis not present

## 2018-12-22 DIAGNOSIS — R252 Cramp and spasm: Secondary | ICD-10-CM | POA: Diagnosis not present

## 2018-12-22 DIAGNOSIS — T8610 Unspecified complication of kidney transplant: Secondary | ICD-10-CM | POA: Diagnosis not present

## 2018-12-22 DIAGNOSIS — R51 Headache: Secondary | ICD-10-CM | POA: Diagnosis not present

## 2018-12-23 DIAGNOSIS — N186 End stage renal disease: Secondary | ICD-10-CM | POA: Diagnosis not present

## 2018-12-23 DIAGNOSIS — R252 Cramp and spasm: Secondary | ICD-10-CM | POA: Diagnosis not present

## 2018-12-23 DIAGNOSIS — D631 Anemia in chronic kidney disease: Secondary | ICD-10-CM | POA: Diagnosis not present

## 2018-12-23 DIAGNOSIS — Z992 Dependence on renal dialysis: Secondary | ICD-10-CM | POA: Diagnosis not present

## 2018-12-23 DIAGNOSIS — R51 Headache: Secondary | ICD-10-CM | POA: Diagnosis not present

## 2018-12-23 DIAGNOSIS — Z794 Long term (current) use of insulin: Secondary | ICD-10-CM | POA: Diagnosis not present

## 2018-12-24 DIAGNOSIS — N186 End stage renal disease: Secondary | ICD-10-CM | POA: Diagnosis not present

## 2018-12-24 DIAGNOSIS — D631 Anemia in chronic kidney disease: Secondary | ICD-10-CM | POA: Diagnosis not present

## 2018-12-24 DIAGNOSIS — R51 Headache: Secondary | ICD-10-CM | POA: Diagnosis not present

## 2018-12-24 DIAGNOSIS — Z992 Dependence on renal dialysis: Secondary | ICD-10-CM | POA: Diagnosis not present

## 2018-12-24 DIAGNOSIS — Z794 Long term (current) use of insulin: Secondary | ICD-10-CM | POA: Diagnosis not present

## 2018-12-24 DIAGNOSIS — R252 Cramp and spasm: Secondary | ICD-10-CM | POA: Diagnosis not present

## 2018-12-27 DIAGNOSIS — R51 Headache: Secondary | ICD-10-CM | POA: Diagnosis not present

## 2018-12-27 DIAGNOSIS — N186 End stage renal disease: Secondary | ICD-10-CM | POA: Diagnosis not present

## 2018-12-27 DIAGNOSIS — Z992 Dependence on renal dialysis: Secondary | ICD-10-CM | POA: Diagnosis not present

## 2018-12-27 DIAGNOSIS — R252 Cramp and spasm: Secondary | ICD-10-CM | POA: Diagnosis not present

## 2018-12-27 DIAGNOSIS — Z794 Long term (current) use of insulin: Secondary | ICD-10-CM | POA: Diagnosis not present

## 2018-12-27 DIAGNOSIS — D631 Anemia in chronic kidney disease: Secondary | ICD-10-CM | POA: Diagnosis not present

## 2018-12-29 DIAGNOSIS — R252 Cramp and spasm: Secondary | ICD-10-CM | POA: Diagnosis not present

## 2018-12-29 DIAGNOSIS — D631 Anemia in chronic kidney disease: Secondary | ICD-10-CM | POA: Diagnosis not present

## 2018-12-29 DIAGNOSIS — Z794 Long term (current) use of insulin: Secondary | ICD-10-CM | POA: Diagnosis not present

## 2018-12-29 DIAGNOSIS — Z992 Dependence on renal dialysis: Secondary | ICD-10-CM | POA: Diagnosis not present

## 2018-12-29 DIAGNOSIS — N186 End stage renal disease: Secondary | ICD-10-CM | POA: Diagnosis not present

## 2018-12-29 DIAGNOSIS — R51 Headache: Secondary | ICD-10-CM | POA: Diagnosis not present

## 2018-12-30 DIAGNOSIS — R51 Headache: Secondary | ICD-10-CM | POA: Diagnosis not present

## 2018-12-30 DIAGNOSIS — Z794 Long term (current) use of insulin: Secondary | ICD-10-CM | POA: Diagnosis not present

## 2018-12-30 DIAGNOSIS — R252 Cramp and spasm: Secondary | ICD-10-CM | POA: Diagnosis not present

## 2018-12-30 DIAGNOSIS — N186 End stage renal disease: Secondary | ICD-10-CM | POA: Diagnosis not present

## 2018-12-30 DIAGNOSIS — Z992 Dependence on renal dialysis: Secondary | ICD-10-CM | POA: Diagnosis not present

## 2018-12-30 DIAGNOSIS — D631 Anemia in chronic kidney disease: Secondary | ICD-10-CM | POA: Diagnosis not present

## 2018-12-30 DIAGNOSIS — M109 Gout, unspecified: Secondary | ICD-10-CM | POA: Diagnosis not present

## 2018-12-30 DIAGNOSIS — E1129 Type 2 diabetes mellitus with other diabetic kidney complication: Secondary | ICD-10-CM | POA: Diagnosis not present

## 2018-12-31 DIAGNOSIS — N186 End stage renal disease: Secondary | ICD-10-CM | POA: Diagnosis not present

## 2018-12-31 DIAGNOSIS — Z794 Long term (current) use of insulin: Secondary | ICD-10-CM | POA: Diagnosis not present

## 2018-12-31 DIAGNOSIS — D631 Anemia in chronic kidney disease: Secondary | ICD-10-CM | POA: Diagnosis not present

## 2018-12-31 DIAGNOSIS — R51 Headache: Secondary | ICD-10-CM | POA: Diagnosis not present

## 2018-12-31 DIAGNOSIS — Z992 Dependence on renal dialysis: Secondary | ICD-10-CM | POA: Diagnosis not present

## 2018-12-31 DIAGNOSIS — R252 Cramp and spasm: Secondary | ICD-10-CM | POA: Diagnosis not present

## 2019-01-03 DIAGNOSIS — Z794 Long term (current) use of insulin: Secondary | ICD-10-CM | POA: Diagnosis not present

## 2019-01-03 DIAGNOSIS — Z992 Dependence on renal dialysis: Secondary | ICD-10-CM | POA: Diagnosis not present

## 2019-01-03 DIAGNOSIS — D631 Anemia in chronic kidney disease: Secondary | ICD-10-CM | POA: Diagnosis not present

## 2019-01-03 DIAGNOSIS — R252 Cramp and spasm: Secondary | ICD-10-CM | POA: Diagnosis not present

## 2019-01-03 DIAGNOSIS — N186 End stage renal disease: Secondary | ICD-10-CM | POA: Diagnosis not present

## 2019-01-03 DIAGNOSIS — R51 Headache: Secondary | ICD-10-CM | POA: Diagnosis not present

## 2019-01-05 DIAGNOSIS — R51 Headache: Secondary | ICD-10-CM | POA: Diagnosis not present

## 2019-01-05 DIAGNOSIS — D631 Anemia in chronic kidney disease: Secondary | ICD-10-CM | POA: Diagnosis not present

## 2019-01-05 DIAGNOSIS — Z992 Dependence on renal dialysis: Secondary | ICD-10-CM | POA: Diagnosis not present

## 2019-01-05 DIAGNOSIS — Z794 Long term (current) use of insulin: Secondary | ICD-10-CM | POA: Diagnosis not present

## 2019-01-05 DIAGNOSIS — R252 Cramp and spasm: Secondary | ICD-10-CM | POA: Diagnosis not present

## 2019-01-05 DIAGNOSIS — N186 End stage renal disease: Secondary | ICD-10-CM | POA: Diagnosis not present

## 2019-01-06 DIAGNOSIS — Z794 Long term (current) use of insulin: Secondary | ICD-10-CM | POA: Diagnosis not present

## 2019-01-06 DIAGNOSIS — D631 Anemia in chronic kidney disease: Secondary | ICD-10-CM | POA: Diagnosis not present

## 2019-01-06 DIAGNOSIS — R252 Cramp and spasm: Secondary | ICD-10-CM | POA: Diagnosis not present

## 2019-01-06 DIAGNOSIS — N186 End stage renal disease: Secondary | ICD-10-CM | POA: Diagnosis not present

## 2019-01-06 DIAGNOSIS — Z992 Dependence on renal dialysis: Secondary | ICD-10-CM | POA: Diagnosis not present

## 2019-01-06 DIAGNOSIS — R51 Headache: Secondary | ICD-10-CM | POA: Diagnosis not present

## 2019-01-07 DIAGNOSIS — D631 Anemia in chronic kidney disease: Secondary | ICD-10-CM | POA: Diagnosis not present

## 2019-01-07 DIAGNOSIS — R51 Headache: Secondary | ICD-10-CM | POA: Diagnosis not present

## 2019-01-07 DIAGNOSIS — Z794 Long term (current) use of insulin: Secondary | ICD-10-CM | POA: Diagnosis not present

## 2019-01-07 DIAGNOSIS — Z992 Dependence on renal dialysis: Secondary | ICD-10-CM | POA: Diagnosis not present

## 2019-01-07 DIAGNOSIS — R252 Cramp and spasm: Secondary | ICD-10-CM | POA: Diagnosis not present

## 2019-01-07 DIAGNOSIS — N186 End stage renal disease: Secondary | ICD-10-CM | POA: Diagnosis not present

## 2019-01-10 DIAGNOSIS — Z794 Long term (current) use of insulin: Secondary | ICD-10-CM | POA: Diagnosis not present

## 2019-01-10 DIAGNOSIS — R51 Headache: Secondary | ICD-10-CM | POA: Diagnosis not present

## 2019-01-10 DIAGNOSIS — D631 Anemia in chronic kidney disease: Secondary | ICD-10-CM | POA: Diagnosis not present

## 2019-01-10 DIAGNOSIS — N186 End stage renal disease: Secondary | ICD-10-CM | POA: Diagnosis not present

## 2019-01-10 DIAGNOSIS — R252 Cramp and spasm: Secondary | ICD-10-CM | POA: Diagnosis not present

## 2019-01-10 DIAGNOSIS — Z992 Dependence on renal dialysis: Secondary | ICD-10-CM | POA: Diagnosis not present

## 2019-01-12 DIAGNOSIS — E0590 Thyrotoxicosis, unspecified without thyrotoxic crisis or storm: Secondary | ICD-10-CM | POA: Diagnosis not present

## 2019-01-12 DIAGNOSIS — E1129 Type 2 diabetes mellitus with other diabetic kidney complication: Secondary | ICD-10-CM | POA: Diagnosis not present

## 2019-01-12 DIAGNOSIS — R252 Cramp and spasm: Secondary | ICD-10-CM | POA: Diagnosis not present

## 2019-01-12 DIAGNOSIS — D631 Anemia in chronic kidney disease: Secondary | ICD-10-CM | POA: Diagnosis not present

## 2019-01-12 DIAGNOSIS — E1139 Type 2 diabetes mellitus with other diabetic ophthalmic complication: Secondary | ICD-10-CM | POA: Diagnosis not present

## 2019-01-12 DIAGNOSIS — I10 Essential (primary) hypertension: Secondary | ICD-10-CM | POA: Diagnosis not present

## 2019-01-12 DIAGNOSIS — Z992 Dependence on renal dialysis: Secondary | ICD-10-CM | POA: Diagnosis not present

## 2019-01-12 DIAGNOSIS — E785 Hyperlipidemia, unspecified: Secondary | ICD-10-CM | POA: Diagnosis not present

## 2019-01-12 DIAGNOSIS — N186 End stage renal disease: Secondary | ICD-10-CM | POA: Diagnosis not present

## 2019-01-12 DIAGNOSIS — R51 Headache: Secondary | ICD-10-CM | POA: Diagnosis not present

## 2019-01-12 DIAGNOSIS — Z794 Long term (current) use of insulin: Secondary | ICD-10-CM | POA: Diagnosis not present

## 2019-01-12 DIAGNOSIS — Z940 Kidney transplant status: Secondary | ICD-10-CM | POA: Diagnosis not present

## 2019-01-13 DIAGNOSIS — R252 Cramp and spasm: Secondary | ICD-10-CM | POA: Diagnosis not present

## 2019-01-13 DIAGNOSIS — N186 End stage renal disease: Secondary | ICD-10-CM | POA: Diagnosis not present

## 2019-01-13 DIAGNOSIS — Z992 Dependence on renal dialysis: Secondary | ICD-10-CM | POA: Diagnosis not present

## 2019-01-13 DIAGNOSIS — Z794 Long term (current) use of insulin: Secondary | ICD-10-CM | POA: Diagnosis not present

## 2019-01-13 DIAGNOSIS — R51 Headache: Secondary | ICD-10-CM | POA: Diagnosis not present

## 2019-01-13 DIAGNOSIS — D631 Anemia in chronic kidney disease: Secondary | ICD-10-CM | POA: Diagnosis not present

## 2019-01-14 DIAGNOSIS — Z794 Long term (current) use of insulin: Secondary | ICD-10-CM | POA: Diagnosis not present

## 2019-01-14 DIAGNOSIS — D631 Anemia in chronic kidney disease: Secondary | ICD-10-CM | POA: Diagnosis not present

## 2019-01-14 DIAGNOSIS — E7849 Other hyperlipidemia: Secondary | ICD-10-CM | POA: Diagnosis not present

## 2019-01-14 DIAGNOSIS — R252 Cramp and spasm: Secondary | ICD-10-CM | POA: Diagnosis not present

## 2019-01-14 DIAGNOSIS — I10 Essential (primary) hypertension: Secondary | ICD-10-CM | POA: Diagnosis not present

## 2019-01-14 DIAGNOSIS — E1129 Type 2 diabetes mellitus with other diabetic kidney complication: Secondary | ICD-10-CM | POA: Diagnosis not present

## 2019-01-14 DIAGNOSIS — Z992 Dependence on renal dialysis: Secondary | ICD-10-CM | POA: Diagnosis not present

## 2019-01-14 DIAGNOSIS — N186 End stage renal disease: Secondary | ICD-10-CM | POA: Diagnosis not present

## 2019-01-14 DIAGNOSIS — R51 Headache: Secondary | ICD-10-CM | POA: Diagnosis not present

## 2019-01-17 DIAGNOSIS — R51 Headache: Secondary | ICD-10-CM | POA: Diagnosis not present

## 2019-01-17 DIAGNOSIS — R252 Cramp and spasm: Secondary | ICD-10-CM | POA: Diagnosis not present

## 2019-01-17 DIAGNOSIS — Z992 Dependence on renal dialysis: Secondary | ICD-10-CM | POA: Diagnosis not present

## 2019-01-17 DIAGNOSIS — N186 End stage renal disease: Secondary | ICD-10-CM | POA: Diagnosis not present

## 2019-01-17 DIAGNOSIS — Z794 Long term (current) use of insulin: Secondary | ICD-10-CM | POA: Diagnosis not present

## 2019-01-17 DIAGNOSIS — D631 Anemia in chronic kidney disease: Secondary | ICD-10-CM | POA: Diagnosis not present

## 2019-01-19 DIAGNOSIS — Z992 Dependence on renal dialysis: Secondary | ICD-10-CM | POA: Diagnosis not present

## 2019-01-19 DIAGNOSIS — N186 End stage renal disease: Secondary | ICD-10-CM | POA: Diagnosis not present

## 2019-01-19 DIAGNOSIS — E0590 Thyrotoxicosis, unspecified without thyrotoxic crisis or storm: Secondary | ICD-10-CM | POA: Diagnosis not present

## 2019-01-19 DIAGNOSIS — R51 Headache: Secondary | ICD-10-CM | POA: Diagnosis not present

## 2019-01-19 DIAGNOSIS — D631 Anemia in chronic kidney disease: Secondary | ICD-10-CM | POA: Diagnosis not present

## 2019-01-19 DIAGNOSIS — R252 Cramp and spasm: Secondary | ICD-10-CM | POA: Diagnosis not present

## 2019-01-19 DIAGNOSIS — Z794 Long term (current) use of insulin: Secondary | ICD-10-CM | POA: Diagnosis not present

## 2019-01-20 DIAGNOSIS — R252 Cramp and spasm: Secondary | ICD-10-CM | POA: Diagnosis not present

## 2019-01-20 DIAGNOSIS — D631 Anemia in chronic kidney disease: Secondary | ICD-10-CM | POA: Diagnosis not present

## 2019-01-20 DIAGNOSIS — R51 Headache: Secondary | ICD-10-CM | POA: Diagnosis not present

## 2019-01-20 DIAGNOSIS — Z992 Dependence on renal dialysis: Secondary | ICD-10-CM | POA: Diagnosis not present

## 2019-01-20 DIAGNOSIS — Z794 Long term (current) use of insulin: Secondary | ICD-10-CM | POA: Diagnosis not present

## 2019-01-20 DIAGNOSIS — N186 End stage renal disease: Secondary | ICD-10-CM | POA: Diagnosis not present

## 2019-01-21 DIAGNOSIS — N186 End stage renal disease: Secondary | ICD-10-CM | POA: Diagnosis not present

## 2019-01-21 DIAGNOSIS — R51 Headache: Secondary | ICD-10-CM | POA: Diagnosis not present

## 2019-01-21 DIAGNOSIS — R252 Cramp and spasm: Secondary | ICD-10-CM | POA: Diagnosis not present

## 2019-01-21 DIAGNOSIS — Z794 Long term (current) use of insulin: Secondary | ICD-10-CM | POA: Diagnosis not present

## 2019-01-21 DIAGNOSIS — Z992 Dependence on renal dialysis: Secondary | ICD-10-CM | POA: Diagnosis not present

## 2019-01-21 DIAGNOSIS — D631 Anemia in chronic kidney disease: Secondary | ICD-10-CM | POA: Diagnosis not present

## 2019-01-22 DIAGNOSIS — Z992 Dependence on renal dialysis: Secondary | ICD-10-CM | POA: Diagnosis not present

## 2019-01-22 DIAGNOSIS — N186 End stage renal disease: Secondary | ICD-10-CM | POA: Diagnosis not present

## 2019-01-22 DIAGNOSIS — T8610 Unspecified complication of kidney transplant: Secondary | ICD-10-CM | POA: Diagnosis not present

## 2019-01-24 DIAGNOSIS — M109 Gout, unspecified: Secondary | ICD-10-CM | POA: Diagnosis not present

## 2019-01-24 DIAGNOSIS — I120 Hypertensive chronic kidney disease with stage 5 chronic kidney disease or end stage renal disease: Secondary | ICD-10-CM | POA: Diagnosis not present

## 2019-01-24 DIAGNOSIS — Z794 Long term (current) use of insulin: Secondary | ICD-10-CM | POA: Diagnosis not present

## 2019-01-24 DIAGNOSIS — R51 Headache: Secondary | ICD-10-CM | POA: Diagnosis not present

## 2019-01-24 DIAGNOSIS — Z992 Dependence on renal dialysis: Secondary | ICD-10-CM | POA: Diagnosis not present

## 2019-01-24 DIAGNOSIS — N186 End stage renal disease: Secondary | ICD-10-CM | POA: Diagnosis not present

## 2019-01-24 DIAGNOSIS — E1122 Type 2 diabetes mellitus with diabetic chronic kidney disease: Secondary | ICD-10-CM | POA: Diagnosis not present

## 2019-01-24 DIAGNOSIS — I953 Hypotension of hemodialysis: Secondary | ICD-10-CM | POA: Diagnosis not present

## 2019-01-24 DIAGNOSIS — D631 Anemia in chronic kidney disease: Secondary | ICD-10-CM | POA: Diagnosis not present

## 2019-01-24 DIAGNOSIS — D509 Iron deficiency anemia, unspecified: Secondary | ICD-10-CM | POA: Diagnosis not present

## 2019-01-24 DIAGNOSIS — N2581 Secondary hyperparathyroidism of renal origin: Secondary | ICD-10-CM | POA: Diagnosis not present

## 2019-01-24 DIAGNOSIS — R252 Cramp and spasm: Secondary | ICD-10-CM | POA: Diagnosis not present

## 2019-01-26 DIAGNOSIS — Z794 Long term (current) use of insulin: Secondary | ICD-10-CM | POA: Diagnosis not present

## 2019-01-26 DIAGNOSIS — D509 Iron deficiency anemia, unspecified: Secondary | ICD-10-CM | POA: Diagnosis not present

## 2019-01-26 DIAGNOSIS — R51 Headache: Secondary | ICD-10-CM | POA: Diagnosis not present

## 2019-01-26 DIAGNOSIS — D631 Anemia in chronic kidney disease: Secondary | ICD-10-CM | POA: Diagnosis not present

## 2019-01-26 DIAGNOSIS — Z992 Dependence on renal dialysis: Secondary | ICD-10-CM | POA: Diagnosis not present

## 2019-01-26 DIAGNOSIS — N186 End stage renal disease: Secondary | ICD-10-CM | POA: Diagnosis not present

## 2019-01-27 DIAGNOSIS — D509 Iron deficiency anemia, unspecified: Secondary | ICD-10-CM | POA: Diagnosis not present

## 2019-01-27 DIAGNOSIS — N186 End stage renal disease: Secondary | ICD-10-CM | POA: Diagnosis not present

## 2019-01-27 DIAGNOSIS — D631 Anemia in chronic kidney disease: Secondary | ICD-10-CM | POA: Diagnosis not present

## 2019-01-27 DIAGNOSIS — Z992 Dependence on renal dialysis: Secondary | ICD-10-CM | POA: Diagnosis not present

## 2019-01-27 DIAGNOSIS — Z794 Long term (current) use of insulin: Secondary | ICD-10-CM | POA: Diagnosis not present

## 2019-01-27 DIAGNOSIS — R51 Headache: Secondary | ICD-10-CM | POA: Diagnosis not present

## 2019-01-28 DIAGNOSIS — Z992 Dependence on renal dialysis: Secondary | ICD-10-CM | POA: Diagnosis not present

## 2019-01-28 DIAGNOSIS — Z794 Long term (current) use of insulin: Secondary | ICD-10-CM | POA: Diagnosis not present

## 2019-01-28 DIAGNOSIS — D509 Iron deficiency anemia, unspecified: Secondary | ICD-10-CM | POA: Diagnosis not present

## 2019-01-28 DIAGNOSIS — N186 End stage renal disease: Secondary | ICD-10-CM | POA: Diagnosis not present

## 2019-01-28 DIAGNOSIS — D631 Anemia in chronic kidney disease: Secondary | ICD-10-CM | POA: Diagnosis not present

## 2019-01-28 DIAGNOSIS — R51 Headache: Secondary | ICD-10-CM | POA: Diagnosis not present

## 2019-01-31 DIAGNOSIS — Z794 Long term (current) use of insulin: Secondary | ICD-10-CM | POA: Diagnosis not present

## 2019-01-31 DIAGNOSIS — D631 Anemia in chronic kidney disease: Secondary | ICD-10-CM | POA: Diagnosis not present

## 2019-01-31 DIAGNOSIS — Z992 Dependence on renal dialysis: Secondary | ICD-10-CM | POA: Diagnosis not present

## 2019-01-31 DIAGNOSIS — N186 End stage renal disease: Secondary | ICD-10-CM | POA: Diagnosis not present

## 2019-01-31 DIAGNOSIS — R51 Headache: Secondary | ICD-10-CM | POA: Diagnosis not present

## 2019-01-31 DIAGNOSIS — D509 Iron deficiency anemia, unspecified: Secondary | ICD-10-CM | POA: Diagnosis not present

## 2019-02-02 DIAGNOSIS — N186 End stage renal disease: Secondary | ICD-10-CM | POA: Diagnosis not present

## 2019-02-02 DIAGNOSIS — D509 Iron deficiency anemia, unspecified: Secondary | ICD-10-CM | POA: Diagnosis not present

## 2019-02-02 DIAGNOSIS — R51 Headache: Secondary | ICD-10-CM | POA: Diagnosis not present

## 2019-02-02 DIAGNOSIS — Z03818 Encounter for observation for suspected exposure to other biological agents ruled out: Secondary | ICD-10-CM | POA: Diagnosis not present

## 2019-02-02 DIAGNOSIS — D631 Anemia in chronic kidney disease: Secondary | ICD-10-CM | POA: Diagnosis not present

## 2019-02-02 DIAGNOSIS — Z992 Dependence on renal dialysis: Secondary | ICD-10-CM | POA: Diagnosis not present

## 2019-02-02 DIAGNOSIS — Z794 Long term (current) use of insulin: Secondary | ICD-10-CM | POA: Diagnosis not present

## 2019-02-03 DIAGNOSIS — R51 Headache: Secondary | ICD-10-CM | POA: Diagnosis not present

## 2019-02-03 DIAGNOSIS — Z992 Dependence on renal dialysis: Secondary | ICD-10-CM | POA: Diagnosis not present

## 2019-02-03 DIAGNOSIS — N186 End stage renal disease: Secondary | ICD-10-CM | POA: Diagnosis not present

## 2019-02-03 DIAGNOSIS — D509 Iron deficiency anemia, unspecified: Secondary | ICD-10-CM | POA: Diagnosis not present

## 2019-02-03 DIAGNOSIS — D631 Anemia in chronic kidney disease: Secondary | ICD-10-CM | POA: Diagnosis not present

## 2019-02-03 DIAGNOSIS — Z794 Long term (current) use of insulin: Secondary | ICD-10-CM | POA: Diagnosis not present

## 2019-02-04 DIAGNOSIS — D509 Iron deficiency anemia, unspecified: Secondary | ICD-10-CM | POA: Diagnosis not present

## 2019-02-04 DIAGNOSIS — R51 Headache: Secondary | ICD-10-CM | POA: Diagnosis not present

## 2019-02-04 DIAGNOSIS — Z992 Dependence on renal dialysis: Secondary | ICD-10-CM | POA: Diagnosis not present

## 2019-02-04 DIAGNOSIS — Z794 Long term (current) use of insulin: Secondary | ICD-10-CM | POA: Diagnosis not present

## 2019-02-04 DIAGNOSIS — N186 End stage renal disease: Secondary | ICD-10-CM | POA: Diagnosis not present

## 2019-02-04 DIAGNOSIS — D631 Anemia in chronic kidney disease: Secondary | ICD-10-CM | POA: Diagnosis not present

## 2019-02-07 DIAGNOSIS — R51 Headache: Secondary | ICD-10-CM | POA: Diagnosis not present

## 2019-02-07 DIAGNOSIS — Z992 Dependence on renal dialysis: Secondary | ICD-10-CM | POA: Diagnosis not present

## 2019-02-07 DIAGNOSIS — Z794 Long term (current) use of insulin: Secondary | ICD-10-CM | POA: Diagnosis not present

## 2019-02-07 DIAGNOSIS — D509 Iron deficiency anemia, unspecified: Secondary | ICD-10-CM | POA: Diagnosis not present

## 2019-02-07 DIAGNOSIS — D631 Anemia in chronic kidney disease: Secondary | ICD-10-CM | POA: Diagnosis not present

## 2019-02-07 DIAGNOSIS — N186 End stage renal disease: Secondary | ICD-10-CM | POA: Diagnosis not present

## 2019-02-09 DIAGNOSIS — N186 End stage renal disease: Secondary | ICD-10-CM | POA: Diagnosis not present

## 2019-02-09 DIAGNOSIS — R51 Headache: Secondary | ICD-10-CM | POA: Diagnosis not present

## 2019-02-09 DIAGNOSIS — D509 Iron deficiency anemia, unspecified: Secondary | ICD-10-CM | POA: Diagnosis not present

## 2019-02-09 DIAGNOSIS — Z992 Dependence on renal dialysis: Secondary | ICD-10-CM | POA: Diagnosis not present

## 2019-02-09 DIAGNOSIS — Z794 Long term (current) use of insulin: Secondary | ICD-10-CM | POA: Diagnosis not present

## 2019-02-09 DIAGNOSIS — D631 Anemia in chronic kidney disease: Secondary | ICD-10-CM | POA: Diagnosis not present

## 2019-02-10 DIAGNOSIS — R51 Headache: Secondary | ICD-10-CM | POA: Diagnosis not present

## 2019-02-10 DIAGNOSIS — Z794 Long term (current) use of insulin: Secondary | ICD-10-CM | POA: Diagnosis not present

## 2019-02-10 DIAGNOSIS — D509 Iron deficiency anemia, unspecified: Secondary | ICD-10-CM | POA: Diagnosis not present

## 2019-02-10 DIAGNOSIS — D631 Anemia in chronic kidney disease: Secondary | ICD-10-CM | POA: Diagnosis not present

## 2019-02-10 DIAGNOSIS — Z992 Dependence on renal dialysis: Secondary | ICD-10-CM | POA: Diagnosis not present

## 2019-02-10 DIAGNOSIS — N186 End stage renal disease: Secondary | ICD-10-CM | POA: Diagnosis not present

## 2019-02-11 DIAGNOSIS — Z794 Long term (current) use of insulin: Secondary | ICD-10-CM | POA: Diagnosis not present

## 2019-02-11 DIAGNOSIS — N186 End stage renal disease: Secondary | ICD-10-CM | POA: Diagnosis not present

## 2019-02-11 DIAGNOSIS — D631 Anemia in chronic kidney disease: Secondary | ICD-10-CM | POA: Diagnosis not present

## 2019-02-11 DIAGNOSIS — R51 Headache: Secondary | ICD-10-CM | POA: Diagnosis not present

## 2019-02-11 DIAGNOSIS — D509 Iron deficiency anemia, unspecified: Secondary | ICD-10-CM | POA: Diagnosis not present

## 2019-02-11 DIAGNOSIS — Z992 Dependence on renal dialysis: Secondary | ICD-10-CM | POA: Diagnosis not present

## 2019-02-14 DIAGNOSIS — R51 Headache: Secondary | ICD-10-CM | POA: Diagnosis not present

## 2019-02-14 DIAGNOSIS — D631 Anemia in chronic kidney disease: Secondary | ICD-10-CM | POA: Diagnosis not present

## 2019-02-14 DIAGNOSIS — Z794 Long term (current) use of insulin: Secondary | ICD-10-CM | POA: Diagnosis not present

## 2019-02-14 DIAGNOSIS — N186 End stage renal disease: Secondary | ICD-10-CM | POA: Diagnosis not present

## 2019-02-14 DIAGNOSIS — D509 Iron deficiency anemia, unspecified: Secondary | ICD-10-CM | POA: Diagnosis not present

## 2019-02-14 DIAGNOSIS — Z992 Dependence on renal dialysis: Secondary | ICD-10-CM | POA: Diagnosis not present

## 2019-02-15 DIAGNOSIS — N870 Mild cervical dysplasia: Secondary | ICD-10-CM | POA: Diagnosis not present

## 2019-02-15 DIAGNOSIS — R87612 Low grade squamous intraepithelial lesion on cytologic smear of cervix (LGSIL): Secondary | ICD-10-CM | POA: Diagnosis not present

## 2019-02-16 DIAGNOSIS — D631 Anemia in chronic kidney disease: Secondary | ICD-10-CM | POA: Diagnosis not present

## 2019-02-16 DIAGNOSIS — Z794 Long term (current) use of insulin: Secondary | ICD-10-CM | POA: Diagnosis not present

## 2019-02-16 DIAGNOSIS — N186 End stage renal disease: Secondary | ICD-10-CM | POA: Diagnosis not present

## 2019-02-16 DIAGNOSIS — D509 Iron deficiency anemia, unspecified: Secondary | ICD-10-CM | POA: Diagnosis not present

## 2019-02-16 DIAGNOSIS — Z992 Dependence on renal dialysis: Secondary | ICD-10-CM | POA: Diagnosis not present

## 2019-02-16 DIAGNOSIS — R51 Headache: Secondary | ICD-10-CM | POA: Diagnosis not present

## 2019-02-17 DIAGNOSIS — R51 Headache: Secondary | ICD-10-CM | POA: Diagnosis not present

## 2019-02-17 DIAGNOSIS — N186 End stage renal disease: Secondary | ICD-10-CM | POA: Diagnosis not present

## 2019-02-17 DIAGNOSIS — Z794 Long term (current) use of insulin: Secondary | ICD-10-CM | POA: Diagnosis not present

## 2019-02-17 DIAGNOSIS — D509 Iron deficiency anemia, unspecified: Secondary | ICD-10-CM | POA: Diagnosis not present

## 2019-02-17 DIAGNOSIS — Z992 Dependence on renal dialysis: Secondary | ICD-10-CM | POA: Diagnosis not present

## 2019-02-17 DIAGNOSIS — D631 Anemia in chronic kidney disease: Secondary | ICD-10-CM | POA: Diagnosis not present

## 2019-02-18 DIAGNOSIS — Z794 Long term (current) use of insulin: Secondary | ICD-10-CM | POA: Diagnosis not present

## 2019-02-18 DIAGNOSIS — D509 Iron deficiency anemia, unspecified: Secondary | ICD-10-CM | POA: Diagnosis not present

## 2019-02-18 DIAGNOSIS — R51 Headache: Secondary | ICD-10-CM | POA: Diagnosis not present

## 2019-02-18 DIAGNOSIS — Z992 Dependence on renal dialysis: Secondary | ICD-10-CM | POA: Diagnosis not present

## 2019-02-18 DIAGNOSIS — D631 Anemia in chronic kidney disease: Secondary | ICD-10-CM | POA: Diagnosis not present

## 2019-02-18 DIAGNOSIS — N186 End stage renal disease: Secondary | ICD-10-CM | POA: Diagnosis not present

## 2019-02-21 DIAGNOSIS — Z794 Long term (current) use of insulin: Secondary | ICD-10-CM | POA: Diagnosis not present

## 2019-02-21 DIAGNOSIS — N186 End stage renal disease: Secondary | ICD-10-CM | POA: Diagnosis not present

## 2019-02-21 DIAGNOSIS — D509 Iron deficiency anemia, unspecified: Secondary | ICD-10-CM | POA: Diagnosis not present

## 2019-02-21 DIAGNOSIS — R51 Headache: Secondary | ICD-10-CM | POA: Diagnosis not present

## 2019-02-21 DIAGNOSIS — Z992 Dependence on renal dialysis: Secondary | ICD-10-CM | POA: Diagnosis not present

## 2019-02-21 DIAGNOSIS — D631 Anemia in chronic kidney disease: Secondary | ICD-10-CM | POA: Diagnosis not present

## 2019-02-22 DIAGNOSIS — Z992 Dependence on renal dialysis: Secondary | ICD-10-CM | POA: Diagnosis not present

## 2019-02-22 DIAGNOSIS — N186 End stage renal disease: Secondary | ICD-10-CM | POA: Diagnosis not present

## 2019-02-22 DIAGNOSIS — T8610 Unspecified complication of kidney transplant: Secondary | ICD-10-CM | POA: Diagnosis not present

## 2019-02-23 DIAGNOSIS — N2581 Secondary hyperparathyroidism of renal origin: Secondary | ICD-10-CM | POA: Diagnosis not present

## 2019-02-23 DIAGNOSIS — I120 Hypertensive chronic kidney disease with stage 5 chronic kidney disease or end stage renal disease: Secondary | ICD-10-CM | POA: Diagnosis not present

## 2019-02-23 DIAGNOSIS — M109 Gout, unspecified: Secondary | ICD-10-CM | POA: Diagnosis not present

## 2019-02-23 DIAGNOSIS — Z794 Long term (current) use of insulin: Secondary | ICD-10-CM | POA: Diagnosis not present

## 2019-02-23 DIAGNOSIS — D509 Iron deficiency anemia, unspecified: Secondary | ICD-10-CM | POA: Diagnosis not present

## 2019-02-23 DIAGNOSIS — E1122 Type 2 diabetes mellitus with diabetic chronic kidney disease: Secondary | ICD-10-CM | POA: Diagnosis not present

## 2019-02-23 DIAGNOSIS — R51 Headache: Secondary | ICD-10-CM | POA: Diagnosis not present

## 2019-02-23 DIAGNOSIS — N186 End stage renal disease: Secondary | ICD-10-CM | POA: Diagnosis not present

## 2019-02-23 DIAGNOSIS — R252 Cramp and spasm: Secondary | ICD-10-CM | POA: Diagnosis not present

## 2019-02-23 DIAGNOSIS — I953 Hypotension of hemodialysis: Secondary | ICD-10-CM | POA: Diagnosis not present

## 2019-02-23 DIAGNOSIS — Z992 Dependence on renal dialysis: Secondary | ICD-10-CM | POA: Diagnosis not present

## 2019-02-24 DIAGNOSIS — Z794 Long term (current) use of insulin: Secondary | ICD-10-CM | POA: Diagnosis not present

## 2019-02-24 DIAGNOSIS — N186 End stage renal disease: Secondary | ICD-10-CM | POA: Diagnosis not present

## 2019-02-24 DIAGNOSIS — R51 Headache: Secondary | ICD-10-CM | POA: Diagnosis not present

## 2019-02-24 DIAGNOSIS — D509 Iron deficiency anemia, unspecified: Secondary | ICD-10-CM | POA: Diagnosis not present

## 2019-02-24 DIAGNOSIS — R252 Cramp and spasm: Secondary | ICD-10-CM | POA: Diagnosis not present

## 2019-02-24 DIAGNOSIS — Z992 Dependence on renal dialysis: Secondary | ICD-10-CM | POA: Diagnosis not present

## 2019-02-25 DIAGNOSIS — Z794 Long term (current) use of insulin: Secondary | ICD-10-CM | POA: Diagnosis not present

## 2019-02-25 DIAGNOSIS — N186 End stage renal disease: Secondary | ICD-10-CM | POA: Diagnosis not present

## 2019-02-25 DIAGNOSIS — R51 Headache: Secondary | ICD-10-CM | POA: Diagnosis not present

## 2019-02-25 DIAGNOSIS — Z992 Dependence on renal dialysis: Secondary | ICD-10-CM | POA: Diagnosis not present

## 2019-02-25 DIAGNOSIS — D509 Iron deficiency anemia, unspecified: Secondary | ICD-10-CM | POA: Diagnosis not present

## 2019-02-25 DIAGNOSIS — R252 Cramp and spasm: Secondary | ICD-10-CM | POA: Diagnosis not present

## 2019-02-28 DIAGNOSIS — N186 End stage renal disease: Secondary | ICD-10-CM | POA: Diagnosis not present

## 2019-02-28 DIAGNOSIS — Z794 Long term (current) use of insulin: Secondary | ICD-10-CM | POA: Diagnosis not present

## 2019-02-28 DIAGNOSIS — R252 Cramp and spasm: Secondary | ICD-10-CM | POA: Diagnosis not present

## 2019-02-28 DIAGNOSIS — D509 Iron deficiency anemia, unspecified: Secondary | ICD-10-CM | POA: Diagnosis not present

## 2019-02-28 DIAGNOSIS — Z992 Dependence on renal dialysis: Secondary | ICD-10-CM | POA: Diagnosis not present

## 2019-02-28 DIAGNOSIS — R51 Headache: Secondary | ICD-10-CM | POA: Diagnosis not present

## 2019-03-02 DIAGNOSIS — R252 Cramp and spasm: Secondary | ICD-10-CM | POA: Diagnosis not present

## 2019-03-02 DIAGNOSIS — R51 Headache: Secondary | ICD-10-CM | POA: Diagnosis not present

## 2019-03-02 DIAGNOSIS — N186 End stage renal disease: Secondary | ICD-10-CM | POA: Diagnosis not present

## 2019-03-02 DIAGNOSIS — D509 Iron deficiency anemia, unspecified: Secondary | ICD-10-CM | POA: Diagnosis not present

## 2019-03-02 DIAGNOSIS — Z992 Dependence on renal dialysis: Secondary | ICD-10-CM | POA: Diagnosis not present

## 2019-03-02 DIAGNOSIS — Z794 Long term (current) use of insulin: Secondary | ICD-10-CM | POA: Diagnosis not present

## 2019-03-03 DIAGNOSIS — R51 Headache: Secondary | ICD-10-CM | POA: Diagnosis not present

## 2019-03-03 DIAGNOSIS — N186 End stage renal disease: Secondary | ICD-10-CM | POA: Diagnosis not present

## 2019-03-03 DIAGNOSIS — R252 Cramp and spasm: Secondary | ICD-10-CM | POA: Diagnosis not present

## 2019-03-03 DIAGNOSIS — D509 Iron deficiency anemia, unspecified: Secondary | ICD-10-CM | POA: Diagnosis not present

## 2019-03-03 DIAGNOSIS — Z794 Long term (current) use of insulin: Secondary | ICD-10-CM | POA: Diagnosis not present

## 2019-03-03 DIAGNOSIS — Z992 Dependence on renal dialysis: Secondary | ICD-10-CM | POA: Diagnosis not present

## 2019-03-04 DIAGNOSIS — Z794 Long term (current) use of insulin: Secondary | ICD-10-CM | POA: Diagnosis not present

## 2019-03-04 DIAGNOSIS — Z992 Dependence on renal dialysis: Secondary | ICD-10-CM | POA: Diagnosis not present

## 2019-03-04 DIAGNOSIS — D509 Iron deficiency anemia, unspecified: Secondary | ICD-10-CM | POA: Diagnosis not present

## 2019-03-04 DIAGNOSIS — R252 Cramp and spasm: Secondary | ICD-10-CM | POA: Diagnosis not present

## 2019-03-04 DIAGNOSIS — N186 End stage renal disease: Secondary | ICD-10-CM | POA: Diagnosis not present

## 2019-03-04 DIAGNOSIS — R51 Headache: Secondary | ICD-10-CM | POA: Diagnosis not present

## 2019-03-07 DIAGNOSIS — Z992 Dependence on renal dialysis: Secondary | ICD-10-CM | POA: Diagnosis not present

## 2019-03-07 DIAGNOSIS — R51 Headache: Secondary | ICD-10-CM | POA: Diagnosis not present

## 2019-03-07 DIAGNOSIS — D509 Iron deficiency anemia, unspecified: Secondary | ICD-10-CM | POA: Diagnosis not present

## 2019-03-07 DIAGNOSIS — Z794 Long term (current) use of insulin: Secondary | ICD-10-CM | POA: Diagnosis not present

## 2019-03-07 DIAGNOSIS — R252 Cramp and spasm: Secondary | ICD-10-CM | POA: Diagnosis not present

## 2019-03-07 DIAGNOSIS — N186 End stage renal disease: Secondary | ICD-10-CM | POA: Diagnosis not present

## 2019-03-09 DIAGNOSIS — Z992 Dependence on renal dialysis: Secondary | ICD-10-CM | POA: Diagnosis not present

## 2019-03-09 DIAGNOSIS — Z794 Long term (current) use of insulin: Secondary | ICD-10-CM | POA: Diagnosis not present

## 2019-03-09 DIAGNOSIS — N186 End stage renal disease: Secondary | ICD-10-CM | POA: Diagnosis not present

## 2019-03-09 DIAGNOSIS — D509 Iron deficiency anemia, unspecified: Secondary | ICD-10-CM | POA: Diagnosis not present

## 2019-03-09 DIAGNOSIS — R51 Headache: Secondary | ICD-10-CM | POA: Diagnosis not present

## 2019-03-09 DIAGNOSIS — R252 Cramp and spasm: Secondary | ICD-10-CM | POA: Diagnosis not present

## 2019-03-10 DIAGNOSIS — Z794 Long term (current) use of insulin: Secondary | ICD-10-CM | POA: Diagnosis not present

## 2019-03-10 DIAGNOSIS — N186 End stage renal disease: Secondary | ICD-10-CM | POA: Diagnosis not present

## 2019-03-10 DIAGNOSIS — D509 Iron deficiency anemia, unspecified: Secondary | ICD-10-CM | POA: Diagnosis not present

## 2019-03-10 DIAGNOSIS — Z992 Dependence on renal dialysis: Secondary | ICD-10-CM | POA: Diagnosis not present

## 2019-03-10 DIAGNOSIS — R252 Cramp and spasm: Secondary | ICD-10-CM | POA: Diagnosis not present

## 2019-03-10 DIAGNOSIS — R51 Headache: Secondary | ICD-10-CM | POA: Diagnosis not present

## 2019-03-11 DIAGNOSIS — Z794 Long term (current) use of insulin: Secondary | ICD-10-CM | POA: Diagnosis not present

## 2019-03-11 DIAGNOSIS — D509 Iron deficiency anemia, unspecified: Secondary | ICD-10-CM | POA: Diagnosis not present

## 2019-03-11 DIAGNOSIS — N186 End stage renal disease: Secondary | ICD-10-CM | POA: Diagnosis not present

## 2019-03-11 DIAGNOSIS — R51 Headache: Secondary | ICD-10-CM | POA: Diagnosis not present

## 2019-03-11 DIAGNOSIS — R252 Cramp and spasm: Secondary | ICD-10-CM | POA: Diagnosis not present

## 2019-03-11 DIAGNOSIS — Z992 Dependence on renal dialysis: Secondary | ICD-10-CM | POA: Diagnosis not present

## 2019-03-14 DIAGNOSIS — R51 Headache: Secondary | ICD-10-CM | POA: Diagnosis not present

## 2019-03-14 DIAGNOSIS — D509 Iron deficiency anemia, unspecified: Secondary | ICD-10-CM | POA: Diagnosis not present

## 2019-03-14 DIAGNOSIS — Z794 Long term (current) use of insulin: Secondary | ICD-10-CM | POA: Diagnosis not present

## 2019-03-14 DIAGNOSIS — N186 End stage renal disease: Secondary | ICD-10-CM | POA: Diagnosis not present

## 2019-03-14 DIAGNOSIS — R252 Cramp and spasm: Secondary | ICD-10-CM | POA: Diagnosis not present

## 2019-03-14 DIAGNOSIS — Z992 Dependence on renal dialysis: Secondary | ICD-10-CM | POA: Diagnosis not present

## 2019-03-16 DIAGNOSIS — R252 Cramp and spasm: Secondary | ICD-10-CM | POA: Diagnosis not present

## 2019-03-16 DIAGNOSIS — Z992 Dependence on renal dialysis: Secondary | ICD-10-CM | POA: Diagnosis not present

## 2019-03-16 DIAGNOSIS — Z794 Long term (current) use of insulin: Secondary | ICD-10-CM | POA: Diagnosis not present

## 2019-03-16 DIAGNOSIS — N186 End stage renal disease: Secondary | ICD-10-CM | POA: Diagnosis not present

## 2019-03-16 DIAGNOSIS — D509 Iron deficiency anemia, unspecified: Secondary | ICD-10-CM | POA: Diagnosis not present

## 2019-03-16 DIAGNOSIS — R51 Headache: Secondary | ICD-10-CM | POA: Diagnosis not present

## 2019-03-17 DIAGNOSIS — Z794 Long term (current) use of insulin: Secondary | ICD-10-CM | POA: Diagnosis not present

## 2019-03-17 DIAGNOSIS — D509 Iron deficiency anemia, unspecified: Secondary | ICD-10-CM | POA: Diagnosis not present

## 2019-03-17 DIAGNOSIS — R51 Headache: Secondary | ICD-10-CM | POA: Diagnosis not present

## 2019-03-17 DIAGNOSIS — N186 End stage renal disease: Secondary | ICD-10-CM | POA: Diagnosis not present

## 2019-03-17 DIAGNOSIS — Z992 Dependence on renal dialysis: Secondary | ICD-10-CM | POA: Diagnosis not present

## 2019-03-17 DIAGNOSIS — R252 Cramp and spasm: Secondary | ICD-10-CM | POA: Diagnosis not present

## 2019-03-18 DIAGNOSIS — D509 Iron deficiency anemia, unspecified: Secondary | ICD-10-CM | POA: Diagnosis not present

## 2019-03-18 DIAGNOSIS — Z794 Long term (current) use of insulin: Secondary | ICD-10-CM | POA: Diagnosis not present

## 2019-03-18 DIAGNOSIS — N186 End stage renal disease: Secondary | ICD-10-CM | POA: Diagnosis not present

## 2019-03-18 DIAGNOSIS — R51 Headache: Secondary | ICD-10-CM | POA: Diagnosis not present

## 2019-03-18 DIAGNOSIS — R252 Cramp and spasm: Secondary | ICD-10-CM | POA: Diagnosis not present

## 2019-03-18 DIAGNOSIS — Z992 Dependence on renal dialysis: Secondary | ICD-10-CM | POA: Diagnosis not present

## 2019-03-21 DIAGNOSIS — D509 Iron deficiency anemia, unspecified: Secondary | ICD-10-CM | POA: Diagnosis not present

## 2019-03-21 DIAGNOSIS — Z794 Long term (current) use of insulin: Secondary | ICD-10-CM | POA: Diagnosis not present

## 2019-03-21 DIAGNOSIS — N186 End stage renal disease: Secondary | ICD-10-CM | POA: Diagnosis not present

## 2019-03-21 DIAGNOSIS — R252 Cramp and spasm: Secondary | ICD-10-CM | POA: Diagnosis not present

## 2019-03-21 DIAGNOSIS — R51 Headache: Secondary | ICD-10-CM | POA: Diagnosis not present

## 2019-03-21 DIAGNOSIS — Z992 Dependence on renal dialysis: Secondary | ICD-10-CM | POA: Diagnosis not present

## 2019-03-23 DIAGNOSIS — N186 End stage renal disease: Secondary | ICD-10-CM | POA: Diagnosis not present

## 2019-03-23 DIAGNOSIS — Z992 Dependence on renal dialysis: Secondary | ICD-10-CM | POA: Diagnosis not present

## 2019-03-23 DIAGNOSIS — Z794 Long term (current) use of insulin: Secondary | ICD-10-CM | POA: Diagnosis not present

## 2019-03-23 DIAGNOSIS — R252 Cramp and spasm: Secondary | ICD-10-CM | POA: Diagnosis not present

## 2019-03-23 DIAGNOSIS — R51 Headache: Secondary | ICD-10-CM | POA: Diagnosis not present

## 2019-03-23 DIAGNOSIS — D509 Iron deficiency anemia, unspecified: Secondary | ICD-10-CM | POA: Diagnosis not present

## 2019-03-24 DIAGNOSIS — E1122 Type 2 diabetes mellitus with diabetic chronic kidney disease: Secondary | ICD-10-CM | POA: Diagnosis not present

## 2019-03-24 DIAGNOSIS — N2581 Secondary hyperparathyroidism of renal origin: Secondary | ICD-10-CM | POA: Diagnosis not present

## 2019-03-24 DIAGNOSIS — N186 End stage renal disease: Secondary | ICD-10-CM | POA: Diagnosis not present

## 2019-03-24 DIAGNOSIS — I953 Hypotension of hemodialysis: Secondary | ICD-10-CM | POA: Diagnosis not present

## 2019-03-24 DIAGNOSIS — E1129 Type 2 diabetes mellitus with other diabetic kidney complication: Secondary | ICD-10-CM | POA: Diagnosis not present

## 2019-03-24 DIAGNOSIS — D509 Iron deficiency anemia, unspecified: Secondary | ICD-10-CM | POA: Diagnosis not present

## 2019-03-24 DIAGNOSIS — Z23 Encounter for immunization: Secondary | ICD-10-CM | POA: Diagnosis not present

## 2019-03-24 DIAGNOSIS — R519 Headache, unspecified: Secondary | ICD-10-CM | POA: Diagnosis not present

## 2019-03-24 DIAGNOSIS — Z794 Long term (current) use of insulin: Secondary | ICD-10-CM | POA: Diagnosis not present

## 2019-03-24 DIAGNOSIS — R252 Cramp and spasm: Secondary | ICD-10-CM | POA: Diagnosis not present

## 2019-03-24 DIAGNOSIS — Z992 Dependence on renal dialysis: Secondary | ICD-10-CM | POA: Diagnosis not present

## 2019-03-24 DIAGNOSIS — M109 Gout, unspecified: Secondary | ICD-10-CM | POA: Diagnosis not present

## 2019-03-24 DIAGNOSIS — T8610 Unspecified complication of kidney transplant: Secondary | ICD-10-CM | POA: Diagnosis not present

## 2019-03-24 DIAGNOSIS — I120 Hypertensive chronic kidney disease with stage 5 chronic kidney disease or end stage renal disease: Secondary | ICD-10-CM | POA: Diagnosis not present

## 2019-03-25 DIAGNOSIS — N186 End stage renal disease: Secondary | ICD-10-CM | POA: Diagnosis not present

## 2019-03-25 DIAGNOSIS — Z992 Dependence on renal dialysis: Secondary | ICD-10-CM | POA: Diagnosis not present

## 2019-03-25 DIAGNOSIS — N2581 Secondary hyperparathyroidism of renal origin: Secondary | ICD-10-CM | POA: Diagnosis not present

## 2019-03-25 DIAGNOSIS — Z23 Encounter for immunization: Secondary | ICD-10-CM | POA: Diagnosis not present

## 2019-03-25 DIAGNOSIS — D509 Iron deficiency anemia, unspecified: Secondary | ICD-10-CM | POA: Diagnosis not present

## 2019-03-25 DIAGNOSIS — Z794 Long term (current) use of insulin: Secondary | ICD-10-CM | POA: Diagnosis not present

## 2019-03-28 DIAGNOSIS — Z23 Encounter for immunization: Secondary | ICD-10-CM | POA: Diagnosis not present

## 2019-03-28 DIAGNOSIS — N186 End stage renal disease: Secondary | ICD-10-CM | POA: Diagnosis not present

## 2019-03-28 DIAGNOSIS — N2581 Secondary hyperparathyroidism of renal origin: Secondary | ICD-10-CM | POA: Diagnosis not present

## 2019-03-28 DIAGNOSIS — D509 Iron deficiency anemia, unspecified: Secondary | ICD-10-CM | POA: Diagnosis not present

## 2019-03-28 DIAGNOSIS — Z794 Long term (current) use of insulin: Secondary | ICD-10-CM | POA: Diagnosis not present

## 2019-03-28 DIAGNOSIS — Z992 Dependence on renal dialysis: Secondary | ICD-10-CM | POA: Diagnosis not present

## 2019-03-29 DIAGNOSIS — N2581 Secondary hyperparathyroidism of renal origin: Secondary | ICD-10-CM | POA: Diagnosis not present

## 2019-03-29 DIAGNOSIS — Z23 Encounter for immunization: Secondary | ICD-10-CM | POA: Diagnosis not present

## 2019-03-29 DIAGNOSIS — Z794 Long term (current) use of insulin: Secondary | ICD-10-CM | POA: Diagnosis not present

## 2019-03-29 DIAGNOSIS — Z992 Dependence on renal dialysis: Secondary | ICD-10-CM | POA: Diagnosis not present

## 2019-03-29 DIAGNOSIS — N186 End stage renal disease: Secondary | ICD-10-CM | POA: Diagnosis not present

## 2019-03-29 DIAGNOSIS — D509 Iron deficiency anemia, unspecified: Secondary | ICD-10-CM | POA: Diagnosis not present

## 2019-03-30 DIAGNOSIS — Z992 Dependence on renal dialysis: Secondary | ICD-10-CM | POA: Diagnosis not present

## 2019-03-30 DIAGNOSIS — N2581 Secondary hyperparathyroidism of renal origin: Secondary | ICD-10-CM | POA: Diagnosis not present

## 2019-03-30 DIAGNOSIS — Z794 Long term (current) use of insulin: Secondary | ICD-10-CM | POA: Diagnosis not present

## 2019-03-30 DIAGNOSIS — D509 Iron deficiency anemia, unspecified: Secondary | ICD-10-CM | POA: Diagnosis not present

## 2019-03-30 DIAGNOSIS — Z23 Encounter for immunization: Secondary | ICD-10-CM | POA: Diagnosis not present

## 2019-03-30 DIAGNOSIS — N186 End stage renal disease: Secondary | ICD-10-CM | POA: Diagnosis not present

## 2019-03-31 DIAGNOSIS — Z23 Encounter for immunization: Secondary | ICD-10-CM | POA: Diagnosis not present

## 2019-03-31 DIAGNOSIS — N2581 Secondary hyperparathyroidism of renal origin: Secondary | ICD-10-CM | POA: Diagnosis not present

## 2019-03-31 DIAGNOSIS — N186 End stage renal disease: Secondary | ICD-10-CM | POA: Diagnosis not present

## 2019-03-31 DIAGNOSIS — Z794 Long term (current) use of insulin: Secondary | ICD-10-CM | POA: Diagnosis not present

## 2019-03-31 DIAGNOSIS — Z992 Dependence on renal dialysis: Secondary | ICD-10-CM | POA: Diagnosis not present

## 2019-03-31 DIAGNOSIS — D509 Iron deficiency anemia, unspecified: Secondary | ICD-10-CM | POA: Diagnosis not present

## 2019-04-01 DIAGNOSIS — D509 Iron deficiency anemia, unspecified: Secondary | ICD-10-CM | POA: Diagnosis not present

## 2019-04-01 DIAGNOSIS — Z794 Long term (current) use of insulin: Secondary | ICD-10-CM | POA: Diagnosis not present

## 2019-04-01 DIAGNOSIS — N2581 Secondary hyperparathyroidism of renal origin: Secondary | ICD-10-CM | POA: Diagnosis not present

## 2019-04-01 DIAGNOSIS — Z23 Encounter for immunization: Secondary | ICD-10-CM | POA: Diagnosis not present

## 2019-04-01 DIAGNOSIS — N186 End stage renal disease: Secondary | ICD-10-CM | POA: Diagnosis not present

## 2019-04-01 DIAGNOSIS — Z992 Dependence on renal dialysis: Secondary | ICD-10-CM | POA: Diagnosis not present

## 2019-04-04 DIAGNOSIS — N186 End stage renal disease: Secondary | ICD-10-CM | POA: Diagnosis not present

## 2019-04-04 DIAGNOSIS — N2581 Secondary hyperparathyroidism of renal origin: Secondary | ICD-10-CM | POA: Diagnosis not present

## 2019-04-04 DIAGNOSIS — Z992 Dependence on renal dialysis: Secondary | ICD-10-CM | POA: Diagnosis not present

## 2019-04-04 DIAGNOSIS — D509 Iron deficiency anemia, unspecified: Secondary | ICD-10-CM | POA: Diagnosis not present

## 2019-04-04 DIAGNOSIS — Z23 Encounter for immunization: Secondary | ICD-10-CM | POA: Diagnosis not present

## 2019-04-04 DIAGNOSIS — Z794 Long term (current) use of insulin: Secondary | ICD-10-CM | POA: Diagnosis not present

## 2019-04-06 DIAGNOSIS — Z992 Dependence on renal dialysis: Secondary | ICD-10-CM | POA: Diagnosis not present

## 2019-04-06 DIAGNOSIS — Z23 Encounter for immunization: Secondary | ICD-10-CM | POA: Diagnosis not present

## 2019-04-06 DIAGNOSIS — D509 Iron deficiency anemia, unspecified: Secondary | ICD-10-CM | POA: Diagnosis not present

## 2019-04-06 DIAGNOSIS — Z794 Long term (current) use of insulin: Secondary | ICD-10-CM | POA: Diagnosis not present

## 2019-04-06 DIAGNOSIS — N186 End stage renal disease: Secondary | ICD-10-CM | POA: Diagnosis not present

## 2019-04-06 DIAGNOSIS — N2581 Secondary hyperparathyroidism of renal origin: Secondary | ICD-10-CM | POA: Diagnosis not present

## 2019-04-07 DIAGNOSIS — N186 End stage renal disease: Secondary | ICD-10-CM | POA: Diagnosis not present

## 2019-04-07 DIAGNOSIS — Z992 Dependence on renal dialysis: Secondary | ICD-10-CM | POA: Diagnosis not present

## 2019-04-07 DIAGNOSIS — D509 Iron deficiency anemia, unspecified: Secondary | ICD-10-CM | POA: Diagnosis not present

## 2019-04-07 DIAGNOSIS — N2581 Secondary hyperparathyroidism of renal origin: Secondary | ICD-10-CM | POA: Diagnosis not present

## 2019-04-07 DIAGNOSIS — Z23 Encounter for immunization: Secondary | ICD-10-CM | POA: Diagnosis not present

## 2019-04-07 DIAGNOSIS — Z794 Long term (current) use of insulin: Secondary | ICD-10-CM | POA: Diagnosis not present

## 2019-04-08 DIAGNOSIS — Z23 Encounter for immunization: Secondary | ICD-10-CM | POA: Diagnosis not present

## 2019-04-08 DIAGNOSIS — Z794 Long term (current) use of insulin: Secondary | ICD-10-CM | POA: Diagnosis not present

## 2019-04-08 DIAGNOSIS — N186 End stage renal disease: Secondary | ICD-10-CM | POA: Diagnosis not present

## 2019-04-08 DIAGNOSIS — Z992 Dependence on renal dialysis: Secondary | ICD-10-CM | POA: Diagnosis not present

## 2019-04-08 DIAGNOSIS — N2581 Secondary hyperparathyroidism of renal origin: Secondary | ICD-10-CM | POA: Diagnosis not present

## 2019-04-08 DIAGNOSIS — D509 Iron deficiency anemia, unspecified: Secondary | ICD-10-CM | POA: Diagnosis not present

## 2019-04-11 DIAGNOSIS — N2581 Secondary hyperparathyroidism of renal origin: Secondary | ICD-10-CM | POA: Diagnosis not present

## 2019-04-11 DIAGNOSIS — Z992 Dependence on renal dialysis: Secondary | ICD-10-CM | POA: Diagnosis not present

## 2019-04-11 DIAGNOSIS — Z794 Long term (current) use of insulin: Secondary | ICD-10-CM | POA: Diagnosis not present

## 2019-04-11 DIAGNOSIS — Z23 Encounter for immunization: Secondary | ICD-10-CM | POA: Diagnosis not present

## 2019-04-11 DIAGNOSIS — D509 Iron deficiency anemia, unspecified: Secondary | ICD-10-CM | POA: Diagnosis not present

## 2019-04-11 DIAGNOSIS — N186 End stage renal disease: Secondary | ICD-10-CM | POA: Diagnosis not present

## 2019-04-13 DIAGNOSIS — Z794 Long term (current) use of insulin: Secondary | ICD-10-CM | POA: Diagnosis not present

## 2019-04-13 DIAGNOSIS — D509 Iron deficiency anemia, unspecified: Secondary | ICD-10-CM | POA: Diagnosis not present

## 2019-04-13 DIAGNOSIS — N2581 Secondary hyperparathyroidism of renal origin: Secondary | ICD-10-CM | POA: Diagnosis not present

## 2019-04-13 DIAGNOSIS — N186 End stage renal disease: Secondary | ICD-10-CM | POA: Diagnosis not present

## 2019-04-13 DIAGNOSIS — Z992 Dependence on renal dialysis: Secondary | ICD-10-CM | POA: Diagnosis not present

## 2019-04-13 DIAGNOSIS — Z23 Encounter for immunization: Secondary | ICD-10-CM | POA: Diagnosis not present

## 2019-04-14 DIAGNOSIS — Z794 Long term (current) use of insulin: Secondary | ICD-10-CM | POA: Diagnosis not present

## 2019-04-14 DIAGNOSIS — N2581 Secondary hyperparathyroidism of renal origin: Secondary | ICD-10-CM | POA: Diagnosis not present

## 2019-04-14 DIAGNOSIS — Z992 Dependence on renal dialysis: Secondary | ICD-10-CM | POA: Diagnosis not present

## 2019-04-14 DIAGNOSIS — Z23 Encounter for immunization: Secondary | ICD-10-CM | POA: Diagnosis not present

## 2019-04-14 DIAGNOSIS — D509 Iron deficiency anemia, unspecified: Secondary | ICD-10-CM | POA: Diagnosis not present

## 2019-04-14 DIAGNOSIS — N186 End stage renal disease: Secondary | ICD-10-CM | POA: Diagnosis not present

## 2019-04-15 DIAGNOSIS — N186 End stage renal disease: Secondary | ICD-10-CM | POA: Diagnosis not present

## 2019-04-15 DIAGNOSIS — D509 Iron deficiency anemia, unspecified: Secondary | ICD-10-CM | POA: Diagnosis not present

## 2019-04-15 DIAGNOSIS — N2581 Secondary hyperparathyroidism of renal origin: Secondary | ICD-10-CM | POA: Diagnosis not present

## 2019-04-15 DIAGNOSIS — Z23 Encounter for immunization: Secondary | ICD-10-CM | POA: Diagnosis not present

## 2019-04-15 DIAGNOSIS — Z992 Dependence on renal dialysis: Secondary | ICD-10-CM | POA: Diagnosis not present

## 2019-04-15 DIAGNOSIS — Z794 Long term (current) use of insulin: Secondary | ICD-10-CM | POA: Diagnosis not present

## 2019-04-18 DIAGNOSIS — N186 End stage renal disease: Secondary | ICD-10-CM | POA: Diagnosis not present

## 2019-04-18 DIAGNOSIS — Z794 Long term (current) use of insulin: Secondary | ICD-10-CM | POA: Diagnosis not present

## 2019-04-18 DIAGNOSIS — Z992 Dependence on renal dialysis: Secondary | ICD-10-CM | POA: Diagnosis not present

## 2019-04-18 DIAGNOSIS — Z23 Encounter for immunization: Secondary | ICD-10-CM | POA: Diagnosis not present

## 2019-04-18 DIAGNOSIS — D509 Iron deficiency anemia, unspecified: Secondary | ICD-10-CM | POA: Diagnosis not present

## 2019-04-18 DIAGNOSIS — N2581 Secondary hyperparathyroidism of renal origin: Secondary | ICD-10-CM | POA: Diagnosis not present

## 2019-04-20 DIAGNOSIS — N2581 Secondary hyperparathyroidism of renal origin: Secondary | ICD-10-CM | POA: Diagnosis not present

## 2019-04-20 DIAGNOSIS — N186 End stage renal disease: Secondary | ICD-10-CM | POA: Diagnosis not present

## 2019-04-20 DIAGNOSIS — D509 Iron deficiency anemia, unspecified: Secondary | ICD-10-CM | POA: Diagnosis not present

## 2019-04-20 DIAGNOSIS — Z23 Encounter for immunization: Secondary | ICD-10-CM | POA: Diagnosis not present

## 2019-04-20 DIAGNOSIS — Z992 Dependence on renal dialysis: Secondary | ICD-10-CM | POA: Diagnosis not present

## 2019-04-20 DIAGNOSIS — Z794 Long term (current) use of insulin: Secondary | ICD-10-CM | POA: Diagnosis not present

## 2019-04-21 DIAGNOSIS — N186 End stage renal disease: Secondary | ICD-10-CM | POA: Diagnosis not present

## 2019-04-21 DIAGNOSIS — D509 Iron deficiency anemia, unspecified: Secondary | ICD-10-CM | POA: Diagnosis not present

## 2019-04-21 DIAGNOSIS — Z992 Dependence on renal dialysis: Secondary | ICD-10-CM | POA: Diagnosis not present

## 2019-04-21 DIAGNOSIS — N2581 Secondary hyperparathyroidism of renal origin: Secondary | ICD-10-CM | POA: Diagnosis not present

## 2019-04-21 DIAGNOSIS — Z23 Encounter for immunization: Secondary | ICD-10-CM | POA: Diagnosis not present

## 2019-04-21 DIAGNOSIS — Z794 Long term (current) use of insulin: Secondary | ICD-10-CM | POA: Diagnosis not present

## 2019-04-22 DIAGNOSIS — D509 Iron deficiency anemia, unspecified: Secondary | ICD-10-CM | POA: Diagnosis not present

## 2019-04-22 DIAGNOSIS — Z23 Encounter for immunization: Secondary | ICD-10-CM | POA: Diagnosis not present

## 2019-04-22 DIAGNOSIS — N186 End stage renal disease: Secondary | ICD-10-CM | POA: Diagnosis not present

## 2019-04-22 DIAGNOSIS — N2581 Secondary hyperparathyroidism of renal origin: Secondary | ICD-10-CM | POA: Diagnosis not present

## 2019-04-22 DIAGNOSIS — Z992 Dependence on renal dialysis: Secondary | ICD-10-CM | POA: Diagnosis not present

## 2019-04-22 DIAGNOSIS — Z794 Long term (current) use of insulin: Secondary | ICD-10-CM | POA: Diagnosis not present

## 2019-04-24 DIAGNOSIS — N186 End stage renal disease: Secondary | ICD-10-CM | POA: Diagnosis not present

## 2019-04-24 DIAGNOSIS — T8610 Unspecified complication of kidney transplant: Secondary | ICD-10-CM | POA: Diagnosis not present

## 2019-04-24 DIAGNOSIS — Z992 Dependence on renal dialysis: Secondary | ICD-10-CM | POA: Diagnosis not present

## 2019-04-25 DIAGNOSIS — Z992 Dependence on renal dialysis: Secondary | ICD-10-CM | POA: Diagnosis not present

## 2019-04-25 DIAGNOSIS — I953 Hypotension of hemodialysis: Secondary | ICD-10-CM | POA: Diagnosis not present

## 2019-04-25 DIAGNOSIS — E1122 Type 2 diabetes mellitus with diabetic chronic kidney disease: Secondary | ICD-10-CM | POA: Diagnosis not present

## 2019-04-25 DIAGNOSIS — N186 End stage renal disease: Secondary | ICD-10-CM | POA: Diagnosis not present

## 2019-04-25 DIAGNOSIS — N2581 Secondary hyperparathyroidism of renal origin: Secondary | ICD-10-CM | POA: Diagnosis not present

## 2019-04-25 DIAGNOSIS — R519 Headache, unspecified: Secondary | ICD-10-CM | POA: Diagnosis not present

## 2019-04-25 DIAGNOSIS — R252 Cramp and spasm: Secondary | ICD-10-CM | POA: Diagnosis not present

## 2019-04-25 DIAGNOSIS — Z794 Long term (current) use of insulin: Secondary | ICD-10-CM | POA: Diagnosis not present

## 2019-04-25 DIAGNOSIS — I120 Hypertensive chronic kidney disease with stage 5 chronic kidney disease or end stage renal disease: Secondary | ICD-10-CM | POA: Diagnosis not present

## 2019-04-25 DIAGNOSIS — Z4931 Encounter for adequacy testing for hemodialysis: Secondary | ICD-10-CM | POA: Diagnosis not present

## 2019-04-25 DIAGNOSIS — D509 Iron deficiency anemia, unspecified: Secondary | ICD-10-CM | POA: Diagnosis not present

## 2019-04-27 DIAGNOSIS — Z992 Dependence on renal dialysis: Secondary | ICD-10-CM | POA: Diagnosis not present

## 2019-04-27 DIAGNOSIS — N186 End stage renal disease: Secondary | ICD-10-CM | POA: Diagnosis not present

## 2019-04-27 DIAGNOSIS — D509 Iron deficiency anemia, unspecified: Secondary | ICD-10-CM | POA: Diagnosis not present

## 2019-04-27 DIAGNOSIS — Z4931 Encounter for adequacy testing for hemodialysis: Secondary | ICD-10-CM | POA: Diagnosis not present

## 2019-04-27 DIAGNOSIS — N2581 Secondary hyperparathyroidism of renal origin: Secondary | ICD-10-CM | POA: Diagnosis not present

## 2019-04-27 DIAGNOSIS — Z794 Long term (current) use of insulin: Secondary | ICD-10-CM | POA: Diagnosis not present

## 2019-04-28 DIAGNOSIS — Z992 Dependence on renal dialysis: Secondary | ICD-10-CM | POA: Diagnosis not present

## 2019-04-28 DIAGNOSIS — Z4931 Encounter for adequacy testing for hemodialysis: Secondary | ICD-10-CM | POA: Diagnosis not present

## 2019-04-28 DIAGNOSIS — N2581 Secondary hyperparathyroidism of renal origin: Secondary | ICD-10-CM | POA: Diagnosis not present

## 2019-04-28 DIAGNOSIS — D509 Iron deficiency anemia, unspecified: Secondary | ICD-10-CM | POA: Diagnosis not present

## 2019-04-28 DIAGNOSIS — N186 End stage renal disease: Secondary | ICD-10-CM | POA: Diagnosis not present

## 2019-04-28 DIAGNOSIS — Z794 Long term (current) use of insulin: Secondary | ICD-10-CM | POA: Diagnosis not present

## 2019-04-29 DIAGNOSIS — N186 End stage renal disease: Secondary | ICD-10-CM | POA: Diagnosis not present

## 2019-04-29 DIAGNOSIS — N2581 Secondary hyperparathyroidism of renal origin: Secondary | ICD-10-CM | POA: Diagnosis not present

## 2019-04-29 DIAGNOSIS — Z4931 Encounter for adequacy testing for hemodialysis: Secondary | ICD-10-CM | POA: Diagnosis not present

## 2019-04-29 DIAGNOSIS — Z794 Long term (current) use of insulin: Secondary | ICD-10-CM | POA: Diagnosis not present

## 2019-04-29 DIAGNOSIS — Z992 Dependence on renal dialysis: Secondary | ICD-10-CM | POA: Diagnosis not present

## 2019-04-29 DIAGNOSIS — D509 Iron deficiency anemia, unspecified: Secondary | ICD-10-CM | POA: Diagnosis not present

## 2019-05-02 DIAGNOSIS — N2581 Secondary hyperparathyroidism of renal origin: Secondary | ICD-10-CM | POA: Diagnosis not present

## 2019-05-02 DIAGNOSIS — D509 Iron deficiency anemia, unspecified: Secondary | ICD-10-CM | POA: Diagnosis not present

## 2019-05-02 DIAGNOSIS — Z992 Dependence on renal dialysis: Secondary | ICD-10-CM | POA: Diagnosis not present

## 2019-05-02 DIAGNOSIS — Z4931 Encounter for adequacy testing for hemodialysis: Secondary | ICD-10-CM | POA: Diagnosis not present

## 2019-05-02 DIAGNOSIS — N186 End stage renal disease: Secondary | ICD-10-CM | POA: Diagnosis not present

## 2019-05-02 DIAGNOSIS — Z794 Long term (current) use of insulin: Secondary | ICD-10-CM | POA: Diagnosis not present

## 2019-05-03 DIAGNOSIS — Z794 Long term (current) use of insulin: Secondary | ICD-10-CM | POA: Diagnosis not present

## 2019-05-03 DIAGNOSIS — M109 Gout, unspecified: Secondary | ICD-10-CM | POA: Diagnosis not present

## 2019-05-03 DIAGNOSIS — N2581 Secondary hyperparathyroidism of renal origin: Secondary | ICD-10-CM | POA: Diagnosis not present

## 2019-05-03 DIAGNOSIS — D509 Iron deficiency anemia, unspecified: Secondary | ICD-10-CM | POA: Diagnosis not present

## 2019-05-03 DIAGNOSIS — N186 End stage renal disease: Secondary | ICD-10-CM | POA: Diagnosis not present

## 2019-05-03 DIAGNOSIS — Z992 Dependence on renal dialysis: Secondary | ICD-10-CM | POA: Diagnosis not present

## 2019-05-03 DIAGNOSIS — Z4931 Encounter for adequacy testing for hemodialysis: Secondary | ICD-10-CM | POA: Diagnosis not present

## 2019-05-04 DIAGNOSIS — Z794 Long term (current) use of insulin: Secondary | ICD-10-CM | POA: Diagnosis not present

## 2019-05-04 DIAGNOSIS — D509 Iron deficiency anemia, unspecified: Secondary | ICD-10-CM | POA: Diagnosis not present

## 2019-05-04 DIAGNOSIS — N186 End stage renal disease: Secondary | ICD-10-CM | POA: Diagnosis not present

## 2019-05-04 DIAGNOSIS — Z992 Dependence on renal dialysis: Secondary | ICD-10-CM | POA: Diagnosis not present

## 2019-05-04 DIAGNOSIS — Z4931 Encounter for adequacy testing for hemodialysis: Secondary | ICD-10-CM | POA: Diagnosis not present

## 2019-05-04 DIAGNOSIS — N2581 Secondary hyperparathyroidism of renal origin: Secondary | ICD-10-CM | POA: Diagnosis not present

## 2019-05-05 DIAGNOSIS — Z992 Dependence on renal dialysis: Secondary | ICD-10-CM | POA: Diagnosis not present

## 2019-05-05 DIAGNOSIS — Z4931 Encounter for adequacy testing for hemodialysis: Secondary | ICD-10-CM | POA: Diagnosis not present

## 2019-05-05 DIAGNOSIS — N2581 Secondary hyperparathyroidism of renal origin: Secondary | ICD-10-CM | POA: Diagnosis not present

## 2019-05-05 DIAGNOSIS — D509 Iron deficiency anemia, unspecified: Secondary | ICD-10-CM | POA: Diagnosis not present

## 2019-05-05 DIAGNOSIS — Z794 Long term (current) use of insulin: Secondary | ICD-10-CM | POA: Diagnosis not present

## 2019-05-05 DIAGNOSIS — N186 End stage renal disease: Secondary | ICD-10-CM | POA: Diagnosis not present

## 2019-05-06 DIAGNOSIS — N2581 Secondary hyperparathyroidism of renal origin: Secondary | ICD-10-CM | POA: Diagnosis not present

## 2019-05-06 DIAGNOSIS — D509 Iron deficiency anemia, unspecified: Secondary | ICD-10-CM | POA: Diagnosis not present

## 2019-05-06 DIAGNOSIS — Z4931 Encounter for adequacy testing for hemodialysis: Secondary | ICD-10-CM | POA: Diagnosis not present

## 2019-05-06 DIAGNOSIS — Z992 Dependence on renal dialysis: Secondary | ICD-10-CM | POA: Diagnosis not present

## 2019-05-06 DIAGNOSIS — N186 End stage renal disease: Secondary | ICD-10-CM | POA: Diagnosis not present

## 2019-05-06 DIAGNOSIS — Z794 Long term (current) use of insulin: Secondary | ICD-10-CM | POA: Diagnosis not present

## 2019-05-09 ENCOUNTER — Other Ambulatory Visit: Payer: Self-pay

## 2019-05-09 ENCOUNTER — Encounter: Payer: Self-pay | Admitting: Surgery

## 2019-05-09 ENCOUNTER — Ambulatory Visit (INDEPENDENT_AMBULATORY_CARE_PROVIDER_SITE_OTHER): Payer: Medicare Other | Admitting: Surgery

## 2019-05-09 VITALS — BP 139/72 | HR 61 | Temp 97.3°F | Resp 20 | Ht 66.0 in | Wt 167.0 lb

## 2019-05-09 DIAGNOSIS — N186 End stage renal disease: Secondary | ICD-10-CM | POA: Diagnosis not present

## 2019-05-09 DIAGNOSIS — Z794 Long term (current) use of insulin: Secondary | ICD-10-CM | POA: Diagnosis not present

## 2019-05-09 DIAGNOSIS — Z992 Dependence on renal dialysis: Secondary | ICD-10-CM | POA: Diagnosis not present

## 2019-05-09 DIAGNOSIS — N2581 Secondary hyperparathyroidism of renal origin: Secondary | ICD-10-CM | POA: Diagnosis not present

## 2019-05-09 DIAGNOSIS — Z4931 Encounter for adequacy testing for hemodialysis: Secondary | ICD-10-CM | POA: Diagnosis not present

## 2019-05-09 DIAGNOSIS — D509 Iron deficiency anemia, unspecified: Secondary | ICD-10-CM | POA: Diagnosis not present

## 2019-05-09 NOTE — Progress Notes (Signed)
[facility] [place]  Patient name: Kimberly [name] MRN: [id number] DOB: 06/11/1956 Sex: female   REASON FOR VISIT:    ESRD, aneurysm on access  HISOTRY OF PRESENT ILLNESS:    Kimberly [name] is a 64 y.o. female who had a left upper arm fistula on 08-11-2017 by Dr. Scot Dock. She is on home dialysis.  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  I saw her last year and she was doing fine and we elected to observe this.  She states that her fistula has not increased in size.  She now has a ulcer over one of the aneurysms that has been present for about a month.  She also reports some issues with prolonged bleeding.   PAST MEDICAL HISTORY:   Past Medical History:  Diagnosis Date  . Adenomatous colon polyp 08/2007  . Anemia   . Diabetes mellitus type 2, controlled ([place])    type 2  . Dialysis patient ([place]) 01/2017   M-W-F  . End stage renal disease ([place])     diaylis sunday tuesdays and thurs does at home  . Goiter   . Gout   . Hyperlipidemia   . Hypertension   . Hyperthyroidism   . Kidney disease   . Kidney transplant failure    8 years ago  . Osteopenia   . Pneumonia    2018  . Seasonal allergies   . Vitamin B12 deficiency anemia due to intrinsic factor deficiency      FAMILY HISTORY:   Family History  Problem Relation Age of Onset  . Colon cancer Sister   . Colon cancer Brother   . Heart disease Mother   . Cancer - Other Father        gum to throat    SOCIAL HISTORY:   Social History   Tobacco Use  . Smoking status: Former Smoker    Packs/day: 1.00    Years: 20.00    Pack years: 20.00    Types: Cigarettes    Quit date: 07/09/1997    Years since quitting: 21.8  . Smokeless tobacco: Never Used  Substance Use Topics  . Alcohol use: No    Frequency: Never     ALLERGIES:   No Known Allergies   CURRENT MEDICATIONS:   Current Outpatient Medications  Medication Sig Dispense Refill  . acetaminophen  (TYLENOL) 500 MG tablet Take 500-1,000 mg by mouth every 8 (eight) hours as needed for mild pain or headache (depends on pain if takes 1-2 tablets).    Marland Kitchen amLODipine (NORVASC) 10 MG tablet Take 10 mg by mouth every evening.     . B-D ULTRAFINE III SHORT PEN 31G X 8 MM MISC Use twice daily as directed  0  . calcitRIOL (ROCALTROL) 0.5 MCG capsule Take 3 capsules by mouth daily.    Marland Kitchen docusate sodium (COLACE) 100 MG capsule Take 100 mg by mouth every other day.    . ezetimibe-simvastatin (VYTORIN) 10-20 MG per tablet Take 1 tablet by mouth at bedtime.    . ferric citrate (AURYXIA) 1 GM 210 MG(Fe) tablet Take 420 mg by mouth 3 (three) times daily with meals.    . fluticasone (FLONASE) 50 MCG/ACT nasal spray Place 2 sprays into both nostrils daily.    Marland Kitchen glipiZIDE (GLUCOTROL) 10 MG tablet Take 10 mg by mouth daily.     Marland Kitchen labetalol (NORMODYNE) 200 MG tablet Take 1 tablet (200 mg total) by mouth 2 (two) times daily. (Patient taking differently: Take 200-300  mg by mouth 2 (two) times daily. Takes 1.5 tablet in the morning and 1 tablet in the evening) 60 tablet 0  . omeprazole (PRILOSEC) 40 MG capsule Take 40 mg by mouth daily.  0  . predniSONE (DELTASONE) 5 MG tablet Take 5 mg by mouth daily.    . insulin glargine (LANTUS) 100 UNIT/ML injection Inject 0.1 mLs (10 Units total) into the skin at bedtime for 30 days. 3 mL 0   No current facility-administered medications for this visit.     REVIEW OF SYSTEMS:   [X]  denotes positive finding, [ ]  denotes negative finding Cardiac  Comments:  Chest pain or chest pressure:    Shortness of breath upon exertion:    Short of breath when lying flat:    Irregular heart rhythm:        Vascular    Pain in calf, thigh, or hip brought on by ambulation:    Pain in feet at night that wakes you up from your sleep:     Blood clot in your veins:    Leg swelling:         Pulmonary    Oxygen at home:    Productive cough:     Wheezing:         Neurologic    Sudden  weakness in arms or legs:     Sudden numbness in arms or legs:     Sudden onset of difficulty speaking or slurred speech:    Temporary loss of vision in one eye:     Problems with dizziness:         Gastrointestinal    Blood in stool:     Vomited blood:         Genitourinary    Burning when urinating:     Blood in urine:        Psychiatric    Major depression:         Hematologic    Bleeding problems:    Problems with blood clotting too easily:        Skin    Rashes or ulcers:        Constitutional    Fever or chills:      PHYSICAL EXAM:   Vitals:   05/09/19 0903  BP: 139/72  Pulse: 61  Resp: 20  Temp: (!) 97.3 F (36.3 C)  SpO2: 97%  Weight: 75.8 kg  Height: 5\' 6"  (1.676 m)    GENERAL: The patient is a well-nourished female, in no acute distress. The vital signs are documented above. CARDIAC: There is a regular rate and rhythm.  VASCULAR: Aneurysmal fistula.  Superficial ulceration is noted on the aneurysm closest to the shoulder.  There is minimal skin breakdown on the aneurysm near her antecubital crease PULMONARY: Non-labored respirations ABDOMEN: Soft and non-tender with normal pitched bowel sounds.  MUSCULOSKELETAL: There are no major deformities or cyanosis. NEUROLOGIC: No focal weakness or paresthesias are detected. SKIN: There are no ulcers or rashes noted. PSYCHIATRIC: The patient has a normal affect.  STUDIES:   None  MEDICAL ISSUES:   In an effort to avoid placing a catheter we discussed treating only the aneurysm closest to the shoulder which has the most significant area of skin breakdown.  We discussed plication of this area and leaving the aneurysm near the antecubital crease alone for now.  Because she is having prolonged bleeding I also discussed performing a fistulogram and treating any central stenosis if present.  This is been placed  on the schedule for Dr. Donzetta Matters on Tuesday, November 24 due to her scheduling issues.    Leia Alf, MD, FACS [facility] [phone] Pager [phone]

## 2019-05-11 DIAGNOSIS — Z794 Long term (current) use of insulin: Secondary | ICD-10-CM | POA: Diagnosis not present

## 2019-05-11 DIAGNOSIS — D509 Iron deficiency anemia, unspecified: Secondary | ICD-10-CM | POA: Diagnosis not present

## 2019-05-11 DIAGNOSIS — Z4931 Encounter for adequacy testing for hemodialysis: Secondary | ICD-10-CM | POA: Diagnosis not present

## 2019-05-11 DIAGNOSIS — N2581 Secondary hyperparathyroidism of renal origin: Secondary | ICD-10-CM | POA: Diagnosis not present

## 2019-05-11 DIAGNOSIS — N186 End stage renal disease: Secondary | ICD-10-CM | POA: Diagnosis not present

## 2019-05-11 DIAGNOSIS — Z992 Dependence on renal dialysis: Secondary | ICD-10-CM | POA: Diagnosis not present

## 2019-05-12 DIAGNOSIS — D509 Iron deficiency anemia, unspecified: Secondary | ICD-10-CM | POA: Diagnosis not present

## 2019-05-12 DIAGNOSIS — N2581 Secondary hyperparathyroidism of renal origin: Secondary | ICD-10-CM | POA: Diagnosis not present

## 2019-05-12 DIAGNOSIS — N186 End stage renal disease: Secondary | ICD-10-CM | POA: Diagnosis not present

## 2019-05-12 DIAGNOSIS — Z794 Long term (current) use of insulin: Secondary | ICD-10-CM | POA: Diagnosis not present

## 2019-05-12 DIAGNOSIS — Z992 Dependence on renal dialysis: Secondary | ICD-10-CM | POA: Diagnosis not present

## 2019-05-12 DIAGNOSIS — Z4931 Encounter for adequacy testing for hemodialysis: Secondary | ICD-10-CM | POA: Diagnosis not present

## 2019-05-13 DIAGNOSIS — Z794 Long term (current) use of insulin: Secondary | ICD-10-CM | POA: Diagnosis not present

## 2019-05-13 DIAGNOSIS — Z992 Dependence on renal dialysis: Secondary | ICD-10-CM | POA: Diagnosis not present

## 2019-05-13 DIAGNOSIS — N186 End stage renal disease: Secondary | ICD-10-CM | POA: Diagnosis not present

## 2019-05-13 DIAGNOSIS — N2581 Secondary hyperparathyroidism of renal origin: Secondary | ICD-10-CM | POA: Diagnosis not present

## 2019-05-13 DIAGNOSIS — Z4931 Encounter for adequacy testing for hemodialysis: Secondary | ICD-10-CM | POA: Diagnosis not present

## 2019-05-13 DIAGNOSIS — D509 Iron deficiency anemia, unspecified: Secondary | ICD-10-CM | POA: Diagnosis not present

## 2019-05-16 ENCOUNTER — Other Ambulatory Visit (HOSPITAL_COMMUNITY)
Admission: RE | Admit: 2019-05-16 | Discharge: 2019-05-16 | Disposition: A | Discharge status: Home / Self Care | Payer: Medicare Other | Source: Ambulatory Visit | Attending: Vascular Surgery | Admitting: Vascular Surgery

## 2019-05-16 ENCOUNTER — Other Ambulatory Visit: Payer: Self-pay

## 2019-05-16 ENCOUNTER — Encounter (HOSPITAL_COMMUNITY): Payer: Self-pay | Admitting: *Deleted

## 2019-05-16 DIAGNOSIS — D509 Iron deficiency anemia, unspecified: Secondary | ICD-10-CM | POA: Diagnosis not present

## 2019-05-16 DIAGNOSIS — Z794 Long term (current) use of insulin: Secondary | ICD-10-CM | POA: Diagnosis not present

## 2019-05-16 DIAGNOSIS — N186 End stage renal disease: Secondary | ICD-10-CM | POA: Diagnosis not present

## 2019-05-16 DIAGNOSIS — Z992 Dependence on renal dialysis: Secondary | ICD-10-CM | POA: Diagnosis not present

## 2019-05-16 DIAGNOSIS — Z4931 Encounter for adequacy testing for hemodialysis: Secondary | ICD-10-CM | POA: Diagnosis not present

## 2019-05-16 DIAGNOSIS — Z01812 Encounter for preprocedural laboratory examination: Secondary | ICD-10-CM | POA: Insufficient documentation

## 2019-05-16 DIAGNOSIS — Z20828 Contact with and (suspected) exposure to other viral communicable diseases: Secondary | ICD-10-CM | POA: Insufficient documentation

## 2019-05-16 DIAGNOSIS — N2581 Secondary hyperparathyroidism of renal origin: Secondary | ICD-10-CM | POA: Diagnosis not present

## 2019-05-16 LAB — SARS CORONAVIRUS 2 (TAT 6-24 HRS): SARS Coronavirus 2: NEGATIVE

## 2019-05-16 NOTE — Progress Notes (Signed)
Mrs Gatliff denies chest pain or shortness of breath. Mrs Trew has Covid test today and is in quarantine with her husband. Mrs Lasure does home dialysis 4 times a week.  Mrs Rister has type II disbetes. I instructed patient to take 4 units of Toujes on Tuesday evening. I instructed patient to check CBG after awaking and every 2 hours until arrival  to the hospital.  I Instructed patient if CBG is less than 70 to drink 1/2 cup of a clear juice. Recheck CBG in 15 minutes then call pre- op desk at [phone] for further instructions.

## 2019-05-17 NOTE — Anesthesia Preprocedure Evaluation (Addendum)
Anesthesia Evaluation  Patient identified by MRN, date of birth, ID band Patient awake    Reviewed: Allergy & Precautions, NPO status , Patient's Chart, lab work & pertinent test results  Airway Mallampati: II  TM Distance: >3 FB     Dental   Pulmonary shortness of breath, pneumonia, former smoker,    breath sounds clear to auscultation       Cardiovascular hypertension,  Rhythm:Regular Rate:Normal     Neuro/Psych    GI/Hepatic negative GI ROS, Neg liver ROS,   Endo/Other  diabetesHyperthyroidism   Renal/GU Renal disease     Musculoskeletal   Abdominal   Peds  Hematology   Anesthesia Other Findings   Reproductive/Obstetrics                            Anesthesia Physical Anesthesia Plan  ASA: III  Anesthesia Plan: General   Post-op Pain Management:    Induction: Intravenous  PONV Risk Score and Plan: 3 and Ondansetron and Midazolam  Airway Management Planned: LMA  Additional Equipment:   Intra-op Plan:   Post-operative Plan: Extubation in OR  Informed Consent: I have reviewed the patients History and Physical, chart, labs and discussed the procedure including the risks, benefits and alternatives for the proposed anesthesia with the patient or authorized representative who has indicated his/her understanding and acceptance.     Dental advisory given  Plan Discussed with: CRNA and Anesthesiologist  Anesthesia Plan Comments:        Anesthesia Quick Evaluation

## 2019-05-18 ENCOUNTER — Ambulatory Visit (HOSPITAL_COMMUNITY): Payer: Medicare Other

## 2019-05-18 ENCOUNTER — Ambulatory Visit (HOSPITAL_COMMUNITY): Payer: Medicare Other | Admitting: Certified Registered Nurse Anesthetist

## 2019-05-18 ENCOUNTER — Other Ambulatory Visit: Payer: Self-pay

## 2019-05-18 ENCOUNTER — Encounter (HOSPITAL_COMMUNITY)
Admission: RE | Disposition: A | Discharge status: Home / Self Care | Payer: Self-pay | Source: Home / Self Care | Attending: Vascular Surgery

## 2019-05-18 ENCOUNTER — Encounter (HOSPITAL_COMMUNITY): Payer: Self-pay

## 2019-05-18 ENCOUNTER — Ambulatory Visit (HOSPITAL_COMMUNITY)
Admission: RE | Admit: 2019-05-18 | Discharge: 2019-05-18 | Disposition: A | Discharge status: Home / Self Care | Payer: Medicare Other | Attending: Vascular Surgery | Admitting: Vascular Surgery

## 2019-05-18 DIAGNOSIS — T82898A Other specified complication of vascular prosthetic devices, implants and grafts, initial encounter: Secondary | ICD-10-CM | POA: Insufficient documentation

## 2019-05-18 DIAGNOSIS — Z940 Kidney transplant status: Secondary | ICD-10-CM | POA: Insufficient documentation

## 2019-05-18 DIAGNOSIS — I120 Hypertensive chronic kidney disease with stage 5 chronic kidney disease or end stage renal disease: Secondary | ICD-10-CM | POA: Insufficient documentation

## 2019-05-18 DIAGNOSIS — E1122 Type 2 diabetes mellitus with diabetic chronic kidney disease: Secondary | ICD-10-CM | POA: Diagnosis not present

## 2019-05-18 DIAGNOSIS — Z794 Long term (current) use of insulin: Secondary | ICD-10-CM | POA: Diagnosis not present

## 2019-05-18 DIAGNOSIS — Z79899 Other long term (current) drug therapy: Secondary | ICD-10-CM | POA: Insufficient documentation

## 2019-05-18 DIAGNOSIS — T82858A Stenosis of vascular prosthetic devices, implants and grafts, initial encounter: Secondary | ICD-10-CM | POA: Diagnosis not present

## 2019-05-18 DIAGNOSIS — Z7952 Long term (current) use of systemic steroids: Secondary | ICD-10-CM | POA: Diagnosis not present

## 2019-05-18 DIAGNOSIS — D509 Iron deficiency anemia, unspecified: Secondary | ICD-10-CM | POA: Diagnosis not present

## 2019-05-18 DIAGNOSIS — Z992 Dependence on renal dialysis: Secondary | ICD-10-CM | POA: Insufficient documentation

## 2019-05-18 DIAGNOSIS — T8611 Kidney transplant rejection: Secondary | ICD-10-CM | POA: Diagnosis not present

## 2019-05-18 DIAGNOSIS — T82590A Other mechanical complication of surgically created arteriovenous fistula, initial encounter: Secondary | ICD-10-CM | POA: Diagnosis not present

## 2019-05-18 DIAGNOSIS — E785 Hyperlipidemia, unspecified: Secondary | ICD-10-CM | POA: Insufficient documentation

## 2019-05-18 DIAGNOSIS — Y832 Surgical operation with anastomosis, bypass or graft as the cause of abnormal reaction of the patient, or of later complication, without mention of misadventure at the time of the procedure: Secondary | ICD-10-CM | POA: Insufficient documentation

## 2019-05-18 DIAGNOSIS — Z87891 Personal history of nicotine dependence: Secondary | ICD-10-CM | POA: Insufficient documentation

## 2019-05-18 DIAGNOSIS — Z4931 Encounter for adequacy testing for hemodialysis: Secondary | ICD-10-CM | POA: Diagnosis not present

## 2019-05-18 DIAGNOSIS — N186 End stage renal disease: Secondary | ICD-10-CM | POA: Diagnosis not present

## 2019-05-18 DIAGNOSIS — N2581 Secondary hyperparathyroidism of renal origin: Secondary | ICD-10-CM | POA: Diagnosis not present

## 2019-05-18 HISTORY — PX: FISTULOGRAM: SHX5832

## 2019-05-18 HISTORY — PX: REVISON OF ARTERIOVENOUS FISTULA: SHX6074

## 2019-05-18 LAB — POCT I-STAT, CHEM 8
BUN: 34 mg/dL — ABNORMAL HIGH (ref 8–23)
Calcium, Ion: 1 mmol/L — ABNORMAL LOW (ref 1.15–1.40)
Chloride: 99 mmol/L (ref 98–111)
Creatinine, Ser: 7.9 mg/dL — ABNORMAL HIGH (ref 0.44–1.00)
Glucose, Bld: 103 mg/dL — ABNORMAL HIGH (ref 70–99)
HCT: 33 % — ABNORMAL LOW (ref 36.0–46.0)
Hemoglobin: 11.2 g/dL — ABNORMAL LOW (ref 12.0–15.0)
Potassium: 3.4 mmol/L — ABNORMAL LOW (ref 3.5–5.1)
Sodium: 139 mmol/L (ref 135–145)
TCO2: 26 mmol/L (ref 22–32)

## 2019-05-18 LAB — GLUCOSE, CAPILLARY
Glucose-Capillary: 105 mg/dL — ABNORMAL HIGH (ref 70–99)
Glucose-Capillary: 107 mg/dL — ABNORMAL HIGH (ref 70–99)

## 2019-05-18 SURGERY — FISTULOGRAM
Anesthesia: General | Site: Arm Upper | Laterality: Left

## 2019-05-18 MED ORDER — 0.9 % SODIUM CHLORIDE (POUR BTL) OPTIME
TOPICAL | Status: DC | PRN
  Administered 2019-05-18: 1000 mL

## 2019-05-18 MED ORDER — LIDOCAINE 2% (20 MG/ML) 5 ML SYRINGE
INTRAMUSCULAR | Status: DC | PRN
  Administered 2019-05-18: 60 mg via INTRAVENOUS

## 2019-05-18 MED ORDER — FENTANYL CITRATE (PF) 250 MCG/5ML IJ SOLN
INTRAMUSCULAR | Status: AC
  Filled 2019-05-18: qty 5

## 2019-05-18 MED ORDER — SODIUM CHLORIDE 0.9 % IV SOLN
INTRAVENOUS | Status: AC
  Filled 2019-05-18: qty 1.2

## 2019-05-18 MED ORDER — SODIUM CHLORIDE 0.9 % IV SOLN: INTRAVENOUS | Status: DC

## 2019-05-18 MED ORDER — CEFAZOLIN SODIUM-DEXTROSE 2-4 GM/100ML-% IV SOLN
INTRAVENOUS | Status: AC
  Filled 2019-05-18: qty 100

## 2019-05-18 MED ORDER — CHLORHEXIDINE GLUCONATE 4 % EX LIQD: 60.0000 mL | Freq: Once | CUTANEOUS | Status: DC

## 2019-05-18 MED ORDER — PROPOFOL 10 MG/ML IV BOLUS
INTRAVENOUS | Status: DC | PRN
  Administered 2019-05-18: 160 mg via INTRAVENOUS
  Administered 2019-05-18: 40 mg via INTRAVENOUS

## 2019-05-18 MED ORDER — DEXAMETHASONE SODIUM PHOSPHATE 10 MG/ML IJ SOLN
INTRAMUSCULAR | Status: AC
  Filled 2019-05-18: qty 1

## 2019-05-18 MED ORDER — SODIUM CHLORIDE 0.9 % IV SOLN
INTRAVENOUS | Status: DC | PRN
  Administered 2019-05-18: 500 mL

## 2019-05-18 MED ORDER — FENTANYL CITRATE (PF) 100 MCG/2ML IJ SOLN
25.0000 ug | INTRAMUSCULAR | Status: DC | PRN
  Administered 2019-05-18 (×2): 50 ug via INTRAVENOUS

## 2019-05-18 MED ORDER — LIDOCAINE-EPINEPHRINE 1 %-1:100000 IJ SOLN
INTRAMUSCULAR | Status: AC
  Filled 2019-05-18: qty 1

## 2019-05-18 MED ORDER — ONDANSETRON HCL 4 MG/2ML IJ SOLN
INTRAMUSCULAR | Status: AC
  Filled 2019-05-18: qty 2

## 2019-05-18 MED ORDER — FENTANYL CITRATE (PF) 100 MCG/2ML IJ SOLN
INTRAMUSCULAR | Status: DC | PRN
  Administered 2019-05-18: 25 ug via INTRAVENOUS

## 2019-05-18 MED ORDER — PHENYLEPHRINE 40 MCG/ML (10ML) SYRINGE FOR IV PUSH (FOR BLOOD PRESSURE SUPPORT)
PREFILLED_SYRINGE | INTRAVENOUS | Status: DC | PRN
  Administered 2019-05-18 (×3): 120 ug via INTRAVENOUS
  Administered 2019-05-18: 40 ug via INTRAVENOUS

## 2019-05-18 MED ORDER — ONDANSETRON HCL 4 MG/2ML IJ SOLN
INTRAMUSCULAR | Status: DC | PRN
  Administered 2019-05-18: 4 mg via INTRAVENOUS

## 2019-05-18 MED ORDER — CEFAZOLIN SODIUM-DEXTROSE 2-4 GM/100ML-% IV SOLN
2.0000 g | INTRAVENOUS | Status: AC
  Administered 2019-05-18: 2 g via INTRAVENOUS

## 2019-05-18 MED ORDER — PROPOFOL 10 MG/ML IV BOLUS
INTRAVENOUS | Status: AC
  Filled 2019-05-18: qty 20

## 2019-05-18 MED ORDER — HYDROCODONE-ACETAMINOPHEN 5-325 MG PO TABS: 1.0000 | ORAL_TABLET | Freq: Four times a day (QID) | ORAL | 0 refills | Status: DC | PRN

## 2019-05-18 MED ORDER — LACTATED RINGERS IV SOLN
INTRAVENOUS | Status: DC | PRN
  Administered 2019-05-18: 08:00:00 via INTRAVENOUS

## 2019-05-18 MED ORDER — FENTANYL CITRATE (PF) 100 MCG/2ML IJ SOLN
INTRAMUSCULAR | Status: AC
  Filled 2019-05-18: qty 2

## 2019-05-18 MED ORDER — IODIXANOL 320 MG/ML IV SOLN
INTRAVENOUS | Status: DC | PRN
  Administered 2019-05-18: 50 mL via INTRAVENOUS
  Administered 2019-05-18: 60 mL via INTRAVENOUS

## 2019-05-18 MED ORDER — DEXAMETHASONE SODIUM PHOSPHATE 10 MG/ML IJ SOLN
INTRAMUSCULAR | Status: DC | PRN
  Administered 2019-05-18: 4 mg via INTRAVENOUS

## 2019-05-18 MED ORDER — MIDAZOLAM HCL 5 MG/5ML IJ SOLN
INTRAMUSCULAR | Status: DC | PRN
  Administered 2019-05-18: 2 mg via INTRAVENOUS

## 2019-05-18 MED ORDER — MIDAZOLAM HCL 2 MG/2ML IJ SOLN
INTRAMUSCULAR | Status: AC
  Filled 2019-05-18: qty 2

## 2019-05-18 MED ORDER — LIDOCAINE 2% (20 MG/ML) 5 ML SYRINGE
INTRAMUSCULAR | Status: AC
  Filled 2019-05-18: qty 5

## 2019-05-18 SURGICAL SUPPLY — 53 items
BAG BANDED W/RUBBER/TAPE 36X54 (MISCELLANEOUS) ×3 IMPLANT
BALLN MUSTANG 8.0X40 75 (BALLOONS) ×2
BALLN MUSTANG 8.0X40 75CM (BALLOONS) ×1
BALLOON MUSTANG 8.0X40 75 (BALLOONS) ×1 IMPLANT
BNDG ELASTIC 4X5.8 VLCR STR LF (GAUZE/BANDAGES/DRESSINGS) ×3 IMPLANT
CANISTER SUCT 3000ML PPV (MISCELLANEOUS) ×3 IMPLANT
CATH ANGIO 5F BER2 100CM (CATHETERS) ×3 IMPLANT
CATH BEACON 5 .035 40 KMP TP (CATHETERS) ×1 IMPLANT
CATH BEACON 5 .038 40 KMP TP (CATHETERS) ×2
CHLORAPREP W/TINT 10.5 ML (MISCELLANEOUS) IMPLANT
CLIP VESOCCLUDE MED 6/CT (CLIP) ×6 IMPLANT
CLIP VESOCCLUDE SM WIDE 6/CT (CLIP) ×3 IMPLANT
COVER DOME SNAP 22 D (MISCELLANEOUS) ×3 IMPLANT
COVER WAND RF STERILE (DRAPES) IMPLANT
DERMABOND ADVANCED (GAUZE/BANDAGES/DRESSINGS) ×2
DERMABOND ADVANCED .7 DNX12 (GAUZE/BANDAGES/DRESSINGS) ×1 IMPLANT
DRAPE BRACHIAL (DRAPES) IMPLANT
DRSG MEPILEX BORDER 4X4 (GAUZE/BANDAGES/DRESSINGS) ×3 IMPLANT
DRSG TEGADERM 4X4.75 (GAUZE/BANDAGES/DRESSINGS) IMPLANT
ELECT REM PT RETURN 9FT ADLT (ELECTROSURGICAL)
ELECTRODE REM PT RTRN 9FT ADLT (ELECTROSURGICAL) IMPLANT
GEL ULTRASOUND 8.5O AQUASONIC (MISCELLANEOUS) IMPLANT
GLOVE BIO SURGEON STRL SZ7.5 (GLOVE) ×6 IMPLANT
GOWN STRL REUS W/ TWL LRG LVL3 (GOWN DISPOSABLE) IMPLANT
GOWN STRL REUS W/ TWL XL LVL3 (GOWN DISPOSABLE) IMPLANT
GOWN STRL REUS W/TWL LRG LVL3 (GOWN DISPOSABLE)
GOWN STRL REUS W/TWL XL LVL3 (GOWN DISPOSABLE)
KIT BASIN OR (CUSTOM PROCEDURE TRAY) ×3 IMPLANT
KIT ENCORE 26 ADVANTAGE (KITS) ×3 IMPLANT
KIT TURNOVER KIT B (KITS) ×3 IMPLANT
NEEDLE PERC 18GX7CM (NEEDLE) IMPLANT
NS IRRIG 1000ML POUR BTL (IV SOLUTION) IMPLANT
PACK CV ACCESS (CUSTOM PROCEDURE TRAY) ×3 IMPLANT
PACK ENDO MINOR (CUSTOM PROCEDURE TRAY) IMPLANT
PAD ARMBOARD 7.5X6 YLW CONV (MISCELLANEOUS) ×6 IMPLANT
SET MICROPUNCTURE 5F STIFF (MISCELLANEOUS) ×3 IMPLANT
SHEATH BRITE TIP 7FRX11 (SHEATH) ×3 IMPLANT
SHEATH PINNACLE 5F 10CM (SHEATH) ×3 IMPLANT
SPONGE LAP 18X18 RF (DISPOSABLE) ×3 IMPLANT
STOPCOCK MORSE 400PSI 3WAY (MISCELLANEOUS) ×3 IMPLANT
SUT MNCRL AB 4-0 PS2 18 (SUTURE) ×6 IMPLANT
SUT PROLENE 5 0 C 1 24 (SUTURE) ×3 IMPLANT
SUT PROLENE 6 0 BV (SUTURE) ×3 IMPLANT
SUT VIC AB 3-0 SH 27 (SUTURE) ×6
SUT VIC AB 3-0 SH 27X BRD (SUTURE) ×3 IMPLANT
SYR 10ML LL (SYRINGE) ×9 IMPLANT
SYR 20ML LL LF (SYRINGE) ×3 IMPLANT
SYR CONTROL 10ML LL (SYRINGE) IMPLANT
TOWEL GREEN STERILE (TOWEL DISPOSABLE) ×3 IMPLANT
TUBING CIL FLEX 10 FLL-RA (TUBING) ×3 IMPLANT
UNDERPAD 30X30 (UNDERPADS AND DIAPERS) ×3 IMPLANT
WATER STERILE IRR 1000ML POUR (IV SOLUTION) ×3 IMPLANT
WIRE BENTSON .035X145CM (WIRE) ×3 IMPLANT

## 2019-05-18 NOTE — Progress Notes (Signed)
Pt with a small skin tear to the left FA. No bleeding noted. Allevyn applied to area.  Jacqlyn Larsen, RN

## 2019-05-18 NOTE — H&P (Signed)
   History and Physical Update  The patient was interviewed and re-examined.  The patient's previous History and Physical has been reviewed and is unchanged from recent office visit. Plan for left arm avf revision and fistulogram today in OR.   Stedman [place] C. Donzetta Matters, MD [facility] [place] Office: [phone] Pager: [phone]  05/18/2019, 7:23 AM

## 2019-05-18 NOTE — Anesthesia Postprocedure Evaluation (Signed)
Anesthesia Post Note  Patient: Kimberly [name]  Procedure(s) Performed: LEFT ARM FISTULOGRAM WITH BALLOON ANGIOPLASTY OF LEFT cephalic arch with 28mm balloon (Left Arm Upper) revision Of  left upper arm Arteriovenous Fistula with excision of aneurysm (Left Arm Upper)     Patient location during evaluation: PACU Anesthesia Type: General Level of consciousness: awake Pain management: pain level controlled Vital Signs Assessment: post-procedure vital signs reviewed and stable Respiratory status: spontaneous breathing Cardiovascular status: stable Postop Assessment: no apparent nausea or vomiting Anesthetic complications: no    Last Vitals:  Vitals:   05/18/19 0948 05/18/19 1007  BP: (!) 168/63 (!) 147/63  Pulse: 65 (!) 59  Resp: 16 (!) 9  Temp:    SpO2: 93% 96%    Last Pain:  Vitals:   05/18/19 1030  TempSrc:   PainSc: 2                  [name]

## 2019-05-18 NOTE — Anesthesia Procedure Notes (Signed)
Procedure Name: LMA Insertion Performed by: Codie Krogh H, CRNA Pre-anesthesia Checklist: Patient identified, Emergency Drugs available, Suction available and Patient being monitored Patient Re-evaluated:Patient Re-evaluated prior to induction Oxygen Delivery Method: Circle System Utilized Preoxygenation: Pre-oxygenation with 100% oxygen Induction Type: IV induction LMA: LMA inserted LMA Size: 4.0 Number of attempts: 1 Airway Equipment and Method: Bite block Placement Confirmation: positive ETCO2 Tube secured with: Tape Dental Injury: Teeth and Oropharynx as per pre-operative assessment        

## 2019-05-18 NOTE — Transfer of Care (Signed)
Immediate Anesthesia Transfer of Care Note  Patient: Kimberly [name]  Procedure(s) Performed: LEFT ARM FISTULOGRAM WITH BALLOON ANGIOPLASTY OF LEFT ARM ARTERIOVENOUS FISTULA (Left Arm Upper) Plication Of  left upper arm Arteriovenous Fistula (Left Arm Upper) CENTRAL Venogram (N/A Chest)  Patient Location: PACU  Anesthesia Type:General  Level of Consciousness: awake  Airway & Oxygen Therapy: Patient Spontanous Breathing  Post-op Assessment: Report given to RN and Post -op Vital signs reviewed and stable  Post vital signs: Reviewed and stable  Last Vitals:  Vitals Value Taken Time  BP 163/72 05/18/19 0933  Temp    Pulse 65 05/18/19 0933  Resp 17 05/18/19 0933  SpO2 93 % 05/18/19 0933  Vitals shown include unvalidated device data.  Last Pain:  Vitals:   05/18/19 0624  TempSrc:   PainSc: 0-No pain      Patients Stated Pain Goal: 3 (16/24/46 9507)  Complications: No apparent anesthesia complications

## 2019-05-18 NOTE — Op Note (Signed)
    Patient name: Kimberly [name] MRN: [id number] DOB: [birth year] Sex: female  05/18/2019 Pre-operative Diagnosis: esrd, ulceration of left arm av fistula Post-operative diagnosis:  Same Surgeon:  Eda Paschal. Donzetta Matters, MD Assistant: Arlee Muslim, PA Procedure Performed: 1.  Left arm fistulogram 2.  Balloon angioplasty of left cephalic arch with 32mm balloon 3.  Revision of left arm av fistula with excision of aneurysm  Indications: 63 year old female on dialysis via left arm fistula.  She does the home dialysis.  She has a very large fistula also has pulsatility in it.  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  She is now indicated for revision.  Findings: We performed fistulogram.  There appear to be a tight stenosis at the cephalic arch.  This was ballooned with 8 mm balloon.  Completion was much stronger thrill.  We then revise the fistula by excising the pseudoaneurysm and sending it and and.   Procedure:  The patient was identified in the holding area and taken to the operating room where she is placed by operative general anesthesia induced.  She was to have prepped draped left upper extremity usual fashion antibiotics were administered and a timeout was called.  We began by cannulating the fistula micropuncture needle followed by wire and sheath.  We performed left upper extremity fistulogram.  I then placed a 7 French sheath.  I was able to use catheter and wire and cross the stenosis in the cephalic arch.  I performed balloon angioplasty this with 8 mm balloon.  Completion demonstrated much better flow with improved thrill in the fistula.  I then turned my attention to the pseudoaneurysm.  I made a elliptical type incision around this.  I dissected out the fistula proximally distally to healthy areas and marked them for orientation.  I then clamped the fistula proximally distally and excised the pseudoaneurysm.  I spatulated the distal  end and sewed in an end with 5-0 Prolene suture.  Prior completion anastomosis without flushing all directions.  Upon completion there was a little twist I tacked this down with a Vicryl suture.  I then we performed fistulogram demonstrated brisk flow through the fistula.  Satisfied with this we irrigated the wound.  We closed in layers Vicryl Monocryl.  Dermabond placed to the level of skin.  She is awakened from anesthesia having tolerated procedure without any complication.  All counts were correct at completion.   EBL: 100cc  Contrast: 50cc  [name] C. Donzetta Matters, MD [facility] [place] Office: [phone] Pager: [phone]

## 2019-05-18 NOTE — Discharge Instructions (Signed)
° °  [facility] of  ° °Discharge Instructions ° °AV Fistula or Graft Surgery for Dialysis Access ° °Please refer to the following instructions for your post-procedure care. Your surgeon or physician assistant will discuss any changes with you. ° °Activity ° °You may drive the day following your surgery, if you are comfortable and no longer taking prescription pain medication. Resume full activity as the soreness in your incision resolves. ° °Bathing/Showering ° °You may shower after you go home. Keep your incision dry for 48 hours. Do not soak in a bathtub, hot tub, or swim until the incision heals completely. You may not shower if you have a hemodialysis catheter. ° °Incision Care ° °Clean your incision with mild soap and water after 48 hours. Pat the area dry with a clean towel. You do not need a bandage unless otherwise instructed. Do not apply any ointments or creams to your incision. You may have skin glue on your incision. Do not peel it off. It will come off on its own in about one week. Your arm may swell a bit after surgery. To reduce swelling use pillows to elevate your arm so it is above your heart. Your doctor will tell you if you need to lightly wrap your arm with an ACE bandage. ° °Diet ° °Resume your normal diet. There are not special food restrictions following this procedure. In order to heal from your surgery, it is CRITICAL to get adequate nutrition. Your body requires vitamins, minerals, and protein. Vegetables are the best source of vitamins and minerals. Vegetables also provide the perfect balance of protein. Processed food has little nutritional value, so try to avoid this. ° °Medications ° °Resume taking all of your medications. If your incision is causing pain, you may take over-the counter pain relievers such as acetaminophen (Tylenol). If you were prescribed a stronger pain medication, please be aware these medications can cause nausea and constipation. Prevent  nausea by taking the medication with a snack or meal. Avoid constipation by drinking plenty of fluids and eating foods with high amount of fiber, such as fruits, vegetables, and grains. Do not take Tylenol if you are taking prescription pain medications. ° ° ° ° °Follow up °Your surgeon may want to see you in the office following your access surgery. If so, this will be arranged at the time of your surgery. ° °Please call us immediately for any of the following conditions: ° °Increased pain, redness, drainage (pus) from your incision site °Fever of 101 degrees or higher °Severe or worsening pain at your incision site °Hand pain or numbness. ° °Reduce your risk of vascular disease: ° °Stop smoking. If you would like help, call [facility] at 1-800-QUIT-NOW (1-800-784-8669) or [facility] at 336-586-4000 ° °Manage your cholesterol °Maintain a desired weight °Control your diabetes °Keep your blood pressure down ° °Dialysis ° °It will take several weeks to several months for your new dialysis access to be ready for use. Your surgeon will determine when it is OK to use it. Your nephrologist will continue to direct your dialysis. You can continue to use your Permcath until your new access is ready for use. ° °If you have any questions, please call the office at [phone]. ° °

## 2019-05-19 ENCOUNTER — Encounter (HOSPITAL_COMMUNITY): Payer: Self-pay | Admitting: Vascular Surgery

## 2019-05-19 DIAGNOSIS — Z794 Long term (current) use of insulin: Secondary | ICD-10-CM | POA: Diagnosis not present

## 2019-05-19 DIAGNOSIS — D509 Iron deficiency anemia, unspecified: Secondary | ICD-10-CM | POA: Diagnosis not present

## 2019-05-19 DIAGNOSIS — N186 End stage renal disease: Secondary | ICD-10-CM | POA: Diagnosis not present

## 2019-05-19 DIAGNOSIS — Z4931 Encounter for adequacy testing for hemodialysis: Secondary | ICD-10-CM | POA: Diagnosis not present

## 2019-05-19 DIAGNOSIS — Z992 Dependence on renal dialysis: Secondary | ICD-10-CM | POA: Diagnosis not present

## 2019-05-19 DIAGNOSIS — N2581 Secondary hyperparathyroidism of renal origin: Secondary | ICD-10-CM | POA: Diagnosis not present

## 2019-05-20 DIAGNOSIS — Z4931 Encounter for adequacy testing for hemodialysis: Secondary | ICD-10-CM | POA: Diagnosis not present

## 2019-05-20 DIAGNOSIS — N186 End stage renal disease: Secondary | ICD-10-CM | POA: Diagnosis not present

## 2019-05-20 DIAGNOSIS — D509 Iron deficiency anemia, unspecified: Secondary | ICD-10-CM | POA: Diagnosis not present

## 2019-05-20 DIAGNOSIS — Z794 Long term (current) use of insulin: Secondary | ICD-10-CM | POA: Diagnosis not present

## 2019-05-20 DIAGNOSIS — Z992 Dependence on renal dialysis: Secondary | ICD-10-CM | POA: Diagnosis not present

## 2019-05-20 DIAGNOSIS — N2581 Secondary hyperparathyroidism of renal origin: Secondary | ICD-10-CM | POA: Diagnosis not present

## 2019-05-23 DIAGNOSIS — Z4931 Encounter for adequacy testing for hemodialysis: Secondary | ICD-10-CM | POA: Diagnosis not present

## 2019-05-23 DIAGNOSIS — N186 End stage renal disease: Secondary | ICD-10-CM | POA: Diagnosis not present

## 2019-05-23 DIAGNOSIS — N2581 Secondary hyperparathyroidism of renal origin: Secondary | ICD-10-CM | POA: Diagnosis not present

## 2019-05-23 DIAGNOSIS — D509 Iron deficiency anemia, unspecified: Secondary | ICD-10-CM | POA: Diagnosis not present

## 2019-05-23 DIAGNOSIS — Z992 Dependence on renal dialysis: Secondary | ICD-10-CM | POA: Diagnosis not present

## 2019-05-23 DIAGNOSIS — Z794 Long term (current) use of insulin: Secondary | ICD-10-CM | POA: Diagnosis not present

## 2019-05-24 DIAGNOSIS — N2581 Secondary hyperparathyroidism of renal origin: Secondary | ICD-10-CM | POA: Diagnosis not present

## 2019-05-24 DIAGNOSIS — R519 Headache, unspecified: Secondary | ICD-10-CM | POA: Diagnosis not present

## 2019-05-24 DIAGNOSIS — M109 Gout, unspecified: Secondary | ICD-10-CM | POA: Diagnosis not present

## 2019-05-24 DIAGNOSIS — E1122 Type 2 diabetes mellitus with diabetic chronic kidney disease: Secondary | ICD-10-CM | POA: Diagnosis not present

## 2019-05-24 DIAGNOSIS — I120 Hypertensive chronic kidney disease with stage 5 chronic kidney disease or end stage renal disease: Secondary | ICD-10-CM | POA: Diagnosis not present

## 2019-05-24 DIAGNOSIS — Z794 Long term (current) use of insulin: Secondary | ICD-10-CM | POA: Diagnosis not present

## 2019-05-24 DIAGNOSIS — R252 Cramp and spasm: Secondary | ICD-10-CM | POA: Diagnosis not present

## 2019-05-24 DIAGNOSIS — I953 Hypotension of hemodialysis: Secondary | ICD-10-CM | POA: Diagnosis not present

## 2019-05-24 DIAGNOSIS — N186 End stage renal disease: Secondary | ICD-10-CM | POA: Diagnosis not present

## 2019-05-24 DIAGNOSIS — Z992 Dependence on renal dialysis: Secondary | ICD-10-CM | POA: Diagnosis not present

## 2019-05-24 DIAGNOSIS — D509 Iron deficiency anemia, unspecified: Secondary | ICD-10-CM | POA: Diagnosis not present

## 2019-05-25 DIAGNOSIS — D509 Iron deficiency anemia, unspecified: Secondary | ICD-10-CM | POA: Diagnosis not present

## 2019-05-25 DIAGNOSIS — N2581 Secondary hyperparathyroidism of renal origin: Secondary | ICD-10-CM | POA: Diagnosis not present

## 2019-05-25 DIAGNOSIS — R519 Headache, unspecified: Secondary | ICD-10-CM | POA: Diagnosis not present

## 2019-05-25 DIAGNOSIS — Z992 Dependence on renal dialysis: Secondary | ICD-10-CM | POA: Diagnosis not present

## 2019-05-25 DIAGNOSIS — N186 End stage renal disease: Secondary | ICD-10-CM | POA: Diagnosis not present

## 2019-05-25 DIAGNOSIS — Z794 Long term (current) use of insulin: Secondary | ICD-10-CM | POA: Diagnosis not present

## 2019-05-26 DIAGNOSIS — N2581 Secondary hyperparathyroidism of renal origin: Secondary | ICD-10-CM | POA: Diagnosis not present

## 2019-05-26 DIAGNOSIS — Z992 Dependence on renal dialysis: Secondary | ICD-10-CM | POA: Diagnosis not present

## 2019-05-26 DIAGNOSIS — Z794 Long term (current) use of insulin: Secondary | ICD-10-CM | POA: Diagnosis not present

## 2019-05-26 DIAGNOSIS — D509 Iron deficiency anemia, unspecified: Secondary | ICD-10-CM | POA: Diagnosis not present

## 2019-05-26 DIAGNOSIS — N186 End stage renal disease: Secondary | ICD-10-CM | POA: Diagnosis not present

## 2019-05-26 DIAGNOSIS — R519 Headache, unspecified: Secondary | ICD-10-CM | POA: Diagnosis not present

## 2019-05-27 DIAGNOSIS — D509 Iron deficiency anemia, unspecified: Secondary | ICD-10-CM | POA: Diagnosis not present

## 2019-05-27 DIAGNOSIS — N186 End stage renal disease: Secondary | ICD-10-CM | POA: Diagnosis not present

## 2019-05-27 DIAGNOSIS — N2581 Secondary hyperparathyroidism of renal origin: Secondary | ICD-10-CM | POA: Diagnosis not present

## 2019-05-27 DIAGNOSIS — Z992 Dependence on renal dialysis: Secondary | ICD-10-CM | POA: Diagnosis not present

## 2019-05-27 DIAGNOSIS — R519 Headache, unspecified: Secondary | ICD-10-CM | POA: Diagnosis not present

## 2019-05-27 DIAGNOSIS — Z794 Long term (current) use of insulin: Secondary | ICD-10-CM | POA: Diagnosis not present

## 2019-05-30 DIAGNOSIS — Z794 Long term (current) use of insulin: Secondary | ICD-10-CM | POA: Diagnosis not present

## 2019-05-30 DIAGNOSIS — N186 End stage renal disease: Secondary | ICD-10-CM | POA: Diagnosis not present

## 2019-05-30 DIAGNOSIS — R519 Headache, unspecified: Secondary | ICD-10-CM | POA: Diagnosis not present

## 2019-05-30 DIAGNOSIS — Z992 Dependence on renal dialysis: Secondary | ICD-10-CM | POA: Diagnosis not present

## 2019-05-30 DIAGNOSIS — D509 Iron deficiency anemia, unspecified: Secondary | ICD-10-CM | POA: Diagnosis not present

## 2019-05-30 DIAGNOSIS — N2581 Secondary hyperparathyroidism of renal origin: Secondary | ICD-10-CM | POA: Diagnosis not present

## 2019-05-31 DIAGNOSIS — Z20828 Contact with and (suspected) exposure to other viral communicable diseases: Secondary | ICD-10-CM | POA: Diagnosis not present

## 2019-06-01 DIAGNOSIS — N186 End stage renal disease: Secondary | ICD-10-CM | POA: Diagnosis not present

## 2019-06-01 DIAGNOSIS — N2581 Secondary hyperparathyroidism of renal origin: Secondary | ICD-10-CM | POA: Diagnosis not present

## 2019-06-01 DIAGNOSIS — Z794 Long term (current) use of insulin: Secondary | ICD-10-CM | POA: Diagnosis not present

## 2019-06-01 DIAGNOSIS — Z992 Dependence on renal dialysis: Secondary | ICD-10-CM | POA: Diagnosis not present

## 2019-06-01 DIAGNOSIS — D509 Iron deficiency anemia, unspecified: Secondary | ICD-10-CM | POA: Diagnosis not present

## 2019-06-01 DIAGNOSIS — R519 Headache, unspecified: Secondary | ICD-10-CM | POA: Diagnosis not present

## 2019-06-02 DIAGNOSIS — R519 Headache, unspecified: Secondary | ICD-10-CM | POA: Diagnosis not present

## 2019-06-02 DIAGNOSIS — Z992 Dependence on renal dialysis: Secondary | ICD-10-CM | POA: Diagnosis not present

## 2019-06-02 DIAGNOSIS — N2581 Secondary hyperparathyroidism of renal origin: Secondary | ICD-10-CM | POA: Diagnosis not present

## 2019-06-02 DIAGNOSIS — N186 End stage renal disease: Secondary | ICD-10-CM | POA: Diagnosis not present

## 2019-06-02 DIAGNOSIS — Z794 Long term (current) use of insulin: Secondary | ICD-10-CM | POA: Diagnosis not present

## 2019-06-02 DIAGNOSIS — D509 Iron deficiency anemia, unspecified: Secondary | ICD-10-CM | POA: Diagnosis not present

## 2019-06-03 DIAGNOSIS — Z992 Dependence on renal dialysis: Secondary | ICD-10-CM | POA: Diagnosis not present

## 2019-06-03 DIAGNOSIS — N186 End stage renal disease: Secondary | ICD-10-CM | POA: Diagnosis not present

## 2019-06-03 DIAGNOSIS — D509 Iron deficiency anemia, unspecified: Secondary | ICD-10-CM | POA: Diagnosis not present

## 2019-06-03 DIAGNOSIS — N2581 Secondary hyperparathyroidism of renal origin: Secondary | ICD-10-CM | POA: Diagnosis not present

## 2019-06-03 DIAGNOSIS — Z794 Long term (current) use of insulin: Secondary | ICD-10-CM | POA: Diagnosis not present

## 2019-06-03 DIAGNOSIS — R519 Headache, unspecified: Secondary | ICD-10-CM | POA: Diagnosis not present

## 2019-06-06 DIAGNOSIS — Z794 Long term (current) use of insulin: Secondary | ICD-10-CM | POA: Diagnosis not present

## 2019-06-06 DIAGNOSIS — N2581 Secondary hyperparathyroidism of renal origin: Secondary | ICD-10-CM | POA: Diagnosis not present

## 2019-06-06 DIAGNOSIS — D509 Iron deficiency anemia, unspecified: Secondary | ICD-10-CM | POA: Diagnosis not present

## 2019-06-06 DIAGNOSIS — R519 Headache, unspecified: Secondary | ICD-10-CM | POA: Diagnosis not present

## 2019-06-06 DIAGNOSIS — N186 End stage renal disease: Secondary | ICD-10-CM | POA: Diagnosis not present

## 2019-06-06 DIAGNOSIS — Z992 Dependence on renal dialysis: Secondary | ICD-10-CM | POA: Diagnosis not present

## 2019-06-08 DIAGNOSIS — Z794 Long term (current) use of insulin: Secondary | ICD-10-CM | POA: Diagnosis not present

## 2019-06-08 DIAGNOSIS — N2581 Secondary hyperparathyroidism of renal origin: Secondary | ICD-10-CM | POA: Diagnosis not present

## 2019-06-08 DIAGNOSIS — Z992 Dependence on renal dialysis: Secondary | ICD-10-CM | POA: Diagnosis not present

## 2019-06-08 DIAGNOSIS — D509 Iron deficiency anemia, unspecified: Secondary | ICD-10-CM | POA: Diagnosis not present

## 2019-06-08 DIAGNOSIS — N186 End stage renal disease: Secondary | ICD-10-CM | POA: Diagnosis not present

## 2019-06-08 DIAGNOSIS — R519 Headache, unspecified: Secondary | ICD-10-CM | POA: Diagnosis not present

## 2019-06-09 DIAGNOSIS — R519 Headache, unspecified: Secondary | ICD-10-CM | POA: Diagnosis not present

## 2019-06-09 DIAGNOSIS — Z992 Dependence on renal dialysis: Secondary | ICD-10-CM | POA: Diagnosis not present

## 2019-06-09 DIAGNOSIS — D509 Iron deficiency anemia, unspecified: Secondary | ICD-10-CM | POA: Diagnosis not present

## 2019-06-09 DIAGNOSIS — Z794 Long term (current) use of insulin: Secondary | ICD-10-CM | POA: Diagnosis not present

## 2019-06-09 DIAGNOSIS — N2581 Secondary hyperparathyroidism of renal origin: Secondary | ICD-10-CM | POA: Diagnosis not present

## 2019-06-09 DIAGNOSIS — N186 End stage renal disease: Secondary | ICD-10-CM | POA: Diagnosis not present

## 2019-06-10 DIAGNOSIS — Z992 Dependence on renal dialysis: Secondary | ICD-10-CM | POA: Diagnosis not present

## 2019-06-10 DIAGNOSIS — N186 End stage renal disease: Secondary | ICD-10-CM | POA: Diagnosis not present

## 2019-06-10 DIAGNOSIS — D509 Iron deficiency anemia, unspecified: Secondary | ICD-10-CM | POA: Diagnosis not present

## 2019-06-10 DIAGNOSIS — Z794 Long term (current) use of insulin: Secondary | ICD-10-CM | POA: Diagnosis not present

## 2019-06-10 DIAGNOSIS — N2581 Secondary hyperparathyroidism of renal origin: Secondary | ICD-10-CM | POA: Diagnosis not present

## 2019-06-10 DIAGNOSIS — R519 Headache, unspecified: Secondary | ICD-10-CM | POA: Diagnosis not present

## 2019-06-13 ENCOUNTER — Other Ambulatory Visit: Payer: Self-pay

## 2019-06-13 ENCOUNTER — Ambulatory Visit (INDEPENDENT_AMBULATORY_CARE_PROVIDER_SITE_OTHER): Payer: Self-pay | Admitting: Physician Assistant

## 2019-06-13 VITALS — BP 140/71 | HR 68 | Temp 98.0°F | Resp 16 | Ht 66.0 in | Wt 165.0 lb

## 2019-06-13 DIAGNOSIS — N186 End stage renal disease: Secondary | ICD-10-CM

## 2019-06-13 DIAGNOSIS — Z794 Long term (current) use of insulin: Secondary | ICD-10-CM | POA: Diagnosis not present

## 2019-06-13 DIAGNOSIS — Z992 Dependence on renal dialysis: Secondary | ICD-10-CM

## 2019-06-13 DIAGNOSIS — N2581 Secondary hyperparathyroidism of renal origin: Secondary | ICD-10-CM | POA: Diagnosis not present

## 2019-06-13 DIAGNOSIS — D509 Iron deficiency anemia, unspecified: Secondary | ICD-10-CM | POA: Diagnosis not present

## 2019-06-13 DIAGNOSIS — R519 Headache, unspecified: Secondary | ICD-10-CM | POA: Diagnosis not present

## 2019-06-13 NOTE — Progress Notes (Signed)
  POST OPERATIVE OFFICE NOTE    CC:  F/u for surgery 05/18/2019  Procedure Performed: 1.  Left arm fistulogram 2.  Balloon angioplasty of left cephalic arch with 71mm balloon 3.  Revision of left arm av fistula with excision of aneurysm  HPI:  This is a 63 y.o. female who is s/p Left UE surgery follow up visit.  Patient denise pain, loss of sensation or motor.    She denise problems with HD since the surgery and does dialysis at home.    No Known Allergies  Current Outpatient Medications  Medication Sig Dispense Refill  . acetaminophen (TYLENOL) 500 MG tablet Take 500-1,000 mg by mouth every 8 (eight) hours as needed for mild pain or headache (depends on pain if takes 1-2 tablets).    Marland Kitchen amLODipine (NORVASC) 10 MG tablet Take 10 mg by mouth every evening.     Marland Kitchen amoxicillin (AMOXIL) 500 MG capsule Take 2,000 mg by mouth See admin instructions. Take 4 capsules (2000 mg) by mouth 1 hour prior to dental work    . B-D ULTRAFINE III SHORT PEN 31G X 8 MM MISC Use twice daily as directed  0  . calcitRIOL (ROCALTROL) 0.5 MCG capsule Take 1.5 mcg by mouth daily.     . cinacalcet (SENSIPAR) 30 MG tablet Take 30 mg by mouth daily.    Marland Kitchen ezetimibe-simvastatin (VYTORIN) 10-20 MG per tablet Take 1 tablet by mouth at bedtime.    . ferric citrate (AURYXIA) 1 GM 210 MG(Fe) tablet Take 210 mg by mouth 3 (three) times daily with meals.     Marland Kitchen HYDROcodone-acetaminophen (NORCO) 5-325 MG tablet Take 1 tablet by mouth every 6 (six) hours as needed for moderate pain. 20 tablet 0  . labetalol (NORMODYNE) 200 MG tablet Take 1 tablet (200 mg total) by mouth 2 (two) times daily. (Patient taking differently: Take 300 mg by mouth 2 (two) times daily. ) 60 tablet 0  . omeprazole (PRILOSEC) 40 MG capsule Take 40 mg by mouth daily.  0  . predniSONE (DELTASONE) 5 MG tablet Take 5 mg by mouth daily.    Nelva Nay MAX SOLOSTAR 300 UNIT/ML SOPN Inject 8 Units into the skin every evening.     No current facility-administered  medications for this visit.     ROS:  See HPI  Physical Exam:  Vitals:   06/13/19 1340  BP: 140/71  Pulse: 68  Resp: 16  Temp: 98 F (36.7 C)  SpO2: 96%     Incision:  Well healed UE proximal incision.  Palpable thrill in fistual throughout it's course. Extremities:  Distal half of the fistula is aneurysmal without ulcer and mobile skin covering the fistula.   Lungs non labored breathing GEN NAD  Assessment/Plan:  This is a 63 y.o. female who is s/p: left upper arm fistula on 08-11-2017 by Dr. Scot Dock. She is on home dialysis.  Recently s/p  Procedure Performed: 1.  Left arm fistulogram 2.  Balloon angioplasty of left cephalic arch with 54mm balloon 3.  Revision of left arm av fistula with excision of aneurysm By Dr. Chesley Noon is working fine without problems.  No ulcers or prolonged bleeding with distal portion of fistula.  She will continue home dialysis.  If she has problems or concerns she will call our office.  Otherwise she will f/u PRN.    Roxy Horseman PA-C [facility] [phone]  Clinic MD:  Trula Slade

## 2019-06-15 DIAGNOSIS — Z992 Dependence on renal dialysis: Secondary | ICD-10-CM | POA: Diagnosis not present

## 2019-06-15 DIAGNOSIS — N186 End stage renal disease: Secondary | ICD-10-CM | POA: Diagnosis not present

## 2019-06-15 DIAGNOSIS — Z794 Long term (current) use of insulin: Secondary | ICD-10-CM | POA: Diagnosis not present

## 2019-06-15 DIAGNOSIS — D509 Iron deficiency anemia, unspecified: Secondary | ICD-10-CM | POA: Diagnosis not present

## 2019-06-15 DIAGNOSIS — N2581 Secondary hyperparathyroidism of renal origin: Secondary | ICD-10-CM | POA: Diagnosis not present

## 2019-06-15 DIAGNOSIS — R519 Headache, unspecified: Secondary | ICD-10-CM | POA: Diagnosis not present

## 2019-06-16 DIAGNOSIS — Z794 Long term (current) use of insulin: Secondary | ICD-10-CM | POA: Diagnosis not present

## 2019-06-16 DIAGNOSIS — Z992 Dependence on renal dialysis: Secondary | ICD-10-CM | POA: Diagnosis not present

## 2019-06-16 DIAGNOSIS — N186 End stage renal disease: Secondary | ICD-10-CM | POA: Diagnosis not present

## 2019-06-16 DIAGNOSIS — N2581 Secondary hyperparathyroidism of renal origin: Secondary | ICD-10-CM | POA: Diagnosis not present

## 2019-06-16 DIAGNOSIS — R519 Headache, unspecified: Secondary | ICD-10-CM | POA: Diagnosis not present

## 2019-06-16 DIAGNOSIS — D509 Iron deficiency anemia, unspecified: Secondary | ICD-10-CM | POA: Diagnosis not present

## 2019-06-17 DIAGNOSIS — N2581 Secondary hyperparathyroidism of renal origin: Secondary | ICD-10-CM | POA: Diagnosis not present

## 2019-06-17 DIAGNOSIS — N186 End stage renal disease: Secondary | ICD-10-CM | POA: Diagnosis not present

## 2019-06-17 DIAGNOSIS — Z794 Long term (current) use of insulin: Secondary | ICD-10-CM | POA: Diagnosis not present

## 2019-06-17 DIAGNOSIS — Z992 Dependence on renal dialysis: Secondary | ICD-10-CM | POA: Diagnosis not present

## 2019-06-17 DIAGNOSIS — D509 Iron deficiency anemia, unspecified: Secondary | ICD-10-CM | POA: Diagnosis not present

## 2019-06-17 DIAGNOSIS — R519 Headache, unspecified: Secondary | ICD-10-CM | POA: Diagnosis not present

## 2019-06-20 DIAGNOSIS — N2581 Secondary hyperparathyroidism of renal origin: Secondary | ICD-10-CM | POA: Diagnosis not present

## 2019-06-20 DIAGNOSIS — Z794 Long term (current) use of insulin: Secondary | ICD-10-CM | POA: Diagnosis not present

## 2019-06-20 DIAGNOSIS — R519 Headache, unspecified: Secondary | ICD-10-CM | POA: Diagnosis not present

## 2019-06-20 DIAGNOSIS — N186 End stage renal disease: Secondary | ICD-10-CM | POA: Diagnosis not present

## 2019-06-20 DIAGNOSIS — Z992 Dependence on renal dialysis: Secondary | ICD-10-CM | POA: Diagnosis not present

## 2019-06-20 DIAGNOSIS — D509 Iron deficiency anemia, unspecified: Secondary | ICD-10-CM | POA: Diagnosis not present

## 2019-06-22 DIAGNOSIS — Z794 Long term (current) use of insulin: Secondary | ICD-10-CM | POA: Diagnosis not present

## 2019-06-22 DIAGNOSIS — D509 Iron deficiency anemia, unspecified: Secondary | ICD-10-CM | POA: Diagnosis not present

## 2019-06-22 DIAGNOSIS — Z992 Dependence on renal dialysis: Secondary | ICD-10-CM | POA: Diagnosis not present

## 2019-06-22 DIAGNOSIS — R519 Headache, unspecified: Secondary | ICD-10-CM | POA: Diagnosis not present

## 2019-06-22 DIAGNOSIS — N2581 Secondary hyperparathyroidism of renal origin: Secondary | ICD-10-CM | POA: Diagnosis not present

## 2019-06-22 DIAGNOSIS — N186 End stage renal disease: Secondary | ICD-10-CM | POA: Diagnosis not present

## 2019-06-23 DIAGNOSIS — N186 End stage renal disease: Secondary | ICD-10-CM | POA: Diagnosis not present

## 2019-06-23 DIAGNOSIS — Z794 Long term (current) use of insulin: Secondary | ICD-10-CM | POA: Diagnosis not present

## 2019-06-23 DIAGNOSIS — R519 Headache, unspecified: Secondary | ICD-10-CM | POA: Diagnosis not present

## 2019-06-23 DIAGNOSIS — N2581 Secondary hyperparathyroidism of renal origin: Secondary | ICD-10-CM | POA: Diagnosis not present

## 2019-06-23 DIAGNOSIS — Z992 Dependence on renal dialysis: Secondary | ICD-10-CM | POA: Diagnosis not present

## 2019-06-23 DIAGNOSIS — D509 Iron deficiency anemia, unspecified: Secondary | ICD-10-CM | POA: Diagnosis not present

## 2019-06-24 DIAGNOSIS — Z794 Long term (current) use of insulin: Secondary | ICD-10-CM | POA: Diagnosis not present

## 2019-06-24 DIAGNOSIS — N186 End stage renal disease: Secondary | ICD-10-CM | POA: Diagnosis not present

## 2019-06-24 DIAGNOSIS — R252 Cramp and spasm: Secondary | ICD-10-CM | POA: Diagnosis not present

## 2019-06-24 DIAGNOSIS — Z992 Dependence on renal dialysis: Secondary | ICD-10-CM | POA: Diagnosis not present

## 2019-06-24 DIAGNOSIS — Z4931 Encounter for adequacy testing for hemodialysis: Secondary | ICD-10-CM | POA: Diagnosis not present

## 2019-06-24 DIAGNOSIS — T8610 Unspecified complication of kidney transplant: Secondary | ICD-10-CM | POA: Diagnosis not present

## 2019-06-24 DIAGNOSIS — E1122 Type 2 diabetes mellitus with diabetic chronic kidney disease: Secondary | ICD-10-CM | POA: Diagnosis not present

## 2019-06-24 DIAGNOSIS — I953 Hypotension of hemodialysis: Secondary | ICD-10-CM | POA: Diagnosis not present

## 2019-06-24 DIAGNOSIS — D509 Iron deficiency anemia, unspecified: Secondary | ICD-10-CM | POA: Diagnosis not present

## 2019-06-24 DIAGNOSIS — I120 Hypertensive chronic kidney disease with stage 5 chronic kidney disease or end stage renal disease: Secondary | ICD-10-CM | POA: Diagnosis not present

## 2019-06-24 DIAGNOSIS — N2581 Secondary hyperparathyroidism of renal origin: Secondary | ICD-10-CM | POA: Diagnosis not present

## 2019-06-24 DIAGNOSIS — R519 Headache, unspecified: Secondary | ICD-10-CM | POA: Diagnosis not present

## 2019-06-27 DIAGNOSIS — N2581 Secondary hyperparathyroidism of renal origin: Secondary | ICD-10-CM | POA: Diagnosis not present

## 2019-06-27 DIAGNOSIS — Z992 Dependence on renal dialysis: Secondary | ICD-10-CM | POA: Diagnosis not present

## 2019-06-27 DIAGNOSIS — N186 End stage renal disease: Secondary | ICD-10-CM | POA: Diagnosis not present

## 2019-06-27 DIAGNOSIS — Z4931 Encounter for adequacy testing for hemodialysis: Secondary | ICD-10-CM | POA: Diagnosis not present

## 2019-06-27 DIAGNOSIS — Z794 Long term (current) use of insulin: Secondary | ICD-10-CM | POA: Diagnosis not present

## 2019-06-27 DIAGNOSIS — D509 Iron deficiency anemia, unspecified: Secondary | ICD-10-CM | POA: Diagnosis not present

## 2019-06-29 DIAGNOSIS — N2581 Secondary hyperparathyroidism of renal origin: Secondary | ICD-10-CM | POA: Diagnosis not present

## 2019-06-29 DIAGNOSIS — M109 Gout, unspecified: Secondary | ICD-10-CM | POA: Diagnosis not present

## 2019-06-29 DIAGNOSIS — Z4931 Encounter for adequacy testing for hemodialysis: Secondary | ICD-10-CM | POA: Diagnosis not present

## 2019-06-29 DIAGNOSIS — Z992 Dependence on renal dialysis: Secondary | ICD-10-CM | POA: Diagnosis not present

## 2019-06-29 DIAGNOSIS — Z794 Long term (current) use of insulin: Secondary | ICD-10-CM | POA: Diagnosis not present

## 2019-06-29 DIAGNOSIS — E1129 Type 2 diabetes mellitus with other diabetic kidney complication: Secondary | ICD-10-CM | POA: Diagnosis not present

## 2019-06-29 DIAGNOSIS — D509 Iron deficiency anemia, unspecified: Secondary | ICD-10-CM | POA: Diagnosis not present

## 2019-06-29 DIAGNOSIS — N186 End stage renal disease: Secondary | ICD-10-CM | POA: Diagnosis not present

## 2019-06-30 DIAGNOSIS — Z4931 Encounter for adequacy testing for hemodialysis: Secondary | ICD-10-CM | POA: Diagnosis not present

## 2019-06-30 DIAGNOSIS — N2581 Secondary hyperparathyroidism of renal origin: Secondary | ICD-10-CM | POA: Diagnosis not present

## 2019-06-30 DIAGNOSIS — D509 Iron deficiency anemia, unspecified: Secondary | ICD-10-CM | POA: Diagnosis not present

## 2019-06-30 DIAGNOSIS — N186 End stage renal disease: Secondary | ICD-10-CM | POA: Diagnosis not present

## 2019-06-30 DIAGNOSIS — Z794 Long term (current) use of insulin: Secondary | ICD-10-CM | POA: Diagnosis not present

## 2019-06-30 DIAGNOSIS — Z992 Dependence on renal dialysis: Secondary | ICD-10-CM | POA: Diagnosis not present

## 2019-07-01 DIAGNOSIS — N2581 Secondary hyperparathyroidism of renal origin: Secondary | ICD-10-CM | POA: Diagnosis not present

## 2019-07-01 DIAGNOSIS — D509 Iron deficiency anemia, unspecified: Secondary | ICD-10-CM | POA: Diagnosis not present

## 2019-07-01 DIAGNOSIS — Z794 Long term (current) use of insulin: Secondary | ICD-10-CM | POA: Diagnosis not present

## 2019-07-01 DIAGNOSIS — Z4931 Encounter for adequacy testing for hemodialysis: Secondary | ICD-10-CM | POA: Diagnosis not present

## 2019-07-01 DIAGNOSIS — N186 End stage renal disease: Secondary | ICD-10-CM | POA: Diagnosis not present

## 2019-07-01 DIAGNOSIS — Z992 Dependence on renal dialysis: Secondary | ICD-10-CM | POA: Diagnosis not present

## 2019-07-04 DIAGNOSIS — N186 End stage renal disease: Secondary | ICD-10-CM | POA: Diagnosis not present

## 2019-07-04 DIAGNOSIS — Z794 Long term (current) use of insulin: Secondary | ICD-10-CM | POA: Diagnosis not present

## 2019-07-04 DIAGNOSIS — N2581 Secondary hyperparathyroidism of renal origin: Secondary | ICD-10-CM | POA: Diagnosis not present

## 2019-07-04 DIAGNOSIS — Z4931 Encounter for adequacy testing for hemodialysis: Secondary | ICD-10-CM | POA: Diagnosis not present

## 2019-07-04 DIAGNOSIS — Z992 Dependence on renal dialysis: Secondary | ICD-10-CM | POA: Diagnosis not present

## 2019-07-04 DIAGNOSIS — D509 Iron deficiency anemia, unspecified: Secondary | ICD-10-CM | POA: Diagnosis not present

## 2019-07-06 DIAGNOSIS — N186 End stage renal disease: Secondary | ICD-10-CM | POA: Diagnosis not present

## 2019-07-06 DIAGNOSIS — Z794 Long term (current) use of insulin: Secondary | ICD-10-CM | POA: Diagnosis not present

## 2019-07-06 DIAGNOSIS — Z992 Dependence on renal dialysis: Secondary | ICD-10-CM | POA: Diagnosis not present

## 2019-07-06 DIAGNOSIS — D509 Iron deficiency anemia, unspecified: Secondary | ICD-10-CM | POA: Diagnosis not present

## 2019-07-06 DIAGNOSIS — Z4931 Encounter for adequacy testing for hemodialysis: Secondary | ICD-10-CM | POA: Diagnosis not present

## 2019-07-06 DIAGNOSIS — N2581 Secondary hyperparathyroidism of renal origin: Secondary | ICD-10-CM | POA: Diagnosis not present

## 2019-07-07 DIAGNOSIS — Z794 Long term (current) use of insulin: Secondary | ICD-10-CM | POA: Diagnosis not present

## 2019-07-07 DIAGNOSIS — Z992 Dependence on renal dialysis: Secondary | ICD-10-CM | POA: Diagnosis not present

## 2019-07-07 DIAGNOSIS — Z4931 Encounter for adequacy testing for hemodialysis: Secondary | ICD-10-CM | POA: Diagnosis not present

## 2019-07-07 DIAGNOSIS — N2581 Secondary hyperparathyroidism of renal origin: Secondary | ICD-10-CM | POA: Diagnosis not present

## 2019-07-07 DIAGNOSIS — D509 Iron deficiency anemia, unspecified: Secondary | ICD-10-CM | POA: Diagnosis not present

## 2019-07-07 DIAGNOSIS — N186 End stage renal disease: Secondary | ICD-10-CM | POA: Diagnosis not present

## 2019-07-08 DIAGNOSIS — M859 Disorder of bone density and structure, unspecified: Secondary | ICD-10-CM | POA: Diagnosis not present

## 2019-07-08 DIAGNOSIS — E042 Nontoxic multinodular goiter: Secondary | ICD-10-CM | POA: Diagnosis not present

## 2019-07-08 DIAGNOSIS — N186 End stage renal disease: Secondary | ICD-10-CM | POA: Diagnosis not present

## 2019-07-08 DIAGNOSIS — N2581 Secondary hyperparathyroidism of renal origin: Secondary | ICD-10-CM | POA: Diagnosis not present

## 2019-07-08 DIAGNOSIS — Z4931 Encounter for adequacy testing for hemodialysis: Secondary | ICD-10-CM | POA: Diagnosis not present

## 2019-07-08 DIAGNOSIS — D509 Iron deficiency anemia, unspecified: Secondary | ICD-10-CM | POA: Diagnosis not present

## 2019-07-08 DIAGNOSIS — Z794 Long term (current) use of insulin: Secondary | ICD-10-CM | POA: Diagnosis not present

## 2019-07-08 DIAGNOSIS — E7849 Other hyperlipidemia: Secondary | ICD-10-CM | POA: Diagnosis not present

## 2019-07-08 DIAGNOSIS — E1129 Type 2 diabetes mellitus with other diabetic kidney complication: Secondary | ICD-10-CM | POA: Diagnosis not present

## 2019-07-08 DIAGNOSIS — E0590 Thyrotoxicosis, unspecified without thyrotoxic crisis or storm: Secondary | ICD-10-CM | POA: Diagnosis not present

## 2019-07-08 DIAGNOSIS — Z992 Dependence on renal dialysis: Secondary | ICD-10-CM | POA: Diagnosis not present

## 2019-07-11 DIAGNOSIS — Z992 Dependence on renal dialysis: Secondary | ICD-10-CM | POA: Diagnosis not present

## 2019-07-11 DIAGNOSIS — D509 Iron deficiency anemia, unspecified: Secondary | ICD-10-CM | POA: Diagnosis not present

## 2019-07-11 DIAGNOSIS — Z4931 Encounter for adequacy testing for hemodialysis: Secondary | ICD-10-CM | POA: Diagnosis not present

## 2019-07-11 DIAGNOSIS — N186 End stage renal disease: Secondary | ICD-10-CM | POA: Diagnosis not present

## 2019-07-11 DIAGNOSIS — N2581 Secondary hyperparathyroidism of renal origin: Secondary | ICD-10-CM | POA: Diagnosis not present

## 2019-07-11 DIAGNOSIS — Z794 Long term (current) use of insulin: Secondary | ICD-10-CM | POA: Diagnosis not present

## 2019-07-13 DIAGNOSIS — N186 End stage renal disease: Secondary | ICD-10-CM | POA: Diagnosis not present

## 2019-07-13 DIAGNOSIS — Z992 Dependence on renal dialysis: Secondary | ICD-10-CM | POA: Diagnosis not present

## 2019-07-13 DIAGNOSIS — D509 Iron deficiency anemia, unspecified: Secondary | ICD-10-CM | POA: Diagnosis not present

## 2019-07-13 DIAGNOSIS — N2581 Secondary hyperparathyroidism of renal origin: Secondary | ICD-10-CM | POA: Diagnosis not present

## 2019-07-13 DIAGNOSIS — Z794 Long term (current) use of insulin: Secondary | ICD-10-CM | POA: Diagnosis not present

## 2019-07-13 DIAGNOSIS — Z4931 Encounter for adequacy testing for hemodialysis: Secondary | ICD-10-CM | POA: Diagnosis not present

## 2019-07-14 DIAGNOSIS — Z4931 Encounter for adequacy testing for hemodialysis: Secondary | ICD-10-CM | POA: Diagnosis not present

## 2019-07-14 DIAGNOSIS — N186 End stage renal disease: Secondary | ICD-10-CM | POA: Diagnosis not present

## 2019-07-14 DIAGNOSIS — Z992 Dependence on renal dialysis: Secondary | ICD-10-CM | POA: Diagnosis not present

## 2019-07-14 DIAGNOSIS — Z794 Long term (current) use of insulin: Secondary | ICD-10-CM | POA: Diagnosis not present

## 2019-07-14 DIAGNOSIS — N2581 Secondary hyperparathyroidism of renal origin: Secondary | ICD-10-CM | POA: Diagnosis not present

## 2019-07-14 DIAGNOSIS — D509 Iron deficiency anemia, unspecified: Secondary | ICD-10-CM | POA: Diagnosis not present

## 2019-07-15 DIAGNOSIS — D638 Anemia in other chronic diseases classified elsewhere: Secondary | ICD-10-CM | POA: Diagnosis not present

## 2019-07-15 DIAGNOSIS — Z940 Kidney transplant status: Secondary | ICD-10-CM | POA: Diagnosis not present

## 2019-07-15 DIAGNOSIS — Z1331 Encounter for screening for depression: Secondary | ICD-10-CM | POA: Diagnosis not present

## 2019-07-15 DIAGNOSIS — E785 Hyperlipidemia, unspecified: Secondary | ICD-10-CM | POA: Diagnosis not present

## 2019-07-15 DIAGNOSIS — M109 Gout, unspecified: Secondary | ICD-10-CM | POA: Diagnosis not present

## 2019-07-15 DIAGNOSIS — E1129 Type 2 diabetes mellitus with other diabetic kidney complication: Secondary | ICD-10-CM | POA: Diagnosis not present

## 2019-07-15 DIAGNOSIS — Z0000 Encounter for general adult medical examination without abnormal findings: Secondary | ICD-10-CM | POA: Diagnosis not present

## 2019-07-15 DIAGNOSIS — N186 End stage renal disease: Secondary | ICD-10-CM | POA: Diagnosis not present

## 2019-07-15 DIAGNOSIS — D849 Immunodeficiency, unspecified: Secondary | ICD-10-CM | POA: Diagnosis not present

## 2019-07-15 DIAGNOSIS — M8580 Other specified disorders of bone density and structure, unspecified site: Secondary | ICD-10-CM | POA: Diagnosis not present

## 2019-07-15 DIAGNOSIS — D509 Iron deficiency anemia, unspecified: Secondary | ICD-10-CM | POA: Diagnosis not present

## 2019-07-15 DIAGNOSIS — I10 Essential (primary) hypertension: Secondary | ICD-10-CM | POA: Diagnosis not present

## 2019-07-15 DIAGNOSIS — N2581 Secondary hyperparathyroidism of renal origin: Secondary | ICD-10-CM | POA: Diagnosis not present

## 2019-07-15 DIAGNOSIS — Z992 Dependence on renal dialysis: Secondary | ICD-10-CM | POA: Diagnosis not present

## 2019-07-15 DIAGNOSIS — Z1339 Encounter for screening examination for other mental health and behavioral disorders: Secondary | ICD-10-CM | POA: Diagnosis not present

## 2019-07-15 DIAGNOSIS — Z794 Long term (current) use of insulin: Secondary | ICD-10-CM | POA: Diagnosis not present

## 2019-07-15 DIAGNOSIS — Z4931 Encounter for adequacy testing for hemodialysis: Secondary | ICD-10-CM | POA: Diagnosis not present

## 2019-07-15 DIAGNOSIS — E042 Nontoxic multinodular goiter: Secondary | ICD-10-CM | POA: Diagnosis not present

## 2019-07-18 DIAGNOSIS — Z794 Long term (current) use of insulin: Secondary | ICD-10-CM | POA: Diagnosis not present

## 2019-07-18 DIAGNOSIS — Z4931 Encounter for adequacy testing for hemodialysis: Secondary | ICD-10-CM | POA: Diagnosis not present

## 2019-07-18 DIAGNOSIS — D509 Iron deficiency anemia, unspecified: Secondary | ICD-10-CM | POA: Diagnosis not present

## 2019-07-18 DIAGNOSIS — Z992 Dependence on renal dialysis: Secondary | ICD-10-CM | POA: Diagnosis not present

## 2019-07-18 DIAGNOSIS — N186 End stage renal disease: Secondary | ICD-10-CM | POA: Diagnosis not present

## 2019-07-18 DIAGNOSIS — N2581 Secondary hyperparathyroidism of renal origin: Secondary | ICD-10-CM | POA: Diagnosis not present

## 2019-07-20 DIAGNOSIS — N2581 Secondary hyperparathyroidism of renal origin: Secondary | ICD-10-CM | POA: Diagnosis not present

## 2019-07-20 DIAGNOSIS — N186 End stage renal disease: Secondary | ICD-10-CM | POA: Diagnosis not present

## 2019-07-20 DIAGNOSIS — Z794 Long term (current) use of insulin: Secondary | ICD-10-CM | POA: Diagnosis not present

## 2019-07-20 DIAGNOSIS — D509 Iron deficiency anemia, unspecified: Secondary | ICD-10-CM | POA: Diagnosis not present

## 2019-07-20 DIAGNOSIS — Z4931 Encounter for adequacy testing for hemodialysis: Secondary | ICD-10-CM | POA: Diagnosis not present

## 2019-07-20 DIAGNOSIS — Z992 Dependence on renal dialysis: Secondary | ICD-10-CM | POA: Diagnosis not present

## 2019-07-21 DIAGNOSIS — D509 Iron deficiency anemia, unspecified: Secondary | ICD-10-CM | POA: Diagnosis not present

## 2019-07-21 DIAGNOSIS — N186 End stage renal disease: Secondary | ICD-10-CM | POA: Diagnosis not present

## 2019-07-21 DIAGNOSIS — Z794 Long term (current) use of insulin: Secondary | ICD-10-CM | POA: Diagnosis not present

## 2019-07-21 DIAGNOSIS — Z4931 Encounter for adequacy testing for hemodialysis: Secondary | ICD-10-CM | POA: Diagnosis not present

## 2019-07-21 DIAGNOSIS — Z992 Dependence on renal dialysis: Secondary | ICD-10-CM | POA: Diagnosis not present

## 2019-07-21 DIAGNOSIS — N2581 Secondary hyperparathyroidism of renal origin: Secondary | ICD-10-CM | POA: Diagnosis not present

## 2019-07-22 DIAGNOSIS — Z794 Long term (current) use of insulin: Secondary | ICD-10-CM | POA: Diagnosis not present

## 2019-07-22 DIAGNOSIS — N2581 Secondary hyperparathyroidism of renal origin: Secondary | ICD-10-CM | POA: Diagnosis not present

## 2019-07-22 DIAGNOSIS — Z4931 Encounter for adequacy testing for hemodialysis: Secondary | ICD-10-CM | POA: Diagnosis not present

## 2019-07-22 DIAGNOSIS — N186 End stage renal disease: Secondary | ICD-10-CM | POA: Diagnosis not present

## 2019-07-22 DIAGNOSIS — D509 Iron deficiency anemia, unspecified: Secondary | ICD-10-CM | POA: Diagnosis not present

## 2019-07-22 DIAGNOSIS — Z992 Dependence on renal dialysis: Secondary | ICD-10-CM | POA: Diagnosis not present

## 2019-07-25 DIAGNOSIS — Z794 Long term (current) use of insulin: Secondary | ICD-10-CM | POA: Diagnosis not present

## 2019-07-25 DIAGNOSIS — I120 Hypertensive chronic kidney disease with stage 5 chronic kidney disease or end stage renal disease: Secondary | ICD-10-CM | POA: Diagnosis not present

## 2019-07-25 DIAGNOSIS — Z992 Dependence on renal dialysis: Secondary | ICD-10-CM | POA: Diagnosis not present

## 2019-07-25 DIAGNOSIS — T8610 Unspecified complication of kidney transplant: Secondary | ICD-10-CM | POA: Diagnosis not present

## 2019-07-25 DIAGNOSIS — I953 Hypotension of hemodialysis: Secondary | ICD-10-CM | POA: Diagnosis not present

## 2019-07-25 DIAGNOSIS — N186 End stage renal disease: Secondary | ICD-10-CM | POA: Diagnosis not present

## 2019-07-25 DIAGNOSIS — E1122 Type 2 diabetes mellitus with diabetic chronic kidney disease: Secondary | ICD-10-CM | POA: Diagnosis not present

## 2019-07-25 DIAGNOSIS — R252 Cramp and spasm: Secondary | ICD-10-CM | POA: Diagnosis not present

## 2019-07-25 DIAGNOSIS — D509 Iron deficiency anemia, unspecified: Secondary | ICD-10-CM | POA: Diagnosis not present

## 2019-07-25 DIAGNOSIS — R519 Headache, unspecified: Secondary | ICD-10-CM | POA: Diagnosis not present

## 2019-07-25 DIAGNOSIS — N2581 Secondary hyperparathyroidism of renal origin: Secondary | ICD-10-CM | POA: Diagnosis not present

## 2019-07-27 DIAGNOSIS — Z794 Long term (current) use of insulin: Secondary | ICD-10-CM | POA: Diagnosis not present

## 2019-07-27 DIAGNOSIS — Z992 Dependence on renal dialysis: Secondary | ICD-10-CM | POA: Diagnosis not present

## 2019-07-27 DIAGNOSIS — D509 Iron deficiency anemia, unspecified: Secondary | ICD-10-CM | POA: Diagnosis not present

## 2019-07-27 DIAGNOSIS — N186 End stage renal disease: Secondary | ICD-10-CM | POA: Diagnosis not present

## 2019-07-27 DIAGNOSIS — R519 Headache, unspecified: Secondary | ICD-10-CM | POA: Diagnosis not present

## 2019-07-27 DIAGNOSIS — N2581 Secondary hyperparathyroidism of renal origin: Secondary | ICD-10-CM | POA: Diagnosis not present

## 2019-07-28 DIAGNOSIS — N2581 Secondary hyperparathyroidism of renal origin: Secondary | ICD-10-CM | POA: Diagnosis not present

## 2019-07-28 DIAGNOSIS — M109 Gout, unspecified: Secondary | ICD-10-CM | POA: Diagnosis not present

## 2019-07-28 DIAGNOSIS — D509 Iron deficiency anemia, unspecified: Secondary | ICD-10-CM | POA: Diagnosis not present

## 2019-07-28 DIAGNOSIS — R519 Headache, unspecified: Secondary | ICD-10-CM | POA: Diagnosis not present

## 2019-07-28 DIAGNOSIS — N186 End stage renal disease: Secondary | ICD-10-CM | POA: Diagnosis not present

## 2019-07-28 DIAGNOSIS — Z992 Dependence on renal dialysis: Secondary | ICD-10-CM | POA: Diagnosis not present

## 2019-07-28 DIAGNOSIS — Z794 Long term (current) use of insulin: Secondary | ICD-10-CM | POA: Diagnosis not present

## 2019-07-31 DIAGNOSIS — N186 End stage renal disease: Secondary | ICD-10-CM | POA: Diagnosis not present

## 2019-07-31 DIAGNOSIS — Z992 Dependence on renal dialysis: Secondary | ICD-10-CM | POA: Diagnosis not present

## 2019-07-31 DIAGNOSIS — Z794 Long term (current) use of insulin: Secondary | ICD-10-CM | POA: Diagnosis not present

## 2019-07-31 DIAGNOSIS — D509 Iron deficiency anemia, unspecified: Secondary | ICD-10-CM | POA: Diagnosis not present

## 2019-07-31 DIAGNOSIS — N2581 Secondary hyperparathyroidism of renal origin: Secondary | ICD-10-CM | POA: Diagnosis not present

## 2019-07-31 DIAGNOSIS — R519 Headache, unspecified: Secondary | ICD-10-CM | POA: Diagnosis not present

## 2019-08-01 DIAGNOSIS — D509 Iron deficiency anemia, unspecified: Secondary | ICD-10-CM | POA: Diagnosis not present

## 2019-08-01 DIAGNOSIS — N186 End stage renal disease: Secondary | ICD-10-CM | POA: Diagnosis not present

## 2019-08-01 DIAGNOSIS — N2581 Secondary hyperparathyroidism of renal origin: Secondary | ICD-10-CM | POA: Diagnosis not present

## 2019-08-01 DIAGNOSIS — Z992 Dependence on renal dialysis: Secondary | ICD-10-CM | POA: Diagnosis not present

## 2019-08-01 DIAGNOSIS — Z794 Long term (current) use of insulin: Secondary | ICD-10-CM | POA: Diagnosis not present

## 2019-08-01 DIAGNOSIS — R519 Headache, unspecified: Secondary | ICD-10-CM | POA: Diagnosis not present

## 2019-08-03 DIAGNOSIS — Z794 Long term (current) use of insulin: Secondary | ICD-10-CM | POA: Diagnosis not present

## 2019-08-03 DIAGNOSIS — Z992 Dependence on renal dialysis: Secondary | ICD-10-CM | POA: Diagnosis not present

## 2019-08-03 DIAGNOSIS — N2581 Secondary hyperparathyroidism of renal origin: Secondary | ICD-10-CM | POA: Diagnosis not present

## 2019-08-03 DIAGNOSIS — D509 Iron deficiency anemia, unspecified: Secondary | ICD-10-CM | POA: Diagnosis not present

## 2019-08-03 DIAGNOSIS — N186 End stage renal disease: Secondary | ICD-10-CM | POA: Diagnosis not present

## 2019-08-03 DIAGNOSIS — R519 Headache, unspecified: Secondary | ICD-10-CM | POA: Diagnosis not present

## 2019-08-04 DIAGNOSIS — D509 Iron deficiency anemia, unspecified: Secondary | ICD-10-CM | POA: Diagnosis not present

## 2019-08-04 DIAGNOSIS — N186 End stage renal disease: Secondary | ICD-10-CM | POA: Diagnosis not present

## 2019-08-04 DIAGNOSIS — N2581 Secondary hyperparathyroidism of renal origin: Secondary | ICD-10-CM | POA: Diagnosis not present

## 2019-08-04 DIAGNOSIS — R519 Headache, unspecified: Secondary | ICD-10-CM | POA: Diagnosis not present

## 2019-08-04 DIAGNOSIS — Z794 Long term (current) use of insulin: Secondary | ICD-10-CM | POA: Diagnosis not present

## 2019-08-04 DIAGNOSIS — Z992 Dependence on renal dialysis: Secondary | ICD-10-CM | POA: Diagnosis not present

## 2019-08-07 DIAGNOSIS — R519 Headache, unspecified: Secondary | ICD-10-CM | POA: Diagnosis not present

## 2019-08-07 DIAGNOSIS — N2581 Secondary hyperparathyroidism of renal origin: Secondary | ICD-10-CM | POA: Diagnosis not present

## 2019-08-07 DIAGNOSIS — Z794 Long term (current) use of insulin: Secondary | ICD-10-CM | POA: Diagnosis not present

## 2019-08-07 DIAGNOSIS — N186 End stage renal disease: Secondary | ICD-10-CM | POA: Diagnosis not present

## 2019-08-07 DIAGNOSIS — D509 Iron deficiency anemia, unspecified: Secondary | ICD-10-CM | POA: Diagnosis not present

## 2019-08-07 DIAGNOSIS — Z992 Dependence on renal dialysis: Secondary | ICD-10-CM | POA: Diagnosis not present

## 2019-08-08 DIAGNOSIS — R519 Headache, unspecified: Secondary | ICD-10-CM | POA: Diagnosis not present

## 2019-08-08 DIAGNOSIS — N2581 Secondary hyperparathyroidism of renal origin: Secondary | ICD-10-CM | POA: Diagnosis not present

## 2019-08-08 DIAGNOSIS — Z794 Long term (current) use of insulin: Secondary | ICD-10-CM | POA: Diagnosis not present

## 2019-08-08 DIAGNOSIS — D509 Iron deficiency anemia, unspecified: Secondary | ICD-10-CM | POA: Diagnosis not present

## 2019-08-08 DIAGNOSIS — N186 End stage renal disease: Secondary | ICD-10-CM | POA: Diagnosis not present

## 2019-08-08 DIAGNOSIS — Z992 Dependence on renal dialysis: Secondary | ICD-10-CM | POA: Diagnosis not present

## 2019-08-10 DIAGNOSIS — D509 Iron deficiency anemia, unspecified: Secondary | ICD-10-CM | POA: Diagnosis not present

## 2019-08-10 DIAGNOSIS — N186 End stage renal disease: Secondary | ICD-10-CM | POA: Diagnosis not present

## 2019-08-10 DIAGNOSIS — Z794 Long term (current) use of insulin: Secondary | ICD-10-CM | POA: Diagnosis not present

## 2019-08-10 DIAGNOSIS — N2581 Secondary hyperparathyroidism of renal origin: Secondary | ICD-10-CM | POA: Diagnosis not present

## 2019-08-10 DIAGNOSIS — R519 Headache, unspecified: Secondary | ICD-10-CM | POA: Diagnosis not present

## 2019-08-10 DIAGNOSIS — Z992 Dependence on renal dialysis: Secondary | ICD-10-CM | POA: Diagnosis not present

## 2019-08-11 DIAGNOSIS — Z992 Dependence on renal dialysis: Secondary | ICD-10-CM | POA: Diagnosis not present

## 2019-08-11 DIAGNOSIS — D509 Iron deficiency anemia, unspecified: Secondary | ICD-10-CM | POA: Diagnosis not present

## 2019-08-11 DIAGNOSIS — Z794 Long term (current) use of insulin: Secondary | ICD-10-CM | POA: Diagnosis not present

## 2019-08-11 DIAGNOSIS — N186 End stage renal disease: Secondary | ICD-10-CM | POA: Diagnosis not present

## 2019-08-11 DIAGNOSIS — R519 Headache, unspecified: Secondary | ICD-10-CM | POA: Diagnosis not present

## 2019-08-11 DIAGNOSIS — N2581 Secondary hyperparathyroidism of renal origin: Secondary | ICD-10-CM | POA: Diagnosis not present

## 2019-08-12 DIAGNOSIS — R519 Headache, unspecified: Secondary | ICD-10-CM | POA: Diagnosis not present

## 2019-08-12 DIAGNOSIS — Z992 Dependence on renal dialysis: Secondary | ICD-10-CM | POA: Diagnosis not present

## 2019-08-12 DIAGNOSIS — N186 End stage renal disease: Secondary | ICD-10-CM | POA: Diagnosis not present

## 2019-08-12 DIAGNOSIS — Z794 Long term (current) use of insulin: Secondary | ICD-10-CM | POA: Diagnosis not present

## 2019-08-12 DIAGNOSIS — N2581 Secondary hyperparathyroidism of renal origin: Secondary | ICD-10-CM | POA: Diagnosis not present

## 2019-08-12 DIAGNOSIS — D509 Iron deficiency anemia, unspecified: Secondary | ICD-10-CM | POA: Diagnosis not present

## 2019-08-15 DIAGNOSIS — D509 Iron deficiency anemia, unspecified: Secondary | ICD-10-CM | POA: Diagnosis not present

## 2019-08-15 DIAGNOSIS — N2581 Secondary hyperparathyroidism of renal origin: Secondary | ICD-10-CM | POA: Diagnosis not present

## 2019-08-15 DIAGNOSIS — Z794 Long term (current) use of insulin: Secondary | ICD-10-CM | POA: Diagnosis not present

## 2019-08-15 DIAGNOSIS — N186 End stage renal disease: Secondary | ICD-10-CM | POA: Diagnosis not present

## 2019-08-15 DIAGNOSIS — R519 Headache, unspecified: Secondary | ICD-10-CM | POA: Diagnosis not present

## 2019-08-15 DIAGNOSIS — Z992 Dependence on renal dialysis: Secondary | ICD-10-CM | POA: Diagnosis not present

## 2019-08-17 DIAGNOSIS — N186 End stage renal disease: Secondary | ICD-10-CM | POA: Diagnosis not present

## 2019-08-17 DIAGNOSIS — N2581 Secondary hyperparathyroidism of renal origin: Secondary | ICD-10-CM | POA: Diagnosis not present

## 2019-08-17 DIAGNOSIS — R519 Headache, unspecified: Secondary | ICD-10-CM | POA: Diagnosis not present

## 2019-08-17 DIAGNOSIS — Z794 Long term (current) use of insulin: Secondary | ICD-10-CM | POA: Diagnosis not present

## 2019-08-17 DIAGNOSIS — D509 Iron deficiency anemia, unspecified: Secondary | ICD-10-CM | POA: Diagnosis not present

## 2019-08-17 DIAGNOSIS — Z992 Dependence on renal dialysis: Secondary | ICD-10-CM | POA: Diagnosis not present

## 2019-08-18 DIAGNOSIS — N186 End stage renal disease: Secondary | ICD-10-CM | POA: Diagnosis not present

## 2019-08-18 DIAGNOSIS — Z992 Dependence on renal dialysis: Secondary | ICD-10-CM | POA: Diagnosis not present

## 2019-08-18 DIAGNOSIS — R519 Headache, unspecified: Secondary | ICD-10-CM | POA: Diagnosis not present

## 2019-08-18 DIAGNOSIS — D509 Iron deficiency anemia, unspecified: Secondary | ICD-10-CM | POA: Diagnosis not present

## 2019-08-18 DIAGNOSIS — N2581 Secondary hyperparathyroidism of renal origin: Secondary | ICD-10-CM | POA: Diagnosis not present

## 2019-08-18 DIAGNOSIS — Z794 Long term (current) use of insulin: Secondary | ICD-10-CM | POA: Diagnosis not present

## 2019-08-19 DIAGNOSIS — D509 Iron deficiency anemia, unspecified: Secondary | ICD-10-CM | POA: Diagnosis not present

## 2019-08-19 DIAGNOSIS — Z992 Dependence on renal dialysis: Secondary | ICD-10-CM | POA: Diagnosis not present

## 2019-08-19 DIAGNOSIS — N2581 Secondary hyperparathyroidism of renal origin: Secondary | ICD-10-CM | POA: Diagnosis not present

## 2019-08-19 DIAGNOSIS — R519 Headache, unspecified: Secondary | ICD-10-CM | POA: Diagnosis not present

## 2019-08-19 DIAGNOSIS — Z794 Long term (current) use of insulin: Secondary | ICD-10-CM | POA: Diagnosis not present

## 2019-08-19 DIAGNOSIS — N186 End stage renal disease: Secondary | ICD-10-CM | POA: Diagnosis not present

## 2019-08-22 DIAGNOSIS — R252 Cramp and spasm: Secondary | ICD-10-CM | POA: Diagnosis not present

## 2019-08-22 DIAGNOSIS — N186 End stage renal disease: Secondary | ICD-10-CM | POA: Diagnosis not present

## 2019-08-22 DIAGNOSIS — Z992 Dependence on renal dialysis: Secondary | ICD-10-CM | POA: Diagnosis not present

## 2019-08-22 DIAGNOSIS — R519 Headache, unspecified: Secondary | ICD-10-CM | POA: Diagnosis not present

## 2019-08-22 DIAGNOSIS — N2581 Secondary hyperparathyroidism of renal origin: Secondary | ICD-10-CM | POA: Diagnosis not present

## 2019-08-22 DIAGNOSIS — I953 Hypotension of hemodialysis: Secondary | ICD-10-CM | POA: Diagnosis not present

## 2019-08-22 DIAGNOSIS — T8610 Unspecified complication of kidney transplant: Secondary | ICD-10-CM | POA: Diagnosis not present

## 2019-08-22 DIAGNOSIS — I120 Hypertensive chronic kidney disease with stage 5 chronic kidney disease or end stage renal disease: Secondary | ICD-10-CM | POA: Diagnosis not present

## 2019-08-22 DIAGNOSIS — D509 Iron deficiency anemia, unspecified: Secondary | ICD-10-CM | POA: Diagnosis not present

## 2019-08-22 DIAGNOSIS — Z794 Long term (current) use of insulin: Secondary | ICD-10-CM | POA: Diagnosis not present

## 2019-08-22 DIAGNOSIS — E1122 Type 2 diabetes mellitus with diabetic chronic kidney disease: Secondary | ICD-10-CM | POA: Diagnosis not present

## 2019-08-24 DIAGNOSIS — Z992 Dependence on renal dialysis: Secondary | ICD-10-CM | POA: Diagnosis not present

## 2019-08-24 DIAGNOSIS — Z794 Long term (current) use of insulin: Secondary | ICD-10-CM | POA: Diagnosis not present

## 2019-08-24 DIAGNOSIS — R519 Headache, unspecified: Secondary | ICD-10-CM | POA: Diagnosis not present

## 2019-08-24 DIAGNOSIS — M109 Gout, unspecified: Secondary | ICD-10-CM | POA: Diagnosis not present

## 2019-08-24 DIAGNOSIS — N186 End stage renal disease: Secondary | ICD-10-CM | POA: Diagnosis not present

## 2019-08-24 DIAGNOSIS — N2581 Secondary hyperparathyroidism of renal origin: Secondary | ICD-10-CM | POA: Diagnosis not present

## 2019-08-24 DIAGNOSIS — D509 Iron deficiency anemia, unspecified: Secondary | ICD-10-CM | POA: Diagnosis not present

## 2019-08-25 DIAGNOSIS — R519 Headache, unspecified: Secondary | ICD-10-CM | POA: Diagnosis not present

## 2019-08-25 DIAGNOSIS — Z992 Dependence on renal dialysis: Secondary | ICD-10-CM | POA: Diagnosis not present

## 2019-08-25 DIAGNOSIS — D509 Iron deficiency anemia, unspecified: Secondary | ICD-10-CM | POA: Diagnosis not present

## 2019-08-25 DIAGNOSIS — N2581 Secondary hyperparathyroidism of renal origin: Secondary | ICD-10-CM | POA: Diagnosis not present

## 2019-08-25 DIAGNOSIS — Z794 Long term (current) use of insulin: Secondary | ICD-10-CM | POA: Diagnosis not present

## 2019-08-25 DIAGNOSIS — N186 End stage renal disease: Secondary | ICD-10-CM | POA: Diagnosis not present

## 2019-08-26 DIAGNOSIS — Z794 Long term (current) use of insulin: Secondary | ICD-10-CM | POA: Diagnosis not present

## 2019-08-26 DIAGNOSIS — D509 Iron deficiency anemia, unspecified: Secondary | ICD-10-CM | POA: Diagnosis not present

## 2019-08-26 DIAGNOSIS — Z992 Dependence on renal dialysis: Secondary | ICD-10-CM | POA: Diagnosis not present

## 2019-08-26 DIAGNOSIS — R519 Headache, unspecified: Secondary | ICD-10-CM | POA: Diagnosis not present

## 2019-08-26 DIAGNOSIS — N186 End stage renal disease: Secondary | ICD-10-CM | POA: Diagnosis not present

## 2019-08-26 DIAGNOSIS — N2581 Secondary hyperparathyroidism of renal origin: Secondary | ICD-10-CM | POA: Diagnosis not present

## 2019-08-29 DIAGNOSIS — R519 Headache, unspecified: Secondary | ICD-10-CM | POA: Diagnosis not present

## 2019-08-29 DIAGNOSIS — Z794 Long term (current) use of insulin: Secondary | ICD-10-CM | POA: Diagnosis not present

## 2019-08-29 DIAGNOSIS — Z992 Dependence on renal dialysis: Secondary | ICD-10-CM | POA: Diagnosis not present

## 2019-08-29 DIAGNOSIS — N2581 Secondary hyperparathyroidism of renal origin: Secondary | ICD-10-CM | POA: Diagnosis not present

## 2019-08-29 DIAGNOSIS — N186 End stage renal disease: Secondary | ICD-10-CM | POA: Diagnosis not present

## 2019-08-29 DIAGNOSIS — D509 Iron deficiency anemia, unspecified: Secondary | ICD-10-CM | POA: Diagnosis not present

## 2019-08-31 DIAGNOSIS — Z992 Dependence on renal dialysis: Secondary | ICD-10-CM | POA: Diagnosis not present

## 2019-08-31 DIAGNOSIS — N2581 Secondary hyperparathyroidism of renal origin: Secondary | ICD-10-CM | POA: Diagnosis not present

## 2019-08-31 DIAGNOSIS — Z794 Long term (current) use of insulin: Secondary | ICD-10-CM | POA: Diagnosis not present

## 2019-08-31 DIAGNOSIS — N186 End stage renal disease: Secondary | ICD-10-CM | POA: Diagnosis not present

## 2019-08-31 DIAGNOSIS — D509 Iron deficiency anemia, unspecified: Secondary | ICD-10-CM | POA: Diagnosis not present

## 2019-08-31 DIAGNOSIS — R519 Headache, unspecified: Secondary | ICD-10-CM | POA: Diagnosis not present

## 2019-09-01 DIAGNOSIS — D509 Iron deficiency anemia, unspecified: Secondary | ICD-10-CM | POA: Diagnosis not present

## 2019-09-01 DIAGNOSIS — Z794 Long term (current) use of insulin: Secondary | ICD-10-CM | POA: Diagnosis not present

## 2019-09-01 DIAGNOSIS — R519 Headache, unspecified: Secondary | ICD-10-CM | POA: Diagnosis not present

## 2019-09-01 DIAGNOSIS — N2581 Secondary hyperparathyroidism of renal origin: Secondary | ICD-10-CM | POA: Diagnosis not present

## 2019-09-01 DIAGNOSIS — Z992 Dependence on renal dialysis: Secondary | ICD-10-CM | POA: Diagnosis not present

## 2019-09-01 DIAGNOSIS — N186 End stage renal disease: Secondary | ICD-10-CM | POA: Diagnosis not present

## 2019-09-02 DIAGNOSIS — Z992 Dependence on renal dialysis: Secondary | ICD-10-CM | POA: Diagnosis not present

## 2019-09-02 DIAGNOSIS — Z794 Long term (current) use of insulin: Secondary | ICD-10-CM | POA: Diagnosis not present

## 2019-09-02 DIAGNOSIS — N186 End stage renal disease: Secondary | ICD-10-CM | POA: Diagnosis not present

## 2019-09-02 DIAGNOSIS — R519 Headache, unspecified: Secondary | ICD-10-CM | POA: Diagnosis not present

## 2019-09-02 DIAGNOSIS — D509 Iron deficiency anemia, unspecified: Secondary | ICD-10-CM | POA: Diagnosis not present

## 2019-09-02 DIAGNOSIS — N2581 Secondary hyperparathyroidism of renal origin: Secondary | ICD-10-CM | POA: Diagnosis not present

## 2019-09-05 DIAGNOSIS — N2581 Secondary hyperparathyroidism of renal origin: Secondary | ICD-10-CM | POA: Diagnosis not present

## 2019-09-05 DIAGNOSIS — Z794 Long term (current) use of insulin: Secondary | ICD-10-CM | POA: Diagnosis not present

## 2019-09-05 DIAGNOSIS — N186 End stage renal disease: Secondary | ICD-10-CM | POA: Diagnosis not present

## 2019-09-05 DIAGNOSIS — R519 Headache, unspecified: Secondary | ICD-10-CM | POA: Diagnosis not present

## 2019-09-05 DIAGNOSIS — Z992 Dependence on renal dialysis: Secondary | ICD-10-CM | POA: Diagnosis not present

## 2019-09-05 DIAGNOSIS — D509 Iron deficiency anemia, unspecified: Secondary | ICD-10-CM | POA: Diagnosis not present

## 2019-09-07 DIAGNOSIS — Z992 Dependence on renal dialysis: Secondary | ICD-10-CM | POA: Diagnosis not present

## 2019-09-07 DIAGNOSIS — Z794 Long term (current) use of insulin: Secondary | ICD-10-CM | POA: Diagnosis not present

## 2019-09-07 DIAGNOSIS — R519 Headache, unspecified: Secondary | ICD-10-CM | POA: Diagnosis not present

## 2019-09-07 DIAGNOSIS — N2581 Secondary hyperparathyroidism of renal origin: Secondary | ICD-10-CM | POA: Diagnosis not present

## 2019-09-07 DIAGNOSIS — D509 Iron deficiency anemia, unspecified: Secondary | ICD-10-CM | POA: Diagnosis not present

## 2019-09-07 DIAGNOSIS — N186 End stage renal disease: Secondary | ICD-10-CM | POA: Diagnosis not present

## 2019-09-08 DIAGNOSIS — N186 End stage renal disease: Secondary | ICD-10-CM | POA: Diagnosis not present

## 2019-09-08 DIAGNOSIS — R519 Headache, unspecified: Secondary | ICD-10-CM | POA: Diagnosis not present

## 2019-09-08 DIAGNOSIS — Z992 Dependence on renal dialysis: Secondary | ICD-10-CM | POA: Diagnosis not present

## 2019-09-08 DIAGNOSIS — Z794 Long term (current) use of insulin: Secondary | ICD-10-CM | POA: Diagnosis not present

## 2019-09-08 DIAGNOSIS — D509 Iron deficiency anemia, unspecified: Secondary | ICD-10-CM | POA: Diagnosis not present

## 2019-09-08 DIAGNOSIS — N2581 Secondary hyperparathyroidism of renal origin: Secondary | ICD-10-CM | POA: Diagnosis not present

## 2019-09-09 DIAGNOSIS — Z992 Dependence on renal dialysis: Secondary | ICD-10-CM | POA: Diagnosis not present

## 2019-09-09 DIAGNOSIS — D509 Iron deficiency anemia, unspecified: Secondary | ICD-10-CM | POA: Diagnosis not present

## 2019-09-09 DIAGNOSIS — N186 End stage renal disease: Secondary | ICD-10-CM | POA: Diagnosis not present

## 2019-09-09 DIAGNOSIS — Z794 Long term (current) use of insulin: Secondary | ICD-10-CM | POA: Diagnosis not present

## 2019-09-09 DIAGNOSIS — R519 Headache, unspecified: Secondary | ICD-10-CM | POA: Diagnosis not present

## 2019-09-09 DIAGNOSIS — N2581 Secondary hyperparathyroidism of renal origin: Secondary | ICD-10-CM | POA: Diagnosis not present

## 2019-09-12 DIAGNOSIS — N186 End stage renal disease: Secondary | ICD-10-CM | POA: Diagnosis not present

## 2019-09-12 DIAGNOSIS — Z992 Dependence on renal dialysis: Secondary | ICD-10-CM | POA: Diagnosis not present

## 2019-09-12 DIAGNOSIS — N2581 Secondary hyperparathyroidism of renal origin: Secondary | ICD-10-CM | POA: Diagnosis not present

## 2019-09-12 DIAGNOSIS — D509 Iron deficiency anemia, unspecified: Secondary | ICD-10-CM | POA: Diagnosis not present

## 2019-09-12 DIAGNOSIS — Z794 Long term (current) use of insulin: Secondary | ICD-10-CM | POA: Diagnosis not present

## 2019-09-12 DIAGNOSIS — R519 Headache, unspecified: Secondary | ICD-10-CM | POA: Diagnosis not present

## 2019-09-14 DIAGNOSIS — N2581 Secondary hyperparathyroidism of renal origin: Secondary | ICD-10-CM | POA: Diagnosis not present

## 2019-09-14 DIAGNOSIS — N186 End stage renal disease: Secondary | ICD-10-CM | POA: Diagnosis not present

## 2019-09-14 DIAGNOSIS — D509 Iron deficiency anemia, unspecified: Secondary | ICD-10-CM | POA: Diagnosis not present

## 2019-09-14 DIAGNOSIS — R519 Headache, unspecified: Secondary | ICD-10-CM | POA: Diagnosis not present

## 2019-09-14 DIAGNOSIS — Z794 Long term (current) use of insulin: Secondary | ICD-10-CM | POA: Diagnosis not present

## 2019-09-14 DIAGNOSIS — Z992 Dependence on renal dialysis: Secondary | ICD-10-CM | POA: Diagnosis not present

## 2019-09-15 DIAGNOSIS — N186 End stage renal disease: Secondary | ICD-10-CM | POA: Diagnosis not present

## 2019-09-15 DIAGNOSIS — D509 Iron deficiency anemia, unspecified: Secondary | ICD-10-CM | POA: Diagnosis not present

## 2019-09-15 DIAGNOSIS — Z794 Long term (current) use of insulin: Secondary | ICD-10-CM | POA: Diagnosis not present

## 2019-09-15 DIAGNOSIS — Z992 Dependence on renal dialysis: Secondary | ICD-10-CM | POA: Diagnosis not present

## 2019-09-15 DIAGNOSIS — N2581 Secondary hyperparathyroidism of renal origin: Secondary | ICD-10-CM | POA: Diagnosis not present

## 2019-09-15 DIAGNOSIS — R519 Headache, unspecified: Secondary | ICD-10-CM | POA: Diagnosis not present

## 2019-09-16 DIAGNOSIS — N186 End stage renal disease: Secondary | ICD-10-CM | POA: Diagnosis not present

## 2019-09-16 DIAGNOSIS — R519 Headache, unspecified: Secondary | ICD-10-CM | POA: Diagnosis not present

## 2019-09-16 DIAGNOSIS — Z992 Dependence on renal dialysis: Secondary | ICD-10-CM | POA: Diagnosis not present

## 2019-09-16 DIAGNOSIS — N2581 Secondary hyperparathyroidism of renal origin: Secondary | ICD-10-CM | POA: Diagnosis not present

## 2019-09-16 DIAGNOSIS — Z794 Long term (current) use of insulin: Secondary | ICD-10-CM | POA: Diagnosis not present

## 2019-09-16 DIAGNOSIS — D509 Iron deficiency anemia, unspecified: Secondary | ICD-10-CM | POA: Diagnosis not present

## 2019-09-19 DIAGNOSIS — N186 End stage renal disease: Secondary | ICD-10-CM | POA: Diagnosis not present

## 2019-09-19 DIAGNOSIS — D509 Iron deficiency anemia, unspecified: Secondary | ICD-10-CM | POA: Diagnosis not present

## 2019-09-19 DIAGNOSIS — Z794 Long term (current) use of insulin: Secondary | ICD-10-CM | POA: Diagnosis not present

## 2019-09-19 DIAGNOSIS — Z992 Dependence on renal dialysis: Secondary | ICD-10-CM | POA: Diagnosis not present

## 2019-09-19 DIAGNOSIS — N2581 Secondary hyperparathyroidism of renal origin: Secondary | ICD-10-CM | POA: Diagnosis not present

## 2019-09-19 DIAGNOSIS — R519 Headache, unspecified: Secondary | ICD-10-CM | POA: Diagnosis not present

## 2019-09-21 DIAGNOSIS — D509 Iron deficiency anemia, unspecified: Secondary | ICD-10-CM | POA: Diagnosis not present

## 2019-09-21 DIAGNOSIS — Z794 Long term (current) use of insulin: Secondary | ICD-10-CM | POA: Diagnosis not present

## 2019-09-21 DIAGNOSIS — N2581 Secondary hyperparathyroidism of renal origin: Secondary | ICD-10-CM | POA: Diagnosis not present

## 2019-09-21 DIAGNOSIS — N186 End stage renal disease: Secondary | ICD-10-CM | POA: Diagnosis not present

## 2019-09-21 DIAGNOSIS — Z992 Dependence on renal dialysis: Secondary | ICD-10-CM | POA: Diagnosis not present

## 2019-09-21 DIAGNOSIS — R519 Headache, unspecified: Secondary | ICD-10-CM | POA: Diagnosis not present

## 2019-09-22 DIAGNOSIS — I953 Hypotension of hemodialysis: Secondary | ICD-10-CM | POA: Diagnosis not present

## 2019-09-22 DIAGNOSIS — E1122 Type 2 diabetes mellitus with diabetic chronic kidney disease: Secondary | ICD-10-CM | POA: Diagnosis not present

## 2019-09-22 DIAGNOSIS — Z992 Dependence on renal dialysis: Secondary | ICD-10-CM | POA: Diagnosis not present

## 2019-09-22 DIAGNOSIS — N186 End stage renal disease: Secondary | ICD-10-CM | POA: Diagnosis not present

## 2019-09-22 DIAGNOSIS — T8610 Unspecified complication of kidney transplant: Secondary | ICD-10-CM | POA: Diagnosis not present

## 2019-09-22 DIAGNOSIS — Z794 Long term (current) use of insulin: Secondary | ICD-10-CM | POA: Diagnosis not present

## 2019-09-22 DIAGNOSIS — R519 Headache, unspecified: Secondary | ICD-10-CM | POA: Diagnosis not present

## 2019-09-22 DIAGNOSIS — N2581 Secondary hyperparathyroidism of renal origin: Secondary | ICD-10-CM | POA: Diagnosis not present

## 2019-09-22 DIAGNOSIS — D509 Iron deficiency anemia, unspecified: Secondary | ICD-10-CM | POA: Diagnosis not present

## 2019-09-22 DIAGNOSIS — R252 Cramp and spasm: Secondary | ICD-10-CM | POA: Diagnosis not present

## 2019-09-22 DIAGNOSIS — I120 Hypertensive chronic kidney disease with stage 5 chronic kidney disease or end stage renal disease: Secondary | ICD-10-CM | POA: Diagnosis not present

## 2019-09-23 DIAGNOSIS — D509 Iron deficiency anemia, unspecified: Secondary | ICD-10-CM | POA: Diagnosis not present

## 2019-09-23 DIAGNOSIS — Z992 Dependence on renal dialysis: Secondary | ICD-10-CM | POA: Diagnosis not present

## 2019-09-23 DIAGNOSIS — R519 Headache, unspecified: Secondary | ICD-10-CM | POA: Diagnosis not present

## 2019-09-23 DIAGNOSIS — N2581 Secondary hyperparathyroidism of renal origin: Secondary | ICD-10-CM | POA: Diagnosis not present

## 2019-09-23 DIAGNOSIS — Z794 Long term (current) use of insulin: Secondary | ICD-10-CM | POA: Diagnosis not present

## 2019-09-23 DIAGNOSIS — N186 End stage renal disease: Secondary | ICD-10-CM | POA: Diagnosis not present

## 2019-09-26 DIAGNOSIS — Z992 Dependence on renal dialysis: Secondary | ICD-10-CM | POA: Diagnosis not present

## 2019-09-26 DIAGNOSIS — E1129 Type 2 diabetes mellitus with other diabetic kidney complication: Secondary | ICD-10-CM | POA: Diagnosis not present

## 2019-09-26 DIAGNOSIS — N186 End stage renal disease: Secondary | ICD-10-CM | POA: Diagnosis not present

## 2019-09-26 DIAGNOSIS — N2581 Secondary hyperparathyroidism of renal origin: Secondary | ICD-10-CM | POA: Diagnosis not present

## 2019-09-26 DIAGNOSIS — Z794 Long term (current) use of insulin: Secondary | ICD-10-CM | POA: Diagnosis not present

## 2019-09-26 DIAGNOSIS — R519 Headache, unspecified: Secondary | ICD-10-CM | POA: Diagnosis not present

## 2019-09-26 DIAGNOSIS — D509 Iron deficiency anemia, unspecified: Secondary | ICD-10-CM | POA: Diagnosis not present

## 2019-09-26 DIAGNOSIS — M109 Gout, unspecified: Secondary | ICD-10-CM | POA: Diagnosis not present

## 2019-09-27 ENCOUNTER — Other Ambulatory Visit: Payer: Self-pay | Admitting: *Deleted

## 2019-09-27 DIAGNOSIS — N186 End stage renal disease: Secondary | ICD-10-CM

## 2019-09-28 ENCOUNTER — Ambulatory Visit (HOSPITAL_COMMUNITY)
Admission: RE | Admit: 2019-09-28 | Discharge: 2019-09-28 | Disposition: A | Discharge status: Home / Self Care | Payer: Medicare Other | Source: Ambulatory Visit | Attending: Vascular Surgery | Admitting: Vascular Surgery

## 2019-09-28 ENCOUNTER — Encounter: Payer: Self-pay | Admitting: Vascular Surgery

## 2019-09-28 ENCOUNTER — Ambulatory Visit (INDEPENDENT_AMBULATORY_CARE_PROVIDER_SITE_OTHER): Payer: Medicare Other | Admitting: Vascular Surgery

## 2019-09-28 ENCOUNTER — Other Ambulatory Visit: Payer: Self-pay

## 2019-09-28 VITALS — BP 153/75 | HR 61 | Temp 97.9°F | Resp 20 | Ht 66.0 in | Wt 167.0 lb

## 2019-09-28 DIAGNOSIS — Z794 Long term (current) use of insulin: Secondary | ICD-10-CM | POA: Diagnosis not present

## 2019-09-28 DIAGNOSIS — N186 End stage renal disease: Secondary | ICD-10-CM

## 2019-09-28 DIAGNOSIS — Z992 Dependence on renal dialysis: Secondary | ICD-10-CM

## 2019-09-28 DIAGNOSIS — N2581 Secondary hyperparathyroidism of renal origin: Secondary | ICD-10-CM | POA: Diagnosis not present

## 2019-09-28 DIAGNOSIS — D509 Iron deficiency anemia, unspecified: Secondary | ICD-10-CM | POA: Diagnosis not present

## 2019-09-28 DIAGNOSIS — R519 Headache, unspecified: Secondary | ICD-10-CM | POA: Diagnosis not present

## 2019-09-28 NOTE — Progress Notes (Signed)
Patient name: Kimberly [name] MRN: [id number] DOB: 08/02/1955 Sex: female  REASON FOR VISIT:   Referred by nephrology, requesting fistulogram due to inability to cannulate area or aneurysms were removed.  HPI:   Kimberly [name] is a pleasant 64 y.o. female who does home dialysis on Saturday, Monday, Wednesday, and Thursday.  She has a large aneurysmal left brachiocephalic fistula.  Most recently, on 05/18/2019 she underwent venoplasty of a cephalic vein arch stenosis and excision of an aneurysm in her upper arm by Dr. Donzetta Matters.  She was having problems cannulating the area where the fistula was revised and therefore was sticking her large aneurysm.  She was referred by nephrology for a fistulogram and possible revision of her fistula.  She denies pain or paresthesias in her left arm.  Current Outpatient Medications  Medication Sig Dispense Refill  . acetaminophen (TYLENOL) 500 MG tablet Take 500-1,000 mg by mouth every 8 (eight) hours as needed for mild pain or headache (depends on pain if takes 1-2 tablets).    Marland Kitchen amLODipine (NORVASC) 10 MG tablet Take 10 mg by mouth every evening.     . B-D ULTRAFINE III SHORT PEN 31G X 8 MM MISC Use twice daily as directed  0  . calcitRIOL (ROCALTROL) 0.5 MCG capsule Take 1.5 mcg by mouth daily.     . cinacalcet (SENSIPAR) 30 MG tablet Take 30 mg by mouth daily.    Marland Kitchen ezetimibe-simvastatin (VYTORIN) 10-20 MG per tablet Take 1 tablet by mouth at bedtime.    . ferric citrate (AURYXIA) 1 GM 210 MG(Fe) tablet Take 210 mg by mouth 3 (three) times daily with meals.     Marland Kitchen labetalol (NORMODYNE) 200 MG tablet Take 1 tablet (200 mg total) by mouth 2 (two) times daily. (Patient taking differently: Take 300 mg by mouth 2 (two) times daily. ) 60 tablet 0  . omeprazole (PRILOSEC) 40 MG capsule Take 40 mg by mouth daily.  0  . predniSONE (DELTASONE) 5 MG tablet Take 5 mg by mouth daily.    Nelva Nay MAX SOLOSTAR 300 UNIT/ML SOPN Inject 8 Units into the skin every evening.      Marland Kitchen amoxicillin (AMOXIL) 500 MG capsule Take 2,000 mg by mouth See admin instructions. Take 4 capsules (2000 mg) by mouth 1 hour prior to dental work    . HYDROcodone-acetaminophen (NORCO) 5-325 MG tablet Take 1 tablet by mouth every 6 (six) hours as needed for moderate pain. (Patient not taking: Reported on 09/28/2019) 20 tablet 0   No current facility-administered medications for this visit.    REVIEW OF SYSTEMS:  [X]  denotes positive finding, [ ]  denotes negative finding Vascular    Leg swelling    Cardiac    Chest pain or chest pressure:    Shortness of breath upon exertion:    Short of breath when lying flat:    Irregular heart rhythm:    Constitutional    Fever or chills:     PHYSICAL EXAM:   Vitals:   09/28/19 0852  BP: (!) 153/75  Pulse: 61  Resp: 20  Temp: 97.9 F (36.6 C)  SpO2: 96%  Weight: 167 lb (75.8 kg)  Height: 5\' 6"  (1.676 m)    GENERAL: The patient is a well-nourished female, in no acute distress. The vital signs are documented above. CARDIOVASCULAR: There is a regular rate and rhythm. PULMONARY: There is good air exchange bilaterally without wheezing or rales. Her fistula has a palpable thrill and is slightly pulsatile.  She has a large aneurysmal segment proximally over two thirds of the fistula as documented below.   She has some extensive collaterals around her shoulder as documented below.     DATA:   DUPLEX AV FISTULA: I have independently interpreted her duplex of her AV fistula today.  The mid and distal segment of the fistula are markedly aneurysmal up to 3.5 cm.  There are some elevated velocities noted at the proximal anastomosis.  MEDICAL ISSUES:   ANEURYSMAL LEFT UPPER ARM FISTULA: This patient has a markedly aneurysmal left brachiocephalic fistula.  The more central portion has been revised but she is having a hard time cannulating this.  I explained that if we simply plicate this large aneurysm she will not have anywhere to access her  fistula and would require placement of a catheter.  We also discussed potentially placing access in the right arm so that this would be ready to go once the left arm fistula could no longer be used.  In an attempt to salvage this fistula I have recommended that we proceed with a fistulogram before deciding what the next steps are.  Offered to do this Friday but she is headed to the beach so we will arrange to do this on Monday.  I can schedule plication of her fistula once her fistulogram is complete however she would likely require placement of a catheter which currently she is very reluctant to consider.  I will make further recommendations pending results of her fistulogram.  Deitra Mayo [facility]

## 2019-09-29 ENCOUNTER — Other Ambulatory Visit (HOSPITAL_COMMUNITY)
Admission: RE | Admit: 2019-09-29 | Discharge: 2019-09-29 | Disposition: A | Discharge status: Home / Self Care | Payer: Medicare Other | Source: Ambulatory Visit | Attending: Vascular Surgery | Admitting: Vascular Surgery

## 2019-09-29 DIAGNOSIS — N2581 Secondary hyperparathyroidism of renal origin: Secondary | ICD-10-CM | POA: Diagnosis not present

## 2019-09-29 DIAGNOSIS — Z794 Long term (current) use of insulin: Secondary | ICD-10-CM | POA: Diagnosis not present

## 2019-09-29 DIAGNOSIS — Z01812 Encounter for preprocedural laboratory examination: Secondary | ICD-10-CM | POA: Insufficient documentation

## 2019-09-29 DIAGNOSIS — D509 Iron deficiency anemia, unspecified: Secondary | ICD-10-CM | POA: Diagnosis not present

## 2019-09-29 DIAGNOSIS — Z20822 Contact with and (suspected) exposure to covid-19: Secondary | ICD-10-CM | POA: Insufficient documentation

## 2019-09-29 DIAGNOSIS — N186 End stage renal disease: Secondary | ICD-10-CM | POA: Diagnosis not present

## 2019-09-29 DIAGNOSIS — R519 Headache, unspecified: Secondary | ICD-10-CM | POA: Diagnosis not present

## 2019-09-29 DIAGNOSIS — Z992 Dependence on renal dialysis: Secondary | ICD-10-CM | POA: Diagnosis not present

## 2019-09-29 LAB — SARS CORONAVIRUS 2 (TAT 6-24 HRS): SARS Coronavirus 2: NEGATIVE

## 2019-09-30 DIAGNOSIS — R519 Headache, unspecified: Secondary | ICD-10-CM | POA: Diagnosis not present

## 2019-09-30 DIAGNOSIS — Z794 Long term (current) use of insulin: Secondary | ICD-10-CM | POA: Diagnosis not present

## 2019-09-30 DIAGNOSIS — N2581 Secondary hyperparathyroidism of renal origin: Secondary | ICD-10-CM | POA: Diagnosis not present

## 2019-09-30 DIAGNOSIS — Z992 Dependence on renal dialysis: Secondary | ICD-10-CM | POA: Diagnosis not present

## 2019-09-30 DIAGNOSIS — D509 Iron deficiency anemia, unspecified: Secondary | ICD-10-CM | POA: Diagnosis not present

## 2019-09-30 DIAGNOSIS — N186 End stage renal disease: Secondary | ICD-10-CM | POA: Diagnosis not present

## 2019-10-03 ENCOUNTER — Encounter (HOSPITAL_COMMUNITY)
Admission: RE | Disposition: A | Discharge status: Home / Self Care | Payer: Self-pay | Source: Home / Self Care | Attending: Vascular Surgery

## 2019-10-03 ENCOUNTER — Ambulatory Visit (HOSPITAL_COMMUNITY)
Admission: RE | Admit: 2019-10-03 | Discharge: 2019-10-03 | Disposition: A | Discharge status: Home / Self Care | Payer: Medicare Other | Attending: Vascular Surgery | Admitting: Vascular Surgery

## 2019-10-03 ENCOUNTER — Other Ambulatory Visit: Payer: Self-pay

## 2019-10-03 DIAGNOSIS — Z992 Dependence on renal dialysis: Secondary | ICD-10-CM | POA: Insufficient documentation

## 2019-10-03 DIAGNOSIS — Y832 Surgical operation with anastomosis, bypass or graft as the cause of abnormal reaction of the patient, or of later complication, without mention of misadventure at the time of the procedure: Secondary | ICD-10-CM | POA: Diagnosis not present

## 2019-10-03 DIAGNOSIS — Z794 Long term (current) use of insulin: Secondary | ICD-10-CM | POA: Insufficient documentation

## 2019-10-03 DIAGNOSIS — T82858A Stenosis of vascular prosthetic devices, implants and grafts, initial encounter: Secondary | ICD-10-CM | POA: Diagnosis not present

## 2019-10-03 DIAGNOSIS — Z79899 Other long term (current) drug therapy: Secondary | ICD-10-CM | POA: Insufficient documentation

## 2019-10-03 DIAGNOSIS — N2581 Secondary hyperparathyroidism of renal origin: Secondary | ICD-10-CM | POA: Diagnosis not present

## 2019-10-03 DIAGNOSIS — N186 End stage renal disease: Secondary | ICD-10-CM | POA: Diagnosis not present

## 2019-10-03 DIAGNOSIS — R519 Headache, unspecified: Secondary | ICD-10-CM | POA: Diagnosis not present

## 2019-10-03 DIAGNOSIS — D509 Iron deficiency anemia, unspecified: Secondary | ICD-10-CM | POA: Diagnosis not present

## 2019-10-03 DIAGNOSIS — T82898A Other specified complication of vascular prosthetic devices, implants and grafts, initial encounter: Secondary | ICD-10-CM

## 2019-10-03 HISTORY — PX: PERIPHERAL VASCULAR BALLOON ANGIOPLASTY: CATH118281

## 2019-10-03 HISTORY — PX: A/V FISTULAGRAM: CATH118298

## 2019-10-03 LAB — POCT I-STAT, CHEM 8
BUN: 46 mg/dL — ABNORMAL HIGH (ref 8–23)
Calcium, Ion: 1.04 mmol/L — ABNORMAL LOW (ref 1.15–1.40)
Chloride: 101 mmol/L (ref 98–111)
Creatinine, Ser: 9 mg/dL — ABNORMAL HIGH (ref 0.44–1.00)
Glucose, Bld: 100 mg/dL — ABNORMAL HIGH (ref 70–99)
HCT: 32 % — ABNORMAL LOW (ref 36.0–46.0)
Hemoglobin: 10.9 g/dL — ABNORMAL LOW (ref 12.0–15.0)
Potassium: 3.4 mmol/L — ABNORMAL LOW (ref 3.5–5.1)
Sodium: 142 mmol/L (ref 135–145)
TCO2: 30 mmol/L (ref 22–32)

## 2019-10-03 SURGERY — A/V FISTULAGRAM
Anesthesia: LOCAL

## 2019-10-03 MED ORDER — LABETALOL HCL 200 MG PO TABS: 200.0000 mg | ORAL_TABLET | Freq: Two times a day (BID) | ORAL | Status: DC

## 2019-10-03 MED ORDER — SODIUM CHLORIDE 0.9 % IV SOLN: 250.0000 mL | INTRAVENOUS | Status: DC | PRN

## 2019-10-03 MED ORDER — LIDOCAINE HCL (PF) 1 % IJ SOLN
INTRAMUSCULAR | Status: DC | PRN
  Administered 2019-10-03: 2 mL

## 2019-10-03 MED ORDER — CINACALCET HCL 30 MG PO TABS: 30.0000 mg | ORAL_TABLET | Freq: Every day | ORAL | Status: DC

## 2019-10-03 MED ORDER — FERRIC CITRATE 1 GM 210 MG(FE) PO TABS: 210.0000 mg | ORAL_TABLET | Freq: Three times a day (TID) | ORAL | Status: DC

## 2019-10-03 MED ORDER — CALCITRIOL 0.5 MCG PO CAPS: 1.5000 ug | ORAL_CAPSULE | Freq: Every day | ORAL | Status: DC

## 2019-10-03 MED ORDER — INSULIN GLARGINE (2 UNIT DIAL) 300 UNIT/ML ~~LOC~~ SOPN: 8.0000 [IU] | PEN_INJECTOR | Freq: Every evening | SUBCUTANEOUS | Status: DC

## 2019-10-03 MED ORDER — AMOXICILLIN 500 MG PO CAPS: 2000.0000 mg | ORAL_CAPSULE | ORAL | Status: DC

## 2019-10-03 MED ORDER — LIDOCAINE HCL (PF) 1 % IJ SOLN
INTRAMUSCULAR | Status: AC
  Filled 2019-10-03: qty 30

## 2019-10-03 MED ORDER — HEPARIN (PORCINE) IN NACL 1000-0.9 UT/500ML-% IV SOLN
INTRAVENOUS | Status: DC | PRN
  Administered 2019-10-03: 500 mL

## 2019-10-03 MED ORDER — HEPARIN (PORCINE) IN NACL 1000-0.9 UT/500ML-% IV SOLN
INTRAVENOUS | Status: AC
  Filled 2019-10-03: qty 500

## 2019-10-03 MED ORDER — SODIUM CHLORIDE 0.9% FLUSH: 3.0000 mL | Freq: Two times a day (BID) | INTRAVENOUS | Status: DC

## 2019-10-03 MED ORDER — SODIUM CHLORIDE 0.9% FLUSH: 3.0000 mL | INTRAVENOUS | Status: DC | PRN

## 2019-10-03 MED ORDER — PANTOPRAZOLE SODIUM 40 MG PO TBEC: 40.0000 mg | DELAYED_RELEASE_TABLET | Freq: Every day | ORAL | Status: DC

## 2019-10-03 MED ORDER — PREDNISONE 5 MG PO TABS: 5.0000 mg | ORAL_TABLET | Freq: Every day | ORAL | Status: DC

## 2019-10-03 MED ORDER — EZETIMIBE-SIMVASTATIN 10-20 MG PO TABS: 1.0000 | ORAL_TABLET | Freq: Every day | ORAL | Status: DC

## 2019-10-03 MED ORDER — ACETAMINOPHEN 500 MG PO TABS: 500.0000 mg | ORAL_TABLET | Freq: Three times a day (TID) | ORAL | Status: DC | PRN

## 2019-10-03 MED ORDER — AMLODIPINE BESYLATE 10 MG PO TABS: 10.0000 mg | ORAL_TABLET | Freq: Every evening | ORAL | Status: DC

## 2019-10-03 MED ORDER — IODIXANOL 320 MG/ML IV SOLN
INTRAVENOUS | Status: DC | PRN
  Administered 2019-10-03: 12:00:00 50 mL

## 2019-10-03 SURGICAL SUPPLY — 22 items
BAG SNAP BAND KOVER 36X36 (MISCELLANEOUS) ×3 IMPLANT
BALLN LUTONIX AV 10X60X75 (BALLOONS) ×3
BALLN MUSTANG 10X60X75 (BALLOONS) ×3
BALLOON LUTONIX AV 10X60X75 (BALLOONS) IMPLANT
BALLOON MUSTANG 10X60X75 (BALLOONS) IMPLANT
CATH ANGIO 5F BER2 65CM (CATHETERS) ×1 IMPLANT
COVER DOME SNAP 22 D (MISCELLANEOUS) ×3 IMPLANT
DEVICE TORQUE .025-.038 (MISCELLANEOUS) ×1 IMPLANT
GLIDEWIRE ANGLED SS 035X260CM (WIRE) ×1 IMPLANT
GUIDEWIRE ANGLED .035X150CM (WIRE) ×1 IMPLANT
KIT ENCORE 26 ADVANTAGE (KITS) ×1 IMPLANT
KIT MICROPUNCTURE NIT STIFF (SHEATH) ×1 IMPLANT
PROTECTION STATION PRESSURIZED (MISCELLANEOUS) ×3
SHEATH BRITE TIP 7FR 35CM (SHEATH) ×1 IMPLANT
SHEATH PINNACLE R/O II 7F 4CM (SHEATH) ×1 IMPLANT
SHEATH PROBE COVER 6X72 (BAG) ×4 IMPLANT
STATION PROTECTION PRESSURIZED (MISCELLANEOUS) ×2 IMPLANT
STOPCOCK MORSE 400PSI 3WAY (MISCELLANEOUS) ×3 IMPLANT
TRAY PV CATH (CUSTOM PROCEDURE TRAY) ×3 IMPLANT
TUBING CIL FLEX 10 FLL-RA (TUBING) ×3 IMPLANT
WIRE BENTSON .035X145CM (WIRE) ×1 IMPLANT
WIRE ROSEN-J .035X180CM (WIRE) ×1 IMPLANT

## 2019-10-03 NOTE — H&P (Signed)
   History and Physical Update  The patient was interviewed and re-examined.  The patient's previous History and Physical has been reviewed and is unchanged from previous visit. Plan for left arm fistulogram.   Vanetta Rule C. Donzetta Matters, MD [facility] [place] Office: [phone] Pager: [phone]    10/03/2019, 10:23 AM

## 2019-10-03 NOTE — Discharge Instructions (Signed)

## 2019-10-03 NOTE — Op Note (Signed)
    Patient name: Kimberly [name] MRN: [id number] DOB: [birth year] Sex: female  10/03/2019 Pre-operative Diagnosis: End-stage renal disease, malfunction left arm AV fistula Post-operative diagnosis:  Same Surgeon:  Eda Paschal. Donzetta Matters, MD Procedure Performed: 1.  Ultrasound-guided cannulation left arm AV fistula 2.  Fistulogram with central venogram 3.  Drug-coated balloon angioplasty cephalic arch with 10 mm balloon  Indications: 64 year old female with end-stage renal disease.  She has a left arm AV fistula this has been revised in the past.  She is now indicated for further revision but she cannot stick the most cephalad part she is first indicated for fistulogram.  Findings: There was a tight stenosis at the cephalic arch approximately 80 or 90%.  At completion this is resolved to less than 20%.  This is where she was previously had balloon angioplasty.  We will see if she can cannulate her fistula and then plan for revision.   Procedure:  The patient was identified in the holding area and taken to room 8.  The patient was then placed supine on the table and prepped and draped in the usual sterile fashion.  A time out was called.  Ultrasound was used to evaluate the left arm AV fistula.  This was cannulated with ultrasound guidance with micropuncture needle followed wire sheath.  Images saved the permanent record.  We perform left upper extremity fistulogram with the above findings.  We then ultimately had to place a long 7 French sheath up to the cephalic arch to cross the lesion.  We did this with very catheter and Glidewire.  We then placed a Rosen into the IVC.  We first dilated with 10 mm balloon.  Completion demonstrated improved blood flow.  We then performed drug-coated balloon angioplasty with 10 mm balloon.  Completion demonstrated brisk flow.  Cath and wire removed we suture-ligated the area with 4 Monocryl suture.  She will be considered for revision after she can cannulate the most cephalad  aspect of the fistula.   Contrast: 55cc  Andranik Jeune C. Donzetta Matters, MD [facility] [place] Office: [phone] Pager: [phone]

## 2019-10-05 DIAGNOSIS — Z794 Long term (current) use of insulin: Secondary | ICD-10-CM | POA: Diagnosis not present

## 2019-10-05 DIAGNOSIS — N186 End stage renal disease: Secondary | ICD-10-CM | POA: Diagnosis not present

## 2019-10-05 DIAGNOSIS — Z992 Dependence on renal dialysis: Secondary | ICD-10-CM | POA: Diagnosis not present

## 2019-10-05 DIAGNOSIS — R519 Headache, unspecified: Secondary | ICD-10-CM | POA: Diagnosis not present

## 2019-10-05 DIAGNOSIS — D509 Iron deficiency anemia, unspecified: Secondary | ICD-10-CM | POA: Diagnosis not present

## 2019-10-05 DIAGNOSIS — N2581 Secondary hyperparathyroidism of renal origin: Secondary | ICD-10-CM | POA: Diagnosis not present

## 2019-10-06 DIAGNOSIS — D509 Iron deficiency anemia, unspecified: Secondary | ICD-10-CM | POA: Diagnosis not present

## 2019-10-06 DIAGNOSIS — Z794 Long term (current) use of insulin: Secondary | ICD-10-CM | POA: Diagnosis not present

## 2019-10-06 DIAGNOSIS — Z992 Dependence on renal dialysis: Secondary | ICD-10-CM | POA: Diagnosis not present

## 2019-10-06 DIAGNOSIS — R519 Headache, unspecified: Secondary | ICD-10-CM | POA: Diagnosis not present

## 2019-10-06 DIAGNOSIS — N186 End stage renal disease: Secondary | ICD-10-CM | POA: Diagnosis not present

## 2019-10-06 DIAGNOSIS — N2581 Secondary hyperparathyroidism of renal origin: Secondary | ICD-10-CM | POA: Diagnosis not present

## 2019-10-07 DIAGNOSIS — R519 Headache, unspecified: Secondary | ICD-10-CM | POA: Diagnosis not present

## 2019-10-07 DIAGNOSIS — D509 Iron deficiency anemia, unspecified: Secondary | ICD-10-CM | POA: Diagnosis not present

## 2019-10-07 DIAGNOSIS — Z992 Dependence on renal dialysis: Secondary | ICD-10-CM | POA: Diagnosis not present

## 2019-10-07 DIAGNOSIS — Z794 Long term (current) use of insulin: Secondary | ICD-10-CM | POA: Diagnosis not present

## 2019-10-07 DIAGNOSIS — N186 End stage renal disease: Secondary | ICD-10-CM | POA: Diagnosis not present

## 2019-10-07 DIAGNOSIS — N2581 Secondary hyperparathyroidism of renal origin: Secondary | ICD-10-CM | POA: Diagnosis not present

## 2019-10-10 DIAGNOSIS — N186 End stage renal disease: Secondary | ICD-10-CM | POA: Diagnosis not present

## 2019-10-10 DIAGNOSIS — N2581 Secondary hyperparathyroidism of renal origin: Secondary | ICD-10-CM | POA: Diagnosis not present

## 2019-10-10 DIAGNOSIS — Z992 Dependence on renal dialysis: Secondary | ICD-10-CM | POA: Diagnosis not present

## 2019-10-10 DIAGNOSIS — R519 Headache, unspecified: Secondary | ICD-10-CM | POA: Diagnosis not present

## 2019-10-10 DIAGNOSIS — Z794 Long term (current) use of insulin: Secondary | ICD-10-CM | POA: Diagnosis not present

## 2019-10-10 DIAGNOSIS — D509 Iron deficiency anemia, unspecified: Secondary | ICD-10-CM | POA: Diagnosis not present

## 2019-10-12 DIAGNOSIS — D509 Iron deficiency anemia, unspecified: Secondary | ICD-10-CM | POA: Diagnosis not present

## 2019-10-12 DIAGNOSIS — R519 Headache, unspecified: Secondary | ICD-10-CM | POA: Diagnosis not present

## 2019-10-12 DIAGNOSIS — N2581 Secondary hyperparathyroidism of renal origin: Secondary | ICD-10-CM | POA: Diagnosis not present

## 2019-10-12 DIAGNOSIS — Z992 Dependence on renal dialysis: Secondary | ICD-10-CM | POA: Diagnosis not present

## 2019-10-12 DIAGNOSIS — N186 End stage renal disease: Secondary | ICD-10-CM | POA: Diagnosis not present

## 2019-10-12 DIAGNOSIS — Z794 Long term (current) use of insulin: Secondary | ICD-10-CM | POA: Diagnosis not present

## 2019-10-13 DIAGNOSIS — Z992 Dependence on renal dialysis: Secondary | ICD-10-CM | POA: Diagnosis not present

## 2019-10-13 DIAGNOSIS — N2581 Secondary hyperparathyroidism of renal origin: Secondary | ICD-10-CM | POA: Diagnosis not present

## 2019-10-13 DIAGNOSIS — R519 Headache, unspecified: Secondary | ICD-10-CM | POA: Diagnosis not present

## 2019-10-13 DIAGNOSIS — N186 End stage renal disease: Secondary | ICD-10-CM | POA: Diagnosis not present

## 2019-10-13 DIAGNOSIS — Z794 Long term (current) use of insulin: Secondary | ICD-10-CM | POA: Diagnosis not present

## 2019-10-13 DIAGNOSIS — D509 Iron deficiency anemia, unspecified: Secondary | ICD-10-CM | POA: Diagnosis not present

## 2019-10-14 DIAGNOSIS — Z992 Dependence on renal dialysis: Secondary | ICD-10-CM | POA: Diagnosis not present

## 2019-10-14 DIAGNOSIS — Z794 Long term (current) use of insulin: Secondary | ICD-10-CM | POA: Diagnosis not present

## 2019-10-14 DIAGNOSIS — N2581 Secondary hyperparathyroidism of renal origin: Secondary | ICD-10-CM | POA: Diagnosis not present

## 2019-10-14 DIAGNOSIS — R519 Headache, unspecified: Secondary | ICD-10-CM | POA: Diagnosis not present

## 2019-10-14 DIAGNOSIS — D509 Iron deficiency anemia, unspecified: Secondary | ICD-10-CM | POA: Diagnosis not present

## 2019-10-14 DIAGNOSIS — N186 End stage renal disease: Secondary | ICD-10-CM | POA: Diagnosis not present

## 2019-10-17 DIAGNOSIS — N2581 Secondary hyperparathyroidism of renal origin: Secondary | ICD-10-CM | POA: Diagnosis not present

## 2019-10-17 DIAGNOSIS — R519 Headache, unspecified: Secondary | ICD-10-CM | POA: Diagnosis not present

## 2019-10-17 DIAGNOSIS — Z992 Dependence on renal dialysis: Secondary | ICD-10-CM | POA: Diagnosis not present

## 2019-10-17 DIAGNOSIS — Z794 Long term (current) use of insulin: Secondary | ICD-10-CM | POA: Diagnosis not present

## 2019-10-17 DIAGNOSIS — D509 Iron deficiency anemia, unspecified: Secondary | ICD-10-CM | POA: Diagnosis not present

## 2019-10-17 DIAGNOSIS — N186 End stage renal disease: Secondary | ICD-10-CM | POA: Diagnosis not present

## 2019-10-19 DIAGNOSIS — N2581 Secondary hyperparathyroidism of renal origin: Secondary | ICD-10-CM | POA: Diagnosis not present

## 2019-10-19 DIAGNOSIS — R519 Headache, unspecified: Secondary | ICD-10-CM | POA: Diagnosis not present

## 2019-10-19 DIAGNOSIS — Z992 Dependence on renal dialysis: Secondary | ICD-10-CM | POA: Diagnosis not present

## 2019-10-19 DIAGNOSIS — N186 End stage renal disease: Secondary | ICD-10-CM | POA: Diagnosis not present

## 2019-10-19 DIAGNOSIS — D509 Iron deficiency anemia, unspecified: Secondary | ICD-10-CM | POA: Diagnosis not present

## 2019-10-19 DIAGNOSIS — Z794 Long term (current) use of insulin: Secondary | ICD-10-CM | POA: Diagnosis not present

## 2019-10-20 DIAGNOSIS — R519 Headache, unspecified: Secondary | ICD-10-CM | POA: Diagnosis not present

## 2019-10-20 DIAGNOSIS — D509 Iron deficiency anemia, unspecified: Secondary | ICD-10-CM | POA: Diagnosis not present

## 2019-10-20 DIAGNOSIS — Z794 Long term (current) use of insulin: Secondary | ICD-10-CM | POA: Diagnosis not present

## 2019-10-20 DIAGNOSIS — N186 End stage renal disease: Secondary | ICD-10-CM | POA: Diagnosis not present

## 2019-10-20 DIAGNOSIS — N2581 Secondary hyperparathyroidism of renal origin: Secondary | ICD-10-CM | POA: Diagnosis not present

## 2019-10-20 DIAGNOSIS — Z992 Dependence on renal dialysis: Secondary | ICD-10-CM | POA: Diagnosis not present

## 2019-10-21 ENCOUNTER — Ambulatory Visit (INDEPENDENT_AMBULATORY_CARE_PROVIDER_SITE_OTHER): Payer: Medicare Other | Admitting: Vascular Surgery

## 2019-10-21 ENCOUNTER — Encounter: Payer: Self-pay | Admitting: Vascular Surgery

## 2019-10-21 ENCOUNTER — Other Ambulatory Visit: Payer: Self-pay

## 2019-10-21 VITALS — BP 159/70 | HR 63 | Resp 14 | Ht 66.0 in | Wt 166.0 lb

## 2019-10-21 DIAGNOSIS — N2581 Secondary hyperparathyroidism of renal origin: Secondary | ICD-10-CM | POA: Diagnosis not present

## 2019-10-21 DIAGNOSIS — Z992 Dependence on renal dialysis: Secondary | ICD-10-CM | POA: Diagnosis not present

## 2019-10-21 DIAGNOSIS — N186 End stage renal disease: Secondary | ICD-10-CM

## 2019-10-21 DIAGNOSIS — Z794 Long term (current) use of insulin: Secondary | ICD-10-CM | POA: Diagnosis not present

## 2019-10-21 DIAGNOSIS — R519 Headache, unspecified: Secondary | ICD-10-CM | POA: Diagnosis not present

## 2019-10-21 DIAGNOSIS — D509 Iron deficiency anemia, unspecified: Secondary | ICD-10-CM | POA: Diagnosis not present

## 2019-10-21 NOTE — Progress Notes (Signed)
Patient ID: Kimberly [name], female   DOB: July 31, 1955, 64 y.o.   MRN: [id number]  Reason for Consult: Routine Post Op   Referred by Marton Redwood, MD  Subjective:     HPI:  Kimberly [name] is a 64 y.o. female end-stage renal disease currently dialyzing at home via left arm fistula.  She had one area of large pseudoaneurysm that was revised.  She now has a large pseudoaneurysm proximally in the fistula.  She also has cephalic arch stenosis this was recently ballooned.  There is still having difficulty cannulating the cephalad aspect of the fistula and are hoping to have revision of the pseudoaneurysm.  Past Medical History:  Diagnosis Date  . Adenomatous colon polyp 08/2007  . Anemia   . Diabetes mellitus type 2, controlled ([place])    type 2  . Dialysis patient ([place]) 01/2017   M-W-F  . End stage renal disease ([place])     diaylis sunday tuesdays and thurs does at home  . Goiter   . Gout   . Hyperlipidemia   . Hypertension   . Hyperthyroidism   . Kidney disease   . Kidney transplant failure    8 years ago  . Osteopenia   . Pneumonia    2018  . Seasonal allergies   . Vitamin B12 deficiency anemia due to intrinsic factor deficiency    Family History  Problem Relation Age of Onset  . Colon cancer Sister   . Colon cancer Brother   . Heart disease Mother   . Cancer - Other Father        gum to throat   Past Surgical History:  Procedure Laterality Date  . A/V FISTULAGRAM N/A 10/03/2019   Procedure: A/V FISTULAGRAM - Left;  Surgeon: Waynetta Sandy, MD;  Location: [facility];  Service: Cardiovascular;  Laterality: N/A;  . AV FISTULA PLACEMENT Left 2008  . colonscopy  02/2016  . FISTULOGRAM Left 05/18/2019   Procedure: LEFT ARM FISTULOGRAM WITH BALLOON ANGIOPLASTY OF LEFT cephalic arch with 42mm balloon;  Surgeon: Waynetta Sandy, MD;  Location: [facility];  Service: Vascular;  Laterality: Left;  . GANGLION CYST EXCISION Right 1994  . KIDNEY TRANSPLANT  June  2010  . MINOR FULGERATION OF ANAL CONDYLOMA N/A 07/31/2017   Procedure: EXCISION  FULGERATION OF ANAL CONDYLOMA;  Surgeon: Ileana Roup, MD;  [facility];  Service: General;  Laterality: N/A;  . PERIPHERAL VASCULAR BALLOON ANGIOPLASTY Left 10/03/2019   Procedure: PERIPHERAL VASCULAR BALLOON ANGIOPLASTY;  Surgeon: Waynetta Sandy, MD;  Location: [facility];  Service: Cardiovascular;  Laterality: Left;  AVF  . RECTAL EXAM UNDER ANESTHESIA N/A 07/03/2016   Procedure: RECTAL EXAM UNDER ANESTHESIA;  Surgeon: Greer Pickerel, MD;  [facility];  Service: General;  Laterality: N/A;  . REVISON OF ARTERIOVENOUS FISTULA Left 05/18/2019   Procedure: revision Of  left upper arm Arteriovenous Fistula with excision of aneurysm;  Surgeon: Waynetta Sandy, MD;  Location: [facility];  Service: Vascular;  Laterality: Left;  . SHOULDER ARTHROSCOPY W/ ROTATOR CUFF REPAIR Right yrs ago  . UPPER GI ENDOSCOPY  sept   2017  . WART FULGURATION N/A 07/03/2016   Procedure: EXCISION FULGURATION ANAL WART EXCISIONAL BIOSY OF EXTERNAL HEMORROID;  Surgeon: Greer Pickerel, MD;  [facility];  Service: General;  Laterality: N/A;    Short Social History:  Social History   Tobacco Use  . Smoking status: Former Smoker    Packs/day: 1.00  Years: 20.00    Pack years: 20.00    Types: Cigarettes    Quit date: 07/09/1997    Years since quitting: 22.2  . Smokeless tobacco: Never Used  Substance Use Topics  . Alcohol use: No    Allergies  Allergen Reactions  . Sucroferric Oxyhydroxide     Other reaction(s): Flushing (Red Skin)  . Other Hives    tape  . Phoslo  [Calcium Acetate] Rash    Current Outpatient Medications  Medication Sig Dispense Refill  . acetaminophen (TYLENOL) 500 MG tablet Take 500-1,000 mg by mouth every 8 (eight) hours as needed for mild pain or headache (depends on pain if takes 1-2 tablets).    Marland Kitchen amLODipine (NORVASC) 10 MG tablet Take 10 mg by mouth every evening.      Marland Kitchen amoxicillin (AMOXIL) 500 MG capsule Take 2,000 mg by mouth See admin instructions. Take 4 capsules (2000 mg) by mouth 1 hour prior to dental work    . B-D ULTRAFINE III SHORT PEN 31G X 8 MM MISC Use twice daily as directed  0  . calcitRIOL (ROCALTROL) 0.5 MCG capsule Take 1.5 mcg by mouth daily.     . cinacalcet (SENSIPAR) 30 MG tablet Take 30 mg by mouth daily.    Marland Kitchen ezetimibe-simvastatin (VYTORIN) 10-20 MG per tablet Take 1 tablet by mouth at bedtime.    . ferric citrate (AURYXIA) 1 GM 210 MG(Fe) tablet Take 210 mg by mouth 3 (three) times daily with meals.     Marland Kitchen HYDROcodone-acetaminophen (NORCO) 5-325 MG tablet Take 1 tablet by mouth every 6 (six) hours as needed for moderate pain. (Patient not taking: Reported on 09/28/2019) 20 tablet 0  . labetalol (NORMODYNE) 200 MG tablet Take 1 tablet (200 mg total) by mouth 2 (two) times daily. 60 tablet 0  . omeprazole (PRILOSEC) 40 MG capsule Take 40 mg by mouth daily.  0  . predniSONE (DELTASONE) 5 MG tablet Take 5 mg by mouth daily.    Nelva Nay MAX SOLOSTAR 300 UNIT/ML SOPN Inject 8 Units into the skin every evening.     No current facility-administered medications for this visit.    Review of Systems  Constitutional:  Constitutional negative. HENT: HENT negative.  Eyes: Eyes negative.  Respiratory: Respiratory negative.  Cardiovascular: Cardiovascular negative.  GI: Gastrointestinal negative.  Musculoskeletal: Musculoskeletal negative.  Skin: Skin negative.  Neurological: Neurological negative. Hematologic: Hematologic/lymphatic negative.  Psychiatric: Psychiatric negative.        Objective:  Objective   There were no vitals filed for this visit. There is no height or weight on file to calculate BMI.  Physical Exam HENT:     Head: Normocephalic.     Nose:     Comments: Mask in place Eyes:     Pupils: Pupils are equal, round, and reactive to light.  Cardiovascular:     Rate and Rhythm: Normal rate.     Pulses:           Radial pulses are 2+ on the right side and 2+ on the left side.  Pulmonary:     Effort: Pulmonary effort is normal.  Abdominal:     General: Abdomen is flat.     Palpations: Abdomen is soft.  Musculoskeletal:        General: Normal range of motion.     Comments: Large pseudoaneurysm near the antecubitum and for approximately 10 cm well-healed incision above this and there is a palpable thrill throughout the fistula  Skin:  General: Skin is warm.     Capillary Refill: Capillary refill takes less than 2 seconds.  Neurological:     General: No focal deficit present.     Mental Status: She is alert.  Psychiatric:        Mood and Affect: Mood normal.        Behavior: Behavior normal.        Thought Content: Thought content normal.        Judgment: Judgment normal.     Data: I reviewed the imaging with the patient and her husband today including her recent fistulogram and we also used ultrasound at the bedside to identify the fistula and its course in the upper arm       Assessment/Plan:     64 year old female status post cephalic arch balloon angioplasty still having difficulty with cannulating the most cephalad aspect of the fistula.  We have discussed revision of the proximal aspect however if they cannot cannulate this this would require catheter.  Patient does not want catheter.  She is good to go home and attempt to cannulate fistula over the next week and I will have a phone visit with her next week to discuss our options moving forward which would include no surgical intervention versus revision of the pseudoaneurysm and placement of catheter versus revision of the pseudoaneurysm if they can cannulate the most cephalad aspect.     Waynetta Sandy MD [facility]

## 2019-10-22 DIAGNOSIS — N186 End stage renal disease: Secondary | ICD-10-CM | POA: Diagnosis not present

## 2019-10-22 DIAGNOSIS — Z992 Dependence on renal dialysis: Secondary | ICD-10-CM | POA: Diagnosis not present

## 2019-10-22 DIAGNOSIS — T8610 Unspecified complication of kidney transplant: Secondary | ICD-10-CM | POA: Diagnosis not present

## 2019-10-27 DIAGNOSIS — M109 Gout, unspecified: Secondary | ICD-10-CM | POA: Diagnosis not present

## 2019-10-28 ENCOUNTER — Ambulatory Visit (INDEPENDENT_AMBULATORY_CARE_PROVIDER_SITE_OTHER): Payer: Self-pay | Admitting: Vascular Surgery

## 2019-10-28 ENCOUNTER — Other Ambulatory Visit: Payer: Self-pay

## 2019-10-28 ENCOUNTER — Encounter: Payer: Self-pay | Admitting: Vascular Surgery

## 2019-10-28 DIAGNOSIS — N186 End stage renal disease: Secondary | ICD-10-CM

## 2019-10-28 NOTE — Progress Notes (Signed)
      64 year old female with left arm cephalic vein fistula.  I called today to discuss if they were able to cannulate unfortunately they are unable to and currently cannulating the pseudoaneurysm.  Patient's husband is requesting to bring attack with him to the next office visit which I have agreed to.  This will be a very quick office visit where I can show the tech where the fistula is by ultrasound.  He is going to call back next week to schedule this on a Friday when I am in the office.  It would be okay to overbook.  If in fact they are able to cannulate it with the help of the tech then we will plan to revise the fistula.  Our other options will be no operative intervention or place a catheter and revise the fistula.   Signed, Servando Snare [facility] [place] Office: [phone]  10/28/2019, 4:00 PM

## 2019-11-18 ENCOUNTER — Other Ambulatory Visit: Payer: Self-pay

## 2019-11-18 ENCOUNTER — Ambulatory Visit (INDEPENDENT_AMBULATORY_CARE_PROVIDER_SITE_OTHER): Payer: Medicare Other | Admitting: Vascular Surgery

## 2019-11-18 ENCOUNTER — Encounter: Payer: Self-pay | Admitting: Vascular Surgery

## 2019-11-18 VITALS — BP 170/67 | HR 66 | Temp 97.5°F | Resp 20 | Ht 66.0 in | Wt 166.0 lb

## 2019-11-18 DIAGNOSIS — N186 End stage renal disease: Secondary | ICD-10-CM

## 2019-11-18 NOTE — Progress Notes (Signed)
Patient ID: Kimberly [name], female   DOB: 02-Aug-1955, 64 y.o.   MRN: [id number]  Reason for Consult: No chief complaint on file.   Referred by Marton Redwood, MD  Subjective:     HPI:  Kimberly [name] is a 64 y.o. female has previously had revision of the left arm AV fistula cephalic vein with excision of pseudoaneurysm.  She has another large area of pseudoaneurysm that has difficulty with cannulation and they also have difficulty cannulating the previously revised area.  She now presents with her tach to see if we can evaluate where to cannulate the fistula prior to performing further revision.  Past Medical History:  Diagnosis Date  . Adenomatous colon polyp 08/2007  . Anemia   . Diabetes mellitus type 2, controlled ([place])    type 2  . Dialysis patient ([place]) 01/2017   M-W-F  . End stage renal disease ([place])     diaylis sunday tuesdays and thurs does at home  . Goiter   . Gout   . Hyperlipidemia   . Hypertension   . Hyperthyroidism   . Kidney disease   . Kidney transplant failure    8 years ago  . Osteopenia   . Pneumonia    2018  . Seasonal allergies   . Vitamin B12 deficiency anemia due to intrinsic factor deficiency    Family History  Problem Relation Age of Onset  . Colon cancer Sister   . Colon cancer Brother   . Heart disease Mother   . Cancer - Other Father        gum to throat   Past Surgical History:  Procedure Laterality Date  . A/V FISTULAGRAM N/A 10/03/2019   Procedure: A/V FISTULAGRAM - Left;  Surgeon: Waynetta Sandy, MD;  Location: [facility];  Service: Cardiovascular;  Laterality: N/A;  . AV FISTULA PLACEMENT Left 2008  . colonscopy  02/2016  . FISTULOGRAM Left 05/18/2019   Procedure: LEFT ARM FISTULOGRAM WITH BALLOON ANGIOPLASTY OF LEFT cephalic arch with 73mm balloon;  Surgeon: Waynetta Sandy, MD;  Location: [facility];  Service: Vascular;  Laterality: Left;  . GANGLION CYST EXCISION Right 1994  . KIDNEY TRANSPLANT  June  2010  . MINOR FULGERATION OF ANAL CONDYLOMA N/A 07/31/2017   Procedure: EXCISION  FULGERATION OF ANAL CONDYLOMA;  Surgeon: Ileana Roup, MD;  [facility];  Service: General;  Laterality: N/A;  . PERIPHERAL VASCULAR BALLOON ANGIOPLASTY Left 10/03/2019   Procedure: PERIPHERAL VASCULAR BALLOON ANGIOPLASTY;  Surgeon: Waynetta Sandy, MD;  Location: [facility];  Service: Cardiovascular;  Laterality: Left;  AVF  . RECTAL EXAM UNDER ANESTHESIA N/A 07/03/2016   Procedure: RECTAL EXAM UNDER ANESTHESIA;  Surgeon: Greer Pickerel, MD;  [facility];  Service: General;  Laterality: N/A;  . REVISON OF ARTERIOVENOUS FISTULA Left 05/18/2019   Procedure: revision Of  left upper arm Arteriovenous Fistula with excision of aneurysm;  Surgeon: Waynetta Sandy, MD;  Location: [facility];  Service: Vascular;  Laterality: Left;  . SHOULDER ARTHROSCOPY W/ ROTATOR CUFF REPAIR Right yrs ago  . UPPER GI ENDOSCOPY  sept   2017  . WART FULGURATION N/A 07/03/2016   Procedure: EXCISION FULGURATION ANAL WART EXCISIONAL BIOSY OF EXTERNAL HEMORROID;  Surgeon: Greer Pickerel, MD;  [facility];  Service: General;  Laterality: N/A;    Short Social History:  Social History   Tobacco Use  . Smoking status: Former Smoker    Packs/day: 1.00  Years: 20.00    Pack years: 20.00    Types: Cigarettes    Quit date: 07/09/1997    Years since quitting: 22.3  . Smokeless tobacco: Never Used  Substance Use Topics  . Alcohol use: No    Allergies  Allergen Reactions  . Sucroferric Oxyhydroxide     Other reaction(s): Flushing (Red Skin)  . Other Hives    tape  . Phoslo  [Calcium Acetate] Rash    Current Outpatient Medications  Medication Sig Dispense Refill  . acetaminophen (TYLENOL) 500 MG tablet Take 500-1,000 mg by mouth every 8 (eight) hours as needed for mild pain or headache (depends on pain if takes 1-2 tablets).    Marland Kitchen amLODipine (NORVASC) 10 MG tablet Take 10 mg by mouth every evening.      Marland Kitchen amoxicillin (AMOXIL) 500 MG capsule Take 2,000 mg by mouth See admin instructions. Take 4 capsules (2000 mg) by mouth 1 hour prior to dental work    . B-D ULTRAFINE III SHORT PEN 31G X 8 MM MISC Use twice daily as directed  0  . calcitRIOL (ROCALTROL) 0.5 MCG capsule Take 1.5 mcg by mouth daily.     . cinacalcet (SENSIPAR) 30 MG tablet Take 30 mg by mouth daily.    Marland Kitchen ezetimibe-simvastatin (VYTORIN) 10-20 MG per tablet Take 1 tablet by mouth at bedtime.    . ferric citrate (AURYXIA) 1 GM 210 MG(Fe) tablet Take 210 mg by mouth 3 (three) times daily with meals.     Marland Kitchen HYDROcodone-acetaminophen (NORCO) 5-325 MG tablet Take 1 tablet by mouth every 6 (six) hours as needed for moderate pain. 20 tablet 0  . labetalol (NORMODYNE) 200 MG tablet Take 1 tablet (200 mg total) by mouth 2 (two) times daily. 60 tablet 0  . omeprazole (PRILOSEC) 40 MG capsule Take 40 mg by mouth daily.  0  . predniSONE (DELTASONE) 5 MG tablet Take 5 mg by mouth daily.    Nelva Nay MAX SOLOSTAR 300 UNIT/ML SOPN Inject 8 Units into the skin every evening.     No current facility-administered medications for this visit.    Review of Systems  Constitutional:  Constitutional negative. HENT: HENT negative.  Eyes: Eyes negative.  Respiratory: Respiratory negative.  Cardiovascular: Cardiovascular negative.  GI: Gastrointestinal negative.  Musculoskeletal: Musculoskeletal negative.  Skin: Skin negative.  Neurological: Neurological negative. Hematologic: Hematologic/lymphatic negative.  Psychiatric: Psychiatric negative.        Objective:  Objective   Vitals:   11/18/19 1528  BP: (!) 170/67  Pulse: 66  Resp: 20  Temp: (!) 97.5 F (36.4 C)  SpO2: 95%    Physical Exam HENT:     Head: Normocephalic.     Nose:     Comments: Wearing a mask Eyes:     Pupils: Pupils are equal, round, and reactive to light.  Cardiovascular:     Rate and Rhythm: Normal rate.  Pulmonary:     Effort: Pulmonary effort is normal.    Abdominal:     General: Abdomen is flat.  Musculoskeletal:     Cervical back: Normal range of motion and neck supple.     Comments: Large pseudoaneurysm near the antecubitum and for approximately 10 cm well-healed incision above this and there is a palpable thrill throughout the fistula   Skin:    General: Skin is warm.     Capillary Refill: Capillary refill takes less than 2 seconds.  Neurological:     Mental Status: She is alert.  Psychiatric:  Mood and Affect: Mood normal.        Behavior: Behavior normal.        Thought Content: Thought content normal.        Judgment: Judgment normal.    Evaluated the fistula with ultrasound at bedside     Assessment/Plan:     64 year old female status post revision of left arm AV fistula and balloon angioplasty of cephalic arch.  She presents today with her husband and her tech to evaluate for cannulation in 2 areas so we can revise the area near the antecubitum.  In reality they could stick the large pseudoaneurysm near the antecubitum to limp this along somewhat longer and eventually we could replace the entire thing with Artegraft.  This would require catheter placement as would pseudoaneurysm repair unless they are able to cannulate into areas higher.  They are going to attempt to do this call us back when they are ready for revision.    Waynetta Sandy MD [facility]

## 2019-11-23 DIAGNOSIS — I953 Hypotension of hemodialysis: Secondary | ICD-10-CM | POA: Diagnosis not present

## 2019-11-23 DIAGNOSIS — Z4931 Encounter for adequacy testing for hemodialysis: Secondary | ICD-10-CM | POA: Diagnosis not present

## 2019-11-23 DIAGNOSIS — Z794 Long term (current) use of insulin: Secondary | ICD-10-CM | POA: Diagnosis not present

## 2019-11-23 DIAGNOSIS — N186 End stage renal disease: Secondary | ICD-10-CM | POA: Diagnosis not present

## 2019-11-23 DIAGNOSIS — I120 Hypertensive chronic kidney disease with stage 5 chronic kidney disease or end stage renal disease: Secondary | ICD-10-CM | POA: Diagnosis not present

## 2019-11-23 DIAGNOSIS — E1122 Type 2 diabetes mellitus with diabetic chronic kidney disease: Secondary | ICD-10-CM | POA: Diagnosis not present

## 2019-11-23 DIAGNOSIS — Z992 Dependence on renal dialysis: Secondary | ICD-10-CM | POA: Diagnosis not present

## 2019-11-23 DIAGNOSIS — D631 Anemia in chronic kidney disease: Secondary | ICD-10-CM | POA: Diagnosis not present

## 2019-11-23 DIAGNOSIS — R252 Cramp and spasm: Secondary | ICD-10-CM | POA: Diagnosis not present

## 2019-11-23 DIAGNOSIS — R519 Headache, unspecified: Secondary | ICD-10-CM | POA: Diagnosis not present

## 2019-11-23 DIAGNOSIS — D509 Iron deficiency anemia, unspecified: Secondary | ICD-10-CM | POA: Diagnosis not present

## 2019-11-23 DIAGNOSIS — N2581 Secondary hyperparathyroidism of renal origin: Secondary | ICD-10-CM | POA: Diagnosis not present

## 2019-11-24 DIAGNOSIS — D631 Anemia in chronic kidney disease: Secondary | ICD-10-CM | POA: Diagnosis not present

## 2019-11-24 DIAGNOSIS — D509 Iron deficiency anemia, unspecified: Secondary | ICD-10-CM | POA: Diagnosis not present

## 2019-11-24 DIAGNOSIS — Z4931 Encounter for adequacy testing for hemodialysis: Secondary | ICD-10-CM | POA: Diagnosis not present

## 2019-11-24 DIAGNOSIS — N186 End stage renal disease: Secondary | ICD-10-CM | POA: Diagnosis not present

## 2019-11-24 DIAGNOSIS — N2581 Secondary hyperparathyroidism of renal origin: Secondary | ICD-10-CM | POA: Diagnosis not present

## 2019-11-24 DIAGNOSIS — Z992 Dependence on renal dialysis: Secondary | ICD-10-CM | POA: Diagnosis not present

## 2019-11-25 DIAGNOSIS — Z992 Dependence on renal dialysis: Secondary | ICD-10-CM | POA: Diagnosis not present

## 2019-11-25 DIAGNOSIS — N186 End stage renal disease: Secondary | ICD-10-CM | POA: Diagnosis not present

## 2019-11-25 DIAGNOSIS — D509 Iron deficiency anemia, unspecified: Secondary | ICD-10-CM | POA: Diagnosis not present

## 2019-11-25 DIAGNOSIS — N2581 Secondary hyperparathyroidism of renal origin: Secondary | ICD-10-CM | POA: Diagnosis not present

## 2019-11-25 DIAGNOSIS — D631 Anemia in chronic kidney disease: Secondary | ICD-10-CM | POA: Diagnosis not present

## 2019-11-25 DIAGNOSIS — Z4931 Encounter for adequacy testing for hemodialysis: Secondary | ICD-10-CM | POA: Diagnosis not present

## 2019-11-28 DIAGNOSIS — Z4931 Encounter for adequacy testing for hemodialysis: Secondary | ICD-10-CM | POA: Diagnosis not present

## 2019-11-28 DIAGNOSIS — D509 Iron deficiency anemia, unspecified: Secondary | ICD-10-CM | POA: Diagnosis not present

## 2019-11-28 DIAGNOSIS — N186 End stage renal disease: Secondary | ICD-10-CM | POA: Diagnosis not present

## 2019-11-28 DIAGNOSIS — D631 Anemia in chronic kidney disease: Secondary | ICD-10-CM | POA: Diagnosis not present

## 2019-11-28 DIAGNOSIS — N2581 Secondary hyperparathyroidism of renal origin: Secondary | ICD-10-CM | POA: Diagnosis not present

## 2019-11-28 DIAGNOSIS — Z992 Dependence on renal dialysis: Secondary | ICD-10-CM | POA: Diagnosis not present

## 2019-11-30 DIAGNOSIS — N186 End stage renal disease: Secondary | ICD-10-CM | POA: Diagnosis not present

## 2019-11-30 DIAGNOSIS — N2581 Secondary hyperparathyroidism of renal origin: Secondary | ICD-10-CM | POA: Diagnosis not present

## 2019-11-30 DIAGNOSIS — D509 Iron deficiency anemia, unspecified: Secondary | ICD-10-CM | POA: Diagnosis not present

## 2019-11-30 DIAGNOSIS — Z4931 Encounter for adequacy testing for hemodialysis: Secondary | ICD-10-CM | POA: Diagnosis not present

## 2019-11-30 DIAGNOSIS — D631 Anemia in chronic kidney disease: Secondary | ICD-10-CM | POA: Diagnosis not present

## 2019-11-30 DIAGNOSIS — Z992 Dependence on renal dialysis: Secondary | ICD-10-CM | POA: Diagnosis not present

## 2019-12-01 DIAGNOSIS — Z4931 Encounter for adequacy testing for hemodialysis: Secondary | ICD-10-CM | POA: Diagnosis not present

## 2019-12-01 DIAGNOSIS — N186 End stage renal disease: Secondary | ICD-10-CM | POA: Diagnosis not present

## 2019-12-01 DIAGNOSIS — D631 Anemia in chronic kidney disease: Secondary | ICD-10-CM | POA: Diagnosis not present

## 2019-12-01 DIAGNOSIS — D509 Iron deficiency anemia, unspecified: Secondary | ICD-10-CM | POA: Diagnosis not present

## 2019-12-01 DIAGNOSIS — N2581 Secondary hyperparathyroidism of renal origin: Secondary | ICD-10-CM | POA: Diagnosis not present

## 2019-12-01 DIAGNOSIS — Z992 Dependence on renal dialysis: Secondary | ICD-10-CM | POA: Diagnosis not present

## 2019-12-02 DIAGNOSIS — D631 Anemia in chronic kidney disease: Secondary | ICD-10-CM | POA: Diagnosis not present

## 2019-12-02 DIAGNOSIS — Z4931 Encounter for adequacy testing for hemodialysis: Secondary | ICD-10-CM | POA: Diagnosis not present

## 2019-12-02 DIAGNOSIS — N2581 Secondary hyperparathyroidism of renal origin: Secondary | ICD-10-CM | POA: Diagnosis not present

## 2019-12-02 DIAGNOSIS — Z992 Dependence on renal dialysis: Secondary | ICD-10-CM | POA: Diagnosis not present

## 2019-12-02 DIAGNOSIS — D509 Iron deficiency anemia, unspecified: Secondary | ICD-10-CM | POA: Diagnosis not present

## 2019-12-02 DIAGNOSIS — N186 End stage renal disease: Secondary | ICD-10-CM | POA: Diagnosis not present

## 2019-12-04 DIAGNOSIS — D509 Iron deficiency anemia, unspecified: Secondary | ICD-10-CM | POA: Diagnosis not present

## 2019-12-04 DIAGNOSIS — Z992 Dependence on renal dialysis: Secondary | ICD-10-CM | POA: Diagnosis not present

## 2019-12-04 DIAGNOSIS — N2581 Secondary hyperparathyroidism of renal origin: Secondary | ICD-10-CM | POA: Diagnosis not present

## 2019-12-04 DIAGNOSIS — N186 End stage renal disease: Secondary | ICD-10-CM | POA: Diagnosis not present

## 2019-12-04 DIAGNOSIS — Z4931 Encounter for adequacy testing for hemodialysis: Secondary | ICD-10-CM | POA: Diagnosis not present

## 2019-12-04 DIAGNOSIS — D631 Anemia in chronic kidney disease: Secondary | ICD-10-CM | POA: Diagnosis not present

## 2019-12-05 DIAGNOSIS — N186 End stage renal disease: Secondary | ICD-10-CM | POA: Diagnosis not present

## 2019-12-05 DIAGNOSIS — Z992 Dependence on renal dialysis: Secondary | ICD-10-CM | POA: Diagnosis not present

## 2019-12-05 DIAGNOSIS — Z4931 Encounter for adequacy testing for hemodialysis: Secondary | ICD-10-CM | POA: Diagnosis not present

## 2019-12-05 DIAGNOSIS — D509 Iron deficiency anemia, unspecified: Secondary | ICD-10-CM | POA: Diagnosis not present

## 2019-12-05 DIAGNOSIS — D631 Anemia in chronic kidney disease: Secondary | ICD-10-CM | POA: Diagnosis not present

## 2019-12-05 DIAGNOSIS — N2581 Secondary hyperparathyroidism of renal origin: Secondary | ICD-10-CM | POA: Diagnosis not present

## 2019-12-06 DIAGNOSIS — D509 Iron deficiency anemia, unspecified: Secondary | ICD-10-CM | POA: Diagnosis not present

## 2019-12-06 DIAGNOSIS — D631 Anemia in chronic kidney disease: Secondary | ICD-10-CM | POA: Diagnosis not present

## 2019-12-06 DIAGNOSIS — N2581 Secondary hyperparathyroidism of renal origin: Secondary | ICD-10-CM | POA: Diagnosis not present

## 2019-12-06 DIAGNOSIS — Z4931 Encounter for adequacy testing for hemodialysis: Secondary | ICD-10-CM | POA: Diagnosis not present

## 2019-12-06 DIAGNOSIS — N186 End stage renal disease: Secondary | ICD-10-CM | POA: Diagnosis not present

## 2019-12-06 DIAGNOSIS — Z992 Dependence on renal dialysis: Secondary | ICD-10-CM | POA: Diagnosis not present

## 2019-12-07 DIAGNOSIS — D631 Anemia in chronic kidney disease: Secondary | ICD-10-CM | POA: Diagnosis not present

## 2019-12-07 DIAGNOSIS — Z992 Dependence on renal dialysis: Secondary | ICD-10-CM | POA: Diagnosis not present

## 2019-12-07 DIAGNOSIS — Z4931 Encounter for adequacy testing for hemodialysis: Secondary | ICD-10-CM | POA: Diagnosis not present

## 2019-12-07 DIAGNOSIS — N2581 Secondary hyperparathyroidism of renal origin: Secondary | ICD-10-CM | POA: Diagnosis not present

## 2019-12-07 DIAGNOSIS — N186 End stage renal disease: Secondary | ICD-10-CM | POA: Diagnosis not present

## 2019-12-07 DIAGNOSIS — D509 Iron deficiency anemia, unspecified: Secondary | ICD-10-CM | POA: Diagnosis not present

## 2019-12-08 DIAGNOSIS — Z992 Dependence on renal dialysis: Secondary | ICD-10-CM | POA: Diagnosis not present

## 2019-12-08 DIAGNOSIS — Z4931 Encounter for adequacy testing for hemodialysis: Secondary | ICD-10-CM | POA: Diagnosis not present

## 2019-12-08 DIAGNOSIS — D631 Anemia in chronic kidney disease: Secondary | ICD-10-CM | POA: Diagnosis not present

## 2019-12-08 DIAGNOSIS — N2581 Secondary hyperparathyroidism of renal origin: Secondary | ICD-10-CM | POA: Diagnosis not present

## 2019-12-08 DIAGNOSIS — N186 End stage renal disease: Secondary | ICD-10-CM | POA: Diagnosis not present

## 2019-12-08 DIAGNOSIS — D509 Iron deficiency anemia, unspecified: Secondary | ICD-10-CM | POA: Diagnosis not present

## 2019-12-09 DIAGNOSIS — N186 End stage renal disease: Secondary | ICD-10-CM | POA: Diagnosis not present

## 2019-12-09 DIAGNOSIS — N2581 Secondary hyperparathyroidism of renal origin: Secondary | ICD-10-CM | POA: Diagnosis not present

## 2019-12-09 DIAGNOSIS — D631 Anemia in chronic kidney disease: Secondary | ICD-10-CM | POA: Diagnosis not present

## 2019-12-09 DIAGNOSIS — Z992 Dependence on renal dialysis: Secondary | ICD-10-CM | POA: Diagnosis not present

## 2019-12-09 DIAGNOSIS — Z4931 Encounter for adequacy testing for hemodialysis: Secondary | ICD-10-CM | POA: Diagnosis not present

## 2019-12-09 DIAGNOSIS — D509 Iron deficiency anemia, unspecified: Secondary | ICD-10-CM | POA: Diagnosis not present

## 2019-12-12 DIAGNOSIS — N186 End stage renal disease: Secondary | ICD-10-CM | POA: Diagnosis not present

## 2019-12-12 DIAGNOSIS — Z4931 Encounter for adequacy testing for hemodialysis: Secondary | ICD-10-CM | POA: Diagnosis not present

## 2019-12-12 DIAGNOSIS — D631 Anemia in chronic kidney disease: Secondary | ICD-10-CM | POA: Diagnosis not present

## 2019-12-12 DIAGNOSIS — D509 Iron deficiency anemia, unspecified: Secondary | ICD-10-CM | POA: Diagnosis not present

## 2019-12-12 DIAGNOSIS — Z992 Dependence on renal dialysis: Secondary | ICD-10-CM | POA: Diagnosis not present

## 2019-12-12 DIAGNOSIS — N2581 Secondary hyperparathyroidism of renal origin: Secondary | ICD-10-CM | POA: Diagnosis not present

## 2019-12-14 DIAGNOSIS — Z4931 Encounter for adequacy testing for hemodialysis: Secondary | ICD-10-CM | POA: Diagnosis not present

## 2019-12-14 DIAGNOSIS — N2581 Secondary hyperparathyroidism of renal origin: Secondary | ICD-10-CM | POA: Diagnosis not present

## 2019-12-14 DIAGNOSIS — N186 End stage renal disease: Secondary | ICD-10-CM | POA: Diagnosis not present

## 2019-12-14 DIAGNOSIS — D509 Iron deficiency anemia, unspecified: Secondary | ICD-10-CM | POA: Diagnosis not present

## 2019-12-14 DIAGNOSIS — Z992 Dependence on renal dialysis: Secondary | ICD-10-CM | POA: Diagnosis not present

## 2019-12-14 DIAGNOSIS — D631 Anemia in chronic kidney disease: Secondary | ICD-10-CM | POA: Diagnosis not present

## 2019-12-15 DIAGNOSIS — Z992 Dependence on renal dialysis: Secondary | ICD-10-CM | POA: Diagnosis not present

## 2019-12-15 DIAGNOSIS — N2581 Secondary hyperparathyroidism of renal origin: Secondary | ICD-10-CM | POA: Diagnosis not present

## 2019-12-15 DIAGNOSIS — D631 Anemia in chronic kidney disease: Secondary | ICD-10-CM | POA: Diagnosis not present

## 2019-12-15 DIAGNOSIS — Z4931 Encounter for adequacy testing for hemodialysis: Secondary | ICD-10-CM | POA: Diagnosis not present

## 2019-12-15 DIAGNOSIS — N186 End stage renal disease: Secondary | ICD-10-CM | POA: Diagnosis not present

## 2019-12-15 DIAGNOSIS — D509 Iron deficiency anemia, unspecified: Secondary | ICD-10-CM | POA: Diagnosis not present

## 2019-12-16 DIAGNOSIS — N186 End stage renal disease: Secondary | ICD-10-CM | POA: Diagnosis not present

## 2019-12-16 DIAGNOSIS — D631 Anemia in chronic kidney disease: Secondary | ICD-10-CM | POA: Diagnosis not present

## 2019-12-16 DIAGNOSIS — Z4931 Encounter for adequacy testing for hemodialysis: Secondary | ICD-10-CM | POA: Diagnosis not present

## 2019-12-16 DIAGNOSIS — Z992 Dependence on renal dialysis: Secondary | ICD-10-CM | POA: Diagnosis not present

## 2019-12-16 DIAGNOSIS — D509 Iron deficiency anemia, unspecified: Secondary | ICD-10-CM | POA: Diagnosis not present

## 2019-12-16 DIAGNOSIS — N2581 Secondary hyperparathyroidism of renal origin: Secondary | ICD-10-CM | POA: Diagnosis not present

## 2019-12-19 DIAGNOSIS — Z4931 Encounter for adequacy testing for hemodialysis: Secondary | ICD-10-CM | POA: Diagnosis not present

## 2019-12-19 DIAGNOSIS — D509 Iron deficiency anemia, unspecified: Secondary | ICD-10-CM | POA: Diagnosis not present

## 2019-12-19 DIAGNOSIS — N186 End stage renal disease: Secondary | ICD-10-CM | POA: Diagnosis not present

## 2019-12-19 DIAGNOSIS — Z992 Dependence on renal dialysis: Secondary | ICD-10-CM | POA: Diagnosis not present

## 2019-12-19 DIAGNOSIS — N2581 Secondary hyperparathyroidism of renal origin: Secondary | ICD-10-CM | POA: Diagnosis not present

## 2019-12-19 DIAGNOSIS — D631 Anemia in chronic kidney disease: Secondary | ICD-10-CM | POA: Diagnosis not present

## 2019-12-21 DIAGNOSIS — N2581 Secondary hyperparathyroidism of renal origin: Secondary | ICD-10-CM | POA: Diagnosis not present

## 2019-12-21 DIAGNOSIS — D631 Anemia in chronic kidney disease: Secondary | ICD-10-CM | POA: Diagnosis not present

## 2019-12-21 DIAGNOSIS — Z992 Dependence on renal dialysis: Secondary | ICD-10-CM | POA: Diagnosis not present

## 2019-12-21 DIAGNOSIS — D509 Iron deficiency anemia, unspecified: Secondary | ICD-10-CM | POA: Diagnosis not present

## 2019-12-21 DIAGNOSIS — Z4931 Encounter for adequacy testing for hemodialysis: Secondary | ICD-10-CM | POA: Diagnosis not present

## 2019-12-21 DIAGNOSIS — N186 End stage renal disease: Secondary | ICD-10-CM | POA: Diagnosis not present

## 2019-12-22 DIAGNOSIS — T8610 Unspecified complication of kidney transplant: Secondary | ICD-10-CM | POA: Diagnosis not present

## 2019-12-22 DIAGNOSIS — Z794 Long term (current) use of insulin: Secondary | ICD-10-CM | POA: Diagnosis not present

## 2019-12-22 DIAGNOSIS — N2581 Secondary hyperparathyroidism of renal origin: Secondary | ICD-10-CM | POA: Diagnosis not present

## 2019-12-22 DIAGNOSIS — Z992 Dependence on renal dialysis: Secondary | ICD-10-CM | POA: Diagnosis not present

## 2019-12-22 DIAGNOSIS — N186 End stage renal disease: Secondary | ICD-10-CM | POA: Diagnosis not present

## 2019-12-22 DIAGNOSIS — R252 Cramp and spasm: Secondary | ICD-10-CM | POA: Diagnosis not present

## 2019-12-22 DIAGNOSIS — E1122 Type 2 diabetes mellitus with diabetic chronic kidney disease: Secondary | ICD-10-CM | POA: Diagnosis not present

## 2019-12-22 DIAGNOSIS — D631 Anemia in chronic kidney disease: Secondary | ICD-10-CM | POA: Diagnosis not present

## 2019-12-22 DIAGNOSIS — I120 Hypertensive chronic kidney disease with stage 5 chronic kidney disease or end stage renal disease: Secondary | ICD-10-CM | POA: Diagnosis not present

## 2019-12-22 DIAGNOSIS — Z4931 Encounter for adequacy testing for hemodialysis: Secondary | ICD-10-CM | POA: Diagnosis not present

## 2019-12-22 DIAGNOSIS — I953 Hypotension of hemodialysis: Secondary | ICD-10-CM | POA: Diagnosis not present

## 2019-12-22 DIAGNOSIS — R519 Headache, unspecified: Secondary | ICD-10-CM | POA: Diagnosis not present

## 2019-12-22 DIAGNOSIS — D509 Iron deficiency anemia, unspecified: Secondary | ICD-10-CM | POA: Diagnosis not present

## 2019-12-23 DIAGNOSIS — Z4931 Encounter for adequacy testing for hemodialysis: Secondary | ICD-10-CM | POA: Diagnosis not present

## 2019-12-23 DIAGNOSIS — Z992 Dependence on renal dialysis: Secondary | ICD-10-CM | POA: Diagnosis not present

## 2019-12-23 DIAGNOSIS — D631 Anemia in chronic kidney disease: Secondary | ICD-10-CM | POA: Diagnosis not present

## 2019-12-23 DIAGNOSIS — N2581 Secondary hyperparathyroidism of renal origin: Secondary | ICD-10-CM | POA: Diagnosis not present

## 2019-12-23 DIAGNOSIS — N186 End stage renal disease: Secondary | ICD-10-CM | POA: Diagnosis not present

## 2019-12-23 DIAGNOSIS — D509 Iron deficiency anemia, unspecified: Secondary | ICD-10-CM | POA: Diagnosis not present

## 2019-12-26 DIAGNOSIS — N2581 Secondary hyperparathyroidism of renal origin: Secondary | ICD-10-CM | POA: Diagnosis not present

## 2019-12-26 DIAGNOSIS — D631 Anemia in chronic kidney disease: Secondary | ICD-10-CM | POA: Diagnosis not present

## 2019-12-26 DIAGNOSIS — Z992 Dependence on renal dialysis: Secondary | ICD-10-CM | POA: Diagnosis not present

## 2019-12-26 DIAGNOSIS — N186 End stage renal disease: Secondary | ICD-10-CM | POA: Diagnosis not present

## 2019-12-26 DIAGNOSIS — Z4931 Encounter for adequacy testing for hemodialysis: Secondary | ICD-10-CM | POA: Diagnosis not present

## 2019-12-26 DIAGNOSIS — D509 Iron deficiency anemia, unspecified: Secondary | ICD-10-CM | POA: Diagnosis not present

## 2019-12-28 ENCOUNTER — Other Ambulatory Visit: Payer: Self-pay | Admitting: Obstetrics and Gynecology

## 2019-12-28 DIAGNOSIS — N186 End stage renal disease: Secondary | ICD-10-CM | POA: Diagnosis not present

## 2019-12-28 DIAGNOSIS — Z992 Dependence on renal dialysis: Secondary | ICD-10-CM | POA: Diagnosis not present

## 2019-12-28 DIAGNOSIS — N2581 Secondary hyperparathyroidism of renal origin: Secondary | ICD-10-CM | POA: Diagnosis not present

## 2019-12-28 DIAGNOSIS — D631 Anemia in chronic kidney disease: Secondary | ICD-10-CM | POA: Diagnosis not present

## 2019-12-28 DIAGNOSIS — Z4931 Encounter for adequacy testing for hemodialysis: Secondary | ICD-10-CM | POA: Diagnosis not present

## 2019-12-28 DIAGNOSIS — D509 Iron deficiency anemia, unspecified: Secondary | ICD-10-CM | POA: Diagnosis not present

## 2019-12-28 DIAGNOSIS — R928 Other abnormal and inconclusive findings on diagnostic imaging of breast: Secondary | ICD-10-CM

## 2019-12-29 DIAGNOSIS — N186 End stage renal disease: Secondary | ICD-10-CM | POA: Diagnosis not present

## 2019-12-29 DIAGNOSIS — Z992 Dependence on renal dialysis: Secondary | ICD-10-CM | POA: Diagnosis not present

## 2019-12-29 DIAGNOSIS — D509 Iron deficiency anemia, unspecified: Secondary | ICD-10-CM | POA: Diagnosis not present

## 2019-12-29 DIAGNOSIS — D631 Anemia in chronic kidney disease: Secondary | ICD-10-CM | POA: Diagnosis not present

## 2019-12-29 DIAGNOSIS — N2581 Secondary hyperparathyroidism of renal origin: Secondary | ICD-10-CM | POA: Diagnosis not present

## 2019-12-29 DIAGNOSIS — Z4931 Encounter for adequacy testing for hemodialysis: Secondary | ICD-10-CM | POA: Diagnosis not present

## 2019-12-30 DIAGNOSIS — N2581 Secondary hyperparathyroidism of renal origin: Secondary | ICD-10-CM | POA: Diagnosis not present

## 2019-12-30 DIAGNOSIS — D509 Iron deficiency anemia, unspecified: Secondary | ICD-10-CM | POA: Diagnosis not present

## 2019-12-30 DIAGNOSIS — Z992 Dependence on renal dialysis: Secondary | ICD-10-CM | POA: Diagnosis not present

## 2019-12-30 DIAGNOSIS — D631 Anemia in chronic kidney disease: Secondary | ICD-10-CM | POA: Diagnosis not present

## 2019-12-30 DIAGNOSIS — N186 End stage renal disease: Secondary | ICD-10-CM | POA: Diagnosis not present

## 2019-12-30 DIAGNOSIS — Z4931 Encounter for adequacy testing for hemodialysis: Secondary | ICD-10-CM | POA: Diagnosis not present

## 2020-01-02 DIAGNOSIS — D631 Anemia in chronic kidney disease: Secondary | ICD-10-CM | POA: Diagnosis not present

## 2020-01-02 DIAGNOSIS — Z992 Dependence on renal dialysis: Secondary | ICD-10-CM | POA: Diagnosis not present

## 2020-01-02 DIAGNOSIS — N186 End stage renal disease: Secondary | ICD-10-CM | POA: Diagnosis not present

## 2020-01-02 DIAGNOSIS — N2581 Secondary hyperparathyroidism of renal origin: Secondary | ICD-10-CM | POA: Diagnosis not present

## 2020-01-02 DIAGNOSIS — Z4931 Encounter for adequacy testing for hemodialysis: Secondary | ICD-10-CM | POA: Diagnosis not present

## 2020-01-02 DIAGNOSIS — D509 Iron deficiency anemia, unspecified: Secondary | ICD-10-CM | POA: Diagnosis not present

## 2020-01-02 DIAGNOSIS — M109 Gout, unspecified: Secondary | ICD-10-CM | POA: Diagnosis not present

## 2020-01-02 DIAGNOSIS — E1129 Type 2 diabetes mellitus with other diabetic kidney complication: Secondary | ICD-10-CM | POA: Diagnosis not present

## 2020-01-04 DIAGNOSIS — Z992 Dependence on renal dialysis: Secondary | ICD-10-CM | POA: Diagnosis not present

## 2020-01-04 DIAGNOSIS — Z4931 Encounter for adequacy testing for hemodialysis: Secondary | ICD-10-CM | POA: Diagnosis not present

## 2020-01-04 DIAGNOSIS — D509 Iron deficiency anemia, unspecified: Secondary | ICD-10-CM | POA: Diagnosis not present

## 2020-01-04 DIAGNOSIS — N186 End stage renal disease: Secondary | ICD-10-CM | POA: Diagnosis not present

## 2020-01-04 DIAGNOSIS — N2581 Secondary hyperparathyroidism of renal origin: Secondary | ICD-10-CM | POA: Diagnosis not present

## 2020-01-04 DIAGNOSIS — D631 Anemia in chronic kidney disease: Secondary | ICD-10-CM | POA: Diagnosis not present

## 2020-01-05 DIAGNOSIS — N2581 Secondary hyperparathyroidism of renal origin: Secondary | ICD-10-CM | POA: Diagnosis not present

## 2020-01-05 DIAGNOSIS — Z4931 Encounter for adequacy testing for hemodialysis: Secondary | ICD-10-CM | POA: Diagnosis not present

## 2020-01-05 DIAGNOSIS — N186 End stage renal disease: Secondary | ICD-10-CM | POA: Diagnosis not present

## 2020-01-05 DIAGNOSIS — D509 Iron deficiency anemia, unspecified: Secondary | ICD-10-CM | POA: Diagnosis not present

## 2020-01-05 DIAGNOSIS — Z992 Dependence on renal dialysis: Secondary | ICD-10-CM | POA: Diagnosis not present

## 2020-01-05 DIAGNOSIS — D631 Anemia in chronic kidney disease: Secondary | ICD-10-CM | POA: Diagnosis not present

## 2020-01-06 DIAGNOSIS — N2581 Secondary hyperparathyroidism of renal origin: Secondary | ICD-10-CM | POA: Diagnosis not present

## 2020-01-06 DIAGNOSIS — D509 Iron deficiency anemia, unspecified: Secondary | ICD-10-CM | POA: Diagnosis not present

## 2020-01-06 DIAGNOSIS — Z992 Dependence on renal dialysis: Secondary | ICD-10-CM | POA: Diagnosis not present

## 2020-01-06 DIAGNOSIS — N186 End stage renal disease: Secondary | ICD-10-CM | POA: Diagnosis not present

## 2020-01-06 DIAGNOSIS — D631 Anemia in chronic kidney disease: Secondary | ICD-10-CM | POA: Diagnosis not present

## 2020-01-06 DIAGNOSIS — Z4931 Encounter for adequacy testing for hemodialysis: Secondary | ICD-10-CM | POA: Diagnosis not present

## 2020-01-09 DIAGNOSIS — D509 Iron deficiency anemia, unspecified: Secondary | ICD-10-CM | POA: Diagnosis not present

## 2020-01-09 DIAGNOSIS — N2581 Secondary hyperparathyroidism of renal origin: Secondary | ICD-10-CM | POA: Diagnosis not present

## 2020-01-09 DIAGNOSIS — D631 Anemia in chronic kidney disease: Secondary | ICD-10-CM | POA: Diagnosis not present

## 2020-01-09 DIAGNOSIS — Z4931 Encounter for adequacy testing for hemodialysis: Secondary | ICD-10-CM | POA: Diagnosis not present

## 2020-01-09 DIAGNOSIS — N186 End stage renal disease: Secondary | ICD-10-CM | POA: Diagnosis not present

## 2020-01-09 DIAGNOSIS — Z992 Dependence on renal dialysis: Secondary | ICD-10-CM | POA: Diagnosis not present

## 2020-01-10 ENCOUNTER — Ambulatory Visit: Payer: PRIVATE HEALTH INSURANCE

## 2020-01-10 ENCOUNTER — Ambulatory Visit
Admission: RE | Admit: 2020-01-10 | Discharge: 2020-01-10 | Disposition: A | Discharge status: Home / Self Care | Payer: Medicare Other | Source: Ambulatory Visit | Attending: Obstetrics and Gynecology | Admitting: Obstetrics and Gynecology

## 2020-01-10 ENCOUNTER — Other Ambulatory Visit: Payer: Self-pay

## 2020-01-10 DIAGNOSIS — R922 Inconclusive mammogram: Secondary | ICD-10-CM | POA: Diagnosis not present

## 2020-01-10 DIAGNOSIS — R928 Other abnormal and inconclusive findings on diagnostic imaging of breast: Secondary | ICD-10-CM

## 2020-01-11 DIAGNOSIS — N2581 Secondary hyperparathyroidism of renal origin: Secondary | ICD-10-CM | POA: Diagnosis not present

## 2020-01-11 DIAGNOSIS — Z4931 Encounter for adequacy testing for hemodialysis: Secondary | ICD-10-CM | POA: Diagnosis not present

## 2020-01-11 DIAGNOSIS — D631 Anemia in chronic kidney disease: Secondary | ICD-10-CM | POA: Diagnosis not present

## 2020-01-11 DIAGNOSIS — N186 End stage renal disease: Secondary | ICD-10-CM | POA: Diagnosis not present

## 2020-01-11 DIAGNOSIS — Z992 Dependence on renal dialysis: Secondary | ICD-10-CM | POA: Diagnosis not present

## 2020-01-11 DIAGNOSIS — D509 Iron deficiency anemia, unspecified: Secondary | ICD-10-CM | POA: Diagnosis not present

## 2020-01-12 DIAGNOSIS — N186 End stage renal disease: Secondary | ICD-10-CM | POA: Diagnosis not present

## 2020-01-12 DIAGNOSIS — Z992 Dependence on renal dialysis: Secondary | ICD-10-CM | POA: Diagnosis not present

## 2020-01-12 DIAGNOSIS — Z4931 Encounter for adequacy testing for hemodialysis: Secondary | ICD-10-CM | POA: Diagnosis not present

## 2020-01-12 DIAGNOSIS — N2581 Secondary hyperparathyroidism of renal origin: Secondary | ICD-10-CM | POA: Diagnosis not present

## 2020-01-12 DIAGNOSIS — D631 Anemia in chronic kidney disease: Secondary | ICD-10-CM | POA: Diagnosis not present

## 2020-01-12 DIAGNOSIS — D509 Iron deficiency anemia, unspecified: Secondary | ICD-10-CM | POA: Diagnosis not present

## 2020-01-13 DIAGNOSIS — Z4931 Encounter for adequacy testing for hemodialysis: Secondary | ICD-10-CM | POA: Diagnosis not present

## 2020-01-13 DIAGNOSIS — N2581 Secondary hyperparathyroidism of renal origin: Secondary | ICD-10-CM | POA: Diagnosis not present

## 2020-01-13 DIAGNOSIS — D631 Anemia in chronic kidney disease: Secondary | ICD-10-CM | POA: Diagnosis not present

## 2020-01-13 DIAGNOSIS — Z992 Dependence on renal dialysis: Secondary | ICD-10-CM | POA: Diagnosis not present

## 2020-01-13 DIAGNOSIS — N186 End stage renal disease: Secondary | ICD-10-CM | POA: Diagnosis not present

## 2020-01-13 DIAGNOSIS — D509 Iron deficiency anemia, unspecified: Secondary | ICD-10-CM | POA: Diagnosis not present

## 2020-01-16 DIAGNOSIS — D509 Iron deficiency anemia, unspecified: Secondary | ICD-10-CM | POA: Diagnosis not present

## 2020-01-16 DIAGNOSIS — N2581 Secondary hyperparathyroidism of renal origin: Secondary | ICD-10-CM | POA: Diagnosis not present

## 2020-01-16 DIAGNOSIS — Z4931 Encounter for adequacy testing for hemodialysis: Secondary | ICD-10-CM | POA: Diagnosis not present

## 2020-01-16 DIAGNOSIS — D631 Anemia in chronic kidney disease: Secondary | ICD-10-CM | POA: Diagnosis not present

## 2020-01-16 DIAGNOSIS — Z992 Dependence on renal dialysis: Secondary | ICD-10-CM | POA: Diagnosis not present

## 2020-01-16 DIAGNOSIS — N186 End stage renal disease: Secondary | ICD-10-CM | POA: Diagnosis not present

## 2020-01-18 DIAGNOSIS — Z992 Dependence on renal dialysis: Secondary | ICD-10-CM | POA: Diagnosis not present

## 2020-01-18 DIAGNOSIS — N186 End stage renal disease: Secondary | ICD-10-CM | POA: Diagnosis not present

## 2020-01-18 DIAGNOSIS — Z4931 Encounter for adequacy testing for hemodialysis: Secondary | ICD-10-CM | POA: Diagnosis not present

## 2020-01-18 DIAGNOSIS — D631 Anemia in chronic kidney disease: Secondary | ICD-10-CM | POA: Diagnosis not present

## 2020-01-18 DIAGNOSIS — D509 Iron deficiency anemia, unspecified: Secondary | ICD-10-CM | POA: Diagnosis not present

## 2020-01-18 DIAGNOSIS — N2581 Secondary hyperparathyroidism of renal origin: Secondary | ICD-10-CM | POA: Diagnosis not present

## 2020-01-19 DIAGNOSIS — D509 Iron deficiency anemia, unspecified: Secondary | ICD-10-CM | POA: Diagnosis not present

## 2020-01-19 DIAGNOSIS — Z4931 Encounter for adequacy testing for hemodialysis: Secondary | ICD-10-CM | POA: Diagnosis not present

## 2020-01-19 DIAGNOSIS — D631 Anemia in chronic kidney disease: Secondary | ICD-10-CM | POA: Diagnosis not present

## 2020-01-19 DIAGNOSIS — N2581 Secondary hyperparathyroidism of renal origin: Secondary | ICD-10-CM | POA: Diagnosis not present

## 2020-01-19 DIAGNOSIS — Z992 Dependence on renal dialysis: Secondary | ICD-10-CM | POA: Diagnosis not present

## 2020-01-19 DIAGNOSIS — N186 End stage renal disease: Secondary | ICD-10-CM | POA: Diagnosis not present

## 2020-01-20 DIAGNOSIS — D509 Iron deficiency anemia, unspecified: Secondary | ICD-10-CM | POA: Diagnosis not present

## 2020-01-20 DIAGNOSIS — Z4931 Encounter for adequacy testing for hemodialysis: Secondary | ICD-10-CM | POA: Diagnosis not present

## 2020-01-20 DIAGNOSIS — Z992 Dependence on renal dialysis: Secondary | ICD-10-CM | POA: Diagnosis not present

## 2020-01-20 DIAGNOSIS — N186 End stage renal disease: Secondary | ICD-10-CM | POA: Diagnosis not present

## 2020-01-20 DIAGNOSIS — N2581 Secondary hyperparathyroidism of renal origin: Secondary | ICD-10-CM | POA: Diagnosis not present

## 2020-01-20 DIAGNOSIS — D631 Anemia in chronic kidney disease: Secondary | ICD-10-CM | POA: Diagnosis not present

## 2020-01-22 DIAGNOSIS — Z992 Dependence on renal dialysis: Secondary | ICD-10-CM | POA: Diagnosis not present

## 2020-01-22 DIAGNOSIS — T8610 Unspecified complication of kidney transplant: Secondary | ICD-10-CM | POA: Diagnosis not present

## 2020-01-22 DIAGNOSIS — N186 End stage renal disease: Secondary | ICD-10-CM | POA: Diagnosis not present

## 2020-01-23 DIAGNOSIS — N186 End stage renal disease: Secondary | ICD-10-CM | POA: Diagnosis not present

## 2020-01-23 DIAGNOSIS — I120 Hypertensive chronic kidney disease with stage 5 chronic kidney disease or end stage renal disease: Secondary | ICD-10-CM | POA: Diagnosis not present

## 2020-01-23 DIAGNOSIS — R519 Headache, unspecified: Secondary | ICD-10-CM | POA: Diagnosis not present

## 2020-01-23 DIAGNOSIS — Z992 Dependence on renal dialysis: Secondary | ICD-10-CM | POA: Diagnosis not present

## 2020-01-23 DIAGNOSIS — I953 Hypotension of hemodialysis: Secondary | ICD-10-CM | POA: Diagnosis not present

## 2020-01-23 DIAGNOSIS — E1122 Type 2 diabetes mellitus with diabetic chronic kidney disease: Secondary | ICD-10-CM | POA: Diagnosis not present

## 2020-01-23 DIAGNOSIS — Z794 Long term (current) use of insulin: Secondary | ICD-10-CM | POA: Diagnosis not present

## 2020-01-23 DIAGNOSIS — N2581 Secondary hyperparathyroidism of renal origin: Secondary | ICD-10-CM | POA: Diagnosis not present

## 2020-01-23 DIAGNOSIS — R252 Cramp and spasm: Secondary | ICD-10-CM | POA: Diagnosis not present

## 2020-01-23 DIAGNOSIS — D509 Iron deficiency anemia, unspecified: Secondary | ICD-10-CM | POA: Diagnosis not present

## 2020-01-25 DIAGNOSIS — N2581 Secondary hyperparathyroidism of renal origin: Secondary | ICD-10-CM | POA: Diagnosis not present

## 2020-01-25 DIAGNOSIS — R519 Headache, unspecified: Secondary | ICD-10-CM | POA: Diagnosis not present

## 2020-01-25 DIAGNOSIS — Z794 Long term (current) use of insulin: Secondary | ICD-10-CM | POA: Diagnosis not present

## 2020-01-25 DIAGNOSIS — D509 Iron deficiency anemia, unspecified: Secondary | ICD-10-CM | POA: Diagnosis not present

## 2020-01-25 DIAGNOSIS — N186 End stage renal disease: Secondary | ICD-10-CM | POA: Diagnosis not present

## 2020-01-25 DIAGNOSIS — Z992 Dependence on renal dialysis: Secondary | ICD-10-CM | POA: Diagnosis not present

## 2020-01-26 DIAGNOSIS — N186 End stage renal disease: Secondary | ICD-10-CM | POA: Diagnosis not present

## 2020-01-26 DIAGNOSIS — Z794 Long term (current) use of insulin: Secondary | ICD-10-CM | POA: Diagnosis not present

## 2020-01-26 DIAGNOSIS — R519 Headache, unspecified: Secondary | ICD-10-CM | POA: Diagnosis not present

## 2020-01-26 DIAGNOSIS — M109 Gout, unspecified: Secondary | ICD-10-CM | POA: Diagnosis not present

## 2020-01-26 DIAGNOSIS — D509 Iron deficiency anemia, unspecified: Secondary | ICD-10-CM | POA: Diagnosis not present

## 2020-01-26 DIAGNOSIS — N2581 Secondary hyperparathyroidism of renal origin: Secondary | ICD-10-CM | POA: Diagnosis not present

## 2020-01-26 DIAGNOSIS — Z992 Dependence on renal dialysis: Secondary | ICD-10-CM | POA: Diagnosis not present

## 2020-01-27 DIAGNOSIS — N2581 Secondary hyperparathyroidism of renal origin: Secondary | ICD-10-CM | POA: Diagnosis not present

## 2020-01-27 DIAGNOSIS — N186 End stage renal disease: Secondary | ICD-10-CM | POA: Diagnosis not present

## 2020-01-27 DIAGNOSIS — Z992 Dependence on renal dialysis: Secondary | ICD-10-CM | POA: Diagnosis not present

## 2020-01-27 DIAGNOSIS — R519 Headache, unspecified: Secondary | ICD-10-CM | POA: Diagnosis not present

## 2020-01-27 DIAGNOSIS — N898 Other specified noninflammatory disorders of vagina: Secondary | ICD-10-CM | POA: Diagnosis not present

## 2020-01-27 DIAGNOSIS — D509 Iron deficiency anemia, unspecified: Secondary | ICD-10-CM | POA: Diagnosis not present

## 2020-01-27 DIAGNOSIS — Z794 Long term (current) use of insulin: Secondary | ICD-10-CM | POA: Diagnosis not present

## 2020-01-30 DIAGNOSIS — Z794 Long term (current) use of insulin: Secondary | ICD-10-CM | POA: Diagnosis not present

## 2020-01-30 DIAGNOSIS — Z992 Dependence on renal dialysis: Secondary | ICD-10-CM | POA: Diagnosis not present

## 2020-01-30 DIAGNOSIS — N186 End stage renal disease: Secondary | ICD-10-CM | POA: Diagnosis not present

## 2020-01-30 DIAGNOSIS — N2581 Secondary hyperparathyroidism of renal origin: Secondary | ICD-10-CM | POA: Diagnosis not present

## 2020-01-30 DIAGNOSIS — D509 Iron deficiency anemia, unspecified: Secondary | ICD-10-CM | POA: Diagnosis not present

## 2020-01-30 DIAGNOSIS — R519 Headache, unspecified: Secondary | ICD-10-CM | POA: Diagnosis not present

## 2020-02-01 DIAGNOSIS — N2581 Secondary hyperparathyroidism of renal origin: Secondary | ICD-10-CM | POA: Diagnosis not present

## 2020-02-01 DIAGNOSIS — D509 Iron deficiency anemia, unspecified: Secondary | ICD-10-CM | POA: Diagnosis not present

## 2020-02-01 DIAGNOSIS — Z794 Long term (current) use of insulin: Secondary | ICD-10-CM | POA: Diagnosis not present

## 2020-02-01 DIAGNOSIS — R519 Headache, unspecified: Secondary | ICD-10-CM | POA: Diagnosis not present

## 2020-02-01 DIAGNOSIS — N186 End stage renal disease: Secondary | ICD-10-CM | POA: Diagnosis not present

## 2020-02-01 DIAGNOSIS — Z992 Dependence on renal dialysis: Secondary | ICD-10-CM | POA: Diagnosis not present

## 2020-02-02 DIAGNOSIS — N2581 Secondary hyperparathyroidism of renal origin: Secondary | ICD-10-CM | POA: Diagnosis not present

## 2020-02-02 DIAGNOSIS — R519 Headache, unspecified: Secondary | ICD-10-CM | POA: Diagnosis not present

## 2020-02-02 DIAGNOSIS — D509 Iron deficiency anemia, unspecified: Secondary | ICD-10-CM | POA: Diagnosis not present

## 2020-02-02 DIAGNOSIS — Z794 Long term (current) use of insulin: Secondary | ICD-10-CM | POA: Diagnosis not present

## 2020-02-02 DIAGNOSIS — N186 End stage renal disease: Secondary | ICD-10-CM | POA: Diagnosis not present

## 2020-02-02 DIAGNOSIS — Z992 Dependence on renal dialysis: Secondary | ICD-10-CM | POA: Diagnosis not present

## 2020-02-03 DIAGNOSIS — N2581 Secondary hyperparathyroidism of renal origin: Secondary | ICD-10-CM | POA: Diagnosis not present

## 2020-02-03 DIAGNOSIS — R519 Headache, unspecified: Secondary | ICD-10-CM | POA: Diagnosis not present

## 2020-02-03 DIAGNOSIS — N186 End stage renal disease: Secondary | ICD-10-CM | POA: Diagnosis not present

## 2020-02-03 DIAGNOSIS — Z794 Long term (current) use of insulin: Secondary | ICD-10-CM | POA: Diagnosis not present

## 2020-02-03 DIAGNOSIS — Z992 Dependence on renal dialysis: Secondary | ICD-10-CM | POA: Diagnosis not present

## 2020-02-03 DIAGNOSIS — D509 Iron deficiency anemia, unspecified: Secondary | ICD-10-CM | POA: Diagnosis not present

## 2020-02-06 DIAGNOSIS — N2581 Secondary hyperparathyroidism of renal origin: Secondary | ICD-10-CM | POA: Diagnosis not present

## 2020-02-06 DIAGNOSIS — D509 Iron deficiency anemia, unspecified: Secondary | ICD-10-CM | POA: Diagnosis not present

## 2020-02-06 DIAGNOSIS — R519 Headache, unspecified: Secondary | ICD-10-CM | POA: Diagnosis not present

## 2020-02-06 DIAGNOSIS — Z992 Dependence on renal dialysis: Secondary | ICD-10-CM | POA: Diagnosis not present

## 2020-02-06 DIAGNOSIS — N186 End stage renal disease: Secondary | ICD-10-CM | POA: Diagnosis not present

## 2020-02-06 DIAGNOSIS — Z794 Long term (current) use of insulin: Secondary | ICD-10-CM | POA: Diagnosis not present

## 2020-02-08 DIAGNOSIS — D509 Iron deficiency anemia, unspecified: Secondary | ICD-10-CM | POA: Diagnosis not present

## 2020-02-08 DIAGNOSIS — Z794 Long term (current) use of insulin: Secondary | ICD-10-CM | POA: Diagnosis not present

## 2020-02-08 DIAGNOSIS — N186 End stage renal disease: Secondary | ICD-10-CM | POA: Diagnosis not present

## 2020-02-08 DIAGNOSIS — Z992 Dependence on renal dialysis: Secondary | ICD-10-CM | POA: Diagnosis not present

## 2020-02-08 DIAGNOSIS — N2581 Secondary hyperparathyroidism of renal origin: Secondary | ICD-10-CM | POA: Diagnosis not present

## 2020-02-08 DIAGNOSIS — R519 Headache, unspecified: Secondary | ICD-10-CM | POA: Diagnosis not present

## 2020-02-09 DIAGNOSIS — Z992 Dependence on renal dialysis: Secondary | ICD-10-CM | POA: Diagnosis not present

## 2020-02-09 DIAGNOSIS — D509 Iron deficiency anemia, unspecified: Secondary | ICD-10-CM | POA: Diagnosis not present

## 2020-02-09 DIAGNOSIS — N186 End stage renal disease: Secondary | ICD-10-CM | POA: Diagnosis not present

## 2020-02-09 DIAGNOSIS — N2581 Secondary hyperparathyroidism of renal origin: Secondary | ICD-10-CM | POA: Diagnosis not present

## 2020-02-09 DIAGNOSIS — R519 Headache, unspecified: Secondary | ICD-10-CM | POA: Diagnosis not present

## 2020-02-09 DIAGNOSIS — Z794 Long term (current) use of insulin: Secondary | ICD-10-CM | POA: Diagnosis not present

## 2020-02-10 DIAGNOSIS — R519 Headache, unspecified: Secondary | ICD-10-CM | POA: Diagnosis not present

## 2020-02-10 DIAGNOSIS — N186 End stage renal disease: Secondary | ICD-10-CM | POA: Diagnosis not present

## 2020-02-10 DIAGNOSIS — D509 Iron deficiency anemia, unspecified: Secondary | ICD-10-CM | POA: Diagnosis not present

## 2020-02-10 DIAGNOSIS — Z794 Long term (current) use of insulin: Secondary | ICD-10-CM | POA: Diagnosis not present

## 2020-02-10 DIAGNOSIS — N2581 Secondary hyperparathyroidism of renal origin: Secondary | ICD-10-CM | POA: Diagnosis not present

## 2020-02-10 DIAGNOSIS — Z992 Dependence on renal dialysis: Secondary | ICD-10-CM | POA: Diagnosis not present

## 2020-02-13 DIAGNOSIS — N2581 Secondary hyperparathyroidism of renal origin: Secondary | ICD-10-CM | POA: Diagnosis not present

## 2020-02-13 DIAGNOSIS — N186 End stage renal disease: Secondary | ICD-10-CM | POA: Diagnosis not present

## 2020-02-13 DIAGNOSIS — Z794 Long term (current) use of insulin: Secondary | ICD-10-CM | POA: Diagnosis not present

## 2020-02-13 DIAGNOSIS — R519 Headache, unspecified: Secondary | ICD-10-CM | POA: Diagnosis not present

## 2020-02-13 DIAGNOSIS — Z992 Dependence on renal dialysis: Secondary | ICD-10-CM | POA: Diagnosis not present

## 2020-02-13 DIAGNOSIS — D509 Iron deficiency anemia, unspecified: Secondary | ICD-10-CM | POA: Diagnosis not present

## 2020-02-15 DIAGNOSIS — N186 End stage renal disease: Secondary | ICD-10-CM | POA: Diagnosis not present

## 2020-02-15 DIAGNOSIS — D509 Iron deficiency anemia, unspecified: Secondary | ICD-10-CM | POA: Diagnosis not present

## 2020-02-15 DIAGNOSIS — N2581 Secondary hyperparathyroidism of renal origin: Secondary | ICD-10-CM | POA: Diagnosis not present

## 2020-02-15 DIAGNOSIS — Z794 Long term (current) use of insulin: Secondary | ICD-10-CM | POA: Diagnosis not present

## 2020-02-15 DIAGNOSIS — R519 Headache, unspecified: Secondary | ICD-10-CM | POA: Diagnosis not present

## 2020-02-15 DIAGNOSIS — Z992 Dependence on renal dialysis: Secondary | ICD-10-CM | POA: Diagnosis not present

## 2020-02-16 DIAGNOSIS — D509 Iron deficiency anemia, unspecified: Secondary | ICD-10-CM | POA: Diagnosis not present

## 2020-02-16 DIAGNOSIS — N2581 Secondary hyperparathyroidism of renal origin: Secondary | ICD-10-CM | POA: Diagnosis not present

## 2020-02-16 DIAGNOSIS — Z794 Long term (current) use of insulin: Secondary | ICD-10-CM | POA: Diagnosis not present

## 2020-02-16 DIAGNOSIS — N186 End stage renal disease: Secondary | ICD-10-CM | POA: Diagnosis not present

## 2020-02-16 DIAGNOSIS — Z992 Dependence on renal dialysis: Secondary | ICD-10-CM | POA: Diagnosis not present

## 2020-02-16 DIAGNOSIS — R519 Headache, unspecified: Secondary | ICD-10-CM | POA: Diagnosis not present

## 2020-02-17 DIAGNOSIS — N186 End stage renal disease: Secondary | ICD-10-CM | POA: Diagnosis not present

## 2020-02-17 DIAGNOSIS — D509 Iron deficiency anemia, unspecified: Secondary | ICD-10-CM | POA: Diagnosis not present

## 2020-02-17 DIAGNOSIS — N2581 Secondary hyperparathyroidism of renal origin: Secondary | ICD-10-CM | POA: Diagnosis not present

## 2020-02-17 DIAGNOSIS — Z992 Dependence on renal dialysis: Secondary | ICD-10-CM | POA: Diagnosis not present

## 2020-02-17 DIAGNOSIS — R519 Headache, unspecified: Secondary | ICD-10-CM | POA: Diagnosis not present

## 2020-02-17 DIAGNOSIS — Z794 Long term (current) use of insulin: Secondary | ICD-10-CM | POA: Diagnosis not present

## 2020-02-20 DIAGNOSIS — N2581 Secondary hyperparathyroidism of renal origin: Secondary | ICD-10-CM | POA: Diagnosis not present

## 2020-02-20 DIAGNOSIS — N186 End stage renal disease: Secondary | ICD-10-CM | POA: Diagnosis not present

## 2020-02-20 DIAGNOSIS — R519 Headache, unspecified: Secondary | ICD-10-CM | POA: Diagnosis not present

## 2020-02-20 DIAGNOSIS — Z992 Dependence on renal dialysis: Secondary | ICD-10-CM | POA: Diagnosis not present

## 2020-02-20 DIAGNOSIS — D509 Iron deficiency anemia, unspecified: Secondary | ICD-10-CM | POA: Diagnosis not present

## 2020-02-20 DIAGNOSIS — Z794 Long term (current) use of insulin: Secondary | ICD-10-CM | POA: Diagnosis not present

## 2020-02-22 DIAGNOSIS — I953 Hypotension of hemodialysis: Secondary | ICD-10-CM | POA: Diagnosis not present

## 2020-02-22 DIAGNOSIS — I120 Hypertensive chronic kidney disease with stage 5 chronic kidney disease or end stage renal disease: Secondary | ICD-10-CM | POA: Diagnosis not present

## 2020-02-22 DIAGNOSIS — Z794 Long term (current) use of insulin: Secondary | ICD-10-CM | POA: Diagnosis not present

## 2020-02-22 DIAGNOSIS — T8610 Unspecified complication of kidney transplant: Secondary | ICD-10-CM | POA: Diagnosis not present

## 2020-02-22 DIAGNOSIS — D509 Iron deficiency anemia, unspecified: Secondary | ICD-10-CM | POA: Diagnosis not present

## 2020-02-22 DIAGNOSIS — N2581 Secondary hyperparathyroidism of renal origin: Secondary | ICD-10-CM | POA: Diagnosis not present

## 2020-02-22 DIAGNOSIS — E1122 Type 2 diabetes mellitus with diabetic chronic kidney disease: Secondary | ICD-10-CM | POA: Diagnosis not present

## 2020-02-22 DIAGNOSIS — R252 Cramp and spasm: Secondary | ICD-10-CM | POA: Diagnosis not present

## 2020-02-22 DIAGNOSIS — Z992 Dependence on renal dialysis: Secondary | ICD-10-CM | POA: Diagnosis not present

## 2020-02-22 DIAGNOSIS — R519 Headache, unspecified: Secondary | ICD-10-CM | POA: Diagnosis not present

## 2020-02-22 DIAGNOSIS — N186 End stage renal disease: Secondary | ICD-10-CM | POA: Diagnosis not present

## 2020-02-23 DIAGNOSIS — D509 Iron deficiency anemia, unspecified: Secondary | ICD-10-CM | POA: Diagnosis not present

## 2020-02-23 DIAGNOSIS — Z992 Dependence on renal dialysis: Secondary | ICD-10-CM | POA: Diagnosis not present

## 2020-02-23 DIAGNOSIS — N186 End stage renal disease: Secondary | ICD-10-CM | POA: Diagnosis not present

## 2020-02-23 DIAGNOSIS — R519 Headache, unspecified: Secondary | ICD-10-CM | POA: Diagnosis not present

## 2020-02-23 DIAGNOSIS — N2581 Secondary hyperparathyroidism of renal origin: Secondary | ICD-10-CM | POA: Diagnosis not present

## 2020-02-23 DIAGNOSIS — Z794 Long term (current) use of insulin: Secondary | ICD-10-CM | POA: Diagnosis not present

## 2020-02-24 DIAGNOSIS — I120 Hypertensive chronic kidney disease with stage 5 chronic kidney disease or end stage renal disease: Secondary | ICD-10-CM | POA: Diagnosis not present

## 2020-02-24 DIAGNOSIS — Z794 Long term (current) use of insulin: Secondary | ICD-10-CM | POA: Diagnosis not present

## 2020-02-24 DIAGNOSIS — R519 Headache, unspecified: Secondary | ICD-10-CM | POA: Diagnosis not present

## 2020-02-24 DIAGNOSIS — N2581 Secondary hyperparathyroidism of renal origin: Secondary | ICD-10-CM | POA: Diagnosis not present

## 2020-02-24 DIAGNOSIS — R9439 Abnormal result of other cardiovascular function study: Secondary | ICD-10-CM | POA: Diagnosis not present

## 2020-02-24 DIAGNOSIS — E1122 Type 2 diabetes mellitus with diabetic chronic kidney disease: Secondary | ICD-10-CM | POA: Diagnosis not present

## 2020-02-24 DIAGNOSIS — Z992 Dependence on renal dialysis: Secondary | ICD-10-CM | POA: Diagnosis not present

## 2020-02-24 DIAGNOSIS — D509 Iron deficiency anemia, unspecified: Secondary | ICD-10-CM | POA: Diagnosis not present

## 2020-02-24 DIAGNOSIS — N186 End stage renal disease: Secondary | ICD-10-CM | POA: Diagnosis not present

## 2020-02-24 DIAGNOSIS — Z940 Kidney transplant status: Secondary | ICD-10-CM | POA: Diagnosis not present

## 2020-02-27 DIAGNOSIS — Z992 Dependence on renal dialysis: Secondary | ICD-10-CM | POA: Diagnosis not present

## 2020-02-27 DIAGNOSIS — N186 End stage renal disease: Secondary | ICD-10-CM | POA: Diagnosis not present

## 2020-02-27 DIAGNOSIS — N2581 Secondary hyperparathyroidism of renal origin: Secondary | ICD-10-CM | POA: Diagnosis not present

## 2020-02-27 DIAGNOSIS — D509 Iron deficiency anemia, unspecified: Secondary | ICD-10-CM | POA: Diagnosis not present

## 2020-02-27 DIAGNOSIS — R519 Headache, unspecified: Secondary | ICD-10-CM | POA: Diagnosis not present

## 2020-02-27 DIAGNOSIS — Z794 Long term (current) use of insulin: Secondary | ICD-10-CM | POA: Diagnosis not present

## 2020-02-29 DIAGNOSIS — Z794 Long term (current) use of insulin: Secondary | ICD-10-CM | POA: Diagnosis not present

## 2020-02-29 DIAGNOSIS — R519 Headache, unspecified: Secondary | ICD-10-CM | POA: Diagnosis not present

## 2020-02-29 DIAGNOSIS — N2581 Secondary hyperparathyroidism of renal origin: Secondary | ICD-10-CM | POA: Diagnosis not present

## 2020-02-29 DIAGNOSIS — Z992 Dependence on renal dialysis: Secondary | ICD-10-CM | POA: Diagnosis not present

## 2020-02-29 DIAGNOSIS — N186 End stage renal disease: Secondary | ICD-10-CM | POA: Diagnosis not present

## 2020-02-29 DIAGNOSIS — D509 Iron deficiency anemia, unspecified: Secondary | ICD-10-CM | POA: Diagnosis not present

## 2020-03-01 DIAGNOSIS — Z992 Dependence on renal dialysis: Secondary | ICD-10-CM | POA: Diagnosis not present

## 2020-03-01 DIAGNOSIS — N2581 Secondary hyperparathyroidism of renal origin: Secondary | ICD-10-CM | POA: Diagnosis not present

## 2020-03-01 DIAGNOSIS — Z794 Long term (current) use of insulin: Secondary | ICD-10-CM | POA: Diagnosis not present

## 2020-03-01 DIAGNOSIS — R519 Headache, unspecified: Secondary | ICD-10-CM | POA: Diagnosis not present

## 2020-03-01 DIAGNOSIS — N186 End stage renal disease: Secondary | ICD-10-CM | POA: Diagnosis not present

## 2020-03-01 DIAGNOSIS — D509 Iron deficiency anemia, unspecified: Secondary | ICD-10-CM | POA: Diagnosis not present

## 2020-03-02 DIAGNOSIS — N186 End stage renal disease: Secondary | ICD-10-CM | POA: Diagnosis not present

## 2020-03-02 DIAGNOSIS — R519 Headache, unspecified: Secondary | ICD-10-CM | POA: Diagnosis not present

## 2020-03-02 DIAGNOSIS — Z992 Dependence on renal dialysis: Secondary | ICD-10-CM | POA: Diagnosis not present

## 2020-03-02 DIAGNOSIS — D509 Iron deficiency anemia, unspecified: Secondary | ICD-10-CM | POA: Diagnosis not present

## 2020-03-02 DIAGNOSIS — Z794 Long term (current) use of insulin: Secondary | ICD-10-CM | POA: Diagnosis not present

## 2020-03-02 DIAGNOSIS — N2581 Secondary hyperparathyroidism of renal origin: Secondary | ICD-10-CM | POA: Diagnosis not present

## 2020-03-05 DIAGNOSIS — N2581 Secondary hyperparathyroidism of renal origin: Secondary | ICD-10-CM | POA: Diagnosis not present

## 2020-03-05 DIAGNOSIS — N186 End stage renal disease: Secondary | ICD-10-CM | POA: Diagnosis not present

## 2020-03-05 DIAGNOSIS — Z992 Dependence on renal dialysis: Secondary | ICD-10-CM | POA: Diagnosis not present

## 2020-03-05 DIAGNOSIS — D509 Iron deficiency anemia, unspecified: Secondary | ICD-10-CM | POA: Diagnosis not present

## 2020-03-05 DIAGNOSIS — Z794 Long term (current) use of insulin: Secondary | ICD-10-CM | POA: Diagnosis not present

## 2020-03-05 DIAGNOSIS — R519 Headache, unspecified: Secondary | ICD-10-CM | POA: Diagnosis not present

## 2020-03-06 DIAGNOSIS — N186 End stage renal disease: Secondary | ICD-10-CM | POA: Diagnosis not present

## 2020-03-06 DIAGNOSIS — Z794 Long term (current) use of insulin: Secondary | ICD-10-CM | POA: Diagnosis not present

## 2020-03-06 DIAGNOSIS — Z992 Dependence on renal dialysis: Secondary | ICD-10-CM | POA: Diagnosis not present

## 2020-03-06 DIAGNOSIS — N2581 Secondary hyperparathyroidism of renal origin: Secondary | ICD-10-CM | POA: Diagnosis not present

## 2020-03-06 DIAGNOSIS — M109 Gout, unspecified: Secondary | ICD-10-CM | POA: Diagnosis not present

## 2020-03-06 DIAGNOSIS — D509 Iron deficiency anemia, unspecified: Secondary | ICD-10-CM | POA: Diagnosis not present

## 2020-03-06 DIAGNOSIS — R519 Headache, unspecified: Secondary | ICD-10-CM | POA: Diagnosis not present

## 2020-03-07 DIAGNOSIS — D509 Iron deficiency anemia, unspecified: Secondary | ICD-10-CM | POA: Diagnosis not present

## 2020-03-07 DIAGNOSIS — N2581 Secondary hyperparathyroidism of renal origin: Secondary | ICD-10-CM | POA: Diagnosis not present

## 2020-03-07 DIAGNOSIS — R519 Headache, unspecified: Secondary | ICD-10-CM | POA: Diagnosis not present

## 2020-03-07 DIAGNOSIS — Z794 Long term (current) use of insulin: Secondary | ICD-10-CM | POA: Diagnosis not present

## 2020-03-07 DIAGNOSIS — N186 End stage renal disease: Secondary | ICD-10-CM | POA: Diagnosis not present

## 2020-03-07 DIAGNOSIS — Z992 Dependence on renal dialysis: Secondary | ICD-10-CM | POA: Diagnosis not present

## 2020-03-08 DIAGNOSIS — R9430 Abnormal result of cardiovascular function study, unspecified: Secondary | ICD-10-CM | POA: Diagnosis not present

## 2020-03-08 DIAGNOSIS — I120 Hypertensive chronic kidney disease with stage 5 chronic kidney disease or end stage renal disease: Secondary | ICD-10-CM | POA: Diagnosis not present

## 2020-03-08 DIAGNOSIS — R9439 Abnormal result of other cardiovascular function study: Secondary | ICD-10-CM | POA: Diagnosis not present

## 2020-03-08 DIAGNOSIS — R519 Headache, unspecified: Secondary | ICD-10-CM | POA: Diagnosis not present

## 2020-03-08 DIAGNOSIS — Z992 Dependence on renal dialysis: Secondary | ICD-10-CM | POA: Diagnosis not present

## 2020-03-08 DIAGNOSIS — N2581 Secondary hyperparathyroidism of renal origin: Secondary | ICD-10-CM | POA: Diagnosis not present

## 2020-03-08 DIAGNOSIS — Z794 Long term (current) use of insulin: Secondary | ICD-10-CM | POA: Diagnosis not present

## 2020-03-08 DIAGNOSIS — Z7682 Awaiting organ transplant status: Secondary | ICD-10-CM | POA: Diagnosis not present

## 2020-03-08 DIAGNOSIS — N186 End stage renal disease: Secondary | ICD-10-CM | POA: Diagnosis not present

## 2020-03-08 DIAGNOSIS — D509 Iron deficiency anemia, unspecified: Secondary | ICD-10-CM | POA: Diagnosis not present

## 2020-03-08 DIAGNOSIS — E785 Hyperlipidemia, unspecified: Secondary | ICD-10-CM | POA: Diagnosis not present

## 2020-03-08 DIAGNOSIS — E1122 Type 2 diabetes mellitus with diabetic chronic kidney disease: Secondary | ICD-10-CM | POA: Diagnosis not present

## 2020-03-09 DIAGNOSIS — N2581 Secondary hyperparathyroidism of renal origin: Secondary | ICD-10-CM | POA: Diagnosis not present

## 2020-03-09 DIAGNOSIS — Z794 Long term (current) use of insulin: Secondary | ICD-10-CM | POA: Diagnosis not present

## 2020-03-09 DIAGNOSIS — R519 Headache, unspecified: Secondary | ICD-10-CM | POA: Diagnosis not present

## 2020-03-09 DIAGNOSIS — D509 Iron deficiency anemia, unspecified: Secondary | ICD-10-CM | POA: Diagnosis not present

## 2020-03-09 DIAGNOSIS — N186 End stage renal disease: Secondary | ICD-10-CM | POA: Diagnosis not present

## 2020-03-09 DIAGNOSIS — Z992 Dependence on renal dialysis: Secondary | ICD-10-CM | POA: Diagnosis not present

## 2020-03-12 DIAGNOSIS — Z992 Dependence on renal dialysis: Secondary | ICD-10-CM | POA: Diagnosis not present

## 2020-03-12 DIAGNOSIS — N2581 Secondary hyperparathyroidism of renal origin: Secondary | ICD-10-CM | POA: Diagnosis not present

## 2020-03-12 DIAGNOSIS — Z794 Long term (current) use of insulin: Secondary | ICD-10-CM | POA: Diagnosis not present

## 2020-03-12 DIAGNOSIS — D509 Iron deficiency anemia, unspecified: Secondary | ICD-10-CM | POA: Diagnosis not present

## 2020-03-12 DIAGNOSIS — R519 Headache, unspecified: Secondary | ICD-10-CM | POA: Diagnosis not present

## 2020-03-12 DIAGNOSIS — N186 End stage renal disease: Secondary | ICD-10-CM | POA: Diagnosis not present

## 2020-03-14 DIAGNOSIS — N186 End stage renal disease: Secondary | ICD-10-CM | POA: Diagnosis not present

## 2020-03-14 DIAGNOSIS — R519 Headache, unspecified: Secondary | ICD-10-CM | POA: Diagnosis not present

## 2020-03-14 DIAGNOSIS — D509 Iron deficiency anemia, unspecified: Secondary | ICD-10-CM | POA: Diagnosis not present

## 2020-03-14 DIAGNOSIS — Z992 Dependence on renal dialysis: Secondary | ICD-10-CM | POA: Diagnosis not present

## 2020-03-14 DIAGNOSIS — Z794 Long term (current) use of insulin: Secondary | ICD-10-CM | POA: Diagnosis not present

## 2020-03-14 DIAGNOSIS — N2581 Secondary hyperparathyroidism of renal origin: Secondary | ICD-10-CM | POA: Diagnosis not present

## 2020-03-15 DIAGNOSIS — Z992 Dependence on renal dialysis: Secondary | ICD-10-CM | POA: Diagnosis not present

## 2020-03-15 DIAGNOSIS — Z794 Long term (current) use of insulin: Secondary | ICD-10-CM | POA: Diagnosis not present

## 2020-03-15 DIAGNOSIS — N186 End stage renal disease: Secondary | ICD-10-CM | POA: Diagnosis not present

## 2020-03-15 DIAGNOSIS — D509 Iron deficiency anemia, unspecified: Secondary | ICD-10-CM | POA: Diagnosis not present

## 2020-03-15 DIAGNOSIS — R519 Headache, unspecified: Secondary | ICD-10-CM | POA: Diagnosis not present

## 2020-03-15 DIAGNOSIS — N2581 Secondary hyperparathyroidism of renal origin: Secondary | ICD-10-CM | POA: Diagnosis not present

## 2020-03-16 DIAGNOSIS — R519 Headache, unspecified: Secondary | ICD-10-CM | POA: Diagnosis not present

## 2020-03-16 DIAGNOSIS — D509 Iron deficiency anemia, unspecified: Secondary | ICD-10-CM | POA: Diagnosis not present

## 2020-03-16 DIAGNOSIS — Z794 Long term (current) use of insulin: Secondary | ICD-10-CM | POA: Diagnosis not present

## 2020-03-16 DIAGNOSIS — N2581 Secondary hyperparathyroidism of renal origin: Secondary | ICD-10-CM | POA: Diagnosis not present

## 2020-03-16 DIAGNOSIS — N186 End stage renal disease: Secondary | ICD-10-CM | POA: Diagnosis not present

## 2020-03-16 DIAGNOSIS — Z992 Dependence on renal dialysis: Secondary | ICD-10-CM | POA: Diagnosis not present

## 2020-03-19 DIAGNOSIS — N186 End stage renal disease: Secondary | ICD-10-CM | POA: Diagnosis not present

## 2020-03-19 DIAGNOSIS — R519 Headache, unspecified: Secondary | ICD-10-CM | POA: Diagnosis not present

## 2020-03-19 DIAGNOSIS — Z794 Long term (current) use of insulin: Secondary | ICD-10-CM | POA: Diagnosis not present

## 2020-03-19 DIAGNOSIS — N2581 Secondary hyperparathyroidism of renal origin: Secondary | ICD-10-CM | POA: Diagnosis not present

## 2020-03-19 DIAGNOSIS — Z992 Dependence on renal dialysis: Secondary | ICD-10-CM | POA: Diagnosis not present

## 2020-03-19 DIAGNOSIS — D509 Iron deficiency anemia, unspecified: Secondary | ICD-10-CM | POA: Diagnosis not present

## 2020-03-21 DIAGNOSIS — R519 Headache, unspecified: Secondary | ICD-10-CM | POA: Diagnosis not present

## 2020-03-21 DIAGNOSIS — Z794 Long term (current) use of insulin: Secondary | ICD-10-CM | POA: Diagnosis not present

## 2020-03-21 DIAGNOSIS — N186 End stage renal disease: Secondary | ICD-10-CM | POA: Diagnosis not present

## 2020-03-21 DIAGNOSIS — Z992 Dependence on renal dialysis: Secondary | ICD-10-CM | POA: Diagnosis not present

## 2020-03-21 DIAGNOSIS — N2581 Secondary hyperparathyroidism of renal origin: Secondary | ICD-10-CM | POA: Diagnosis not present

## 2020-03-21 DIAGNOSIS — D509 Iron deficiency anemia, unspecified: Secondary | ICD-10-CM | POA: Diagnosis not present

## 2020-03-22 DIAGNOSIS — N2581 Secondary hyperparathyroidism of renal origin: Secondary | ICD-10-CM | POA: Diagnosis not present

## 2020-03-22 DIAGNOSIS — N186 End stage renal disease: Secondary | ICD-10-CM | POA: Diagnosis not present

## 2020-03-22 DIAGNOSIS — D509 Iron deficiency anemia, unspecified: Secondary | ICD-10-CM | POA: Diagnosis not present

## 2020-03-22 DIAGNOSIS — R519 Headache, unspecified: Secondary | ICD-10-CM | POA: Diagnosis not present

## 2020-03-22 DIAGNOSIS — Z992 Dependence on renal dialysis: Secondary | ICD-10-CM | POA: Diagnosis not present

## 2020-03-22 DIAGNOSIS — Z794 Long term (current) use of insulin: Secondary | ICD-10-CM | POA: Diagnosis not present

## 2020-03-23 DIAGNOSIS — D509 Iron deficiency anemia, unspecified: Secondary | ICD-10-CM | POA: Diagnosis not present

## 2020-03-23 DIAGNOSIS — R519 Headache, unspecified: Secondary | ICD-10-CM | POA: Diagnosis not present

## 2020-03-23 DIAGNOSIS — T8610 Unspecified complication of kidney transplant: Secondary | ICD-10-CM | POA: Diagnosis not present

## 2020-03-23 DIAGNOSIS — Z794 Long term (current) use of insulin: Secondary | ICD-10-CM | POA: Diagnosis not present

## 2020-03-23 DIAGNOSIS — R252 Cramp and spasm: Secondary | ICD-10-CM | POA: Diagnosis not present

## 2020-03-23 DIAGNOSIS — E1122 Type 2 diabetes mellitus with diabetic chronic kidney disease: Secondary | ICD-10-CM | POA: Diagnosis not present

## 2020-03-23 DIAGNOSIS — Z992 Dependence on renal dialysis: Secondary | ICD-10-CM | POA: Diagnosis not present

## 2020-03-23 DIAGNOSIS — Z23 Encounter for immunization: Secondary | ICD-10-CM | POA: Diagnosis not present

## 2020-03-23 DIAGNOSIS — I120 Hypertensive chronic kidney disease with stage 5 chronic kidney disease or end stage renal disease: Secondary | ICD-10-CM | POA: Diagnosis not present

## 2020-03-23 DIAGNOSIS — I953 Hypotension of hemodialysis: Secondary | ICD-10-CM | POA: Diagnosis not present

## 2020-03-23 DIAGNOSIS — N2581 Secondary hyperparathyroidism of renal origin: Secondary | ICD-10-CM | POA: Diagnosis not present

## 2020-03-23 DIAGNOSIS — D631 Anemia in chronic kidney disease: Secondary | ICD-10-CM | POA: Diagnosis not present

## 2020-03-23 DIAGNOSIS — N186 End stage renal disease: Secondary | ICD-10-CM | POA: Diagnosis not present

## 2020-03-24 DIAGNOSIS — Z23 Encounter for immunization: Secondary | ICD-10-CM | POA: Diagnosis not present

## 2020-03-26 DIAGNOSIS — N186 End stage renal disease: Secondary | ICD-10-CM | POA: Diagnosis not present

## 2020-03-26 DIAGNOSIS — D509 Iron deficiency anemia, unspecified: Secondary | ICD-10-CM | POA: Diagnosis not present

## 2020-03-26 DIAGNOSIS — D631 Anemia in chronic kidney disease: Secondary | ICD-10-CM | POA: Diagnosis not present

## 2020-03-26 DIAGNOSIS — N2581 Secondary hyperparathyroidism of renal origin: Secondary | ICD-10-CM | POA: Diagnosis not present

## 2020-03-26 DIAGNOSIS — Z992 Dependence on renal dialysis: Secondary | ICD-10-CM | POA: Diagnosis not present

## 2020-03-28 DIAGNOSIS — Z992 Dependence on renal dialysis: Secondary | ICD-10-CM | POA: Diagnosis not present

## 2020-03-28 DIAGNOSIS — D509 Iron deficiency anemia, unspecified: Secondary | ICD-10-CM | POA: Diagnosis not present

## 2020-03-28 DIAGNOSIS — N186 End stage renal disease: Secondary | ICD-10-CM | POA: Diagnosis not present

## 2020-03-28 DIAGNOSIS — D631 Anemia in chronic kidney disease: Secondary | ICD-10-CM | POA: Diagnosis not present

## 2020-03-28 DIAGNOSIS — N2581 Secondary hyperparathyroidism of renal origin: Secondary | ICD-10-CM | POA: Diagnosis not present

## 2020-03-29 DIAGNOSIS — D509 Iron deficiency anemia, unspecified: Secondary | ICD-10-CM | POA: Diagnosis not present

## 2020-03-29 DIAGNOSIS — Z992 Dependence on renal dialysis: Secondary | ICD-10-CM | POA: Diagnosis not present

## 2020-03-29 DIAGNOSIS — N2581 Secondary hyperparathyroidism of renal origin: Secondary | ICD-10-CM | POA: Diagnosis not present

## 2020-03-29 DIAGNOSIS — D631 Anemia in chronic kidney disease: Secondary | ICD-10-CM | POA: Diagnosis not present

## 2020-03-29 DIAGNOSIS — N186 End stage renal disease: Secondary | ICD-10-CM | POA: Diagnosis not present

## 2020-03-30 DIAGNOSIS — Z992 Dependence on renal dialysis: Secondary | ICD-10-CM | POA: Diagnosis not present

## 2020-03-30 DIAGNOSIS — N2581 Secondary hyperparathyroidism of renal origin: Secondary | ICD-10-CM | POA: Diagnosis not present

## 2020-03-30 DIAGNOSIS — N186 End stage renal disease: Secondary | ICD-10-CM | POA: Diagnosis not present

## 2020-03-30 DIAGNOSIS — D509 Iron deficiency anemia, unspecified: Secondary | ICD-10-CM | POA: Diagnosis not present

## 2020-03-30 DIAGNOSIS — D631 Anemia in chronic kidney disease: Secondary | ICD-10-CM | POA: Diagnosis not present

## 2020-04-02 DIAGNOSIS — N2581 Secondary hyperparathyroidism of renal origin: Secondary | ICD-10-CM | POA: Diagnosis not present

## 2020-04-02 DIAGNOSIS — N186 End stage renal disease: Secondary | ICD-10-CM | POA: Diagnosis not present

## 2020-04-02 DIAGNOSIS — Z992 Dependence on renal dialysis: Secondary | ICD-10-CM | POA: Diagnosis not present

## 2020-04-02 DIAGNOSIS — D631 Anemia in chronic kidney disease: Secondary | ICD-10-CM | POA: Diagnosis not present

## 2020-04-02 DIAGNOSIS — D509 Iron deficiency anemia, unspecified: Secondary | ICD-10-CM | POA: Diagnosis not present

## 2020-04-04 DIAGNOSIS — N186 End stage renal disease: Secondary | ICD-10-CM | POA: Diagnosis not present

## 2020-04-04 DIAGNOSIS — N2581 Secondary hyperparathyroidism of renal origin: Secondary | ICD-10-CM | POA: Diagnosis not present

## 2020-04-04 DIAGNOSIS — D509 Iron deficiency anemia, unspecified: Secondary | ICD-10-CM | POA: Diagnosis not present

## 2020-04-04 DIAGNOSIS — Z992 Dependence on renal dialysis: Secondary | ICD-10-CM | POA: Diagnosis not present

## 2020-04-04 DIAGNOSIS — D631 Anemia in chronic kidney disease: Secondary | ICD-10-CM | POA: Diagnosis not present

## 2020-04-05 DIAGNOSIS — N186 End stage renal disease: Secondary | ICD-10-CM | POA: Diagnosis not present

## 2020-04-05 DIAGNOSIS — D509 Iron deficiency anemia, unspecified: Secondary | ICD-10-CM | POA: Diagnosis not present

## 2020-04-05 DIAGNOSIS — D631 Anemia in chronic kidney disease: Secondary | ICD-10-CM | POA: Diagnosis not present

## 2020-04-05 DIAGNOSIS — Z992 Dependence on renal dialysis: Secondary | ICD-10-CM | POA: Diagnosis not present

## 2020-04-05 DIAGNOSIS — N2581 Secondary hyperparathyroidism of renal origin: Secondary | ICD-10-CM | POA: Diagnosis not present

## 2020-04-06 DIAGNOSIS — N186 End stage renal disease: Secondary | ICD-10-CM | POA: Diagnosis not present

## 2020-04-06 DIAGNOSIS — N2581 Secondary hyperparathyroidism of renal origin: Secondary | ICD-10-CM | POA: Diagnosis not present

## 2020-04-06 DIAGNOSIS — Z992 Dependence on renal dialysis: Secondary | ICD-10-CM | POA: Diagnosis not present

## 2020-04-06 DIAGNOSIS — D509 Iron deficiency anemia, unspecified: Secondary | ICD-10-CM | POA: Diagnosis not present

## 2020-04-06 DIAGNOSIS — D631 Anemia in chronic kidney disease: Secondary | ICD-10-CM | POA: Diagnosis not present

## 2020-04-06 DIAGNOSIS — I10 Essential (primary) hypertension: Secondary | ICD-10-CM | POA: Diagnosis not present

## 2020-04-09 DIAGNOSIS — D509 Iron deficiency anemia, unspecified: Secondary | ICD-10-CM | POA: Diagnosis not present

## 2020-04-09 DIAGNOSIS — Z992 Dependence on renal dialysis: Secondary | ICD-10-CM | POA: Diagnosis not present

## 2020-04-09 DIAGNOSIS — N186 End stage renal disease: Secondary | ICD-10-CM | POA: Diagnosis not present

## 2020-04-09 DIAGNOSIS — D631 Anemia in chronic kidney disease: Secondary | ICD-10-CM | POA: Diagnosis not present

## 2020-04-09 DIAGNOSIS — N2581 Secondary hyperparathyroidism of renal origin: Secondary | ICD-10-CM | POA: Diagnosis not present

## 2020-04-11 DIAGNOSIS — N186 End stage renal disease: Secondary | ICD-10-CM | POA: Diagnosis not present

## 2020-04-11 DIAGNOSIS — D631 Anemia in chronic kidney disease: Secondary | ICD-10-CM | POA: Diagnosis not present

## 2020-04-11 DIAGNOSIS — N2581 Secondary hyperparathyroidism of renal origin: Secondary | ICD-10-CM | POA: Diagnosis not present

## 2020-04-11 DIAGNOSIS — D509 Iron deficiency anemia, unspecified: Secondary | ICD-10-CM | POA: Diagnosis not present

## 2020-04-11 DIAGNOSIS — Z992 Dependence on renal dialysis: Secondary | ICD-10-CM | POA: Diagnosis not present

## 2020-04-12 DIAGNOSIS — Z992 Dependence on renal dialysis: Secondary | ICD-10-CM | POA: Diagnosis not present

## 2020-04-12 DIAGNOSIS — D631 Anemia in chronic kidney disease: Secondary | ICD-10-CM | POA: Diagnosis not present

## 2020-04-12 DIAGNOSIS — D509 Iron deficiency anemia, unspecified: Secondary | ICD-10-CM | POA: Diagnosis not present

## 2020-04-12 DIAGNOSIS — N186 End stage renal disease: Secondary | ICD-10-CM | POA: Diagnosis not present

## 2020-04-12 DIAGNOSIS — N2581 Secondary hyperparathyroidism of renal origin: Secondary | ICD-10-CM | POA: Diagnosis not present

## 2020-04-13 DIAGNOSIS — Z992 Dependence on renal dialysis: Secondary | ICD-10-CM | POA: Diagnosis not present

## 2020-04-13 DIAGNOSIS — D509 Iron deficiency anemia, unspecified: Secondary | ICD-10-CM | POA: Diagnosis not present

## 2020-04-13 DIAGNOSIS — N2581 Secondary hyperparathyroidism of renal origin: Secondary | ICD-10-CM | POA: Diagnosis not present

## 2020-04-13 DIAGNOSIS — N186 End stage renal disease: Secondary | ICD-10-CM | POA: Diagnosis not present

## 2020-04-13 DIAGNOSIS — D631 Anemia in chronic kidney disease: Secondary | ICD-10-CM | POA: Diagnosis not present

## 2020-04-16 DIAGNOSIS — D509 Iron deficiency anemia, unspecified: Secondary | ICD-10-CM | POA: Diagnosis not present

## 2020-04-16 DIAGNOSIS — Z992 Dependence on renal dialysis: Secondary | ICD-10-CM | POA: Diagnosis not present

## 2020-04-16 DIAGNOSIS — N2581 Secondary hyperparathyroidism of renal origin: Secondary | ICD-10-CM | POA: Diagnosis not present

## 2020-04-16 DIAGNOSIS — D631 Anemia in chronic kidney disease: Secondary | ICD-10-CM | POA: Diagnosis not present

## 2020-04-16 DIAGNOSIS — N186 End stage renal disease: Secondary | ICD-10-CM | POA: Diagnosis not present

## 2020-04-18 DIAGNOSIS — D509 Iron deficiency anemia, unspecified: Secondary | ICD-10-CM | POA: Diagnosis not present

## 2020-04-18 DIAGNOSIS — N2581 Secondary hyperparathyroidism of renal origin: Secondary | ICD-10-CM | POA: Diagnosis not present

## 2020-04-18 DIAGNOSIS — N186 End stage renal disease: Secondary | ICD-10-CM | POA: Diagnosis not present

## 2020-04-18 DIAGNOSIS — D631 Anemia in chronic kidney disease: Secondary | ICD-10-CM | POA: Diagnosis not present

## 2020-04-18 DIAGNOSIS — Z992 Dependence on renal dialysis: Secondary | ICD-10-CM | POA: Diagnosis not present

## 2020-04-19 DIAGNOSIS — D509 Iron deficiency anemia, unspecified: Secondary | ICD-10-CM | POA: Diagnosis not present

## 2020-04-19 DIAGNOSIS — D631 Anemia in chronic kidney disease: Secondary | ICD-10-CM | POA: Diagnosis not present

## 2020-04-19 DIAGNOSIS — N186 End stage renal disease: Secondary | ICD-10-CM | POA: Diagnosis not present

## 2020-04-19 DIAGNOSIS — N2581 Secondary hyperparathyroidism of renal origin: Secondary | ICD-10-CM | POA: Diagnosis not present

## 2020-04-19 DIAGNOSIS — Z992 Dependence on renal dialysis: Secondary | ICD-10-CM | POA: Diagnosis not present

## 2020-04-20 DIAGNOSIS — Z992 Dependence on renal dialysis: Secondary | ICD-10-CM | POA: Diagnosis not present

## 2020-04-20 DIAGNOSIS — D631 Anemia in chronic kidney disease: Secondary | ICD-10-CM | POA: Diagnosis not present

## 2020-04-20 DIAGNOSIS — N2581 Secondary hyperparathyroidism of renal origin: Secondary | ICD-10-CM | POA: Diagnosis not present

## 2020-04-20 DIAGNOSIS — D509 Iron deficiency anemia, unspecified: Secondary | ICD-10-CM | POA: Diagnosis not present

## 2020-04-20 DIAGNOSIS — N186 End stage renal disease: Secondary | ICD-10-CM | POA: Diagnosis not present

## 2020-04-23 DIAGNOSIS — R519 Headache, unspecified: Secondary | ICD-10-CM | POA: Diagnosis not present

## 2020-04-23 DIAGNOSIS — D509 Iron deficiency anemia, unspecified: Secondary | ICD-10-CM | POA: Diagnosis not present

## 2020-04-23 DIAGNOSIS — Z794 Long term (current) use of insulin: Secondary | ICD-10-CM | POA: Diagnosis not present

## 2020-04-23 DIAGNOSIS — N2581 Secondary hyperparathyroidism of renal origin: Secondary | ICD-10-CM | POA: Diagnosis not present

## 2020-04-23 DIAGNOSIS — N186 End stage renal disease: Secondary | ICD-10-CM | POA: Diagnosis not present

## 2020-04-23 DIAGNOSIS — Z992 Dependence on renal dialysis: Secondary | ICD-10-CM | POA: Diagnosis not present

## 2020-04-23 DIAGNOSIS — Z4931 Encounter for adequacy testing for hemodialysis: Secondary | ICD-10-CM | POA: Diagnosis not present

## 2020-04-23 DIAGNOSIS — I953 Hypotension of hemodialysis: Secondary | ICD-10-CM | POA: Diagnosis not present

## 2020-04-23 DIAGNOSIS — R252 Cramp and spasm: Secondary | ICD-10-CM | POA: Diagnosis not present

## 2020-04-23 DIAGNOSIS — I120 Hypertensive chronic kidney disease with stage 5 chronic kidney disease or end stage renal disease: Secondary | ICD-10-CM | POA: Diagnosis not present

## 2020-04-23 DIAGNOSIS — E1122 Type 2 diabetes mellitus with diabetic chronic kidney disease: Secondary | ICD-10-CM | POA: Diagnosis not present

## 2020-04-25 DIAGNOSIS — N2581 Secondary hyperparathyroidism of renal origin: Secondary | ICD-10-CM | POA: Diagnosis not present

## 2020-04-25 DIAGNOSIS — N186 End stage renal disease: Secondary | ICD-10-CM | POA: Diagnosis not present

## 2020-04-25 DIAGNOSIS — Z992 Dependence on renal dialysis: Secondary | ICD-10-CM | POA: Diagnosis not present

## 2020-04-25 DIAGNOSIS — Z4931 Encounter for adequacy testing for hemodialysis: Secondary | ICD-10-CM | POA: Diagnosis not present

## 2020-04-25 DIAGNOSIS — Z794 Long term (current) use of insulin: Secondary | ICD-10-CM | POA: Diagnosis not present

## 2020-04-25 DIAGNOSIS — D509 Iron deficiency anemia, unspecified: Secondary | ICD-10-CM | POA: Diagnosis not present

## 2020-04-26 DIAGNOSIS — Z992 Dependence on renal dialysis: Secondary | ICD-10-CM | POA: Diagnosis not present

## 2020-04-26 DIAGNOSIS — N186 End stage renal disease: Secondary | ICD-10-CM | POA: Diagnosis not present

## 2020-04-26 DIAGNOSIS — N2581 Secondary hyperparathyroidism of renal origin: Secondary | ICD-10-CM | POA: Diagnosis not present

## 2020-04-26 DIAGNOSIS — Z794 Long term (current) use of insulin: Secondary | ICD-10-CM | POA: Diagnosis not present

## 2020-04-26 DIAGNOSIS — Z4931 Encounter for adequacy testing for hemodialysis: Secondary | ICD-10-CM | POA: Diagnosis not present

## 2020-04-26 DIAGNOSIS — D509 Iron deficiency anemia, unspecified: Secondary | ICD-10-CM | POA: Diagnosis not present

## 2020-04-27 DIAGNOSIS — Z794 Long term (current) use of insulin: Secondary | ICD-10-CM | POA: Diagnosis not present

## 2020-04-27 DIAGNOSIS — N186 End stage renal disease: Secondary | ICD-10-CM | POA: Diagnosis not present

## 2020-04-27 DIAGNOSIS — D509 Iron deficiency anemia, unspecified: Secondary | ICD-10-CM | POA: Diagnosis not present

## 2020-04-27 DIAGNOSIS — Z992 Dependence on renal dialysis: Secondary | ICD-10-CM | POA: Diagnosis not present

## 2020-04-27 DIAGNOSIS — N2581 Secondary hyperparathyroidism of renal origin: Secondary | ICD-10-CM | POA: Diagnosis not present

## 2020-04-27 DIAGNOSIS — Z4931 Encounter for adequacy testing for hemodialysis: Secondary | ICD-10-CM | POA: Diagnosis not present

## 2020-04-30 DIAGNOSIS — Z4931 Encounter for adequacy testing for hemodialysis: Secondary | ICD-10-CM | POA: Diagnosis not present

## 2020-04-30 DIAGNOSIS — N186 End stage renal disease: Secondary | ICD-10-CM | POA: Diagnosis not present

## 2020-04-30 DIAGNOSIS — Z992 Dependence on renal dialysis: Secondary | ICD-10-CM | POA: Diagnosis not present

## 2020-04-30 DIAGNOSIS — Z794 Long term (current) use of insulin: Secondary | ICD-10-CM | POA: Diagnosis not present

## 2020-04-30 DIAGNOSIS — D509 Iron deficiency anemia, unspecified: Secondary | ICD-10-CM | POA: Diagnosis not present

## 2020-04-30 DIAGNOSIS — N2581 Secondary hyperparathyroidism of renal origin: Secondary | ICD-10-CM | POA: Diagnosis not present

## 2020-05-02 DIAGNOSIS — Z4931 Encounter for adequacy testing for hemodialysis: Secondary | ICD-10-CM | POA: Diagnosis not present

## 2020-05-02 DIAGNOSIS — N186 End stage renal disease: Secondary | ICD-10-CM | POA: Diagnosis not present

## 2020-05-02 DIAGNOSIS — D509 Iron deficiency anemia, unspecified: Secondary | ICD-10-CM | POA: Diagnosis not present

## 2020-05-02 DIAGNOSIS — N2581 Secondary hyperparathyroidism of renal origin: Secondary | ICD-10-CM | POA: Diagnosis not present

## 2020-05-02 DIAGNOSIS — Z794 Long term (current) use of insulin: Secondary | ICD-10-CM | POA: Diagnosis not present

## 2020-05-02 DIAGNOSIS — Z992 Dependence on renal dialysis: Secondary | ICD-10-CM | POA: Diagnosis not present

## 2020-05-03 DIAGNOSIS — N186 End stage renal disease: Secondary | ICD-10-CM | POA: Diagnosis not present

## 2020-05-03 DIAGNOSIS — D509 Iron deficiency anemia, unspecified: Secondary | ICD-10-CM | POA: Diagnosis not present

## 2020-05-03 DIAGNOSIS — Z992 Dependence on renal dialysis: Secondary | ICD-10-CM | POA: Diagnosis not present

## 2020-05-03 DIAGNOSIS — N2581 Secondary hyperparathyroidism of renal origin: Secondary | ICD-10-CM | POA: Diagnosis not present

## 2020-05-03 DIAGNOSIS — Z794 Long term (current) use of insulin: Secondary | ICD-10-CM | POA: Diagnosis not present

## 2020-05-03 DIAGNOSIS — Z4931 Encounter for adequacy testing for hemodialysis: Secondary | ICD-10-CM | POA: Diagnosis not present

## 2020-05-04 DIAGNOSIS — Z992 Dependence on renal dialysis: Secondary | ICD-10-CM | POA: Diagnosis not present

## 2020-05-04 DIAGNOSIS — Z794 Long term (current) use of insulin: Secondary | ICD-10-CM | POA: Diagnosis not present

## 2020-05-04 DIAGNOSIS — N2581 Secondary hyperparathyroidism of renal origin: Secondary | ICD-10-CM | POA: Diagnosis not present

## 2020-05-04 DIAGNOSIS — D509 Iron deficiency anemia, unspecified: Secondary | ICD-10-CM | POA: Diagnosis not present

## 2020-05-04 DIAGNOSIS — N186 End stage renal disease: Secondary | ICD-10-CM | POA: Diagnosis not present

## 2020-05-04 DIAGNOSIS — Z4931 Encounter for adequacy testing for hemodialysis: Secondary | ICD-10-CM | POA: Diagnosis not present

## 2020-05-07 DIAGNOSIS — Z992 Dependence on renal dialysis: Secondary | ICD-10-CM | POA: Diagnosis not present

## 2020-05-07 DIAGNOSIS — D509 Iron deficiency anemia, unspecified: Secondary | ICD-10-CM | POA: Diagnosis not present

## 2020-05-07 DIAGNOSIS — N186 End stage renal disease: Secondary | ICD-10-CM | POA: Diagnosis not present

## 2020-05-07 DIAGNOSIS — Z794 Long term (current) use of insulin: Secondary | ICD-10-CM | POA: Diagnosis not present

## 2020-05-07 DIAGNOSIS — Z4931 Encounter for adequacy testing for hemodialysis: Secondary | ICD-10-CM | POA: Diagnosis not present

## 2020-05-07 DIAGNOSIS — N2581 Secondary hyperparathyroidism of renal origin: Secondary | ICD-10-CM | POA: Diagnosis not present

## 2020-05-09 DIAGNOSIS — Z4931 Encounter for adequacy testing for hemodialysis: Secondary | ICD-10-CM | POA: Diagnosis not present

## 2020-05-09 DIAGNOSIS — D509 Iron deficiency anemia, unspecified: Secondary | ICD-10-CM | POA: Diagnosis not present

## 2020-05-09 DIAGNOSIS — N186 End stage renal disease: Secondary | ICD-10-CM | POA: Diagnosis not present

## 2020-05-09 DIAGNOSIS — Z794 Long term (current) use of insulin: Secondary | ICD-10-CM | POA: Diagnosis not present

## 2020-05-09 DIAGNOSIS — N2581 Secondary hyperparathyroidism of renal origin: Secondary | ICD-10-CM | POA: Diagnosis not present

## 2020-05-09 DIAGNOSIS — Z992 Dependence on renal dialysis: Secondary | ICD-10-CM | POA: Diagnosis not present

## 2020-05-10 DIAGNOSIS — N2581 Secondary hyperparathyroidism of renal origin: Secondary | ICD-10-CM | POA: Diagnosis not present

## 2020-05-10 DIAGNOSIS — Z4931 Encounter for adequacy testing for hemodialysis: Secondary | ICD-10-CM | POA: Diagnosis not present

## 2020-05-10 DIAGNOSIS — Z992 Dependence on renal dialysis: Secondary | ICD-10-CM | POA: Diagnosis not present

## 2020-05-10 DIAGNOSIS — Z794 Long term (current) use of insulin: Secondary | ICD-10-CM | POA: Diagnosis not present

## 2020-05-10 DIAGNOSIS — D509 Iron deficiency anemia, unspecified: Secondary | ICD-10-CM | POA: Diagnosis not present

## 2020-05-10 DIAGNOSIS — N186 End stage renal disease: Secondary | ICD-10-CM | POA: Diagnosis not present

## 2020-05-11 DIAGNOSIS — Z4931 Encounter for adequacy testing for hemodialysis: Secondary | ICD-10-CM | POA: Diagnosis not present

## 2020-05-11 DIAGNOSIS — Z794 Long term (current) use of insulin: Secondary | ICD-10-CM | POA: Diagnosis not present

## 2020-05-11 DIAGNOSIS — N186 End stage renal disease: Secondary | ICD-10-CM | POA: Diagnosis not present

## 2020-05-11 DIAGNOSIS — D509 Iron deficiency anemia, unspecified: Secondary | ICD-10-CM | POA: Diagnosis not present

## 2020-05-11 DIAGNOSIS — N2581 Secondary hyperparathyroidism of renal origin: Secondary | ICD-10-CM | POA: Diagnosis not present

## 2020-05-11 DIAGNOSIS — Z992 Dependence on renal dialysis: Secondary | ICD-10-CM | POA: Diagnosis not present

## 2020-05-14 DIAGNOSIS — D509 Iron deficiency anemia, unspecified: Secondary | ICD-10-CM | POA: Diagnosis not present

## 2020-05-14 DIAGNOSIS — Z4931 Encounter for adequacy testing for hemodialysis: Secondary | ICD-10-CM | POA: Diagnosis not present

## 2020-05-14 DIAGNOSIS — Z992 Dependence on renal dialysis: Secondary | ICD-10-CM | POA: Diagnosis not present

## 2020-05-14 DIAGNOSIS — Z794 Long term (current) use of insulin: Secondary | ICD-10-CM | POA: Diagnosis not present

## 2020-05-14 DIAGNOSIS — N2581 Secondary hyperparathyroidism of renal origin: Secondary | ICD-10-CM | POA: Diagnosis not present

## 2020-05-14 DIAGNOSIS — N186 End stage renal disease: Secondary | ICD-10-CM | POA: Diagnosis not present

## 2020-05-16 DIAGNOSIS — R82998 Other abnormal findings in urine: Secondary | ICD-10-CM | POA: Diagnosis not present

## 2020-05-16 DIAGNOSIS — N186 End stage renal disease: Secondary | ICD-10-CM | POA: Diagnosis not present

## 2020-05-16 DIAGNOSIS — Z4931 Encounter for adequacy testing for hemodialysis: Secondary | ICD-10-CM | POA: Diagnosis not present

## 2020-05-16 DIAGNOSIS — N2581 Secondary hyperparathyroidism of renal origin: Secondary | ICD-10-CM | POA: Diagnosis not present

## 2020-05-16 DIAGNOSIS — Z992 Dependence on renal dialysis: Secondary | ICD-10-CM | POA: Diagnosis not present

## 2020-05-16 DIAGNOSIS — Z794 Long term (current) use of insulin: Secondary | ICD-10-CM | POA: Diagnosis not present

## 2020-05-16 DIAGNOSIS — D509 Iron deficiency anemia, unspecified: Secondary | ICD-10-CM | POA: Diagnosis not present

## 2020-05-17 DIAGNOSIS — D509 Iron deficiency anemia, unspecified: Secondary | ICD-10-CM | POA: Diagnosis not present

## 2020-05-17 DIAGNOSIS — N186 End stage renal disease: Secondary | ICD-10-CM | POA: Diagnosis not present

## 2020-05-17 DIAGNOSIS — Z794 Long term (current) use of insulin: Secondary | ICD-10-CM | POA: Diagnosis not present

## 2020-05-17 DIAGNOSIS — Z4931 Encounter for adequacy testing for hemodialysis: Secondary | ICD-10-CM | POA: Diagnosis not present

## 2020-05-17 DIAGNOSIS — N2581 Secondary hyperparathyroidism of renal origin: Secondary | ICD-10-CM | POA: Diagnosis not present

## 2020-05-17 DIAGNOSIS — Z992 Dependence on renal dialysis: Secondary | ICD-10-CM | POA: Diagnosis not present

## 2020-05-18 DIAGNOSIS — N186 End stage renal disease: Secondary | ICD-10-CM | POA: Diagnosis not present

## 2020-05-18 DIAGNOSIS — Z4931 Encounter for adequacy testing for hemodialysis: Secondary | ICD-10-CM | POA: Diagnosis not present

## 2020-05-18 DIAGNOSIS — D509 Iron deficiency anemia, unspecified: Secondary | ICD-10-CM | POA: Diagnosis not present

## 2020-05-18 DIAGNOSIS — Z794 Long term (current) use of insulin: Secondary | ICD-10-CM | POA: Diagnosis not present

## 2020-05-18 DIAGNOSIS — Z992 Dependence on renal dialysis: Secondary | ICD-10-CM | POA: Diagnosis not present

## 2020-05-18 DIAGNOSIS — N2581 Secondary hyperparathyroidism of renal origin: Secondary | ICD-10-CM | POA: Diagnosis not present

## 2020-05-21 DIAGNOSIS — D509 Iron deficiency anemia, unspecified: Secondary | ICD-10-CM | POA: Diagnosis not present

## 2020-05-21 DIAGNOSIS — Z794 Long term (current) use of insulin: Secondary | ICD-10-CM | POA: Diagnosis not present

## 2020-05-21 DIAGNOSIS — N186 End stage renal disease: Secondary | ICD-10-CM | POA: Diagnosis not present

## 2020-05-21 DIAGNOSIS — N2581 Secondary hyperparathyroidism of renal origin: Secondary | ICD-10-CM | POA: Diagnosis not present

## 2020-05-21 DIAGNOSIS — Z4931 Encounter for adequacy testing for hemodialysis: Secondary | ICD-10-CM | POA: Diagnosis not present

## 2020-05-21 DIAGNOSIS — M10379 Gout due to renal impairment, unspecified ankle and foot: Secondary | ICD-10-CM | POA: Insufficient documentation

## 2020-05-21 DIAGNOSIS — Z992 Dependence on renal dialysis: Secondary | ICD-10-CM | POA: Diagnosis not present

## 2020-05-23 DIAGNOSIS — N2581 Secondary hyperparathyroidism of renal origin: Secondary | ICD-10-CM | POA: Diagnosis not present

## 2020-05-23 DIAGNOSIS — D631 Anemia in chronic kidney disease: Secondary | ICD-10-CM | POA: Diagnosis not present

## 2020-05-23 DIAGNOSIS — R519 Headache, unspecified: Secondary | ICD-10-CM | POA: Diagnosis not present

## 2020-05-23 DIAGNOSIS — R252 Cramp and spasm: Secondary | ICD-10-CM | POA: Diagnosis not present

## 2020-05-23 DIAGNOSIS — T8610 Unspecified complication of kidney transplant: Secondary | ICD-10-CM | POA: Diagnosis not present

## 2020-05-23 DIAGNOSIS — D509 Iron deficiency anemia, unspecified: Secondary | ICD-10-CM | POA: Diagnosis not present

## 2020-05-23 DIAGNOSIS — Z992 Dependence on renal dialysis: Secondary | ICD-10-CM | POA: Diagnosis not present

## 2020-05-23 DIAGNOSIS — N186 End stage renal disease: Secondary | ICD-10-CM | POA: Diagnosis not present

## 2020-05-23 DIAGNOSIS — Z794 Long term (current) use of insulin: Secondary | ICD-10-CM | POA: Diagnosis not present

## 2020-05-23 DIAGNOSIS — E46 Unspecified protein-calorie malnutrition: Secondary | ICD-10-CM | POA: Diagnosis not present

## 2020-05-23 DIAGNOSIS — Z4931 Encounter for adequacy testing for hemodialysis: Secondary | ICD-10-CM | POA: Diagnosis not present

## 2020-05-23 DIAGNOSIS — I953 Hypotension of hemodialysis: Secondary | ICD-10-CM | POA: Diagnosis not present

## 2020-05-24 DIAGNOSIS — N186 End stage renal disease: Secondary | ICD-10-CM | POA: Diagnosis not present

## 2020-05-24 DIAGNOSIS — D631 Anemia in chronic kidney disease: Secondary | ICD-10-CM | POA: Diagnosis not present

## 2020-05-24 DIAGNOSIS — N2581 Secondary hyperparathyroidism of renal origin: Secondary | ICD-10-CM | POA: Diagnosis not present

## 2020-05-24 DIAGNOSIS — Z992 Dependence on renal dialysis: Secondary | ICD-10-CM | POA: Diagnosis not present

## 2020-05-24 DIAGNOSIS — Z4931 Encounter for adequacy testing for hemodialysis: Secondary | ICD-10-CM | POA: Diagnosis not present

## 2020-05-25 DIAGNOSIS — D631 Anemia in chronic kidney disease: Secondary | ICD-10-CM | POA: Diagnosis not present

## 2020-05-25 DIAGNOSIS — N186 End stage renal disease: Secondary | ICD-10-CM | POA: Diagnosis not present

## 2020-05-25 DIAGNOSIS — N2581 Secondary hyperparathyroidism of renal origin: Secondary | ICD-10-CM | POA: Diagnosis not present

## 2020-05-25 DIAGNOSIS — Z4931 Encounter for adequacy testing for hemodialysis: Secondary | ICD-10-CM | POA: Diagnosis not present

## 2020-05-25 DIAGNOSIS — Z992 Dependence on renal dialysis: Secondary | ICD-10-CM | POA: Diagnosis not present

## 2020-05-28 DIAGNOSIS — Z992 Dependence on renal dialysis: Secondary | ICD-10-CM | POA: Diagnosis not present

## 2020-05-28 DIAGNOSIS — N186 End stage renal disease: Secondary | ICD-10-CM | POA: Diagnosis not present

## 2020-05-28 DIAGNOSIS — D631 Anemia in chronic kidney disease: Secondary | ICD-10-CM | POA: Diagnosis not present

## 2020-05-28 DIAGNOSIS — Z4931 Encounter for adequacy testing for hemodialysis: Secondary | ICD-10-CM | POA: Diagnosis not present

## 2020-05-28 DIAGNOSIS — N2581 Secondary hyperparathyroidism of renal origin: Secondary | ICD-10-CM | POA: Diagnosis not present

## 2020-05-30 DIAGNOSIS — N2581 Secondary hyperparathyroidism of renal origin: Secondary | ICD-10-CM | POA: Diagnosis not present

## 2020-05-30 DIAGNOSIS — Z992 Dependence on renal dialysis: Secondary | ICD-10-CM | POA: Diagnosis not present

## 2020-05-30 DIAGNOSIS — Z4931 Encounter for adequacy testing for hemodialysis: Secondary | ICD-10-CM | POA: Diagnosis not present

## 2020-05-30 DIAGNOSIS — D631 Anemia in chronic kidney disease: Secondary | ICD-10-CM | POA: Diagnosis not present

## 2020-05-30 DIAGNOSIS — N186 End stage renal disease: Secondary | ICD-10-CM | POA: Diagnosis not present

## 2020-05-31 DIAGNOSIS — Z4931 Encounter for adequacy testing for hemodialysis: Secondary | ICD-10-CM | POA: Diagnosis not present

## 2020-05-31 DIAGNOSIS — N2581 Secondary hyperparathyroidism of renal origin: Secondary | ICD-10-CM | POA: Diagnosis not present

## 2020-05-31 DIAGNOSIS — Z992 Dependence on renal dialysis: Secondary | ICD-10-CM | POA: Diagnosis not present

## 2020-05-31 DIAGNOSIS — N186 End stage renal disease: Secondary | ICD-10-CM | POA: Diagnosis not present

## 2020-05-31 DIAGNOSIS — D631 Anemia in chronic kidney disease: Secondary | ICD-10-CM | POA: Diagnosis not present

## 2020-06-01 DIAGNOSIS — Z992 Dependence on renal dialysis: Secondary | ICD-10-CM | POA: Diagnosis not present

## 2020-06-01 DIAGNOSIS — N2581 Secondary hyperparathyroidism of renal origin: Secondary | ICD-10-CM | POA: Diagnosis not present

## 2020-06-01 DIAGNOSIS — E042 Nontoxic multinodular goiter: Secondary | ICD-10-CM | POA: Diagnosis not present

## 2020-06-01 DIAGNOSIS — N186 End stage renal disease: Secondary | ICD-10-CM | POA: Diagnosis not present

## 2020-06-01 DIAGNOSIS — Z4931 Encounter for adequacy testing for hemodialysis: Secondary | ICD-10-CM | POA: Diagnosis not present

## 2020-06-01 DIAGNOSIS — R599 Enlarged lymph nodes, unspecified: Secondary | ICD-10-CM | POA: Diagnosis not present

## 2020-06-01 DIAGNOSIS — D631 Anemia in chronic kidney disease: Secondary | ICD-10-CM | POA: Diagnosis not present

## 2020-06-04 ENCOUNTER — Other Ambulatory Visit: Payer: Self-pay | Admitting: Family Medicine

## 2020-06-04 DIAGNOSIS — N2581 Secondary hyperparathyroidism of renal origin: Secondary | ICD-10-CM | POA: Diagnosis not present

## 2020-06-04 DIAGNOSIS — Z4931 Encounter for adequacy testing for hemodialysis: Secondary | ICD-10-CM | POA: Diagnosis not present

## 2020-06-04 DIAGNOSIS — N186 End stage renal disease: Secondary | ICD-10-CM | POA: Diagnosis not present

## 2020-06-04 DIAGNOSIS — Z992 Dependence on renal dialysis: Secondary | ICD-10-CM | POA: Diagnosis not present

## 2020-06-04 DIAGNOSIS — E042 Nontoxic multinodular goiter: Secondary | ICD-10-CM

## 2020-06-04 DIAGNOSIS — D631 Anemia in chronic kidney disease: Secondary | ICD-10-CM | POA: Diagnosis not present

## 2020-06-06 DIAGNOSIS — Z4931 Encounter for adequacy testing for hemodialysis: Secondary | ICD-10-CM | POA: Diagnosis not present

## 2020-06-06 DIAGNOSIS — N2581 Secondary hyperparathyroidism of renal origin: Secondary | ICD-10-CM | POA: Diagnosis not present

## 2020-06-06 DIAGNOSIS — Z992 Dependence on renal dialysis: Secondary | ICD-10-CM | POA: Diagnosis not present

## 2020-06-06 DIAGNOSIS — D631 Anemia in chronic kidney disease: Secondary | ICD-10-CM | POA: Diagnosis not present

## 2020-06-06 DIAGNOSIS — N186 End stage renal disease: Secondary | ICD-10-CM | POA: Diagnosis not present

## 2020-06-07 DIAGNOSIS — N2581 Secondary hyperparathyroidism of renal origin: Secondary | ICD-10-CM | POA: Diagnosis not present

## 2020-06-07 DIAGNOSIS — N186 End stage renal disease: Secondary | ICD-10-CM | POA: Diagnosis not present

## 2020-06-07 DIAGNOSIS — Z992 Dependence on renal dialysis: Secondary | ICD-10-CM | POA: Diagnosis not present

## 2020-06-07 DIAGNOSIS — Z4931 Encounter for adequacy testing for hemodialysis: Secondary | ICD-10-CM | POA: Diagnosis not present

## 2020-06-07 DIAGNOSIS — D631 Anemia in chronic kidney disease: Secondary | ICD-10-CM | POA: Diagnosis not present

## 2020-06-08 DIAGNOSIS — Z4931 Encounter for adequacy testing for hemodialysis: Secondary | ICD-10-CM | POA: Diagnosis not present

## 2020-06-08 DIAGNOSIS — N2581 Secondary hyperparathyroidism of renal origin: Secondary | ICD-10-CM | POA: Diagnosis not present

## 2020-06-08 DIAGNOSIS — D631 Anemia in chronic kidney disease: Secondary | ICD-10-CM | POA: Diagnosis not present

## 2020-06-08 DIAGNOSIS — Z992 Dependence on renal dialysis: Secondary | ICD-10-CM | POA: Diagnosis not present

## 2020-06-08 DIAGNOSIS — N186 End stage renal disease: Secondary | ICD-10-CM | POA: Diagnosis not present

## 2020-06-10 DIAGNOSIS — N2581 Secondary hyperparathyroidism of renal origin: Secondary | ICD-10-CM | POA: Diagnosis not present

## 2020-06-10 DIAGNOSIS — Z4931 Encounter for adequacy testing for hemodialysis: Secondary | ICD-10-CM | POA: Diagnosis not present

## 2020-06-10 DIAGNOSIS — D631 Anemia in chronic kidney disease: Secondary | ICD-10-CM | POA: Diagnosis not present

## 2020-06-10 DIAGNOSIS — N186 End stage renal disease: Secondary | ICD-10-CM | POA: Diagnosis not present

## 2020-06-10 DIAGNOSIS — Z992 Dependence on renal dialysis: Secondary | ICD-10-CM | POA: Diagnosis not present

## 2020-06-11 DIAGNOSIS — N2581 Secondary hyperparathyroidism of renal origin: Secondary | ICD-10-CM | POA: Diagnosis not present

## 2020-06-11 DIAGNOSIS — D631 Anemia in chronic kidney disease: Secondary | ICD-10-CM | POA: Diagnosis not present

## 2020-06-11 DIAGNOSIS — Z4931 Encounter for adequacy testing for hemodialysis: Secondary | ICD-10-CM | POA: Diagnosis not present

## 2020-06-11 DIAGNOSIS — Z992 Dependence on renal dialysis: Secondary | ICD-10-CM | POA: Diagnosis not present

## 2020-06-11 DIAGNOSIS — N186 End stage renal disease: Secondary | ICD-10-CM | POA: Diagnosis not present

## 2020-06-13 DIAGNOSIS — N2581 Secondary hyperparathyroidism of renal origin: Secondary | ICD-10-CM | POA: Diagnosis not present

## 2020-06-13 DIAGNOSIS — Z992 Dependence on renal dialysis: Secondary | ICD-10-CM | POA: Diagnosis not present

## 2020-06-13 DIAGNOSIS — Z4931 Encounter for adequacy testing for hemodialysis: Secondary | ICD-10-CM | POA: Diagnosis not present

## 2020-06-13 DIAGNOSIS — N186 End stage renal disease: Secondary | ICD-10-CM | POA: Diagnosis not present

## 2020-06-13 DIAGNOSIS — D631 Anemia in chronic kidney disease: Secondary | ICD-10-CM | POA: Diagnosis not present

## 2020-06-14 DIAGNOSIS — Z4931 Encounter for adequacy testing for hemodialysis: Secondary | ICD-10-CM | POA: Diagnosis not present

## 2020-06-14 DIAGNOSIS — Z992 Dependence on renal dialysis: Secondary | ICD-10-CM | POA: Diagnosis not present

## 2020-06-14 DIAGNOSIS — N186 End stage renal disease: Secondary | ICD-10-CM | POA: Diagnosis not present

## 2020-06-14 DIAGNOSIS — N2581 Secondary hyperparathyroidism of renal origin: Secondary | ICD-10-CM | POA: Diagnosis not present

## 2020-06-14 DIAGNOSIS — D631 Anemia in chronic kidney disease: Secondary | ICD-10-CM | POA: Diagnosis not present

## 2020-06-15 DIAGNOSIS — N186 End stage renal disease: Secondary | ICD-10-CM | POA: Diagnosis not present

## 2020-06-15 DIAGNOSIS — D631 Anemia in chronic kidney disease: Secondary | ICD-10-CM | POA: Diagnosis not present

## 2020-06-15 DIAGNOSIS — N2581 Secondary hyperparathyroidism of renal origin: Secondary | ICD-10-CM | POA: Diagnosis not present

## 2020-06-15 DIAGNOSIS — Z992 Dependence on renal dialysis: Secondary | ICD-10-CM | POA: Diagnosis not present

## 2020-06-15 DIAGNOSIS — Z4931 Encounter for adequacy testing for hemodialysis: Secondary | ICD-10-CM | POA: Diagnosis not present

## 2020-06-18 DIAGNOSIS — N2581 Secondary hyperparathyroidism of renal origin: Secondary | ICD-10-CM | POA: Diagnosis not present

## 2020-06-18 DIAGNOSIS — Z992 Dependence on renal dialysis: Secondary | ICD-10-CM | POA: Diagnosis not present

## 2020-06-18 DIAGNOSIS — N186 End stage renal disease: Secondary | ICD-10-CM | POA: Diagnosis not present

## 2020-06-18 DIAGNOSIS — D631 Anemia in chronic kidney disease: Secondary | ICD-10-CM | POA: Diagnosis not present

## 2020-06-18 DIAGNOSIS — Z4931 Encounter for adequacy testing for hemodialysis: Secondary | ICD-10-CM | POA: Diagnosis not present

## 2020-06-20 DIAGNOSIS — N186 End stage renal disease: Secondary | ICD-10-CM | POA: Diagnosis not present

## 2020-06-20 DIAGNOSIS — Z992 Dependence on renal dialysis: Secondary | ICD-10-CM | POA: Diagnosis not present

## 2020-06-20 DIAGNOSIS — N2581 Secondary hyperparathyroidism of renal origin: Secondary | ICD-10-CM | POA: Diagnosis not present

## 2020-06-20 DIAGNOSIS — Z4931 Encounter for adequacy testing for hemodialysis: Secondary | ICD-10-CM | POA: Diagnosis not present

## 2020-06-20 DIAGNOSIS — D631 Anemia in chronic kidney disease: Secondary | ICD-10-CM | POA: Diagnosis not present

## 2020-06-21 DIAGNOSIS — D631 Anemia in chronic kidney disease: Secondary | ICD-10-CM | POA: Diagnosis not present

## 2020-06-21 DIAGNOSIS — Z992 Dependence on renal dialysis: Secondary | ICD-10-CM | POA: Diagnosis not present

## 2020-06-21 DIAGNOSIS — Z4931 Encounter for adequacy testing for hemodialysis: Secondary | ICD-10-CM | POA: Diagnosis not present

## 2020-06-21 DIAGNOSIS — N186 End stage renal disease: Secondary | ICD-10-CM | POA: Diagnosis not present

## 2020-06-21 DIAGNOSIS — N2581 Secondary hyperparathyroidism of renal origin: Secondary | ICD-10-CM | POA: Diagnosis not present

## 2020-06-22 DIAGNOSIS — N186 End stage renal disease: Secondary | ICD-10-CM | POA: Diagnosis not present

## 2020-06-22 DIAGNOSIS — D631 Anemia in chronic kidney disease: Secondary | ICD-10-CM | POA: Diagnosis not present

## 2020-06-22 DIAGNOSIS — N2581 Secondary hyperparathyroidism of renal origin: Secondary | ICD-10-CM | POA: Diagnosis not present

## 2020-06-22 DIAGNOSIS — Z992 Dependence on renal dialysis: Secondary | ICD-10-CM | POA: Diagnosis not present

## 2020-06-22 DIAGNOSIS — Z4931 Encounter for adequacy testing for hemodialysis: Secondary | ICD-10-CM | POA: Diagnosis not present

## 2020-06-23 DIAGNOSIS — Z992 Dependence on renal dialysis: Secondary | ICD-10-CM | POA: Diagnosis not present

## 2020-06-23 DIAGNOSIS — N186 End stage renal disease: Secondary | ICD-10-CM | POA: Diagnosis not present

## 2020-06-23 DIAGNOSIS — T8610 Unspecified complication of kidney transplant: Secondary | ICD-10-CM | POA: Diagnosis not present

## 2020-06-25 DIAGNOSIS — D631 Anemia in chronic kidney disease: Secondary | ICD-10-CM | POA: Diagnosis not present

## 2020-06-25 DIAGNOSIS — D509 Iron deficiency anemia, unspecified: Secondary | ICD-10-CM | POA: Diagnosis not present

## 2020-06-25 DIAGNOSIS — Z4931 Encounter for adequacy testing for hemodialysis: Secondary | ICD-10-CM | POA: Diagnosis not present

## 2020-06-25 DIAGNOSIS — E1122 Type 2 diabetes mellitus with diabetic chronic kidney disease: Secondary | ICD-10-CM | POA: Diagnosis not present

## 2020-06-25 DIAGNOSIS — I953 Hypotension of hemodialysis: Secondary | ICD-10-CM | POA: Diagnosis not present

## 2020-06-25 DIAGNOSIS — Z794 Long term (current) use of insulin: Secondary | ICD-10-CM | POA: Diagnosis not present

## 2020-06-25 DIAGNOSIS — Z992 Dependence on renal dialysis: Secondary | ICD-10-CM | POA: Diagnosis not present

## 2020-06-25 DIAGNOSIS — N2581 Secondary hyperparathyroidism of renal origin: Secondary | ICD-10-CM | POA: Diagnosis not present

## 2020-06-25 DIAGNOSIS — I120 Hypertensive chronic kidney disease with stage 5 chronic kidney disease or end stage renal disease: Secondary | ICD-10-CM | POA: Diagnosis not present

## 2020-06-25 DIAGNOSIS — N186 End stage renal disease: Secondary | ICD-10-CM | POA: Diagnosis not present

## 2020-06-25 DIAGNOSIS — R519 Headache, unspecified: Secondary | ICD-10-CM | POA: Diagnosis not present

## 2020-06-25 DIAGNOSIS — R252 Cramp and spasm: Secondary | ICD-10-CM | POA: Diagnosis not present

## 2020-06-26 DIAGNOSIS — D631 Anemia in chronic kidney disease: Secondary | ICD-10-CM | POA: Diagnosis not present

## 2020-06-26 DIAGNOSIS — Z4931 Encounter for adequacy testing for hemodialysis: Secondary | ICD-10-CM | POA: Diagnosis not present

## 2020-06-26 DIAGNOSIS — Z794 Long term (current) use of insulin: Secondary | ICD-10-CM | POA: Diagnosis not present

## 2020-06-26 DIAGNOSIS — N186 End stage renal disease: Secondary | ICD-10-CM | POA: Diagnosis not present

## 2020-06-26 DIAGNOSIS — D509 Iron deficiency anemia, unspecified: Secondary | ICD-10-CM | POA: Diagnosis not present

## 2020-06-26 DIAGNOSIS — Z992 Dependence on renal dialysis: Secondary | ICD-10-CM | POA: Diagnosis not present

## 2020-06-27 ENCOUNTER — Ambulatory Visit
Admission: RE | Admit: 2020-06-27 | Discharge: 2020-06-27 | Disposition: A | Discharge status: Home / Self Care | Payer: Medicare Other | Source: Ambulatory Visit | Attending: Family Medicine | Admitting: Family Medicine

## 2020-06-27 DIAGNOSIS — Z794 Long term (current) use of insulin: Secondary | ICD-10-CM | POA: Diagnosis not present

## 2020-06-27 DIAGNOSIS — Z992 Dependence on renal dialysis: Secondary | ICD-10-CM | POA: Diagnosis not present

## 2020-06-27 DIAGNOSIS — N186 End stage renal disease: Secondary | ICD-10-CM | POA: Diagnosis not present

## 2020-06-27 DIAGNOSIS — D509 Iron deficiency anemia, unspecified: Secondary | ICD-10-CM | POA: Diagnosis not present

## 2020-06-27 DIAGNOSIS — Z4931 Encounter for adequacy testing for hemodialysis: Secondary | ICD-10-CM | POA: Diagnosis not present

## 2020-06-27 DIAGNOSIS — E041 Nontoxic single thyroid nodule: Secondary | ICD-10-CM | POA: Diagnosis not present

## 2020-06-27 DIAGNOSIS — D631 Anemia in chronic kidney disease: Secondary | ICD-10-CM | POA: Diagnosis not present

## 2020-06-27 DIAGNOSIS — E042 Nontoxic multinodular goiter: Secondary | ICD-10-CM

## 2020-06-28 DIAGNOSIS — D631 Anemia in chronic kidney disease: Secondary | ICD-10-CM | POA: Diagnosis not present

## 2020-06-28 DIAGNOSIS — Z4931 Encounter for adequacy testing for hemodialysis: Secondary | ICD-10-CM | POA: Diagnosis not present

## 2020-06-28 DIAGNOSIS — Z794 Long term (current) use of insulin: Secondary | ICD-10-CM | POA: Diagnosis not present

## 2020-06-28 DIAGNOSIS — Z992 Dependence on renal dialysis: Secondary | ICD-10-CM | POA: Diagnosis not present

## 2020-06-28 DIAGNOSIS — D509 Iron deficiency anemia, unspecified: Secondary | ICD-10-CM | POA: Diagnosis not present

## 2020-06-28 DIAGNOSIS — N186 End stage renal disease: Secondary | ICD-10-CM | POA: Diagnosis not present

## 2020-06-29 DIAGNOSIS — D631 Anemia in chronic kidney disease: Secondary | ICD-10-CM | POA: Diagnosis not present

## 2020-06-29 DIAGNOSIS — Z992 Dependence on renal dialysis: Secondary | ICD-10-CM | POA: Diagnosis not present

## 2020-06-29 DIAGNOSIS — Z794 Long term (current) use of insulin: Secondary | ICD-10-CM | POA: Diagnosis not present

## 2020-06-29 DIAGNOSIS — Z4931 Encounter for adequacy testing for hemodialysis: Secondary | ICD-10-CM | POA: Diagnosis not present

## 2020-06-29 DIAGNOSIS — N186 End stage renal disease: Secondary | ICD-10-CM | POA: Diagnosis not present

## 2020-06-29 DIAGNOSIS — D509 Iron deficiency anemia, unspecified: Secondary | ICD-10-CM | POA: Diagnosis not present

## 2020-07-01 DIAGNOSIS — N186 End stage renal disease: Secondary | ICD-10-CM | POA: Diagnosis not present

## 2020-07-01 DIAGNOSIS — M10379 Gout due to renal impairment, unspecified ankle and foot: Secondary | ICD-10-CM | POA: Diagnosis not present

## 2020-07-01 DIAGNOSIS — D509 Iron deficiency anemia, unspecified: Secondary | ICD-10-CM | POA: Diagnosis not present

## 2020-07-01 DIAGNOSIS — M109 Gout, unspecified: Secondary | ICD-10-CM | POA: Diagnosis not present

## 2020-07-01 DIAGNOSIS — D631 Anemia in chronic kidney disease: Secondary | ICD-10-CM | POA: Diagnosis not present

## 2020-07-01 DIAGNOSIS — Z794 Long term (current) use of insulin: Secondary | ICD-10-CM | POA: Diagnosis not present

## 2020-07-01 DIAGNOSIS — Z4931 Encounter for adequacy testing for hemodialysis: Secondary | ICD-10-CM | POA: Diagnosis not present

## 2020-07-01 DIAGNOSIS — E1129 Type 2 diabetes mellitus with other diabetic kidney complication: Secondary | ICD-10-CM | POA: Diagnosis not present

## 2020-07-01 DIAGNOSIS — Z992 Dependence on renal dialysis: Secondary | ICD-10-CM | POA: Diagnosis not present

## 2020-07-02 DIAGNOSIS — D509 Iron deficiency anemia, unspecified: Secondary | ICD-10-CM | POA: Diagnosis not present

## 2020-07-02 DIAGNOSIS — D631 Anemia in chronic kidney disease: Secondary | ICD-10-CM | POA: Diagnosis not present

## 2020-07-02 DIAGNOSIS — N186 End stage renal disease: Secondary | ICD-10-CM | POA: Diagnosis not present

## 2020-07-02 DIAGNOSIS — Z992 Dependence on renal dialysis: Secondary | ICD-10-CM | POA: Diagnosis not present

## 2020-07-02 DIAGNOSIS — Z4931 Encounter for adequacy testing for hemodialysis: Secondary | ICD-10-CM | POA: Diagnosis not present

## 2020-07-02 DIAGNOSIS — Z794 Long term (current) use of insulin: Secondary | ICD-10-CM | POA: Diagnosis not present

## 2020-07-04 DIAGNOSIS — D509 Iron deficiency anemia, unspecified: Secondary | ICD-10-CM | POA: Diagnosis not present

## 2020-07-04 DIAGNOSIS — Z992 Dependence on renal dialysis: Secondary | ICD-10-CM | POA: Diagnosis not present

## 2020-07-04 DIAGNOSIS — Z4931 Encounter for adequacy testing for hemodialysis: Secondary | ICD-10-CM | POA: Diagnosis not present

## 2020-07-04 DIAGNOSIS — Z794 Long term (current) use of insulin: Secondary | ICD-10-CM | POA: Diagnosis not present

## 2020-07-04 DIAGNOSIS — N186 End stage renal disease: Secondary | ICD-10-CM | POA: Diagnosis not present

## 2020-07-04 DIAGNOSIS — D631 Anemia in chronic kidney disease: Secondary | ICD-10-CM | POA: Diagnosis not present

## 2020-07-05 DIAGNOSIS — N186 End stage renal disease: Secondary | ICD-10-CM | POA: Diagnosis not present

## 2020-07-05 DIAGNOSIS — D631 Anemia in chronic kidney disease: Secondary | ICD-10-CM | POA: Diagnosis not present

## 2020-07-05 DIAGNOSIS — Z992 Dependence on renal dialysis: Secondary | ICD-10-CM | POA: Diagnosis not present

## 2020-07-05 DIAGNOSIS — Z794 Long term (current) use of insulin: Secondary | ICD-10-CM | POA: Diagnosis not present

## 2020-07-05 DIAGNOSIS — Z4931 Encounter for adequacy testing for hemodialysis: Secondary | ICD-10-CM | POA: Diagnosis not present

## 2020-07-05 DIAGNOSIS — D509 Iron deficiency anemia, unspecified: Secondary | ICD-10-CM | POA: Diagnosis not present

## 2020-07-07 DIAGNOSIS — N186 End stage renal disease: Secondary | ICD-10-CM | POA: Diagnosis not present

## 2020-07-07 DIAGNOSIS — Z4931 Encounter for adequacy testing for hemodialysis: Secondary | ICD-10-CM | POA: Diagnosis not present

## 2020-07-07 DIAGNOSIS — Z794 Long term (current) use of insulin: Secondary | ICD-10-CM | POA: Diagnosis not present

## 2020-07-07 DIAGNOSIS — D509 Iron deficiency anemia, unspecified: Secondary | ICD-10-CM | POA: Diagnosis not present

## 2020-07-07 DIAGNOSIS — D631 Anemia in chronic kidney disease: Secondary | ICD-10-CM | POA: Diagnosis not present

## 2020-07-07 DIAGNOSIS — Z992 Dependence on renal dialysis: Secondary | ICD-10-CM | POA: Diagnosis not present

## 2020-07-08 DIAGNOSIS — N186 End stage renal disease: Secondary | ICD-10-CM | POA: Diagnosis not present

## 2020-07-08 DIAGNOSIS — Z4931 Encounter for adequacy testing for hemodialysis: Secondary | ICD-10-CM | POA: Diagnosis not present

## 2020-07-08 DIAGNOSIS — Z794 Long term (current) use of insulin: Secondary | ICD-10-CM | POA: Diagnosis not present

## 2020-07-08 DIAGNOSIS — D631 Anemia in chronic kidney disease: Secondary | ICD-10-CM | POA: Diagnosis not present

## 2020-07-08 DIAGNOSIS — D509 Iron deficiency anemia, unspecified: Secondary | ICD-10-CM | POA: Diagnosis not present

## 2020-07-08 DIAGNOSIS — Z992 Dependence on renal dialysis: Secondary | ICD-10-CM | POA: Diagnosis not present

## 2020-07-09 DIAGNOSIS — Z4931 Encounter for adequacy testing for hemodialysis: Secondary | ICD-10-CM | POA: Diagnosis not present

## 2020-07-09 DIAGNOSIS — N186 End stage renal disease: Secondary | ICD-10-CM | POA: Diagnosis not present

## 2020-07-09 DIAGNOSIS — D631 Anemia in chronic kidney disease: Secondary | ICD-10-CM | POA: Diagnosis not present

## 2020-07-09 DIAGNOSIS — D509 Iron deficiency anemia, unspecified: Secondary | ICD-10-CM | POA: Diagnosis not present

## 2020-07-09 DIAGNOSIS — Z794 Long term (current) use of insulin: Secondary | ICD-10-CM | POA: Diagnosis not present

## 2020-07-09 DIAGNOSIS — Z992 Dependence on renal dialysis: Secondary | ICD-10-CM | POA: Diagnosis not present

## 2020-07-11 DIAGNOSIS — N186 End stage renal disease: Secondary | ICD-10-CM | POA: Diagnosis not present

## 2020-07-11 DIAGNOSIS — D631 Anemia in chronic kidney disease: Secondary | ICD-10-CM | POA: Diagnosis not present

## 2020-07-11 DIAGNOSIS — Z4931 Encounter for adequacy testing for hemodialysis: Secondary | ICD-10-CM | POA: Diagnosis not present

## 2020-07-11 DIAGNOSIS — Z794 Long term (current) use of insulin: Secondary | ICD-10-CM | POA: Diagnosis not present

## 2020-07-11 DIAGNOSIS — D509 Iron deficiency anemia, unspecified: Secondary | ICD-10-CM | POA: Diagnosis not present

## 2020-07-11 DIAGNOSIS — Z992 Dependence on renal dialysis: Secondary | ICD-10-CM | POA: Diagnosis not present

## 2020-07-12 DIAGNOSIS — E042 Nontoxic multinodular goiter: Secondary | ICD-10-CM | POA: Diagnosis not present

## 2020-07-12 DIAGNOSIS — D509 Iron deficiency anemia, unspecified: Secondary | ICD-10-CM | POA: Diagnosis not present

## 2020-07-12 DIAGNOSIS — Z992 Dependence on renal dialysis: Secondary | ICD-10-CM | POA: Diagnosis not present

## 2020-07-12 DIAGNOSIS — E1129 Type 2 diabetes mellitus with other diabetic kidney complication: Secondary | ICD-10-CM | POA: Diagnosis not present

## 2020-07-12 DIAGNOSIS — E785 Hyperlipidemia, unspecified: Secondary | ICD-10-CM | POA: Diagnosis not present

## 2020-07-12 DIAGNOSIS — M859 Disorder of bone density and structure, unspecified: Secondary | ICD-10-CM | POA: Diagnosis not present

## 2020-07-12 DIAGNOSIS — N186 End stage renal disease: Secondary | ICD-10-CM | POA: Diagnosis not present

## 2020-07-12 DIAGNOSIS — D631 Anemia in chronic kidney disease: Secondary | ICD-10-CM | POA: Diagnosis not present

## 2020-07-12 DIAGNOSIS — Z4931 Encounter for adequacy testing for hemodialysis: Secondary | ICD-10-CM | POA: Diagnosis not present

## 2020-07-12 DIAGNOSIS — M109 Gout, unspecified: Secondary | ICD-10-CM | POA: Diagnosis not present

## 2020-07-12 DIAGNOSIS — Z794 Long term (current) use of insulin: Secondary | ICD-10-CM | POA: Diagnosis not present

## 2020-07-15 DIAGNOSIS — Z794 Long term (current) use of insulin: Secondary | ICD-10-CM | POA: Diagnosis not present

## 2020-07-15 DIAGNOSIS — N186 End stage renal disease: Secondary | ICD-10-CM | POA: Diagnosis not present

## 2020-07-15 DIAGNOSIS — D631 Anemia in chronic kidney disease: Secondary | ICD-10-CM | POA: Diagnosis not present

## 2020-07-15 DIAGNOSIS — Z992 Dependence on renal dialysis: Secondary | ICD-10-CM | POA: Diagnosis not present

## 2020-07-15 DIAGNOSIS — D509 Iron deficiency anemia, unspecified: Secondary | ICD-10-CM | POA: Diagnosis not present

## 2020-07-15 DIAGNOSIS — Z4931 Encounter for adequacy testing for hemodialysis: Secondary | ICD-10-CM | POA: Diagnosis not present

## 2020-07-16 DIAGNOSIS — Z992 Dependence on renal dialysis: Secondary | ICD-10-CM | POA: Diagnosis not present

## 2020-07-16 DIAGNOSIS — D509 Iron deficiency anemia, unspecified: Secondary | ICD-10-CM | POA: Diagnosis not present

## 2020-07-16 DIAGNOSIS — Z794 Long term (current) use of insulin: Secondary | ICD-10-CM | POA: Diagnosis not present

## 2020-07-16 DIAGNOSIS — Z4931 Encounter for adequacy testing for hemodialysis: Secondary | ICD-10-CM | POA: Diagnosis not present

## 2020-07-16 DIAGNOSIS — N186 End stage renal disease: Secondary | ICD-10-CM | POA: Diagnosis not present

## 2020-07-16 DIAGNOSIS — D631 Anemia in chronic kidney disease: Secondary | ICD-10-CM | POA: Diagnosis not present

## 2020-07-17 DIAGNOSIS — M8580 Other specified disorders of bone density and structure, unspecified site: Secondary | ICD-10-CM | POA: Diagnosis not present

## 2020-07-17 DIAGNOSIS — Z940 Kidney transplant status: Secondary | ICD-10-CM | POA: Diagnosis not present

## 2020-07-17 DIAGNOSIS — E0590 Thyrotoxicosis, unspecified without thyrotoxic crisis or storm: Secondary | ICD-10-CM | POA: Diagnosis not present

## 2020-07-17 DIAGNOSIS — Z992 Dependence on renal dialysis: Secondary | ICD-10-CM | POA: Diagnosis not present

## 2020-07-17 DIAGNOSIS — N186 End stage renal disease: Secondary | ICD-10-CM | POA: Diagnosis not present

## 2020-07-17 DIAGNOSIS — D849 Immunodeficiency, unspecified: Secondary | ICD-10-CM | POA: Diagnosis not present

## 2020-07-17 DIAGNOSIS — D638 Anemia in other chronic diseases classified elsewhere: Secondary | ICD-10-CM | POA: Diagnosis not present

## 2020-07-17 DIAGNOSIS — N2581 Secondary hyperparathyroidism of renal origin: Secondary | ICD-10-CM | POA: Diagnosis not present

## 2020-07-17 DIAGNOSIS — I10 Essential (primary) hypertension: Secondary | ICD-10-CM | POA: Diagnosis not present

## 2020-07-17 DIAGNOSIS — E1129 Type 2 diabetes mellitus with other diabetic kidney complication: Secondary | ICD-10-CM | POA: Diagnosis not present

## 2020-07-17 DIAGNOSIS — E785 Hyperlipidemia, unspecified: Secondary | ICD-10-CM | POA: Diagnosis not present

## 2020-07-17 DIAGNOSIS — Z0000 Encounter for general adult medical examination without abnormal findings: Secondary | ICD-10-CM | POA: Diagnosis not present

## 2020-07-18 DIAGNOSIS — Z794 Long term (current) use of insulin: Secondary | ICD-10-CM | POA: Diagnosis not present

## 2020-07-18 DIAGNOSIS — Z4931 Encounter for adequacy testing for hemodialysis: Secondary | ICD-10-CM | POA: Diagnosis not present

## 2020-07-18 DIAGNOSIS — D631 Anemia in chronic kidney disease: Secondary | ICD-10-CM | POA: Diagnosis not present

## 2020-07-18 DIAGNOSIS — Z992 Dependence on renal dialysis: Secondary | ICD-10-CM | POA: Diagnosis not present

## 2020-07-18 DIAGNOSIS — N186 End stage renal disease: Secondary | ICD-10-CM | POA: Diagnosis not present

## 2020-07-18 DIAGNOSIS — D509 Iron deficiency anemia, unspecified: Secondary | ICD-10-CM | POA: Diagnosis not present

## 2020-07-19 DIAGNOSIS — Z992 Dependence on renal dialysis: Secondary | ICD-10-CM | POA: Diagnosis not present

## 2020-07-19 DIAGNOSIS — N186 End stage renal disease: Secondary | ICD-10-CM | POA: Diagnosis not present

## 2020-07-19 DIAGNOSIS — Z794 Long term (current) use of insulin: Secondary | ICD-10-CM | POA: Diagnosis not present

## 2020-07-19 DIAGNOSIS — Z4931 Encounter for adequacy testing for hemodialysis: Secondary | ICD-10-CM | POA: Diagnosis not present

## 2020-07-19 DIAGNOSIS — D631 Anemia in chronic kidney disease: Secondary | ICD-10-CM | POA: Diagnosis not present

## 2020-07-19 DIAGNOSIS — D509 Iron deficiency anemia, unspecified: Secondary | ICD-10-CM | POA: Diagnosis not present

## 2020-07-21 DIAGNOSIS — Z794 Long term (current) use of insulin: Secondary | ICD-10-CM | POA: Diagnosis not present

## 2020-07-21 DIAGNOSIS — D631 Anemia in chronic kidney disease: Secondary | ICD-10-CM | POA: Diagnosis not present

## 2020-07-21 DIAGNOSIS — Z992 Dependence on renal dialysis: Secondary | ICD-10-CM | POA: Diagnosis not present

## 2020-07-21 DIAGNOSIS — Z4931 Encounter for adequacy testing for hemodialysis: Secondary | ICD-10-CM | POA: Diagnosis not present

## 2020-07-21 DIAGNOSIS — D509 Iron deficiency anemia, unspecified: Secondary | ICD-10-CM | POA: Diagnosis not present

## 2020-07-21 DIAGNOSIS — N186 End stage renal disease: Secondary | ICD-10-CM | POA: Diagnosis not present

## 2020-07-22 DIAGNOSIS — N186 End stage renal disease: Secondary | ICD-10-CM | POA: Diagnosis not present

## 2020-07-22 DIAGNOSIS — Z794 Long term (current) use of insulin: Secondary | ICD-10-CM | POA: Diagnosis not present

## 2020-07-22 DIAGNOSIS — D631 Anemia in chronic kidney disease: Secondary | ICD-10-CM | POA: Diagnosis not present

## 2020-07-22 DIAGNOSIS — D509 Iron deficiency anemia, unspecified: Secondary | ICD-10-CM | POA: Diagnosis not present

## 2020-07-22 DIAGNOSIS — Z4931 Encounter for adequacy testing for hemodialysis: Secondary | ICD-10-CM | POA: Diagnosis not present

## 2020-07-22 DIAGNOSIS — Z992 Dependence on renal dialysis: Secondary | ICD-10-CM | POA: Diagnosis not present

## 2020-07-23 DIAGNOSIS — N186 End stage renal disease: Secondary | ICD-10-CM | POA: Diagnosis not present

## 2020-07-23 DIAGNOSIS — D631 Anemia in chronic kidney disease: Secondary | ICD-10-CM | POA: Diagnosis not present

## 2020-07-23 DIAGNOSIS — D509 Iron deficiency anemia, unspecified: Secondary | ICD-10-CM | POA: Diagnosis not present

## 2020-07-23 DIAGNOSIS — Z794 Long term (current) use of insulin: Secondary | ICD-10-CM | POA: Diagnosis not present

## 2020-07-23 DIAGNOSIS — Z4931 Encounter for adequacy testing for hemodialysis: Secondary | ICD-10-CM | POA: Diagnosis not present

## 2020-07-23 DIAGNOSIS — Z992 Dependence on renal dialysis: Secondary | ICD-10-CM | POA: Diagnosis not present

## 2020-07-24 DIAGNOSIS — N186 End stage renal disease: Secondary | ICD-10-CM | POA: Diagnosis not present

## 2020-07-24 DIAGNOSIS — Z992 Dependence on renal dialysis: Secondary | ICD-10-CM | POA: Diagnosis not present

## 2020-07-24 DIAGNOSIS — T8610 Unspecified complication of kidney transplant: Secondary | ICD-10-CM | POA: Diagnosis not present

## 2020-07-25 DIAGNOSIS — R252 Cramp and spasm: Secondary | ICD-10-CM | POA: Diagnosis not present

## 2020-07-25 DIAGNOSIS — I120 Hypertensive chronic kidney disease with stage 5 chronic kidney disease or end stage renal disease: Secondary | ICD-10-CM | POA: Diagnosis not present

## 2020-07-25 DIAGNOSIS — D509 Iron deficiency anemia, unspecified: Secondary | ICD-10-CM | POA: Diagnosis not present

## 2020-07-25 DIAGNOSIS — N2581 Secondary hyperparathyroidism of renal origin: Secondary | ICD-10-CM | POA: Diagnosis not present

## 2020-07-25 DIAGNOSIS — E1122 Type 2 diabetes mellitus with diabetic chronic kidney disease: Secondary | ICD-10-CM | POA: Diagnosis not present

## 2020-07-25 DIAGNOSIS — Z992 Dependence on renal dialysis: Secondary | ICD-10-CM | POA: Diagnosis not present

## 2020-07-25 DIAGNOSIS — Z794 Long term (current) use of insulin: Secondary | ICD-10-CM | POA: Diagnosis not present

## 2020-07-25 DIAGNOSIS — N186 End stage renal disease: Secondary | ICD-10-CM | POA: Diagnosis not present

## 2020-07-25 DIAGNOSIS — I953 Hypotension of hemodialysis: Secondary | ICD-10-CM | POA: Diagnosis not present

## 2020-07-25 DIAGNOSIS — R519 Headache, unspecified: Secondary | ICD-10-CM | POA: Diagnosis not present

## 2020-07-26 DIAGNOSIS — N2581 Secondary hyperparathyroidism of renal origin: Secondary | ICD-10-CM | POA: Diagnosis not present

## 2020-07-26 DIAGNOSIS — Z794 Long term (current) use of insulin: Secondary | ICD-10-CM | POA: Diagnosis not present

## 2020-07-26 DIAGNOSIS — R519 Headache, unspecified: Secondary | ICD-10-CM | POA: Diagnosis not present

## 2020-07-26 DIAGNOSIS — Z992 Dependence on renal dialysis: Secondary | ICD-10-CM | POA: Diagnosis not present

## 2020-07-26 DIAGNOSIS — N186 End stage renal disease: Secondary | ICD-10-CM | POA: Diagnosis not present

## 2020-07-26 DIAGNOSIS — D509 Iron deficiency anemia, unspecified: Secondary | ICD-10-CM | POA: Diagnosis not present

## 2020-07-27 DIAGNOSIS — D509 Iron deficiency anemia, unspecified: Secondary | ICD-10-CM | POA: Diagnosis not present

## 2020-07-27 DIAGNOSIS — R519 Headache, unspecified: Secondary | ICD-10-CM | POA: Diagnosis not present

## 2020-07-27 DIAGNOSIS — Z992 Dependence on renal dialysis: Secondary | ICD-10-CM | POA: Diagnosis not present

## 2020-07-27 DIAGNOSIS — N186 End stage renal disease: Secondary | ICD-10-CM | POA: Diagnosis not present

## 2020-07-27 DIAGNOSIS — Z794 Long term (current) use of insulin: Secondary | ICD-10-CM | POA: Diagnosis not present

## 2020-07-27 DIAGNOSIS — N2581 Secondary hyperparathyroidism of renal origin: Secondary | ICD-10-CM | POA: Diagnosis not present

## 2020-07-29 DIAGNOSIS — R519 Headache, unspecified: Secondary | ICD-10-CM | POA: Diagnosis not present

## 2020-07-29 DIAGNOSIS — D509 Iron deficiency anemia, unspecified: Secondary | ICD-10-CM | POA: Diagnosis not present

## 2020-07-29 DIAGNOSIS — N2581 Secondary hyperparathyroidism of renal origin: Secondary | ICD-10-CM | POA: Diagnosis not present

## 2020-07-29 DIAGNOSIS — Z992 Dependence on renal dialysis: Secondary | ICD-10-CM | POA: Diagnosis not present

## 2020-07-29 DIAGNOSIS — Z794 Long term (current) use of insulin: Secondary | ICD-10-CM | POA: Diagnosis not present

## 2020-07-29 DIAGNOSIS — N186 End stage renal disease: Secondary | ICD-10-CM | POA: Diagnosis not present

## 2020-07-30 DIAGNOSIS — N2581 Secondary hyperparathyroidism of renal origin: Secondary | ICD-10-CM | POA: Diagnosis not present

## 2020-07-30 DIAGNOSIS — R519 Headache, unspecified: Secondary | ICD-10-CM | POA: Diagnosis not present

## 2020-07-30 DIAGNOSIS — Z992 Dependence on renal dialysis: Secondary | ICD-10-CM | POA: Diagnosis not present

## 2020-07-30 DIAGNOSIS — Z794 Long term (current) use of insulin: Secondary | ICD-10-CM | POA: Diagnosis not present

## 2020-07-30 DIAGNOSIS — D509 Iron deficiency anemia, unspecified: Secondary | ICD-10-CM | POA: Diagnosis not present

## 2020-07-30 DIAGNOSIS — N186 End stage renal disease: Secondary | ICD-10-CM | POA: Diagnosis not present

## 2020-07-31 DIAGNOSIS — E042 Nontoxic multinodular goiter: Secondary | ICD-10-CM | POA: Diagnosis not present

## 2020-08-01 DIAGNOSIS — D509 Iron deficiency anemia, unspecified: Secondary | ICD-10-CM | POA: Diagnosis not present

## 2020-08-01 DIAGNOSIS — Z794 Long term (current) use of insulin: Secondary | ICD-10-CM | POA: Diagnosis not present

## 2020-08-01 DIAGNOSIS — R519 Headache, unspecified: Secondary | ICD-10-CM | POA: Diagnosis not present

## 2020-08-01 DIAGNOSIS — Z992 Dependence on renal dialysis: Secondary | ICD-10-CM | POA: Diagnosis not present

## 2020-08-01 DIAGNOSIS — N186 End stage renal disease: Secondary | ICD-10-CM | POA: Diagnosis not present

## 2020-08-01 DIAGNOSIS — N2581 Secondary hyperparathyroidism of renal origin: Secondary | ICD-10-CM | POA: Diagnosis not present

## 2020-08-02 DIAGNOSIS — N186 End stage renal disease: Secondary | ICD-10-CM | POA: Diagnosis not present

## 2020-08-02 DIAGNOSIS — D509 Iron deficiency anemia, unspecified: Secondary | ICD-10-CM | POA: Diagnosis not present

## 2020-08-02 DIAGNOSIS — Z992 Dependence on renal dialysis: Secondary | ICD-10-CM | POA: Diagnosis not present

## 2020-08-02 DIAGNOSIS — N2581 Secondary hyperparathyroidism of renal origin: Secondary | ICD-10-CM | POA: Diagnosis not present

## 2020-08-02 DIAGNOSIS — Z794 Long term (current) use of insulin: Secondary | ICD-10-CM | POA: Diagnosis not present

## 2020-08-02 DIAGNOSIS — R519 Headache, unspecified: Secondary | ICD-10-CM | POA: Diagnosis not present

## 2020-08-05 DIAGNOSIS — N2581 Secondary hyperparathyroidism of renal origin: Secondary | ICD-10-CM | POA: Diagnosis not present

## 2020-08-05 DIAGNOSIS — D509 Iron deficiency anemia, unspecified: Secondary | ICD-10-CM | POA: Diagnosis not present

## 2020-08-05 DIAGNOSIS — N186 End stage renal disease: Secondary | ICD-10-CM | POA: Diagnosis not present

## 2020-08-05 DIAGNOSIS — Z794 Long term (current) use of insulin: Secondary | ICD-10-CM | POA: Diagnosis not present

## 2020-08-05 DIAGNOSIS — Z992 Dependence on renal dialysis: Secondary | ICD-10-CM | POA: Diagnosis not present

## 2020-08-05 DIAGNOSIS — R519 Headache, unspecified: Secondary | ICD-10-CM | POA: Diagnosis not present

## 2020-08-06 DIAGNOSIS — M109 Gout, unspecified: Secondary | ICD-10-CM | POA: Diagnosis not present

## 2020-08-06 DIAGNOSIS — Z992 Dependence on renal dialysis: Secondary | ICD-10-CM | POA: Diagnosis not present

## 2020-08-06 DIAGNOSIS — Z794 Long term (current) use of insulin: Secondary | ICD-10-CM | POA: Diagnosis not present

## 2020-08-06 DIAGNOSIS — M10379 Gout due to renal impairment, unspecified ankle and foot: Secondary | ICD-10-CM | POA: Diagnosis not present

## 2020-08-06 DIAGNOSIS — D509 Iron deficiency anemia, unspecified: Secondary | ICD-10-CM | POA: Diagnosis not present

## 2020-08-06 DIAGNOSIS — N2581 Secondary hyperparathyroidism of renal origin: Secondary | ICD-10-CM | POA: Diagnosis not present

## 2020-08-06 DIAGNOSIS — R519 Headache, unspecified: Secondary | ICD-10-CM | POA: Diagnosis not present

## 2020-08-06 DIAGNOSIS — N186 End stage renal disease: Secondary | ICD-10-CM | POA: Diagnosis not present

## 2020-08-08 DIAGNOSIS — R519 Headache, unspecified: Secondary | ICD-10-CM | POA: Diagnosis not present

## 2020-08-08 DIAGNOSIS — D509 Iron deficiency anemia, unspecified: Secondary | ICD-10-CM | POA: Diagnosis not present

## 2020-08-08 DIAGNOSIS — Z794 Long term (current) use of insulin: Secondary | ICD-10-CM | POA: Diagnosis not present

## 2020-08-08 DIAGNOSIS — N186 End stage renal disease: Secondary | ICD-10-CM | POA: Diagnosis not present

## 2020-08-08 DIAGNOSIS — Z992 Dependence on renal dialysis: Secondary | ICD-10-CM | POA: Diagnosis not present

## 2020-08-08 DIAGNOSIS — N2581 Secondary hyperparathyroidism of renal origin: Secondary | ICD-10-CM | POA: Diagnosis not present

## 2020-08-09 DIAGNOSIS — N2581 Secondary hyperparathyroidism of renal origin: Secondary | ICD-10-CM | POA: Diagnosis not present

## 2020-08-09 DIAGNOSIS — D509 Iron deficiency anemia, unspecified: Secondary | ICD-10-CM | POA: Diagnosis not present

## 2020-08-09 DIAGNOSIS — R519 Headache, unspecified: Secondary | ICD-10-CM | POA: Diagnosis not present

## 2020-08-09 DIAGNOSIS — N186 End stage renal disease: Secondary | ICD-10-CM | POA: Diagnosis not present

## 2020-08-09 DIAGNOSIS — Z794 Long term (current) use of insulin: Secondary | ICD-10-CM | POA: Diagnosis not present

## 2020-08-09 DIAGNOSIS — Z992 Dependence on renal dialysis: Secondary | ICD-10-CM | POA: Diagnosis not present

## 2020-08-12 DIAGNOSIS — N2581 Secondary hyperparathyroidism of renal origin: Secondary | ICD-10-CM | POA: Diagnosis not present

## 2020-08-12 DIAGNOSIS — R519 Headache, unspecified: Secondary | ICD-10-CM | POA: Diagnosis not present

## 2020-08-12 DIAGNOSIS — Z794 Long term (current) use of insulin: Secondary | ICD-10-CM | POA: Diagnosis not present

## 2020-08-12 DIAGNOSIS — Z992 Dependence on renal dialysis: Secondary | ICD-10-CM | POA: Diagnosis not present

## 2020-08-12 DIAGNOSIS — D509 Iron deficiency anemia, unspecified: Secondary | ICD-10-CM | POA: Diagnosis not present

## 2020-08-12 DIAGNOSIS — N186 End stage renal disease: Secondary | ICD-10-CM | POA: Diagnosis not present

## 2020-08-13 DIAGNOSIS — N2581 Secondary hyperparathyroidism of renal origin: Secondary | ICD-10-CM | POA: Diagnosis not present

## 2020-08-13 DIAGNOSIS — D509 Iron deficiency anemia, unspecified: Secondary | ICD-10-CM | POA: Diagnosis not present

## 2020-08-13 DIAGNOSIS — R519 Headache, unspecified: Secondary | ICD-10-CM | POA: Diagnosis not present

## 2020-08-13 DIAGNOSIS — Z794 Long term (current) use of insulin: Secondary | ICD-10-CM | POA: Diagnosis not present

## 2020-08-13 DIAGNOSIS — Z992 Dependence on renal dialysis: Secondary | ICD-10-CM | POA: Diagnosis not present

## 2020-08-13 DIAGNOSIS — N186 End stage renal disease: Secondary | ICD-10-CM | POA: Diagnosis not present

## 2020-08-15 DIAGNOSIS — R519 Headache, unspecified: Secondary | ICD-10-CM | POA: Diagnosis not present

## 2020-08-15 DIAGNOSIS — N186 End stage renal disease: Secondary | ICD-10-CM | POA: Diagnosis not present

## 2020-08-15 DIAGNOSIS — D509 Iron deficiency anemia, unspecified: Secondary | ICD-10-CM | POA: Diagnosis not present

## 2020-08-15 DIAGNOSIS — Z794 Long term (current) use of insulin: Secondary | ICD-10-CM | POA: Diagnosis not present

## 2020-08-15 DIAGNOSIS — Z992 Dependence on renal dialysis: Secondary | ICD-10-CM | POA: Diagnosis not present

## 2020-08-15 DIAGNOSIS — N2581 Secondary hyperparathyroidism of renal origin: Secondary | ICD-10-CM | POA: Diagnosis not present

## 2020-08-16 DIAGNOSIS — Z794 Long term (current) use of insulin: Secondary | ICD-10-CM | POA: Diagnosis not present

## 2020-08-16 DIAGNOSIS — N186 End stage renal disease: Secondary | ICD-10-CM | POA: Diagnosis not present

## 2020-08-16 DIAGNOSIS — Z992 Dependence on renal dialysis: Secondary | ICD-10-CM | POA: Diagnosis not present

## 2020-08-16 DIAGNOSIS — R519 Headache, unspecified: Secondary | ICD-10-CM | POA: Diagnosis not present

## 2020-08-16 DIAGNOSIS — N2581 Secondary hyperparathyroidism of renal origin: Secondary | ICD-10-CM | POA: Diagnosis not present

## 2020-08-16 DIAGNOSIS — D509 Iron deficiency anemia, unspecified: Secondary | ICD-10-CM | POA: Diagnosis not present

## 2020-08-19 DIAGNOSIS — Z794 Long term (current) use of insulin: Secondary | ICD-10-CM | POA: Diagnosis not present

## 2020-08-19 DIAGNOSIS — N2581 Secondary hyperparathyroidism of renal origin: Secondary | ICD-10-CM | POA: Diagnosis not present

## 2020-08-19 DIAGNOSIS — N186 End stage renal disease: Secondary | ICD-10-CM | POA: Diagnosis not present

## 2020-08-19 DIAGNOSIS — Z992 Dependence on renal dialysis: Secondary | ICD-10-CM | POA: Diagnosis not present

## 2020-08-19 DIAGNOSIS — R519 Headache, unspecified: Secondary | ICD-10-CM | POA: Diagnosis not present

## 2020-08-19 DIAGNOSIS — D509 Iron deficiency anemia, unspecified: Secondary | ICD-10-CM | POA: Diagnosis not present

## 2020-08-20 DIAGNOSIS — D509 Iron deficiency anemia, unspecified: Secondary | ICD-10-CM | POA: Diagnosis not present

## 2020-08-20 DIAGNOSIS — R519 Headache, unspecified: Secondary | ICD-10-CM | POA: Diagnosis not present

## 2020-08-20 DIAGNOSIS — Z794 Long term (current) use of insulin: Secondary | ICD-10-CM | POA: Diagnosis not present

## 2020-08-20 DIAGNOSIS — N186 End stage renal disease: Secondary | ICD-10-CM | POA: Diagnosis not present

## 2020-08-20 DIAGNOSIS — Z992 Dependence on renal dialysis: Secondary | ICD-10-CM | POA: Diagnosis not present

## 2020-08-20 DIAGNOSIS — N2581 Secondary hyperparathyroidism of renal origin: Secondary | ICD-10-CM | POA: Diagnosis not present

## 2020-08-21 DIAGNOSIS — N186 End stage renal disease: Secondary | ICD-10-CM | POA: Diagnosis not present

## 2020-08-21 DIAGNOSIS — Z992 Dependence on renal dialysis: Secondary | ICD-10-CM | POA: Diagnosis not present

## 2020-08-22 DIAGNOSIS — Z794 Long term (current) use of insulin: Secondary | ICD-10-CM | POA: Diagnosis not present

## 2020-08-22 DIAGNOSIS — I120 Hypertensive chronic kidney disease with stage 5 chronic kidney disease or end stage renal disease: Secondary | ICD-10-CM | POA: Diagnosis not present

## 2020-08-22 DIAGNOSIS — E1122 Type 2 diabetes mellitus with diabetic chronic kidney disease: Secondary | ICD-10-CM | POA: Diagnosis not present

## 2020-08-22 DIAGNOSIS — R519 Headache, unspecified: Secondary | ICD-10-CM | POA: Diagnosis not present

## 2020-08-22 DIAGNOSIS — I953 Hypotension of hemodialysis: Secondary | ICD-10-CM | POA: Diagnosis not present

## 2020-08-22 DIAGNOSIS — Z992 Dependence on renal dialysis: Secondary | ICD-10-CM | POA: Diagnosis not present

## 2020-08-22 DIAGNOSIS — N2581 Secondary hyperparathyroidism of renal origin: Secondary | ICD-10-CM | POA: Diagnosis not present

## 2020-08-22 DIAGNOSIS — R252 Cramp and spasm: Secondary | ICD-10-CM | POA: Diagnosis not present

## 2020-08-22 DIAGNOSIS — D631 Anemia in chronic kidney disease: Secondary | ICD-10-CM | POA: Diagnosis not present

## 2020-08-22 DIAGNOSIS — D509 Iron deficiency anemia, unspecified: Secondary | ICD-10-CM | POA: Diagnosis not present

## 2020-08-22 DIAGNOSIS — N186 End stage renal disease: Secondary | ICD-10-CM | POA: Diagnosis not present

## 2020-08-23 DIAGNOSIS — N2581 Secondary hyperparathyroidism of renal origin: Secondary | ICD-10-CM | POA: Diagnosis not present

## 2020-08-23 DIAGNOSIS — D631 Anemia in chronic kidney disease: Secondary | ICD-10-CM | POA: Diagnosis not present

## 2020-08-23 DIAGNOSIS — D509 Iron deficiency anemia, unspecified: Secondary | ICD-10-CM | POA: Diagnosis not present

## 2020-08-23 DIAGNOSIS — N186 End stage renal disease: Secondary | ICD-10-CM | POA: Diagnosis not present

## 2020-08-23 DIAGNOSIS — Z794 Long term (current) use of insulin: Secondary | ICD-10-CM | POA: Diagnosis not present

## 2020-08-23 DIAGNOSIS — Z992 Dependence on renal dialysis: Secondary | ICD-10-CM | POA: Diagnosis not present

## 2020-08-26 DIAGNOSIS — Z992 Dependence on renal dialysis: Secondary | ICD-10-CM | POA: Diagnosis not present

## 2020-08-26 DIAGNOSIS — N2581 Secondary hyperparathyroidism of renal origin: Secondary | ICD-10-CM | POA: Diagnosis not present

## 2020-08-26 DIAGNOSIS — D509 Iron deficiency anemia, unspecified: Secondary | ICD-10-CM | POA: Diagnosis not present

## 2020-08-26 DIAGNOSIS — Z794 Long term (current) use of insulin: Secondary | ICD-10-CM | POA: Diagnosis not present

## 2020-08-26 DIAGNOSIS — D631 Anemia in chronic kidney disease: Secondary | ICD-10-CM | POA: Diagnosis not present

## 2020-08-26 DIAGNOSIS — N186 End stage renal disease: Secondary | ICD-10-CM | POA: Diagnosis not present

## 2020-08-27 DIAGNOSIS — D509 Iron deficiency anemia, unspecified: Secondary | ICD-10-CM | POA: Diagnosis not present

## 2020-08-27 DIAGNOSIS — N186 End stage renal disease: Secondary | ICD-10-CM | POA: Diagnosis not present

## 2020-08-27 DIAGNOSIS — Z992 Dependence on renal dialysis: Secondary | ICD-10-CM | POA: Diagnosis not present

## 2020-08-27 DIAGNOSIS — D631 Anemia in chronic kidney disease: Secondary | ICD-10-CM | POA: Diagnosis not present

## 2020-08-27 DIAGNOSIS — N2581 Secondary hyperparathyroidism of renal origin: Secondary | ICD-10-CM | POA: Diagnosis not present

## 2020-08-27 DIAGNOSIS — Z794 Long term (current) use of insulin: Secondary | ICD-10-CM | POA: Diagnosis not present

## 2020-08-30 DIAGNOSIS — Z992 Dependence on renal dialysis: Secondary | ICD-10-CM | POA: Diagnosis not present

## 2020-08-30 DIAGNOSIS — Z794 Long term (current) use of insulin: Secondary | ICD-10-CM | POA: Diagnosis not present

## 2020-08-30 DIAGNOSIS — N186 End stage renal disease: Secondary | ICD-10-CM | POA: Diagnosis not present

## 2020-08-30 DIAGNOSIS — D631 Anemia in chronic kidney disease: Secondary | ICD-10-CM | POA: Diagnosis not present

## 2020-08-30 DIAGNOSIS — M10379 Gout due to renal impairment, unspecified ankle and foot: Secondary | ICD-10-CM | POA: Diagnosis not present

## 2020-08-30 DIAGNOSIS — N2581 Secondary hyperparathyroidism of renal origin: Secondary | ICD-10-CM | POA: Diagnosis not present

## 2020-08-30 DIAGNOSIS — M109 Gout, unspecified: Secondary | ICD-10-CM | POA: Diagnosis not present

## 2020-08-30 DIAGNOSIS — D509 Iron deficiency anemia, unspecified: Secondary | ICD-10-CM | POA: Diagnosis not present

## 2020-08-31 DIAGNOSIS — D509 Iron deficiency anemia, unspecified: Secondary | ICD-10-CM | POA: Diagnosis not present

## 2020-08-31 DIAGNOSIS — Z794 Long term (current) use of insulin: Secondary | ICD-10-CM | POA: Diagnosis not present

## 2020-08-31 DIAGNOSIS — N2581 Secondary hyperparathyroidism of renal origin: Secondary | ICD-10-CM | POA: Diagnosis not present

## 2020-08-31 DIAGNOSIS — Z992 Dependence on renal dialysis: Secondary | ICD-10-CM | POA: Diagnosis not present

## 2020-08-31 DIAGNOSIS — D631 Anemia in chronic kidney disease: Secondary | ICD-10-CM | POA: Diagnosis not present

## 2020-08-31 DIAGNOSIS — N186 End stage renal disease: Secondary | ICD-10-CM | POA: Diagnosis not present

## 2020-09-03 DIAGNOSIS — Z794 Long term (current) use of insulin: Secondary | ICD-10-CM | POA: Diagnosis not present

## 2020-09-03 DIAGNOSIS — D631 Anemia in chronic kidney disease: Secondary | ICD-10-CM | POA: Diagnosis not present

## 2020-09-03 DIAGNOSIS — N2581 Secondary hyperparathyroidism of renal origin: Secondary | ICD-10-CM | POA: Diagnosis not present

## 2020-09-03 DIAGNOSIS — Z992 Dependence on renal dialysis: Secondary | ICD-10-CM | POA: Diagnosis not present

## 2020-09-03 DIAGNOSIS — D509 Iron deficiency anemia, unspecified: Secondary | ICD-10-CM | POA: Diagnosis not present

## 2020-09-03 DIAGNOSIS — N186 End stage renal disease: Secondary | ICD-10-CM | POA: Diagnosis not present

## 2020-09-05 DIAGNOSIS — N2581 Secondary hyperparathyroidism of renal origin: Secondary | ICD-10-CM | POA: Diagnosis not present

## 2020-09-05 DIAGNOSIS — Z794 Long term (current) use of insulin: Secondary | ICD-10-CM | POA: Diagnosis not present

## 2020-09-05 DIAGNOSIS — D509 Iron deficiency anemia, unspecified: Secondary | ICD-10-CM | POA: Diagnosis not present

## 2020-09-05 DIAGNOSIS — D631 Anemia in chronic kidney disease: Secondary | ICD-10-CM | POA: Diagnosis not present

## 2020-09-05 DIAGNOSIS — N186 End stage renal disease: Secondary | ICD-10-CM | POA: Diagnosis not present

## 2020-09-05 DIAGNOSIS — Z992 Dependence on renal dialysis: Secondary | ICD-10-CM | POA: Diagnosis not present

## 2020-09-06 DIAGNOSIS — N186 End stage renal disease: Secondary | ICD-10-CM | POA: Diagnosis not present

## 2020-09-06 DIAGNOSIS — Z794 Long term (current) use of insulin: Secondary | ICD-10-CM | POA: Diagnosis not present

## 2020-09-06 DIAGNOSIS — D509 Iron deficiency anemia, unspecified: Secondary | ICD-10-CM | POA: Diagnosis not present

## 2020-09-06 DIAGNOSIS — N2581 Secondary hyperparathyroidism of renal origin: Secondary | ICD-10-CM | POA: Diagnosis not present

## 2020-09-06 DIAGNOSIS — D631 Anemia in chronic kidney disease: Secondary | ICD-10-CM | POA: Diagnosis not present

## 2020-09-06 DIAGNOSIS — Z992 Dependence on renal dialysis: Secondary | ICD-10-CM | POA: Diagnosis not present

## 2020-09-08 DIAGNOSIS — Z794 Long term (current) use of insulin: Secondary | ICD-10-CM | POA: Diagnosis not present

## 2020-09-08 DIAGNOSIS — D631 Anemia in chronic kidney disease: Secondary | ICD-10-CM | POA: Diagnosis not present

## 2020-09-08 DIAGNOSIS — N2581 Secondary hyperparathyroidism of renal origin: Secondary | ICD-10-CM | POA: Diagnosis not present

## 2020-09-08 DIAGNOSIS — N186 End stage renal disease: Secondary | ICD-10-CM | POA: Diagnosis not present

## 2020-09-08 DIAGNOSIS — Z992 Dependence on renal dialysis: Secondary | ICD-10-CM | POA: Diagnosis not present

## 2020-09-08 DIAGNOSIS — D509 Iron deficiency anemia, unspecified: Secondary | ICD-10-CM | POA: Diagnosis not present

## 2020-09-10 DIAGNOSIS — N186 End stage renal disease: Secondary | ICD-10-CM | POA: Diagnosis not present

## 2020-09-10 DIAGNOSIS — N2581 Secondary hyperparathyroidism of renal origin: Secondary | ICD-10-CM | POA: Diagnosis not present

## 2020-09-10 DIAGNOSIS — D509 Iron deficiency anemia, unspecified: Secondary | ICD-10-CM | POA: Diagnosis not present

## 2020-09-10 DIAGNOSIS — Z794 Long term (current) use of insulin: Secondary | ICD-10-CM | POA: Diagnosis not present

## 2020-09-10 DIAGNOSIS — Z992 Dependence on renal dialysis: Secondary | ICD-10-CM | POA: Diagnosis not present

## 2020-09-10 DIAGNOSIS — D631 Anemia in chronic kidney disease: Secondary | ICD-10-CM | POA: Diagnosis not present

## 2020-09-12 DIAGNOSIS — N186 End stage renal disease: Secondary | ICD-10-CM | POA: Diagnosis not present

## 2020-09-12 DIAGNOSIS — Z992 Dependence on renal dialysis: Secondary | ICD-10-CM | POA: Diagnosis not present

## 2020-09-12 DIAGNOSIS — D509 Iron deficiency anemia, unspecified: Secondary | ICD-10-CM | POA: Diagnosis not present

## 2020-09-12 DIAGNOSIS — Z794 Long term (current) use of insulin: Secondary | ICD-10-CM | POA: Diagnosis not present

## 2020-09-12 DIAGNOSIS — D631 Anemia in chronic kidney disease: Secondary | ICD-10-CM | POA: Diagnosis not present

## 2020-09-12 DIAGNOSIS — N2581 Secondary hyperparathyroidism of renal origin: Secondary | ICD-10-CM | POA: Diagnosis not present

## 2020-09-13 DIAGNOSIS — Z794 Long term (current) use of insulin: Secondary | ICD-10-CM | POA: Diagnosis not present

## 2020-09-13 DIAGNOSIS — Z992 Dependence on renal dialysis: Secondary | ICD-10-CM | POA: Diagnosis not present

## 2020-09-13 DIAGNOSIS — D509 Iron deficiency anemia, unspecified: Secondary | ICD-10-CM | POA: Diagnosis not present

## 2020-09-13 DIAGNOSIS — N186 End stage renal disease: Secondary | ICD-10-CM | POA: Diagnosis not present

## 2020-09-13 DIAGNOSIS — D631 Anemia in chronic kidney disease: Secondary | ICD-10-CM | POA: Diagnosis not present

## 2020-09-13 DIAGNOSIS — N2581 Secondary hyperparathyroidism of renal origin: Secondary | ICD-10-CM | POA: Diagnosis not present

## 2020-09-14 DIAGNOSIS — Z794 Long term (current) use of insulin: Secondary | ICD-10-CM | POA: Diagnosis not present

## 2020-09-14 DIAGNOSIS — Z992 Dependence on renal dialysis: Secondary | ICD-10-CM | POA: Diagnosis not present

## 2020-09-14 DIAGNOSIS — N2581 Secondary hyperparathyroidism of renal origin: Secondary | ICD-10-CM | POA: Diagnosis not present

## 2020-09-14 DIAGNOSIS — D509 Iron deficiency anemia, unspecified: Secondary | ICD-10-CM | POA: Diagnosis not present

## 2020-09-14 DIAGNOSIS — N186 End stage renal disease: Secondary | ICD-10-CM | POA: Diagnosis not present

## 2020-09-14 DIAGNOSIS — D631 Anemia in chronic kidney disease: Secondary | ICD-10-CM | POA: Diagnosis not present

## 2020-09-16 DIAGNOSIS — Z992 Dependence on renal dialysis: Secondary | ICD-10-CM | POA: Diagnosis not present

## 2020-09-16 DIAGNOSIS — N2581 Secondary hyperparathyroidism of renal origin: Secondary | ICD-10-CM | POA: Diagnosis not present

## 2020-09-16 DIAGNOSIS — Z794 Long term (current) use of insulin: Secondary | ICD-10-CM | POA: Diagnosis not present

## 2020-09-16 DIAGNOSIS — N186 End stage renal disease: Secondary | ICD-10-CM | POA: Diagnosis not present

## 2020-09-16 DIAGNOSIS — D631 Anemia in chronic kidney disease: Secondary | ICD-10-CM | POA: Diagnosis not present

## 2020-09-16 DIAGNOSIS — D509 Iron deficiency anemia, unspecified: Secondary | ICD-10-CM | POA: Diagnosis not present

## 2020-09-17 DIAGNOSIS — D509 Iron deficiency anemia, unspecified: Secondary | ICD-10-CM | POA: Diagnosis not present

## 2020-09-17 DIAGNOSIS — N2581 Secondary hyperparathyroidism of renal origin: Secondary | ICD-10-CM | POA: Diagnosis not present

## 2020-09-17 DIAGNOSIS — Z992 Dependence on renal dialysis: Secondary | ICD-10-CM | POA: Diagnosis not present

## 2020-09-17 DIAGNOSIS — N186 End stage renal disease: Secondary | ICD-10-CM | POA: Diagnosis not present

## 2020-09-17 DIAGNOSIS — D631 Anemia in chronic kidney disease: Secondary | ICD-10-CM | POA: Diagnosis not present

## 2020-09-17 DIAGNOSIS — Z794 Long term (current) use of insulin: Secondary | ICD-10-CM | POA: Diagnosis not present

## 2020-09-19 DIAGNOSIS — D509 Iron deficiency anemia, unspecified: Secondary | ICD-10-CM | POA: Diagnosis not present

## 2020-09-19 DIAGNOSIS — N2581 Secondary hyperparathyroidism of renal origin: Secondary | ICD-10-CM | POA: Diagnosis not present

## 2020-09-19 DIAGNOSIS — Z794 Long term (current) use of insulin: Secondary | ICD-10-CM | POA: Diagnosis not present

## 2020-09-19 DIAGNOSIS — D631 Anemia in chronic kidney disease: Secondary | ICD-10-CM | POA: Diagnosis not present

## 2020-09-19 DIAGNOSIS — Z992 Dependence on renal dialysis: Secondary | ICD-10-CM | POA: Diagnosis not present

## 2020-09-19 DIAGNOSIS — N186 End stage renal disease: Secondary | ICD-10-CM | POA: Diagnosis not present

## 2020-09-20 DIAGNOSIS — Z794 Long term (current) use of insulin: Secondary | ICD-10-CM | POA: Diagnosis not present

## 2020-09-20 DIAGNOSIS — D631 Anemia in chronic kidney disease: Secondary | ICD-10-CM | POA: Diagnosis not present

## 2020-09-20 DIAGNOSIS — N186 End stage renal disease: Secondary | ICD-10-CM | POA: Diagnosis not present

## 2020-09-20 DIAGNOSIS — D509 Iron deficiency anemia, unspecified: Secondary | ICD-10-CM | POA: Diagnosis not present

## 2020-09-20 DIAGNOSIS — N2581 Secondary hyperparathyroidism of renal origin: Secondary | ICD-10-CM | POA: Diagnosis not present

## 2020-09-20 DIAGNOSIS — Z992 Dependence on renal dialysis: Secondary | ICD-10-CM | POA: Diagnosis not present

## 2020-09-21 DIAGNOSIS — N186 End stage renal disease: Secondary | ICD-10-CM | POA: Diagnosis not present

## 2020-09-21 DIAGNOSIS — Z992 Dependence on renal dialysis: Secondary | ICD-10-CM | POA: Diagnosis not present

## 2020-09-21 DIAGNOSIS — T8610 Unspecified complication of kidney transplant: Secondary | ICD-10-CM | POA: Diagnosis not present

## 2020-09-23 DIAGNOSIS — Z794 Long term (current) use of insulin: Secondary | ICD-10-CM | POA: Diagnosis not present

## 2020-09-23 DIAGNOSIS — R519 Headache, unspecified: Secondary | ICD-10-CM | POA: Diagnosis not present

## 2020-09-23 DIAGNOSIS — N186 End stage renal disease: Secondary | ICD-10-CM | POA: Diagnosis not present

## 2020-09-23 DIAGNOSIS — Z992 Dependence on renal dialysis: Secondary | ICD-10-CM | POA: Diagnosis not present

## 2020-09-23 DIAGNOSIS — E1122 Type 2 diabetes mellitus with diabetic chronic kidney disease: Secondary | ICD-10-CM | POA: Diagnosis not present

## 2020-09-23 DIAGNOSIS — D631 Anemia in chronic kidney disease: Secondary | ICD-10-CM | POA: Diagnosis not present

## 2020-09-23 DIAGNOSIS — N2581 Secondary hyperparathyroidism of renal origin: Secondary | ICD-10-CM | POA: Diagnosis not present

## 2020-09-23 DIAGNOSIS — D509 Iron deficiency anemia, unspecified: Secondary | ICD-10-CM | POA: Diagnosis not present

## 2020-09-23 DIAGNOSIS — R252 Cramp and spasm: Secondary | ICD-10-CM | POA: Diagnosis not present

## 2020-09-23 DIAGNOSIS — I120 Hypertensive chronic kidney disease with stage 5 chronic kidney disease or end stage renal disease: Secondary | ICD-10-CM | POA: Diagnosis not present

## 2020-09-23 DIAGNOSIS — I953 Hypotension of hemodialysis: Secondary | ICD-10-CM | POA: Diagnosis not present

## 2020-09-24 DIAGNOSIS — Z794 Long term (current) use of insulin: Secondary | ICD-10-CM | POA: Diagnosis not present

## 2020-09-24 DIAGNOSIS — Z992 Dependence on renal dialysis: Secondary | ICD-10-CM | POA: Diagnosis not present

## 2020-09-24 DIAGNOSIS — D509 Iron deficiency anemia, unspecified: Secondary | ICD-10-CM | POA: Diagnosis not present

## 2020-09-24 DIAGNOSIS — D631 Anemia in chronic kidney disease: Secondary | ICD-10-CM | POA: Diagnosis not present

## 2020-09-24 DIAGNOSIS — N2581 Secondary hyperparathyroidism of renal origin: Secondary | ICD-10-CM | POA: Diagnosis not present

## 2020-09-24 DIAGNOSIS — N186 End stage renal disease: Secondary | ICD-10-CM | POA: Diagnosis not present

## 2020-09-26 DIAGNOSIS — D631 Anemia in chronic kidney disease: Secondary | ICD-10-CM | POA: Diagnosis not present

## 2020-09-26 DIAGNOSIS — Z794 Long term (current) use of insulin: Secondary | ICD-10-CM | POA: Diagnosis not present

## 2020-09-26 DIAGNOSIS — Z992 Dependence on renal dialysis: Secondary | ICD-10-CM | POA: Diagnosis not present

## 2020-09-26 DIAGNOSIS — D509 Iron deficiency anemia, unspecified: Secondary | ICD-10-CM | POA: Diagnosis not present

## 2020-09-26 DIAGNOSIS — N2581 Secondary hyperparathyroidism of renal origin: Secondary | ICD-10-CM | POA: Diagnosis not present

## 2020-09-26 DIAGNOSIS — N186 End stage renal disease: Secondary | ICD-10-CM | POA: Diagnosis not present

## 2020-09-27 DIAGNOSIS — Z794 Long term (current) use of insulin: Secondary | ICD-10-CM | POA: Diagnosis not present

## 2020-09-27 DIAGNOSIS — D509 Iron deficiency anemia, unspecified: Secondary | ICD-10-CM | POA: Diagnosis not present

## 2020-09-27 DIAGNOSIS — N186 End stage renal disease: Secondary | ICD-10-CM | POA: Diagnosis not present

## 2020-09-27 DIAGNOSIS — Z992 Dependence on renal dialysis: Secondary | ICD-10-CM | POA: Diagnosis not present

## 2020-09-27 DIAGNOSIS — D631 Anemia in chronic kidney disease: Secondary | ICD-10-CM | POA: Diagnosis not present

## 2020-09-27 DIAGNOSIS — N2581 Secondary hyperparathyroidism of renal origin: Secondary | ICD-10-CM | POA: Diagnosis not present

## 2020-09-30 DIAGNOSIS — Z794 Long term (current) use of insulin: Secondary | ICD-10-CM | POA: Diagnosis not present

## 2020-09-30 DIAGNOSIS — D631 Anemia in chronic kidney disease: Secondary | ICD-10-CM | POA: Diagnosis not present

## 2020-09-30 DIAGNOSIS — D509 Iron deficiency anemia, unspecified: Secondary | ICD-10-CM | POA: Diagnosis not present

## 2020-09-30 DIAGNOSIS — Z992 Dependence on renal dialysis: Secondary | ICD-10-CM | POA: Diagnosis not present

## 2020-09-30 DIAGNOSIS — N186 End stage renal disease: Secondary | ICD-10-CM | POA: Diagnosis not present

## 2020-09-30 DIAGNOSIS — N2581 Secondary hyperparathyroidism of renal origin: Secondary | ICD-10-CM | POA: Diagnosis not present

## 2020-10-01 DIAGNOSIS — Z794 Long term (current) use of insulin: Secondary | ICD-10-CM | POA: Diagnosis not present

## 2020-10-01 DIAGNOSIS — N186 End stage renal disease: Secondary | ICD-10-CM | POA: Diagnosis not present

## 2020-10-01 DIAGNOSIS — N2581 Secondary hyperparathyroidism of renal origin: Secondary | ICD-10-CM | POA: Diagnosis not present

## 2020-10-01 DIAGNOSIS — D631 Anemia in chronic kidney disease: Secondary | ICD-10-CM | POA: Diagnosis not present

## 2020-10-01 DIAGNOSIS — D509 Iron deficiency anemia, unspecified: Secondary | ICD-10-CM | POA: Diagnosis not present

## 2020-10-01 DIAGNOSIS — Z992 Dependence on renal dialysis: Secondary | ICD-10-CM | POA: Diagnosis not present

## 2020-10-03 DIAGNOSIS — M109 Gout, unspecified: Secondary | ICD-10-CM | POA: Diagnosis not present

## 2020-10-03 DIAGNOSIS — M10379 Gout due to renal impairment, unspecified ankle and foot: Secondary | ICD-10-CM | POA: Diagnosis not present

## 2020-10-03 DIAGNOSIS — D509 Iron deficiency anemia, unspecified: Secondary | ICD-10-CM | POA: Diagnosis not present

## 2020-10-03 DIAGNOSIS — N2581 Secondary hyperparathyroidism of renal origin: Secondary | ICD-10-CM | POA: Diagnosis not present

## 2020-10-03 DIAGNOSIS — D631 Anemia in chronic kidney disease: Secondary | ICD-10-CM | POA: Diagnosis not present

## 2020-10-03 DIAGNOSIS — E1129 Type 2 diabetes mellitus with other diabetic kidney complication: Secondary | ICD-10-CM | POA: Diagnosis not present

## 2020-10-03 DIAGNOSIS — Z992 Dependence on renal dialysis: Secondary | ICD-10-CM | POA: Diagnosis not present

## 2020-10-03 DIAGNOSIS — N186 End stage renal disease: Secondary | ICD-10-CM | POA: Diagnosis not present

## 2020-10-03 DIAGNOSIS — Z794 Long term (current) use of insulin: Secondary | ICD-10-CM | POA: Diagnosis not present

## 2020-10-04 DIAGNOSIS — Z794 Long term (current) use of insulin: Secondary | ICD-10-CM | POA: Diagnosis not present

## 2020-10-04 DIAGNOSIS — N2581 Secondary hyperparathyroidism of renal origin: Secondary | ICD-10-CM | POA: Diagnosis not present

## 2020-10-04 DIAGNOSIS — N186 End stage renal disease: Secondary | ICD-10-CM | POA: Diagnosis not present

## 2020-10-04 DIAGNOSIS — D509 Iron deficiency anemia, unspecified: Secondary | ICD-10-CM | POA: Diagnosis not present

## 2020-10-04 DIAGNOSIS — Z992 Dependence on renal dialysis: Secondary | ICD-10-CM | POA: Diagnosis not present

## 2020-10-04 DIAGNOSIS — D631 Anemia in chronic kidney disease: Secondary | ICD-10-CM | POA: Diagnosis not present

## 2020-10-08 DIAGNOSIS — Z992 Dependence on renal dialysis: Secondary | ICD-10-CM | POA: Diagnosis not present

## 2020-10-08 DIAGNOSIS — D509 Iron deficiency anemia, unspecified: Secondary | ICD-10-CM | POA: Diagnosis not present

## 2020-10-08 DIAGNOSIS — N2581 Secondary hyperparathyroidism of renal origin: Secondary | ICD-10-CM | POA: Diagnosis not present

## 2020-10-08 DIAGNOSIS — D631 Anemia in chronic kidney disease: Secondary | ICD-10-CM | POA: Diagnosis not present

## 2020-10-08 DIAGNOSIS — Z794 Long term (current) use of insulin: Secondary | ICD-10-CM | POA: Diagnosis not present

## 2020-10-08 DIAGNOSIS — N186 End stage renal disease: Secondary | ICD-10-CM | POA: Diagnosis not present

## 2020-10-10 DIAGNOSIS — D509 Iron deficiency anemia, unspecified: Secondary | ICD-10-CM | POA: Diagnosis not present

## 2020-10-10 DIAGNOSIS — D631 Anemia in chronic kidney disease: Secondary | ICD-10-CM | POA: Diagnosis not present

## 2020-10-10 DIAGNOSIS — N2581 Secondary hyperparathyroidism of renal origin: Secondary | ICD-10-CM | POA: Diagnosis not present

## 2020-10-10 DIAGNOSIS — Z992 Dependence on renal dialysis: Secondary | ICD-10-CM | POA: Diagnosis not present

## 2020-10-10 DIAGNOSIS — Z794 Long term (current) use of insulin: Secondary | ICD-10-CM | POA: Diagnosis not present

## 2020-10-10 DIAGNOSIS — N186 End stage renal disease: Secondary | ICD-10-CM | POA: Diagnosis not present

## 2020-10-11 DIAGNOSIS — N186 End stage renal disease: Secondary | ICD-10-CM | POA: Diagnosis not present

## 2020-10-11 DIAGNOSIS — D631 Anemia in chronic kidney disease: Secondary | ICD-10-CM | POA: Diagnosis not present

## 2020-10-11 DIAGNOSIS — Z794 Long term (current) use of insulin: Secondary | ICD-10-CM | POA: Diagnosis not present

## 2020-10-11 DIAGNOSIS — D509 Iron deficiency anemia, unspecified: Secondary | ICD-10-CM | POA: Diagnosis not present

## 2020-10-11 DIAGNOSIS — Z992 Dependence on renal dialysis: Secondary | ICD-10-CM | POA: Diagnosis not present

## 2020-10-11 DIAGNOSIS — N2581 Secondary hyperparathyroidism of renal origin: Secondary | ICD-10-CM | POA: Diagnosis not present

## 2020-10-14 DIAGNOSIS — D631 Anemia in chronic kidney disease: Secondary | ICD-10-CM | POA: Diagnosis not present

## 2020-10-14 DIAGNOSIS — N186 End stage renal disease: Secondary | ICD-10-CM | POA: Diagnosis not present

## 2020-10-14 DIAGNOSIS — Z794 Long term (current) use of insulin: Secondary | ICD-10-CM | POA: Diagnosis not present

## 2020-10-14 DIAGNOSIS — N2581 Secondary hyperparathyroidism of renal origin: Secondary | ICD-10-CM | POA: Diagnosis not present

## 2020-10-14 DIAGNOSIS — Z992 Dependence on renal dialysis: Secondary | ICD-10-CM | POA: Diagnosis not present

## 2020-10-14 DIAGNOSIS — D509 Iron deficiency anemia, unspecified: Secondary | ICD-10-CM | POA: Diagnosis not present

## 2020-10-17 DIAGNOSIS — D509 Iron deficiency anemia, unspecified: Secondary | ICD-10-CM | POA: Diagnosis not present

## 2020-10-17 DIAGNOSIS — Z992 Dependence on renal dialysis: Secondary | ICD-10-CM | POA: Diagnosis not present

## 2020-10-17 DIAGNOSIS — N2581 Secondary hyperparathyroidism of renal origin: Secondary | ICD-10-CM | POA: Diagnosis not present

## 2020-10-17 DIAGNOSIS — Z794 Long term (current) use of insulin: Secondary | ICD-10-CM | POA: Diagnosis not present

## 2020-10-17 DIAGNOSIS — N186 End stage renal disease: Secondary | ICD-10-CM | POA: Diagnosis not present

## 2020-10-17 DIAGNOSIS — D631 Anemia in chronic kidney disease: Secondary | ICD-10-CM | POA: Diagnosis not present

## 2020-10-18 DIAGNOSIS — Z992 Dependence on renal dialysis: Secondary | ICD-10-CM | POA: Diagnosis not present

## 2020-10-18 DIAGNOSIS — Z794 Long term (current) use of insulin: Secondary | ICD-10-CM | POA: Diagnosis not present

## 2020-10-18 DIAGNOSIS — D509 Iron deficiency anemia, unspecified: Secondary | ICD-10-CM | POA: Diagnosis not present

## 2020-10-18 DIAGNOSIS — N2581 Secondary hyperparathyroidism of renal origin: Secondary | ICD-10-CM | POA: Diagnosis not present

## 2020-10-18 DIAGNOSIS — D631 Anemia in chronic kidney disease: Secondary | ICD-10-CM | POA: Diagnosis not present

## 2020-10-18 DIAGNOSIS — N186 End stage renal disease: Secondary | ICD-10-CM | POA: Diagnosis not present

## 2020-10-19 DIAGNOSIS — Z794 Long term (current) use of insulin: Secondary | ICD-10-CM | POA: Diagnosis not present

## 2020-10-19 DIAGNOSIS — Z992 Dependence on renal dialysis: Secondary | ICD-10-CM | POA: Diagnosis not present

## 2020-10-19 DIAGNOSIS — D631 Anemia in chronic kidney disease: Secondary | ICD-10-CM | POA: Diagnosis not present

## 2020-10-19 DIAGNOSIS — D509 Iron deficiency anemia, unspecified: Secondary | ICD-10-CM | POA: Diagnosis not present

## 2020-10-19 DIAGNOSIS — N2581 Secondary hyperparathyroidism of renal origin: Secondary | ICD-10-CM | POA: Diagnosis not present

## 2020-10-19 DIAGNOSIS — N186 End stage renal disease: Secondary | ICD-10-CM | POA: Diagnosis not present

## 2020-10-20 DIAGNOSIS — Z794 Long term (current) use of insulin: Secondary | ICD-10-CM | POA: Diagnosis not present

## 2020-10-20 DIAGNOSIS — Z992 Dependence on renal dialysis: Secondary | ICD-10-CM | POA: Diagnosis not present

## 2020-10-20 DIAGNOSIS — N186 End stage renal disease: Secondary | ICD-10-CM | POA: Diagnosis not present

## 2020-10-20 DIAGNOSIS — D509 Iron deficiency anemia, unspecified: Secondary | ICD-10-CM | POA: Diagnosis not present

## 2020-10-20 DIAGNOSIS — N2581 Secondary hyperparathyroidism of renal origin: Secondary | ICD-10-CM | POA: Diagnosis not present

## 2020-10-20 DIAGNOSIS — D631 Anemia in chronic kidney disease: Secondary | ICD-10-CM | POA: Diagnosis not present

## 2020-10-21 DIAGNOSIS — Z992 Dependence on renal dialysis: Secondary | ICD-10-CM | POA: Diagnosis not present

## 2020-10-21 DIAGNOSIS — N186 End stage renal disease: Secondary | ICD-10-CM | POA: Diagnosis not present

## 2020-10-21 DIAGNOSIS — T8610 Unspecified complication of kidney transplant: Secondary | ICD-10-CM | POA: Diagnosis not present

## 2020-10-22 DIAGNOSIS — R519 Headache, unspecified: Secondary | ICD-10-CM | POA: Diagnosis not present

## 2020-10-22 DIAGNOSIS — I120 Hypertensive chronic kidney disease with stage 5 chronic kidney disease or end stage renal disease: Secondary | ICD-10-CM | POA: Diagnosis not present

## 2020-10-22 DIAGNOSIS — N2581 Secondary hyperparathyroidism of renal origin: Secondary | ICD-10-CM | POA: Diagnosis not present

## 2020-10-22 DIAGNOSIS — R252 Cramp and spasm: Secondary | ICD-10-CM | POA: Diagnosis not present

## 2020-10-22 DIAGNOSIS — N186 End stage renal disease: Secondary | ICD-10-CM | POA: Diagnosis not present

## 2020-10-22 DIAGNOSIS — E1122 Type 2 diabetes mellitus with diabetic chronic kidney disease: Secondary | ICD-10-CM | POA: Diagnosis not present

## 2020-10-22 DIAGNOSIS — Z992 Dependence on renal dialysis: Secondary | ICD-10-CM | POA: Diagnosis not present

## 2020-10-22 DIAGNOSIS — I953 Hypotension of hemodialysis: Secondary | ICD-10-CM | POA: Diagnosis not present

## 2020-10-22 DIAGNOSIS — D509 Iron deficiency anemia, unspecified: Secondary | ICD-10-CM | POA: Diagnosis not present

## 2020-10-22 DIAGNOSIS — Z794 Long term (current) use of insulin: Secondary | ICD-10-CM | POA: Diagnosis not present

## 2020-10-23 DIAGNOSIS — A630 Anogenital (venereal) warts: Secondary | ICD-10-CM | POA: Insufficient documentation

## 2020-10-24 DIAGNOSIS — Z794 Long term (current) use of insulin: Secondary | ICD-10-CM | POA: Diagnosis not present

## 2020-10-24 DIAGNOSIS — N2581 Secondary hyperparathyroidism of renal origin: Secondary | ICD-10-CM | POA: Diagnosis not present

## 2020-10-24 DIAGNOSIS — R519 Headache, unspecified: Secondary | ICD-10-CM | POA: Diagnosis not present

## 2020-10-24 DIAGNOSIS — Z992 Dependence on renal dialysis: Secondary | ICD-10-CM | POA: Diagnosis not present

## 2020-10-24 DIAGNOSIS — N186 End stage renal disease: Secondary | ICD-10-CM | POA: Diagnosis not present

## 2020-10-24 DIAGNOSIS — D509 Iron deficiency anemia, unspecified: Secondary | ICD-10-CM | POA: Diagnosis not present

## 2020-10-25 DIAGNOSIS — Z794 Long term (current) use of insulin: Secondary | ICD-10-CM | POA: Diagnosis not present

## 2020-10-25 DIAGNOSIS — Z992 Dependence on renal dialysis: Secondary | ICD-10-CM | POA: Diagnosis not present

## 2020-10-25 DIAGNOSIS — R519 Headache, unspecified: Secondary | ICD-10-CM | POA: Diagnosis not present

## 2020-10-25 DIAGNOSIS — N186 End stage renal disease: Secondary | ICD-10-CM | POA: Diagnosis not present

## 2020-10-25 DIAGNOSIS — D509 Iron deficiency anemia, unspecified: Secondary | ICD-10-CM | POA: Diagnosis not present

## 2020-10-25 DIAGNOSIS — N2581 Secondary hyperparathyroidism of renal origin: Secondary | ICD-10-CM | POA: Diagnosis not present

## 2020-10-27 DIAGNOSIS — N2581 Secondary hyperparathyroidism of renal origin: Secondary | ICD-10-CM | POA: Diagnosis not present

## 2020-10-27 DIAGNOSIS — R519 Headache, unspecified: Secondary | ICD-10-CM | POA: Diagnosis not present

## 2020-10-27 DIAGNOSIS — N186 End stage renal disease: Secondary | ICD-10-CM | POA: Diagnosis not present

## 2020-10-27 DIAGNOSIS — Z794 Long term (current) use of insulin: Secondary | ICD-10-CM | POA: Diagnosis not present

## 2020-10-27 DIAGNOSIS — Z992 Dependence on renal dialysis: Secondary | ICD-10-CM | POA: Diagnosis not present

## 2020-10-27 DIAGNOSIS — D509 Iron deficiency anemia, unspecified: Secondary | ICD-10-CM | POA: Diagnosis not present

## 2020-10-29 DIAGNOSIS — R519 Headache, unspecified: Secondary | ICD-10-CM | POA: Diagnosis not present

## 2020-10-29 DIAGNOSIS — M109 Gout, unspecified: Secondary | ICD-10-CM | POA: Diagnosis not present

## 2020-10-29 DIAGNOSIS — N186 End stage renal disease: Secondary | ICD-10-CM | POA: Diagnosis not present

## 2020-10-29 DIAGNOSIS — Z992 Dependence on renal dialysis: Secondary | ICD-10-CM | POA: Diagnosis not present

## 2020-10-29 DIAGNOSIS — Z794 Long term (current) use of insulin: Secondary | ICD-10-CM | POA: Diagnosis not present

## 2020-10-29 DIAGNOSIS — N2581 Secondary hyperparathyroidism of renal origin: Secondary | ICD-10-CM | POA: Diagnosis not present

## 2020-10-29 DIAGNOSIS — D509 Iron deficiency anemia, unspecified: Secondary | ICD-10-CM | POA: Diagnosis not present

## 2020-10-29 DIAGNOSIS — M10379 Gout due to renal impairment, unspecified ankle and foot: Secondary | ICD-10-CM | POA: Diagnosis not present

## 2020-10-31 DIAGNOSIS — Z992 Dependence on renal dialysis: Secondary | ICD-10-CM | POA: Diagnosis not present

## 2020-10-31 DIAGNOSIS — N2581 Secondary hyperparathyroidism of renal origin: Secondary | ICD-10-CM | POA: Diagnosis not present

## 2020-10-31 DIAGNOSIS — R519 Headache, unspecified: Secondary | ICD-10-CM | POA: Diagnosis not present

## 2020-10-31 DIAGNOSIS — Z794 Long term (current) use of insulin: Secondary | ICD-10-CM | POA: Diagnosis not present

## 2020-10-31 DIAGNOSIS — N186 End stage renal disease: Secondary | ICD-10-CM | POA: Diagnosis not present

## 2020-10-31 DIAGNOSIS — D509 Iron deficiency anemia, unspecified: Secondary | ICD-10-CM | POA: Diagnosis not present

## 2020-11-01 DIAGNOSIS — Z992 Dependence on renal dialysis: Secondary | ICD-10-CM | POA: Diagnosis not present

## 2020-11-01 DIAGNOSIS — N2581 Secondary hyperparathyroidism of renal origin: Secondary | ICD-10-CM | POA: Diagnosis not present

## 2020-11-01 DIAGNOSIS — D509 Iron deficiency anemia, unspecified: Secondary | ICD-10-CM | POA: Diagnosis not present

## 2020-11-01 DIAGNOSIS — Z794 Long term (current) use of insulin: Secondary | ICD-10-CM | POA: Diagnosis not present

## 2020-11-01 DIAGNOSIS — N186 End stage renal disease: Secondary | ICD-10-CM | POA: Diagnosis not present

## 2020-11-01 DIAGNOSIS — R519 Headache, unspecified: Secondary | ICD-10-CM | POA: Diagnosis not present

## 2020-11-04 DIAGNOSIS — R519 Headache, unspecified: Secondary | ICD-10-CM | POA: Diagnosis not present

## 2020-11-04 DIAGNOSIS — Z992 Dependence on renal dialysis: Secondary | ICD-10-CM | POA: Diagnosis not present

## 2020-11-04 DIAGNOSIS — N186 End stage renal disease: Secondary | ICD-10-CM | POA: Diagnosis not present

## 2020-11-04 DIAGNOSIS — Z794 Long term (current) use of insulin: Secondary | ICD-10-CM | POA: Diagnosis not present

## 2020-11-04 DIAGNOSIS — D509 Iron deficiency anemia, unspecified: Secondary | ICD-10-CM | POA: Diagnosis not present

## 2020-11-04 DIAGNOSIS — N2581 Secondary hyperparathyroidism of renal origin: Secondary | ICD-10-CM | POA: Diagnosis not present

## 2020-11-05 DIAGNOSIS — D509 Iron deficiency anemia, unspecified: Secondary | ICD-10-CM | POA: Diagnosis not present

## 2020-11-05 DIAGNOSIS — N2581 Secondary hyperparathyroidism of renal origin: Secondary | ICD-10-CM | POA: Diagnosis not present

## 2020-11-05 DIAGNOSIS — N186 End stage renal disease: Secondary | ICD-10-CM | POA: Diagnosis not present

## 2020-11-05 DIAGNOSIS — R519 Headache, unspecified: Secondary | ICD-10-CM | POA: Diagnosis not present

## 2020-11-05 DIAGNOSIS — Z992 Dependence on renal dialysis: Secondary | ICD-10-CM | POA: Diagnosis not present

## 2020-11-05 DIAGNOSIS — Z794 Long term (current) use of insulin: Secondary | ICD-10-CM | POA: Diagnosis not present

## 2020-11-07 DIAGNOSIS — N186 End stage renal disease: Secondary | ICD-10-CM | POA: Diagnosis not present

## 2020-11-07 DIAGNOSIS — D509 Iron deficiency anemia, unspecified: Secondary | ICD-10-CM | POA: Diagnosis not present

## 2020-11-07 DIAGNOSIS — R519 Headache, unspecified: Secondary | ICD-10-CM | POA: Diagnosis not present

## 2020-11-07 DIAGNOSIS — Z794 Long term (current) use of insulin: Secondary | ICD-10-CM | POA: Diagnosis not present

## 2020-11-07 DIAGNOSIS — Z992 Dependence on renal dialysis: Secondary | ICD-10-CM | POA: Diagnosis not present

## 2020-11-07 DIAGNOSIS — N2581 Secondary hyperparathyroidism of renal origin: Secondary | ICD-10-CM | POA: Diagnosis not present

## 2020-11-08 DIAGNOSIS — Z992 Dependence on renal dialysis: Secondary | ICD-10-CM | POA: Diagnosis not present

## 2020-11-08 DIAGNOSIS — N186 End stage renal disease: Secondary | ICD-10-CM | POA: Diagnosis not present

## 2020-11-08 DIAGNOSIS — D509 Iron deficiency anemia, unspecified: Secondary | ICD-10-CM | POA: Diagnosis not present

## 2020-11-08 DIAGNOSIS — R519 Headache, unspecified: Secondary | ICD-10-CM | POA: Diagnosis not present

## 2020-11-08 DIAGNOSIS — Z794 Long term (current) use of insulin: Secondary | ICD-10-CM | POA: Diagnosis not present

## 2020-11-08 DIAGNOSIS — N2581 Secondary hyperparathyroidism of renal origin: Secondary | ICD-10-CM | POA: Diagnosis not present

## 2020-11-12 DIAGNOSIS — Z794 Long term (current) use of insulin: Secondary | ICD-10-CM | POA: Diagnosis not present

## 2020-11-12 DIAGNOSIS — N2581 Secondary hyperparathyroidism of renal origin: Secondary | ICD-10-CM | POA: Diagnosis not present

## 2020-11-12 DIAGNOSIS — Z992 Dependence on renal dialysis: Secondary | ICD-10-CM | POA: Diagnosis not present

## 2020-11-12 DIAGNOSIS — R519 Headache, unspecified: Secondary | ICD-10-CM | POA: Diagnosis not present

## 2020-11-12 DIAGNOSIS — N186 End stage renal disease: Secondary | ICD-10-CM | POA: Diagnosis not present

## 2020-11-12 DIAGNOSIS — D509 Iron deficiency anemia, unspecified: Secondary | ICD-10-CM | POA: Diagnosis not present

## 2020-11-13 ENCOUNTER — Other Ambulatory Visit: Payer: Self-pay

## 2020-11-13 ENCOUNTER — Ambulatory Visit (INDEPENDENT_AMBULATORY_CARE_PROVIDER_SITE_OTHER): Payer: Medicare Other | Admitting: Physician Assistant

## 2020-11-13 VITALS — BP 106/59 | HR 60 | Temp 97.8°F | Resp 20 | Ht 66.0 in | Wt 154.3 lb

## 2020-11-13 DIAGNOSIS — Z86010 Personal history of colonic polyps: Secondary | ICD-10-CM | POA: Insufficient documentation

## 2020-11-13 DIAGNOSIS — E0590 Thyrotoxicosis, unspecified without thyrotoxic crisis or storm: Secondary | ICD-10-CM | POA: Insufficient documentation

## 2020-11-13 DIAGNOSIS — Z992 Dependence on renal dialysis: Secondary | ICD-10-CM

## 2020-11-13 DIAGNOSIS — N186 End stage renal disease: Secondary | ICD-10-CM

## 2020-11-13 DIAGNOSIS — Z800 Family history of malignant neoplasm of digestive organs: Secondary | ICD-10-CM | POA: Insufficient documentation

## 2020-11-13 DIAGNOSIS — M8580 Other specified disorders of bone density and structure, unspecified site: Secondary | ICD-10-CM | POA: Insufficient documentation

## 2020-11-13 NOTE — Progress Notes (Signed)
POST OPERATIVE DIALYSIS ACCESS OFFICE NOTE    CC:  dialysis access concern  HPI:  This is a 65 y.o. female who is s/p left upper extremity AVF.  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  She had previously undergone fistulogram with balloon angioplasty in November 2020 and again in April 2021 with Dr. Donzetta Matters.  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  At her last encounter on Nov 18, 2019, the patient and her husband discussed with Dr. 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  The patient states she was expecting to discuss this directly with Dr. Donzetta Matters today.  I explained he was not in the office but offered to proceed with scheduling her revision today.  She denies any left upper extremity pain, problems with cannulation or flow rates.  She denies bleeding from needle sticks.  She dialyzes at home.   Allergies  Allergen Reactions  . Sucroferric Oxyhydroxide     Other reaction(s): Flushing (Red Skin)  . Other Hives    tape  . Phoslo  [Calcium Acetate] Rash    Current Outpatient Medications  Medication Sig Dispense Refill  . acetaminophen (TYLENOL) 500 MG tablet Take 500-1,000 mg by mouth every 8 (eight) hours as needed for mild pain or headache (depends on pain if takes 1-2 tablets).    Marland Kitchen amLODipine (NORVASC) 10 MG tablet Take 10 mg by mouth every evening.     Marland Kitchen amoxicillin (AMOXIL) 500 MG capsule Take 2,000 mg by mouth See admin instructions. Take 4 capsules (2000 mg) by mouth 1 hour prior to dental work    . B-D ULTRAFINE III SHORT PEN 31G X 8 MM MISC Use twice daily as directed  0  . calcitRIOL (ROCALTROL) 0.5 MCG capsule Take 1.5 mcg by mouth daily.     . cinacalcet (SENSIPAR) 30 MG tablet Take 30 mg by mouth daily.    Marland Kitchen ethyl  chloride spray Apply topically.    Marland Kitchen ezetimibe-simvastatin (VYTORIN) 10-20 MG per tablet Take 1 tablet by mouth at bedtime.    . ferric citrate (AURYXIA) 1 GM 210 MG(Fe) tablet Take 210 mg by mouth 3 (three) times daily with meals.     . fluticasone (FLONASE) 50 MCG/ACT nasal spray 2 sprays each nostril daily for sinus    . glucose blood (ONETOUCH VERIO) test strip Check sugars up to 3 times a day Dx: Diabetes on insulin tx with labile sugars due to ESRD/dialysis E11.319    . hydrALAZINE (APRESOLINE) 25 MG tablet Take by mouth.    Marland Kitchen HYDROcodone-acetaminophen (NORCO) 5-325 MG tablet Take 1 tablet by mouth every 6 (six) hours as needed for moderate pain. 20 tablet 0  . iron sucrose in sodium chloride 0.9 % 100 mL Iron Sucrose (Venofer) Self Administer at Home    . labetalol (NORMODYNE) 200 MG tablet Take 1 tablet (200 mg total) by mouth 2 (two) times daily. 60 tablet 0  . losartan (COZAAR) 100 MG tablet Take 1 tablet by mouth at bedtime.    . Methoxy PEG-Epoetin Beta (MIRCERA IJ) Inject into the skin.    Marland Kitchen olmesartan (BENICAR) 40 MG tablet 1 tablet    . omeprazole (PRILOSEC) 40 MG capsule Take 40 mg by mouth daily.  0  . OneTouch Delica Lancets 96E MISC Check sugar 3 times  a day    . predniSONE (DELTASONE) 5 MG tablet Take 5 mg by mouth daily.    Marland Kitchen spironolactone (ALDACTONE) 25 MG tablet Take 1 tablet by mouth daily.    Nelva Nay MAX SOLOSTAR 300 UNIT/ML SOPN Inject 8 Units into the skin every evening.     No current facility-administered medications for this visit.     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  There is a good bruit and thrill in the fistula.  2+ radial pulse.  Good hand strength.      Assessment/Plan:   65 year old female status post revision of left arm AV fistula with large aneurysmal dilatation.  She is nearing readiness for revision of this area of her fistula but desires to wait till August.  She requests rescheduling her appointment to discuss surgical plans with Dr. Donzetta Matters directly.  Make arrangements for follow-up with him at her convenience.  Barbie Banner, PA-C 11/13/2020 9:38 AM [facility] [phone]  Clinic MD: Dr. Carlis Abbott

## 2020-11-14 DIAGNOSIS — Z794 Long term (current) use of insulin: Secondary | ICD-10-CM | POA: Diagnosis not present

## 2020-11-14 DIAGNOSIS — R519 Headache, unspecified: Secondary | ICD-10-CM | POA: Diagnosis not present

## 2020-11-14 DIAGNOSIS — N186 End stage renal disease: Secondary | ICD-10-CM | POA: Diagnosis not present

## 2020-11-14 DIAGNOSIS — D509 Iron deficiency anemia, unspecified: Secondary | ICD-10-CM | POA: Diagnosis not present

## 2020-11-14 DIAGNOSIS — Z992 Dependence on renal dialysis: Secondary | ICD-10-CM | POA: Diagnosis not present

## 2020-11-14 DIAGNOSIS — N2581 Secondary hyperparathyroidism of renal origin: Secondary | ICD-10-CM | POA: Diagnosis not present

## 2020-11-15 DIAGNOSIS — R519 Headache, unspecified: Secondary | ICD-10-CM | POA: Diagnosis not present

## 2020-11-15 DIAGNOSIS — D509 Iron deficiency anemia, unspecified: Secondary | ICD-10-CM | POA: Diagnosis not present

## 2020-11-15 DIAGNOSIS — N2581 Secondary hyperparathyroidism of renal origin: Secondary | ICD-10-CM | POA: Diagnosis not present

## 2020-11-15 DIAGNOSIS — Z992 Dependence on renal dialysis: Secondary | ICD-10-CM | POA: Diagnosis not present

## 2020-11-15 DIAGNOSIS — Z794 Long term (current) use of insulin: Secondary | ICD-10-CM | POA: Diagnosis not present

## 2020-11-15 DIAGNOSIS — N186 End stage renal disease: Secondary | ICD-10-CM | POA: Diagnosis not present

## 2020-11-16 DIAGNOSIS — N2581 Secondary hyperparathyroidism of renal origin: Secondary | ICD-10-CM | POA: Diagnosis not present

## 2020-11-16 DIAGNOSIS — Z992 Dependence on renal dialysis: Secondary | ICD-10-CM | POA: Diagnosis not present

## 2020-11-16 DIAGNOSIS — D509 Iron deficiency anemia, unspecified: Secondary | ICD-10-CM | POA: Diagnosis not present

## 2020-11-16 DIAGNOSIS — Z794 Long term (current) use of insulin: Secondary | ICD-10-CM | POA: Diagnosis not present

## 2020-11-16 DIAGNOSIS — N186 End stage renal disease: Secondary | ICD-10-CM | POA: Diagnosis not present

## 2020-11-16 DIAGNOSIS — R519 Headache, unspecified: Secondary | ICD-10-CM | POA: Diagnosis not present

## 2020-11-19 DIAGNOSIS — N186 End stage renal disease: Secondary | ICD-10-CM | POA: Diagnosis not present

## 2020-11-19 DIAGNOSIS — R519 Headache, unspecified: Secondary | ICD-10-CM | POA: Diagnosis not present

## 2020-11-19 DIAGNOSIS — D509 Iron deficiency anemia, unspecified: Secondary | ICD-10-CM | POA: Diagnosis not present

## 2020-11-19 DIAGNOSIS — N2581 Secondary hyperparathyroidism of renal origin: Secondary | ICD-10-CM | POA: Diagnosis not present

## 2020-11-19 DIAGNOSIS — Z794 Long term (current) use of insulin: Secondary | ICD-10-CM | POA: Diagnosis not present

## 2020-11-19 DIAGNOSIS — Z992 Dependence on renal dialysis: Secondary | ICD-10-CM | POA: Diagnosis not present

## 2020-11-21 DIAGNOSIS — N186 End stage renal disease: Secondary | ICD-10-CM | POA: Diagnosis not present

## 2020-11-21 DIAGNOSIS — I120 Hypertensive chronic kidney disease with stage 5 chronic kidney disease or end stage renal disease: Secondary | ICD-10-CM | POA: Diagnosis not present

## 2020-11-21 DIAGNOSIS — Z992 Dependence on renal dialysis: Secondary | ICD-10-CM | POA: Diagnosis not present

## 2020-11-21 DIAGNOSIS — R252 Cramp and spasm: Secondary | ICD-10-CM | POA: Diagnosis not present

## 2020-11-21 DIAGNOSIS — E1122 Type 2 diabetes mellitus with diabetic chronic kidney disease: Secondary | ICD-10-CM | POA: Diagnosis not present

## 2020-11-21 DIAGNOSIS — D509 Iron deficiency anemia, unspecified: Secondary | ICD-10-CM | POA: Diagnosis not present

## 2020-11-21 DIAGNOSIS — I953 Hypotension of hemodialysis: Secondary | ICD-10-CM | POA: Diagnosis not present

## 2020-11-21 DIAGNOSIS — N2581 Secondary hyperparathyroidism of renal origin: Secondary | ICD-10-CM | POA: Diagnosis not present

## 2020-11-21 DIAGNOSIS — R519 Headache, unspecified: Secondary | ICD-10-CM | POA: Diagnosis not present

## 2020-11-21 DIAGNOSIS — Z794 Long term (current) use of insulin: Secondary | ICD-10-CM | POA: Diagnosis not present

## 2020-11-22 DIAGNOSIS — N2581 Secondary hyperparathyroidism of renal origin: Secondary | ICD-10-CM | POA: Diagnosis not present

## 2020-11-22 DIAGNOSIS — N186 End stage renal disease: Secondary | ICD-10-CM | POA: Diagnosis not present

## 2020-11-22 DIAGNOSIS — D509 Iron deficiency anemia, unspecified: Secondary | ICD-10-CM | POA: Diagnosis not present

## 2020-11-22 DIAGNOSIS — R519 Headache, unspecified: Secondary | ICD-10-CM | POA: Diagnosis not present

## 2020-11-22 DIAGNOSIS — Z992 Dependence on renal dialysis: Secondary | ICD-10-CM | POA: Diagnosis not present

## 2020-11-22 DIAGNOSIS — Z794 Long term (current) use of insulin: Secondary | ICD-10-CM | POA: Diagnosis not present

## 2020-11-23 ENCOUNTER — Ambulatory Visit: Payer: Medicare Other | Admitting: Vascular Surgery

## 2020-11-23 DIAGNOSIS — Z794 Long term (current) use of insulin: Secondary | ICD-10-CM | POA: Diagnosis not present

## 2020-11-23 DIAGNOSIS — D509 Iron deficiency anemia, unspecified: Secondary | ICD-10-CM | POA: Diagnosis not present

## 2020-11-23 DIAGNOSIS — N2581 Secondary hyperparathyroidism of renal origin: Secondary | ICD-10-CM | POA: Diagnosis not present

## 2020-11-23 DIAGNOSIS — N186 End stage renal disease: Secondary | ICD-10-CM | POA: Diagnosis not present

## 2020-11-23 DIAGNOSIS — R519 Headache, unspecified: Secondary | ICD-10-CM | POA: Diagnosis not present

## 2020-11-23 DIAGNOSIS — Z992 Dependence on renal dialysis: Secondary | ICD-10-CM | POA: Diagnosis not present

## 2020-11-26 DIAGNOSIS — D509 Iron deficiency anemia, unspecified: Secondary | ICD-10-CM | POA: Diagnosis not present

## 2020-11-26 DIAGNOSIS — Z992 Dependence on renal dialysis: Secondary | ICD-10-CM | POA: Diagnosis not present

## 2020-11-26 DIAGNOSIS — N2581 Secondary hyperparathyroidism of renal origin: Secondary | ICD-10-CM | POA: Diagnosis not present

## 2020-11-26 DIAGNOSIS — R519 Headache, unspecified: Secondary | ICD-10-CM | POA: Diagnosis not present

## 2020-11-26 DIAGNOSIS — Z794 Long term (current) use of insulin: Secondary | ICD-10-CM | POA: Diagnosis not present

## 2020-11-26 DIAGNOSIS — N186 End stage renal disease: Secondary | ICD-10-CM | POA: Diagnosis not present

## 2020-11-28 DIAGNOSIS — Z794 Long term (current) use of insulin: Secondary | ICD-10-CM | POA: Diagnosis not present

## 2020-11-28 DIAGNOSIS — M10379 Gout due to renal impairment, unspecified ankle and foot: Secondary | ICD-10-CM | POA: Diagnosis not present

## 2020-11-28 DIAGNOSIS — Z87891 Personal history of nicotine dependence: Secondary | ICD-10-CM | POA: Diagnosis not present

## 2020-11-28 DIAGNOSIS — D84821 Immunodeficiency due to drugs: Secondary | ICD-10-CM | POA: Diagnosis not present

## 2020-11-28 DIAGNOSIS — I120 Hypertensive chronic kidney disease with stage 5 chronic kidney disease or end stage renal disease: Secondary | ICD-10-CM | POA: Diagnosis not present

## 2020-11-28 DIAGNOSIS — Z4822 Encounter for aftercare following kidney transplant: Secondary | ICD-10-CM | POA: Diagnosis not present

## 2020-11-28 DIAGNOSIS — Z940 Kidney transplant status: Secondary | ICD-10-CM | POA: Diagnosis not present

## 2020-11-28 DIAGNOSIS — N261 Atrophy of kidney (terminal): Secondary | ICD-10-CM | POA: Diagnosis not present

## 2020-11-28 DIAGNOSIS — N186 End stage renal disease: Secondary | ICD-10-CM | POA: Diagnosis not present

## 2020-11-28 DIAGNOSIS — T8612 Kidney transplant failure: Secondary | ICD-10-CM | POA: Diagnosis not present

## 2020-11-28 DIAGNOSIS — Z992 Dependence on renal dialysis: Secondary | ICD-10-CM | POA: Diagnosis not present

## 2020-11-28 DIAGNOSIS — I10 Essential (primary) hypertension: Secondary | ICD-10-CM | POA: Diagnosis not present

## 2020-11-28 DIAGNOSIS — D849 Immunodeficiency, unspecified: Secondary | ICD-10-CM | POA: Diagnosis not present

## 2020-11-28 DIAGNOSIS — Z79899 Other long term (current) drug therapy: Secondary | ICD-10-CM | POA: Diagnosis not present

## 2020-11-28 DIAGNOSIS — D509 Iron deficiency anemia, unspecified: Secondary | ICD-10-CM | POA: Diagnosis not present

## 2020-11-28 DIAGNOSIS — J810 Acute pulmonary edema: Secondary | ICD-10-CM | POA: Diagnosis not present

## 2020-11-28 DIAGNOSIS — N269 Renal sclerosis, unspecified: Secondary | ICD-10-CM | POA: Diagnosis not present

## 2020-11-28 DIAGNOSIS — I701 Atherosclerosis of renal artery: Secondary | ICD-10-CM | POA: Diagnosis not present

## 2020-11-28 DIAGNOSIS — M109 Gout, unspecified: Secondary | ICD-10-CM | POA: Diagnosis not present

## 2020-11-28 DIAGNOSIS — N2581 Secondary hyperparathyroidism of renal origin: Secondary | ICD-10-CM | POA: Diagnosis not present

## 2020-11-28 DIAGNOSIS — E1122 Type 2 diabetes mellitus with diabetic chronic kidney disease: Secondary | ICD-10-CM | POA: Diagnosis not present

## 2020-11-28 DIAGNOSIS — D631 Anemia in chronic kidney disease: Secondary | ICD-10-CM | POA: Diagnosis not present

## 2020-11-28 DIAGNOSIS — E782 Mixed hyperlipidemia: Secondary | ICD-10-CM | POA: Diagnosis not present

## 2020-11-28 DIAGNOSIS — E785 Hyperlipidemia, unspecified: Secondary | ICD-10-CM | POA: Diagnosis not present

## 2020-11-28 DIAGNOSIS — N041 Nephrotic syndrome with focal and segmental glomerular lesions: Secondary | ICD-10-CM | POA: Diagnosis not present

## 2020-11-28 DIAGNOSIS — R519 Headache, unspecified: Secondary | ICD-10-CM | POA: Diagnosis not present

## 2020-12-06 DIAGNOSIS — D849 Immunodeficiency, unspecified: Secondary | ICD-10-CM | POA: Diagnosis not present

## 2020-12-06 DIAGNOSIS — Z79899 Other long term (current) drug therapy: Secondary | ICD-10-CM | POA: Diagnosis not present

## 2020-12-06 DIAGNOSIS — Z940 Kidney transplant status: Secondary | ICD-10-CM | POA: Diagnosis not present

## 2020-12-06 DIAGNOSIS — Z5181 Encounter for therapeutic drug level monitoring: Secondary | ICD-10-CM | POA: Diagnosis not present

## 2020-12-06 DIAGNOSIS — I151 Hypertension secondary to other renal disorders: Secondary | ICD-10-CM | POA: Diagnosis not present

## 2020-12-06 DIAGNOSIS — Z4822 Encounter for aftercare following kidney transplant: Secondary | ICD-10-CM | POA: Diagnosis not present

## 2020-12-06 DIAGNOSIS — N2889 Other specified disorders of kidney and ureter: Secondary | ICD-10-CM | POA: Diagnosis not present

## 2020-12-10 DIAGNOSIS — Z794 Long term (current) use of insulin: Secondary | ICD-10-CM | POA: Diagnosis not present

## 2020-12-10 DIAGNOSIS — N186 End stage renal disease: Secondary | ICD-10-CM | POA: Diagnosis not present

## 2020-12-10 DIAGNOSIS — E119 Type 2 diabetes mellitus without complications: Secondary | ICD-10-CM | POA: Diagnosis not present

## 2020-12-10 DIAGNOSIS — E785 Hyperlipidemia, unspecified: Secondary | ICD-10-CM | POA: Diagnosis not present

## 2020-12-10 DIAGNOSIS — N2889 Other specified disorders of kidney and ureter: Secondary | ICD-10-CM | POA: Diagnosis not present

## 2020-12-10 DIAGNOSIS — E872 Acidosis: Secondary | ICD-10-CM | POA: Diagnosis not present

## 2020-12-10 DIAGNOSIS — R809 Proteinuria, unspecified: Secondary | ICD-10-CM | POA: Diagnosis not present

## 2020-12-10 DIAGNOSIS — I120 Hypertensive chronic kidney disease with stage 5 chronic kidney disease or end stage renal disease: Secondary | ICD-10-CM | POA: Diagnosis not present

## 2020-12-10 DIAGNOSIS — Z5181 Encounter for therapeutic drug level monitoring: Secondary | ICD-10-CM | POA: Diagnosis not present

## 2020-12-10 DIAGNOSIS — Z4822 Encounter for aftercare following kidney transplant: Secondary | ICD-10-CM | POA: Diagnosis not present

## 2020-12-10 DIAGNOSIS — E1122 Type 2 diabetes mellitus with diabetic chronic kidney disease: Secondary | ICD-10-CM | POA: Diagnosis not present

## 2020-12-10 DIAGNOSIS — Z79899 Other long term (current) drug therapy: Secondary | ICD-10-CM | POA: Diagnosis not present

## 2020-12-10 DIAGNOSIS — I151 Hypertension secondary to other renal disorders: Secondary | ICD-10-CM | POA: Diagnosis not present

## 2020-12-10 DIAGNOSIS — D649 Anemia, unspecified: Secondary | ICD-10-CM | POA: Diagnosis not present

## 2020-12-10 DIAGNOSIS — Z940 Kidney transplant status: Secondary | ICD-10-CM | POA: Diagnosis not present

## 2020-12-10 DIAGNOSIS — Z888 Allergy status to other drugs, medicaments and biological substances status: Secondary | ICD-10-CM | POA: Diagnosis not present

## 2020-12-13 DIAGNOSIS — D649 Anemia, unspecified: Secondary | ICD-10-CM | POA: Diagnosis not present

## 2020-12-13 DIAGNOSIS — E119 Type 2 diabetes mellitus without complications: Secondary | ICD-10-CM | POA: Diagnosis not present

## 2020-12-13 DIAGNOSIS — E872 Acidosis: Secondary | ICD-10-CM | POA: Diagnosis not present

## 2020-12-13 DIAGNOSIS — Z888 Allergy status to other drugs, medicaments and biological substances status: Secondary | ICD-10-CM | POA: Diagnosis not present

## 2020-12-13 DIAGNOSIS — B258 Other cytomegaloviral diseases: Secondary | ICD-10-CM | POA: Diagnosis not present

## 2020-12-13 DIAGNOSIS — I10 Essential (primary) hypertension: Secondary | ICD-10-CM | POA: Diagnosis not present

## 2020-12-13 DIAGNOSIS — N186 End stage renal disease: Secondary | ICD-10-CM | POA: Diagnosis not present

## 2020-12-13 DIAGNOSIS — I120 Hypertensive chronic kidney disease with stage 5 chronic kidney disease or end stage renal disease: Secondary | ICD-10-CM | POA: Diagnosis not present

## 2020-12-13 DIAGNOSIS — Z794 Long term (current) use of insulin: Secondary | ICD-10-CM | POA: Diagnosis not present

## 2020-12-13 DIAGNOSIS — E1122 Type 2 diabetes mellitus with diabetic chronic kidney disease: Secondary | ICD-10-CM | POA: Diagnosis not present

## 2020-12-13 DIAGNOSIS — D8989 Other specified disorders involving the immune mechanism, not elsewhere classified: Secondary | ICD-10-CM | POA: Diagnosis not present

## 2020-12-13 DIAGNOSIS — E785 Hyperlipidemia, unspecified: Secondary | ICD-10-CM | POA: Diagnosis not present

## 2020-12-13 DIAGNOSIS — Z940 Kidney transplant status: Secondary | ICD-10-CM | POA: Diagnosis not present

## 2020-12-13 DIAGNOSIS — Z79899 Other long term (current) drug therapy: Secondary | ICD-10-CM | POA: Diagnosis not present

## 2020-12-13 DIAGNOSIS — R809 Proteinuria, unspecified: Secondary | ICD-10-CM | POA: Diagnosis not present

## 2020-12-13 DIAGNOSIS — Z4822 Encounter for aftercare following kidney transplant: Secondary | ICD-10-CM | POA: Diagnosis not present

## 2020-12-17 DIAGNOSIS — E119 Type 2 diabetes mellitus without complications: Secondary | ICD-10-CM | POA: Diagnosis not present

## 2020-12-17 DIAGNOSIS — E872 Acidosis: Secondary | ICD-10-CM | POA: Diagnosis not present

## 2020-12-17 DIAGNOSIS — D8989 Other specified disorders involving the immune mechanism, not elsewhere classified: Secondary | ICD-10-CM | POA: Diagnosis not present

## 2020-12-17 DIAGNOSIS — Z7982 Long term (current) use of aspirin: Secondary | ICD-10-CM | POA: Diagnosis not present

## 2020-12-17 DIAGNOSIS — E785 Hyperlipidemia, unspecified: Secondary | ICD-10-CM | POA: Diagnosis not present

## 2020-12-17 DIAGNOSIS — Z79899 Other long term (current) drug therapy: Secondary | ICD-10-CM | POA: Diagnosis not present

## 2020-12-17 DIAGNOSIS — Z960 Presence of urogenital implants: Secondary | ICD-10-CM | POA: Diagnosis not present

## 2020-12-17 DIAGNOSIS — Z7952 Long term (current) use of systemic steroids: Secondary | ICD-10-CM | POA: Diagnosis not present

## 2020-12-17 DIAGNOSIS — D649 Anemia, unspecified: Secondary | ICD-10-CM | POA: Diagnosis not present

## 2020-12-17 DIAGNOSIS — Z940 Kidney transplant status: Secondary | ICD-10-CM | POA: Diagnosis not present

## 2020-12-17 DIAGNOSIS — I10 Essential (primary) hypertension: Secondary | ICD-10-CM | POA: Diagnosis not present

## 2020-12-17 DIAGNOSIS — Z794 Long term (current) use of insulin: Secondary | ICD-10-CM | POA: Diagnosis not present

## 2020-12-17 DIAGNOSIS — Z4822 Encounter for aftercare following kidney transplant: Secondary | ICD-10-CM | POA: Diagnosis not present

## 2020-12-20 DIAGNOSIS — Z4822 Encounter for aftercare following kidney transplant: Secondary | ICD-10-CM | POA: Diagnosis not present

## 2020-12-20 DIAGNOSIS — Z940 Kidney transplant status: Secondary | ICD-10-CM | POA: Diagnosis not present

## 2020-12-20 DIAGNOSIS — Z8619 Personal history of other infectious and parasitic diseases: Secondary | ICD-10-CM | POA: Diagnosis not present

## 2020-12-20 DIAGNOSIS — E119 Type 2 diabetes mellitus without complications: Secondary | ICD-10-CM | POA: Diagnosis not present

## 2020-12-20 DIAGNOSIS — E785 Hyperlipidemia, unspecified: Secondary | ICD-10-CM | POA: Diagnosis not present

## 2020-12-20 DIAGNOSIS — Z888 Allergy status to other drugs, medicaments and biological substances status: Secondary | ICD-10-CM | POA: Diagnosis not present

## 2020-12-20 DIAGNOSIS — Z794 Long term (current) use of insulin: Secondary | ICD-10-CM | POA: Diagnosis not present

## 2020-12-20 DIAGNOSIS — Z79899 Other long term (current) drug therapy: Secondary | ICD-10-CM | POA: Diagnosis not present

## 2020-12-20 DIAGNOSIS — Z7952 Long term (current) use of systemic steroids: Secondary | ICD-10-CM | POA: Diagnosis not present

## 2020-12-20 DIAGNOSIS — I10 Essential (primary) hypertension: Secondary | ICD-10-CM | POA: Diagnosis not present

## 2020-12-20 DIAGNOSIS — D649 Anemia, unspecified: Secondary | ICD-10-CM | POA: Diagnosis not present

## 2020-12-20 DIAGNOSIS — R808 Other proteinuria: Secondary | ICD-10-CM | POA: Diagnosis not present

## 2020-12-20 DIAGNOSIS — E872 Acidosis: Secondary | ICD-10-CM | POA: Diagnosis not present

## 2020-12-20 DIAGNOSIS — D8989 Other specified disorders involving the immune mechanism, not elsewhere classified: Secondary | ICD-10-CM | POA: Diagnosis not present

## 2020-12-25 DIAGNOSIS — Z940 Kidney transplant status: Secondary | ICD-10-CM | POA: Diagnosis not present

## 2020-12-25 DIAGNOSIS — Z794 Long term (current) use of insulin: Secondary | ICD-10-CM | POA: Diagnosis not present

## 2020-12-25 DIAGNOSIS — T8619 Other complication of kidney transplant: Secondary | ICD-10-CM | POA: Diagnosis not present

## 2020-12-25 DIAGNOSIS — Z4822 Encounter for aftercare following kidney transplant: Secondary | ICD-10-CM | POA: Diagnosis not present

## 2020-12-25 DIAGNOSIS — E7849 Other hyperlipidemia: Secondary | ICD-10-CM | POA: Diagnosis not present

## 2020-12-25 DIAGNOSIS — E119 Type 2 diabetes mellitus without complications: Secondary | ICD-10-CM | POA: Diagnosis not present

## 2020-12-25 DIAGNOSIS — I10 Essential (primary) hypertension: Secondary | ICD-10-CM | POA: Diagnosis not present

## 2020-12-25 DIAGNOSIS — D849 Immunodeficiency, unspecified: Secondary | ICD-10-CM | POA: Diagnosis not present

## 2020-12-27 DIAGNOSIS — Z7952 Long term (current) use of systemic steroids: Secondary | ICD-10-CM | POA: Diagnosis not present

## 2020-12-27 DIAGNOSIS — R808 Other proteinuria: Secondary | ICD-10-CM | POA: Diagnosis not present

## 2020-12-27 DIAGNOSIS — D8989 Other specified disorders involving the immune mechanism, not elsewhere classified: Secondary | ICD-10-CM | POA: Diagnosis not present

## 2020-12-27 DIAGNOSIS — Z8619 Personal history of other infectious and parasitic diseases: Secondary | ICD-10-CM | POA: Diagnosis not present

## 2020-12-27 DIAGNOSIS — Z79899 Other long term (current) drug therapy: Secondary | ICD-10-CM | POA: Diagnosis not present

## 2020-12-27 DIAGNOSIS — Z4822 Encounter for aftercare following kidney transplant: Secondary | ICD-10-CM | POA: Diagnosis not present

## 2020-12-27 DIAGNOSIS — Z5181 Encounter for therapeutic drug level monitoring: Secondary | ICD-10-CM | POA: Diagnosis not present

## 2020-12-27 DIAGNOSIS — D649 Anemia, unspecified: Secondary | ICD-10-CM | POA: Diagnosis not present

## 2020-12-27 DIAGNOSIS — Z940 Kidney transplant status: Secondary | ICD-10-CM | POA: Diagnosis not present

## 2020-12-27 DIAGNOSIS — E785 Hyperlipidemia, unspecified: Secondary | ICD-10-CM | POA: Diagnosis not present

## 2020-12-27 DIAGNOSIS — I10 Essential (primary) hypertension: Secondary | ICD-10-CM | POA: Diagnosis not present

## 2020-12-27 DIAGNOSIS — E119 Type 2 diabetes mellitus without complications: Secondary | ICD-10-CM | POA: Diagnosis not present

## 2020-12-27 DIAGNOSIS — Z792 Long term (current) use of antibiotics: Secondary | ICD-10-CM | POA: Diagnosis not present

## 2020-12-27 DIAGNOSIS — Z960 Presence of urogenital implants: Secondary | ICD-10-CM | POA: Diagnosis not present

## 2020-12-27 DIAGNOSIS — Z794 Long term (current) use of insulin: Secondary | ICD-10-CM | POA: Diagnosis not present

## 2021-01-01 DIAGNOSIS — D849 Immunodeficiency, unspecified: Secondary | ICD-10-CM | POA: Diagnosis not present

## 2021-01-01 DIAGNOSIS — Z888 Allergy status to other drugs, medicaments and biological substances status: Secondary | ICD-10-CM | POA: Diagnosis not present

## 2021-01-01 DIAGNOSIS — E119 Type 2 diabetes mellitus without complications: Secondary | ICD-10-CM | POA: Diagnosis not present

## 2021-01-01 DIAGNOSIS — Z79899 Other long term (current) drug therapy: Secondary | ICD-10-CM | POA: Diagnosis not present

## 2021-01-01 DIAGNOSIS — B259 Cytomegaloviral disease, unspecified: Secondary | ICD-10-CM | POA: Diagnosis not present

## 2021-01-01 DIAGNOSIS — I151 Hypertension secondary to other renal disorders: Secondary | ICD-10-CM | POA: Diagnosis not present

## 2021-01-01 DIAGNOSIS — I120 Hypertensive chronic kidney disease with stage 5 chronic kidney disease or end stage renal disease: Secondary | ICD-10-CM | POA: Diagnosis not present

## 2021-01-01 DIAGNOSIS — D649 Anemia, unspecified: Secondary | ICD-10-CM | POA: Diagnosis not present

## 2021-01-01 DIAGNOSIS — Z5181 Encounter for therapeutic drug level monitoring: Secondary | ICD-10-CM | POA: Diagnosis not present

## 2021-01-01 DIAGNOSIS — Z940 Kidney transplant status: Secondary | ICD-10-CM | POA: Diagnosis not present

## 2021-01-01 DIAGNOSIS — Z794 Long term (current) use of insulin: Secondary | ICD-10-CM | POA: Diagnosis not present

## 2021-01-01 DIAGNOSIS — N2889 Other specified disorders of kidney and ureter: Secondary | ICD-10-CM | POA: Diagnosis not present

## 2021-01-01 DIAGNOSIS — E1122 Type 2 diabetes mellitus with diabetic chronic kidney disease: Secondary | ICD-10-CM | POA: Diagnosis not present

## 2021-01-01 DIAGNOSIS — N186 End stage renal disease: Secondary | ICD-10-CM | POA: Diagnosis not present

## 2021-01-01 DIAGNOSIS — Z960 Presence of urogenital implants: Secondary | ICD-10-CM | POA: Diagnosis not present

## 2021-01-01 DIAGNOSIS — E785 Hyperlipidemia, unspecified: Secondary | ICD-10-CM | POA: Diagnosis not present

## 2021-01-01 DIAGNOSIS — Z4822 Encounter for aftercare following kidney transplant: Secondary | ICD-10-CM | POA: Diagnosis not present

## 2021-01-04 DIAGNOSIS — Z20822 Contact with and (suspected) exposure to covid-19: Secondary | ICD-10-CM | POA: Diagnosis not present

## 2021-01-08 DIAGNOSIS — E1122 Type 2 diabetes mellitus with diabetic chronic kidney disease: Secondary | ICD-10-CM | POA: Diagnosis not present

## 2021-01-08 DIAGNOSIS — D72819 Decreased white blood cell count, unspecified: Secondary | ICD-10-CM | POA: Diagnosis not present

## 2021-01-08 DIAGNOSIS — E785 Hyperlipidemia, unspecified: Secondary | ICD-10-CM | POA: Diagnosis not present

## 2021-01-08 DIAGNOSIS — Z79899 Other long term (current) drug therapy: Secondary | ICD-10-CM | POA: Diagnosis not present

## 2021-01-08 DIAGNOSIS — I10 Essential (primary) hypertension: Secondary | ICD-10-CM | POA: Diagnosis not present

## 2021-01-08 DIAGNOSIS — Z940 Kidney transplant status: Secondary | ICD-10-CM | POA: Diagnosis not present

## 2021-01-08 DIAGNOSIS — I120 Hypertensive chronic kidney disease with stage 5 chronic kidney disease or end stage renal disease: Secondary | ICD-10-CM | POA: Diagnosis not present

## 2021-01-08 DIAGNOSIS — Z4822 Encounter for aftercare following kidney transplant: Secondary | ICD-10-CM | POA: Diagnosis not present

## 2021-01-08 DIAGNOSIS — Z794 Long term (current) use of insulin: Secondary | ICD-10-CM | POA: Diagnosis not present

## 2021-01-08 DIAGNOSIS — N186 End stage renal disease: Secondary | ICD-10-CM | POA: Diagnosis not present

## 2021-01-08 DIAGNOSIS — E119 Type 2 diabetes mellitus without complications: Secondary | ICD-10-CM | POA: Diagnosis not present

## 2021-01-08 DIAGNOSIS — D8989 Other specified disorders involving the immune mechanism, not elsewhere classified: Secondary | ICD-10-CM | POA: Diagnosis not present

## 2021-01-08 DIAGNOSIS — D649 Anemia, unspecified: Secondary | ICD-10-CM | POA: Diagnosis not present

## 2021-01-08 DIAGNOSIS — I951 Orthostatic hypotension: Secondary | ICD-10-CM | POA: Diagnosis not present

## 2021-01-15 DIAGNOSIS — Z4822 Encounter for aftercare following kidney transplant: Secondary | ICD-10-CM | POA: Insufficient documentation

## 2021-01-15 DIAGNOSIS — Z79621 Long term (current) use of calcineurin inhibitor: Secondary | ICD-10-CM | POA: Insufficient documentation

## 2021-01-15 DIAGNOSIS — Z79899 Other long term (current) drug therapy: Secondary | ICD-10-CM | POA: Diagnosis not present

## 2021-01-15 DIAGNOSIS — I10 Essential (primary) hypertension: Secondary | ICD-10-CM | POA: Diagnosis not present

## 2021-01-15 DIAGNOSIS — I951 Orthostatic hypotension: Secondary | ICD-10-CM | POA: Diagnosis not present

## 2021-01-15 DIAGNOSIS — Z792 Long term (current) use of antibiotics: Secondary | ICD-10-CM | POA: Diagnosis not present

## 2021-01-15 DIAGNOSIS — Z794 Long term (current) use of insulin: Secondary | ICD-10-CM | POA: Diagnosis not present

## 2021-01-15 DIAGNOSIS — Z7952 Long term (current) use of systemic steroids: Secondary | ICD-10-CM | POA: Diagnosis not present

## 2021-01-15 DIAGNOSIS — E119 Type 2 diabetes mellitus without complications: Secondary | ICD-10-CM | POA: Diagnosis not present

## 2021-01-15 DIAGNOSIS — E875 Hyperkalemia: Secondary | ICD-10-CM | POA: Diagnosis not present

## 2021-01-15 DIAGNOSIS — D72819 Decreased white blood cell count, unspecified: Secondary | ICD-10-CM | POA: Diagnosis not present

## 2021-01-15 DIAGNOSIS — E785 Hyperlipidemia, unspecified: Secondary | ICD-10-CM | POA: Diagnosis not present

## 2021-01-15 DIAGNOSIS — D8989 Other specified disorders involving the immune mechanism, not elsewhere classified: Secondary | ICD-10-CM | POA: Diagnosis not present

## 2021-01-15 DIAGNOSIS — D649 Anemia, unspecified: Secondary | ICD-10-CM | POA: Diagnosis not present

## 2021-01-15 DIAGNOSIS — Z940 Kidney transplant status: Secondary | ICD-10-CM | POA: Diagnosis not present

## 2021-01-15 DIAGNOSIS — T888XXA Other specified complications of surgical and medical care, not elsewhere classified, initial encounter: Secondary | ICD-10-CM | POA: Insufficient documentation

## 2021-01-22 DIAGNOSIS — N2889 Other specified disorders of kidney and ureter: Secondary | ICD-10-CM | POA: Diagnosis not present

## 2021-01-22 DIAGNOSIS — R0600 Dyspnea, unspecified: Secondary | ICD-10-CM | POA: Diagnosis not present

## 2021-01-22 DIAGNOSIS — I10 Essential (primary) hypertension: Secondary | ICD-10-CM | POA: Diagnosis not present

## 2021-01-22 DIAGNOSIS — E785 Hyperlipidemia, unspecified: Secondary | ICD-10-CM | POA: Diagnosis not present

## 2021-01-22 DIAGNOSIS — Z5181 Encounter for therapeutic drug level monitoring: Secondary | ICD-10-CM | POA: Diagnosis not present

## 2021-01-22 DIAGNOSIS — Z940 Kidney transplant status: Secondary | ICD-10-CM | POA: Diagnosis not present

## 2021-01-22 DIAGNOSIS — Z8619 Personal history of other infectious and parasitic diseases: Secondary | ICD-10-CM | POA: Diagnosis not present

## 2021-01-22 DIAGNOSIS — N261 Atrophy of kidney (terminal): Secondary | ICD-10-CM | POA: Diagnosis not present

## 2021-01-22 DIAGNOSIS — N051 Unspecified nephritic syndrome with focal and segmental glomerular lesions: Secondary | ICD-10-CM | POA: Diagnosis not present

## 2021-01-22 DIAGNOSIS — Z4822 Encounter for aftercare following kidney transplant: Secondary | ICD-10-CM | POA: Diagnosis not present

## 2021-01-22 DIAGNOSIS — I7090 Unspecified atherosclerosis: Secondary | ICD-10-CM | POA: Diagnosis not present

## 2021-01-22 DIAGNOSIS — D72819 Decreased white blood cell count, unspecified: Secondary | ICD-10-CM | POA: Diagnosis not present

## 2021-01-22 DIAGNOSIS — N269 Renal sclerosis, unspecified: Secondary | ICD-10-CM | POA: Diagnosis not present

## 2021-01-22 DIAGNOSIS — D849 Immunodeficiency, unspecified: Secondary | ICD-10-CM | POA: Diagnosis not present

## 2021-01-22 DIAGNOSIS — Z794 Long term (current) use of insulin: Secondary | ICD-10-CM | POA: Diagnosis not present

## 2021-01-22 DIAGNOSIS — Z79899 Other long term (current) drug therapy: Secondary | ICD-10-CM | POA: Diagnosis not present

## 2021-01-22 DIAGNOSIS — E119 Type 2 diabetes mellitus without complications: Secondary | ICD-10-CM | POA: Diagnosis not present

## 2021-01-22 DIAGNOSIS — Z87448 Personal history of other diseases of urinary system: Secondary | ICD-10-CM | POA: Diagnosis not present

## 2021-01-22 DIAGNOSIS — I959 Hypotension, unspecified: Secondary | ICD-10-CM | POA: Diagnosis not present

## 2021-01-22 DIAGNOSIS — E11649 Type 2 diabetes mellitus with hypoglycemia without coma: Secondary | ICD-10-CM | POA: Diagnosis not present

## 2021-01-22 DIAGNOSIS — Z792 Long term (current) use of antibiotics: Secondary | ICD-10-CM | POA: Diagnosis not present

## 2021-01-22 DIAGNOSIS — E162 Hypoglycemia, unspecified: Secondary | ICD-10-CM | POA: Diagnosis not present

## 2021-01-22 DIAGNOSIS — J849 Interstitial pulmonary disease, unspecified: Secondary | ICD-10-CM | POA: Diagnosis not present

## 2021-01-22 DIAGNOSIS — D649 Anemia, unspecified: Secondary | ICD-10-CM | POA: Diagnosis not present

## 2021-01-22 DIAGNOSIS — I151 Hypertension secondary to other renal disorders: Secondary | ICD-10-CM | POA: Diagnosis not present

## 2021-01-22 DIAGNOSIS — Z7952 Long term (current) use of systemic steroids: Secondary | ICD-10-CM | POA: Diagnosis not present

## 2021-01-29 DIAGNOSIS — Z79899 Other long term (current) drug therapy: Secondary | ICD-10-CM | POA: Diagnosis not present

## 2021-01-29 DIAGNOSIS — Z940 Kidney transplant status: Secondary | ICD-10-CM | POA: Diagnosis not present

## 2021-01-31 DIAGNOSIS — Z01810 Encounter for preprocedural cardiovascular examination: Secondary | ICD-10-CM | POA: Diagnosis not present

## 2021-02-05 DIAGNOSIS — I951 Orthostatic hypotension: Secondary | ICD-10-CM | POA: Diagnosis not present

## 2021-02-05 DIAGNOSIS — R0600 Dyspnea, unspecified: Secondary | ICD-10-CM | POA: Diagnosis not present

## 2021-02-05 DIAGNOSIS — Z79899 Other long term (current) drug therapy: Secondary | ICD-10-CM | POA: Diagnosis not present

## 2021-02-05 DIAGNOSIS — Z4822 Encounter for aftercare following kidney transplant: Secondary | ICD-10-CM | POA: Diagnosis not present

## 2021-02-05 DIAGNOSIS — Z7952 Long term (current) use of systemic steroids: Secondary | ICD-10-CM | POA: Diagnosis not present

## 2021-02-05 DIAGNOSIS — I10 Essential (primary) hypertension: Secondary | ICD-10-CM | POA: Diagnosis not present

## 2021-02-05 DIAGNOSIS — D8989 Other specified disorders involving the immune mechanism, not elsewhere classified: Secondary | ICD-10-CM | POA: Diagnosis not present

## 2021-02-05 DIAGNOSIS — R0602 Shortness of breath: Secondary | ICD-10-CM | POA: Diagnosis not present

## 2021-02-05 DIAGNOSIS — D72819 Decreased white blood cell count, unspecified: Secondary | ICD-10-CM | POA: Diagnosis not present

## 2021-02-05 DIAGNOSIS — Z940 Kidney transplant status: Secondary | ICD-10-CM | POA: Diagnosis not present

## 2021-02-05 DIAGNOSIS — E785 Hyperlipidemia, unspecified: Secondary | ICD-10-CM | POA: Diagnosis not present

## 2021-02-05 DIAGNOSIS — E119 Type 2 diabetes mellitus without complications: Secondary | ICD-10-CM | POA: Diagnosis not present

## 2021-02-05 DIAGNOSIS — D649 Anemia, unspecified: Secondary | ICD-10-CM | POA: Diagnosis not present

## 2021-02-05 DIAGNOSIS — Z792 Long term (current) use of antibiotics: Secondary | ICD-10-CM | POA: Diagnosis not present

## 2021-02-05 DIAGNOSIS — R808 Other proteinuria: Secondary | ICD-10-CM | POA: Diagnosis not present

## 2021-02-13 DIAGNOSIS — I371 Nonrheumatic pulmonary valve insufficiency: Secondary | ICD-10-CM | POA: Diagnosis not present

## 2021-02-13 DIAGNOSIS — I071 Rheumatic tricuspid insufficiency: Secondary | ICD-10-CM | POA: Diagnosis not present

## 2021-02-13 DIAGNOSIS — Z940 Kidney transplant status: Secondary | ICD-10-CM | POA: Diagnosis not present

## 2021-02-13 DIAGNOSIS — I770 Arteriovenous fistula, acquired: Secondary | ICD-10-CM | POA: Diagnosis not present

## 2021-02-13 DIAGNOSIS — I878 Other specified disorders of veins: Secondary | ICD-10-CM | POA: Diagnosis not present

## 2021-02-19 DIAGNOSIS — E785 Hyperlipidemia, unspecified: Secondary | ICD-10-CM | POA: Diagnosis not present

## 2021-02-19 DIAGNOSIS — Z794 Long term (current) use of insulin: Secondary | ICD-10-CM | POA: Diagnosis not present

## 2021-02-19 DIAGNOSIS — Z4822 Encounter for aftercare following kidney transplant: Secondary | ICD-10-CM | POA: Diagnosis not present

## 2021-02-19 DIAGNOSIS — Z940 Kidney transplant status: Secondary | ICD-10-CM | POA: Diagnosis not present

## 2021-02-19 DIAGNOSIS — D649 Anemia, unspecified: Secondary | ICD-10-CM | POA: Diagnosis not present

## 2021-02-19 DIAGNOSIS — E119 Type 2 diabetes mellitus without complications: Secondary | ICD-10-CM | POA: Diagnosis not present

## 2021-02-19 DIAGNOSIS — D8989 Other specified disorders involving the immune mechanism, not elsewhere classified: Secondary | ICD-10-CM | POA: Diagnosis not present

## 2021-02-19 DIAGNOSIS — I951 Orthostatic hypotension: Secondary | ICD-10-CM | POA: Diagnosis not present

## 2021-02-19 DIAGNOSIS — I10 Essential (primary) hypertension: Secondary | ICD-10-CM | POA: Diagnosis not present

## 2021-02-19 DIAGNOSIS — Z792 Long term (current) use of antibiotics: Secondary | ICD-10-CM | POA: Diagnosis not present

## 2021-02-19 DIAGNOSIS — Z79899 Other long term (current) drug therapy: Secondary | ICD-10-CM | POA: Diagnosis not present

## 2021-02-19 DIAGNOSIS — Z7952 Long term (current) use of systemic steroids: Secondary | ICD-10-CM | POA: Diagnosis not present

## 2021-02-19 DIAGNOSIS — I071 Rheumatic tricuspid insufficiency: Secondary | ICD-10-CM | POA: Diagnosis not present

## 2021-03-05 DIAGNOSIS — T8612 Kidney transplant failure: Secondary | ICD-10-CM | POA: Diagnosis not present

## 2021-03-05 DIAGNOSIS — D72819 Decreased white blood cell count, unspecified: Secondary | ICD-10-CM | POA: Diagnosis not present

## 2021-03-05 DIAGNOSIS — Z792 Long term (current) use of antibiotics: Secondary | ICD-10-CM | POA: Diagnosis not present

## 2021-03-05 DIAGNOSIS — E785 Hyperlipidemia, unspecified: Secondary | ICD-10-CM | POA: Diagnosis not present

## 2021-03-05 DIAGNOSIS — Z7952 Long term (current) use of systemic steroids: Secondary | ICD-10-CM | POA: Diagnosis not present

## 2021-03-05 DIAGNOSIS — Z4822 Encounter for aftercare following kidney transplant: Secondary | ICD-10-CM | POA: Diagnosis not present

## 2021-03-05 DIAGNOSIS — Z5181 Encounter for therapeutic drug level monitoring: Secondary | ICD-10-CM | POA: Diagnosis not present

## 2021-03-05 DIAGNOSIS — R0600 Dyspnea, unspecified: Secondary | ICD-10-CM | POA: Diagnosis not present

## 2021-03-05 DIAGNOSIS — Z794 Long term (current) use of insulin: Secondary | ICD-10-CM | POA: Diagnosis not present

## 2021-03-05 DIAGNOSIS — Z79899 Other long term (current) drug therapy: Secondary | ICD-10-CM | POA: Diagnosis not present

## 2021-03-05 DIAGNOSIS — I770 Arteriovenous fistula, acquired: Secondary | ICD-10-CM | POA: Diagnosis not present

## 2021-03-05 DIAGNOSIS — D849 Immunodeficiency, unspecified: Secondary | ICD-10-CM | POA: Diagnosis not present

## 2021-03-05 DIAGNOSIS — T82858D Stenosis of vascular prosthetic devices, implants and grafts, subsequent encounter: Secondary | ICD-10-CM | POA: Diagnosis not present

## 2021-03-05 DIAGNOSIS — I10 Essential (primary) hypertension: Secondary | ICD-10-CM | POA: Diagnosis not present

## 2021-03-05 DIAGNOSIS — N2889 Other specified disorders of kidney and ureter: Secondary | ICD-10-CM | POA: Diagnosis not present

## 2021-03-05 DIAGNOSIS — Z87448 Personal history of other diseases of urinary system: Secondary | ICD-10-CM | POA: Diagnosis not present

## 2021-03-05 DIAGNOSIS — E119 Type 2 diabetes mellitus without complications: Secondary | ICD-10-CM | POA: Diagnosis not present

## 2021-03-05 DIAGNOSIS — I071 Rheumatic tricuspid insufficiency: Secondary | ICD-10-CM | POA: Diagnosis not present

## 2021-03-05 DIAGNOSIS — D649 Anemia, unspecified: Secondary | ICD-10-CM | POA: Diagnosis not present

## 2021-03-05 DIAGNOSIS — I151 Hypertension secondary to other renal disorders: Secondary | ICD-10-CM | POA: Diagnosis not present

## 2021-03-05 DIAGNOSIS — I951 Orthostatic hypotension: Secondary | ICD-10-CM | POA: Diagnosis not present

## 2021-03-05 DIAGNOSIS — Z8619 Personal history of other infectious and parasitic diseases: Secondary | ICD-10-CM | POA: Diagnosis not present

## 2021-03-05 DIAGNOSIS — Z940 Kidney transplant status: Secondary | ICD-10-CM | POA: Diagnosis not present

## 2021-03-06 DIAGNOSIS — E113293 Type 2 diabetes mellitus with mild nonproliferative diabetic retinopathy without macular edema, bilateral: Secondary | ICD-10-CM | POA: Diagnosis not present

## 2021-03-06 DIAGNOSIS — H2513 Age-related nuclear cataract, bilateral: Secondary | ICD-10-CM | POA: Diagnosis not present

## 2021-03-20 DIAGNOSIS — E785 Hyperlipidemia, unspecified: Secondary | ICD-10-CM | POA: Diagnosis not present

## 2021-03-20 DIAGNOSIS — Z7952 Long term (current) use of systemic steroids: Secondary | ICD-10-CM | POA: Diagnosis not present

## 2021-03-20 DIAGNOSIS — Z792 Long term (current) use of antibiotics: Secondary | ICD-10-CM | POA: Diagnosis not present

## 2021-03-20 DIAGNOSIS — R0600 Dyspnea, unspecified: Secondary | ICD-10-CM | POA: Diagnosis not present

## 2021-03-20 DIAGNOSIS — D649 Anemia, unspecified: Secondary | ICD-10-CM | POA: Diagnosis not present

## 2021-03-20 DIAGNOSIS — Z794 Long term (current) use of insulin: Secondary | ICD-10-CM | POA: Diagnosis not present

## 2021-03-20 DIAGNOSIS — I10 Essential (primary) hypertension: Secondary | ICD-10-CM | POA: Diagnosis not present

## 2021-03-20 DIAGNOSIS — I071 Rheumatic tricuspid insufficiency: Secondary | ICD-10-CM | POA: Diagnosis not present

## 2021-03-20 DIAGNOSIS — Z940 Kidney transplant status: Secondary | ICD-10-CM | POA: Diagnosis not present

## 2021-03-20 DIAGNOSIS — D72819 Decreased white blood cell count, unspecified: Secondary | ICD-10-CM | POA: Diagnosis not present

## 2021-03-20 DIAGNOSIS — D8989 Other specified disorders involving the immune mechanism, not elsewhere classified: Secondary | ICD-10-CM | POA: Diagnosis not present

## 2021-03-20 DIAGNOSIS — Z79899 Other long term (current) drug therapy: Secondary | ICD-10-CM | POA: Diagnosis not present

## 2021-03-20 DIAGNOSIS — I951 Orthostatic hypotension: Secondary | ICD-10-CM | POA: Diagnosis not present

## 2021-03-20 DIAGNOSIS — Z4822 Encounter for aftercare following kidney transplant: Secondary | ICD-10-CM | POA: Diagnosis not present

## 2021-03-20 DIAGNOSIS — E119 Type 2 diabetes mellitus without complications: Secondary | ICD-10-CM | POA: Diagnosis not present

## 2021-03-25 DIAGNOSIS — I10 Essential (primary) hypertension: Secondary | ICD-10-CM | POA: Diagnosis not present

## 2021-03-25 DIAGNOSIS — T82898A Other specified complication of vascular prosthetic devices, implants and grafts, initial encounter: Secondary | ICD-10-CM | POA: Diagnosis not present

## 2021-04-03 DIAGNOSIS — Z23 Encounter for immunization: Secondary | ICD-10-CM | POA: Diagnosis not present

## 2021-04-03 DIAGNOSIS — R0609 Other forms of dyspnea: Secondary | ICD-10-CM | POA: Diagnosis not present

## 2021-04-03 DIAGNOSIS — D649 Anemia, unspecified: Secondary | ICD-10-CM | POA: Diagnosis not present

## 2021-04-03 DIAGNOSIS — Z4822 Encounter for aftercare following kidney transplant: Secondary | ICD-10-CM | POA: Diagnosis not present

## 2021-04-03 DIAGNOSIS — E785 Hyperlipidemia, unspecified: Secondary | ICD-10-CM | POA: Diagnosis not present

## 2021-04-03 DIAGNOSIS — D8989 Other specified disorders involving the immune mechanism, not elsewhere classified: Secondary | ICD-10-CM | POA: Diagnosis not present

## 2021-04-03 DIAGNOSIS — I951 Orthostatic hypotension: Secondary | ICD-10-CM | POA: Diagnosis not present

## 2021-04-03 DIAGNOSIS — R808 Other proteinuria: Secondary | ICD-10-CM | POA: Diagnosis not present

## 2021-04-03 DIAGNOSIS — R0600 Dyspnea, unspecified: Secondary | ICD-10-CM | POA: Diagnosis not present

## 2021-04-03 DIAGNOSIS — Z7952 Long term (current) use of systemic steroids: Secondary | ICD-10-CM | POA: Diagnosis not present

## 2021-04-03 DIAGNOSIS — Z940 Kidney transplant status: Secondary | ICD-10-CM | POA: Diagnosis not present

## 2021-04-03 DIAGNOSIS — I10 Essential (primary) hypertension: Secondary | ICD-10-CM | POA: Diagnosis not present

## 2021-04-03 DIAGNOSIS — B259 Cytomegaloviral disease, unspecified: Secondary | ICD-10-CM | POA: Diagnosis not present

## 2021-04-03 DIAGNOSIS — E119 Type 2 diabetes mellitus without complications: Secondary | ICD-10-CM | POA: Diagnosis not present

## 2021-04-03 DIAGNOSIS — Z79621 Long term (current) use of calcineurin inhibitor: Secondary | ICD-10-CM | POA: Diagnosis not present

## 2021-04-03 DIAGNOSIS — D72819 Decreased white blood cell count, unspecified: Secondary | ICD-10-CM | POA: Diagnosis not present

## 2021-04-03 DIAGNOSIS — I770 Arteriovenous fistula, acquired: Secondary | ICD-10-CM | POA: Diagnosis not present

## 2021-04-03 DIAGNOSIS — Z794 Long term (current) use of insulin: Secondary | ICD-10-CM | POA: Diagnosis not present

## 2021-04-11 DIAGNOSIS — Z1151 Encounter for screening for human papillomavirus (HPV): Secondary | ICD-10-CM | POA: Diagnosis not present

## 2021-04-11 DIAGNOSIS — Z779 Other contact with and (suspected) exposures hazardous to health: Secondary | ICD-10-CM | POA: Diagnosis not present

## 2021-04-11 DIAGNOSIS — Z6824 Body mass index (BMI) 24.0-24.9, adult: Secondary | ICD-10-CM | POA: Diagnosis not present

## 2021-04-11 DIAGNOSIS — R87610 Atypical squamous cells of undetermined significance on cytologic smear of cervix (ASC-US): Secondary | ICD-10-CM | POA: Diagnosis not present

## 2021-04-11 DIAGNOSIS — Z01419 Encounter for gynecological examination (general) (routine) without abnormal findings: Secondary | ICD-10-CM | POA: Diagnosis not present

## 2021-04-11 DIAGNOSIS — Z124 Encounter for screening for malignant neoplasm of cervix: Secondary | ICD-10-CM | POA: Diagnosis not present

## 2021-04-11 DIAGNOSIS — Z1231 Encounter for screening mammogram for malignant neoplasm of breast: Secondary | ICD-10-CM | POA: Diagnosis not present

## 2021-04-11 DIAGNOSIS — Z7689 Persons encountering health services in other specified circumstances: Secondary | ICD-10-CM | POA: Diagnosis not present

## 2021-04-17 DIAGNOSIS — Z940 Kidney transplant status: Secondary | ICD-10-CM | POA: Diagnosis not present

## 2021-04-17 DIAGNOSIS — D849 Immunodeficiency, unspecified: Secondary | ICD-10-CM | POA: Diagnosis not present

## 2021-04-30 DIAGNOSIS — R0609 Other forms of dyspnea: Secondary | ICD-10-CM | POA: Diagnosis not present

## 2021-04-30 DIAGNOSIS — D8989 Other specified disorders involving the immune mechanism, not elsewhere classified: Secondary | ICD-10-CM | POA: Diagnosis not present

## 2021-04-30 DIAGNOSIS — D649 Anemia, unspecified: Secondary | ICD-10-CM | POA: Diagnosis not present

## 2021-04-30 DIAGNOSIS — B259 Cytomegaloviral disease, unspecified: Secondary | ICD-10-CM | POA: Diagnosis not present

## 2021-04-30 DIAGNOSIS — Z792 Long term (current) use of antibiotics: Secondary | ICD-10-CM | POA: Diagnosis not present

## 2021-04-30 DIAGNOSIS — E785 Hyperlipidemia, unspecified: Secondary | ICD-10-CM | POA: Diagnosis not present

## 2021-04-30 DIAGNOSIS — Z79621 Long term (current) use of calcineurin inhibitor: Secondary | ICD-10-CM | POA: Diagnosis not present

## 2021-04-30 DIAGNOSIS — E119 Type 2 diabetes mellitus without complications: Secondary | ICD-10-CM | POA: Diagnosis not present

## 2021-04-30 DIAGNOSIS — Z940 Kidney transplant status: Secondary | ICD-10-CM | POA: Diagnosis not present

## 2021-04-30 DIAGNOSIS — D72819 Decreased white blood cell count, unspecified: Secondary | ICD-10-CM | POA: Diagnosis not present

## 2021-04-30 DIAGNOSIS — Z7952 Long term (current) use of systemic steroids: Secondary | ICD-10-CM | POA: Diagnosis not present

## 2021-04-30 DIAGNOSIS — Z23 Encounter for immunization: Secondary | ICD-10-CM | POA: Diagnosis not present

## 2021-04-30 DIAGNOSIS — R0602 Shortness of breath: Secondary | ICD-10-CM | POA: Diagnosis not present

## 2021-04-30 DIAGNOSIS — I10 Essential (primary) hypertension: Secondary | ICD-10-CM | POA: Diagnosis not present

## 2021-04-30 DIAGNOSIS — Z4822 Encounter for aftercare following kidney transplant: Secondary | ICD-10-CM | POA: Diagnosis not present

## 2021-04-30 DIAGNOSIS — I719 Aortic aneurysm of unspecified site, without rupture: Secondary | ICD-10-CM | POA: Diagnosis not present

## 2021-04-30 DIAGNOSIS — T8612 Kidney transplant failure: Secondary | ICD-10-CM | POA: Diagnosis not present

## 2021-04-30 DIAGNOSIS — Z794 Long term (current) use of insulin: Secondary | ICD-10-CM | POA: Diagnosis not present

## 2021-05-15 DIAGNOSIS — D849 Immunodeficiency, unspecified: Secondary | ICD-10-CM | POA: Diagnosis not present

## 2021-05-15 DIAGNOSIS — Z940 Kidney transplant status: Secondary | ICD-10-CM | POA: Diagnosis not present

## 2021-05-28 DIAGNOSIS — I770 Arteriovenous fistula, acquired: Secondary | ICD-10-CM | POA: Diagnosis not present

## 2021-05-28 DIAGNOSIS — E119 Type 2 diabetes mellitus without complications: Secondary | ICD-10-CM | POA: Diagnosis not present

## 2021-05-28 DIAGNOSIS — D649 Anemia, unspecified: Secondary | ICD-10-CM | POA: Diagnosis not present

## 2021-05-28 DIAGNOSIS — R0609 Other forms of dyspnea: Secondary | ICD-10-CM | POA: Diagnosis not present

## 2021-05-28 DIAGNOSIS — E785 Hyperlipidemia, unspecified: Secondary | ICD-10-CM | POA: Diagnosis not present

## 2021-05-28 DIAGNOSIS — Z79621 Long term (current) use of calcineurin inhibitor: Secondary | ICD-10-CM | POA: Diagnosis not present

## 2021-05-28 DIAGNOSIS — Z4822 Encounter for aftercare following kidney transplant: Secondary | ICD-10-CM | POA: Diagnosis not present

## 2021-05-28 DIAGNOSIS — Z7952 Long term (current) use of systemic steroids: Secondary | ICD-10-CM | POA: Diagnosis not present

## 2021-05-28 DIAGNOSIS — Z940 Kidney transplant status: Secondary | ICD-10-CM | POA: Diagnosis not present

## 2021-05-28 DIAGNOSIS — Z79899 Other long term (current) drug therapy: Secondary | ICD-10-CM | POA: Diagnosis not present

## 2021-05-28 DIAGNOSIS — Z888 Allergy status to other drugs, medicaments and biological substances status: Secondary | ICD-10-CM | POA: Diagnosis not present

## 2021-05-28 DIAGNOSIS — I10 Essential (primary) hypertension: Secondary | ICD-10-CM | POA: Diagnosis not present

## 2021-05-28 DIAGNOSIS — I340 Nonrheumatic mitral (valve) insufficiency: Secondary | ICD-10-CM | POA: Diagnosis not present

## 2021-05-28 DIAGNOSIS — D72819 Decreased white blood cell count, unspecified: Secondary | ICD-10-CM | POA: Diagnosis not present

## 2021-05-28 DIAGNOSIS — B259 Cytomegaloviral disease, unspecified: Secondary | ICD-10-CM | POA: Diagnosis not present

## 2021-05-28 DIAGNOSIS — Z794 Long term (current) use of insulin: Secondary | ICD-10-CM | POA: Diagnosis not present

## 2021-05-28 DIAGNOSIS — D8989 Other specified disorders involving the immune mechanism, not elsewhere classified: Secondary | ICD-10-CM | POA: Diagnosis not present

## 2021-05-29 DIAGNOSIS — Z79899 Other long term (current) drug therapy: Secondary | ICD-10-CM | POA: Diagnosis not present

## 2021-05-29 DIAGNOSIS — Z4822 Encounter for aftercare following kidney transplant: Secondary | ICD-10-CM | POA: Diagnosis not present

## 2021-05-29 DIAGNOSIS — R7989 Other specified abnormal findings of blood chemistry: Secondary | ICD-10-CM | POA: Diagnosis not present

## 2021-05-29 DIAGNOSIS — Z940 Kidney transplant status: Secondary | ICD-10-CM | POA: Diagnosis not present

## 2021-05-29 DIAGNOSIS — D849 Immunodeficiency, unspecified: Secondary | ICD-10-CM | POA: Diagnosis not present

## 2021-05-30 DIAGNOSIS — Z20822 Contact with and (suspected) exposure to covid-19: Secondary | ICD-10-CM | POA: Diagnosis not present

## 2021-06-12 DIAGNOSIS — Z940 Kidney transplant status: Secondary | ICD-10-CM | POA: Diagnosis not present

## 2021-06-12 DIAGNOSIS — D849 Immunodeficiency, unspecified: Secondary | ICD-10-CM | POA: Diagnosis not present

## 2021-06-25 DIAGNOSIS — E785 Hyperlipidemia, unspecified: Secondary | ICD-10-CM | POA: Diagnosis not present

## 2021-06-25 DIAGNOSIS — R0602 Shortness of breath: Secondary | ICD-10-CM | POA: Diagnosis not present

## 2021-06-25 DIAGNOSIS — Z792 Long term (current) use of antibiotics: Secondary | ICD-10-CM | POA: Diagnosis not present

## 2021-06-25 DIAGNOSIS — Z4822 Encounter for aftercare following kidney transplant: Secondary | ICD-10-CM | POA: Diagnosis not present

## 2021-06-25 DIAGNOSIS — B259 Cytomegaloviral disease, unspecified: Secondary | ICD-10-CM | POA: Diagnosis not present

## 2021-06-25 DIAGNOSIS — Z7952 Long term (current) use of systemic steroids: Secondary | ICD-10-CM | POA: Diagnosis not present

## 2021-06-25 DIAGNOSIS — Z79621 Long term (current) use of calcineurin inhibitor: Secondary | ICD-10-CM | POA: Diagnosis not present

## 2021-06-25 DIAGNOSIS — Z794 Long term (current) use of insulin: Secondary | ICD-10-CM | POA: Diagnosis not present

## 2021-06-25 DIAGNOSIS — I151 Hypertension secondary to other renal disorders: Secondary | ICD-10-CM | POA: Diagnosis not present

## 2021-06-25 DIAGNOSIS — Z79899 Other long term (current) drug therapy: Secondary | ICD-10-CM | POA: Diagnosis not present

## 2021-06-25 DIAGNOSIS — D649 Anemia, unspecified: Secondary | ICD-10-CM | POA: Diagnosis not present

## 2021-06-25 DIAGNOSIS — D72819 Decreased white blood cell count, unspecified: Secondary | ICD-10-CM | POA: Diagnosis not present

## 2021-06-25 DIAGNOSIS — Z5181 Encounter for therapeutic drug level monitoring: Secondary | ICD-10-CM | POA: Diagnosis not present

## 2021-06-25 DIAGNOSIS — E119 Type 2 diabetes mellitus without complications: Secondary | ICD-10-CM | POA: Diagnosis not present

## 2021-06-25 DIAGNOSIS — I071 Rheumatic tricuspid insufficiency: Secondary | ICD-10-CM | POA: Diagnosis not present

## 2021-06-25 DIAGNOSIS — N2889 Other specified disorders of kidney and ureter: Secondary | ICD-10-CM | POA: Diagnosis not present

## 2021-06-25 DIAGNOSIS — I10 Essential (primary) hypertension: Secondary | ICD-10-CM | POA: Diagnosis not present

## 2021-06-25 DIAGNOSIS — I770 Arteriovenous fistula, acquired: Secondary | ICD-10-CM | POA: Diagnosis not present

## 2021-06-25 DIAGNOSIS — Z940 Kidney transplant status: Secondary | ICD-10-CM | POA: Diagnosis not present

## 2021-06-28 DIAGNOSIS — Z7969 Long term (current) use of other immunomodulators and immunosuppressants: Secondary | ICD-10-CM | POA: Diagnosis not present

## 2021-06-28 DIAGNOSIS — Z79621 Long term (current) use of calcineurin inhibitor: Secondary | ICD-10-CM | POA: Diagnosis not present

## 2021-06-28 DIAGNOSIS — Z940 Kidney transplant status: Secondary | ICD-10-CM | POA: Diagnosis not present

## 2021-06-28 DIAGNOSIS — Z5181 Encounter for therapeutic drug level monitoring: Secondary | ICD-10-CM | POA: Diagnosis not present

## 2021-06-28 DIAGNOSIS — Z7952 Long term (current) use of systemic steroids: Secondary | ICD-10-CM | POA: Diagnosis not present

## 2021-07-17 DIAGNOSIS — Z940 Kidney transplant status: Secondary | ICD-10-CM | POA: Diagnosis not present

## 2021-07-17 DIAGNOSIS — Z79899 Other long term (current) drug therapy: Secondary | ICD-10-CM | POA: Diagnosis not present

## 2021-07-23 DIAGNOSIS — U071 COVID-19: Secondary | ICD-10-CM | POA: Diagnosis not present

## 2021-07-23 DIAGNOSIS — Z20822 Contact with and (suspected) exposure to covid-19: Secondary | ICD-10-CM | POA: Diagnosis not present

## 2021-08-01 DIAGNOSIS — U071 COVID-19: Secondary | ICD-10-CM | POA: Diagnosis not present

## 2021-08-01 DIAGNOSIS — Z20822 Contact with and (suspected) exposure to covid-19: Secondary | ICD-10-CM | POA: Diagnosis not present

## 2021-08-05 DIAGNOSIS — Z940 Kidney transplant status: Secondary | ICD-10-CM | POA: Diagnosis not present

## 2021-08-05 DIAGNOSIS — D849 Immunodeficiency, unspecified: Secondary | ICD-10-CM | POA: Diagnosis not present

## 2021-08-11 IMAGING — US US THYROID
1 series · 13 of 25 positions shown · non-contrast
Comparison: Prior thyroid ultrasound 09/28/2017; 08/01/2016

CLINICAL DATA: Goiter. Multinodular thyroid goiter. Patient is
previously undergone biopsy of both right and left thyroid nodules
in Wednesday September, 2016. Additionally, the right-sided thyroid nodule was
also biopsied in 1774.

EXAM:
THYROID ULTRASOUND
TECHNIQUE: Ultrasound examination of the thyroid gland and adjacent soft
tissues was performed.

[Series 1: us thyroid · 0.08mm/px · 68 acquisitions, 13 frames shown]
[im 1/68]
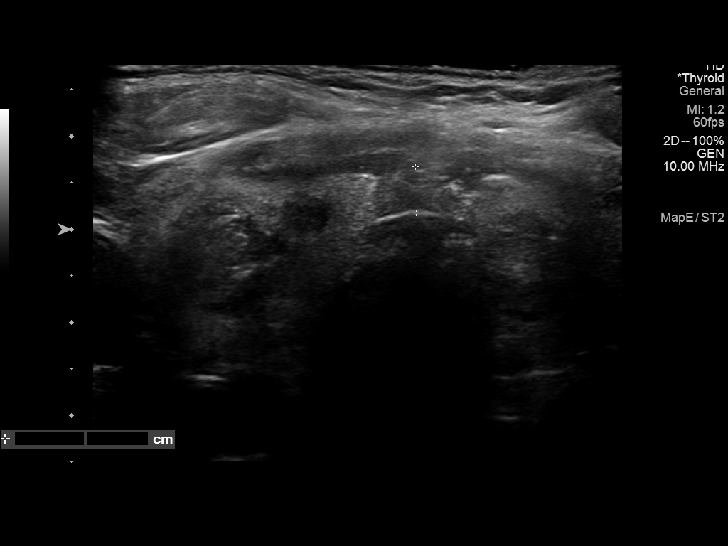
[im 6/68]
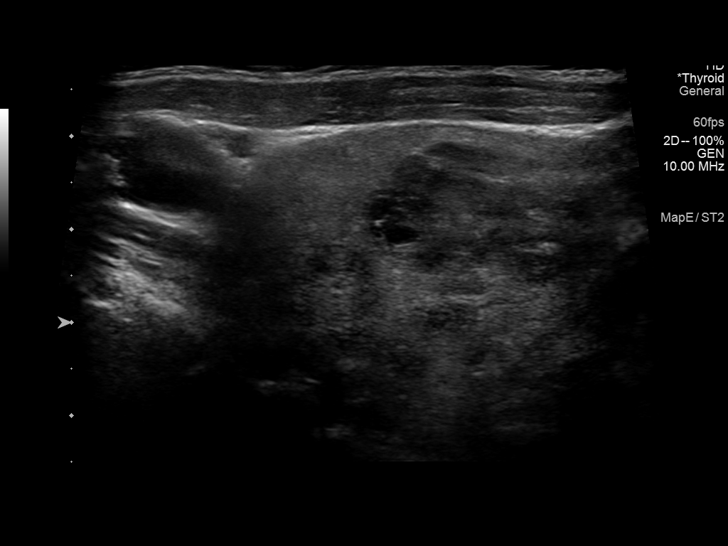
[im 12/68]
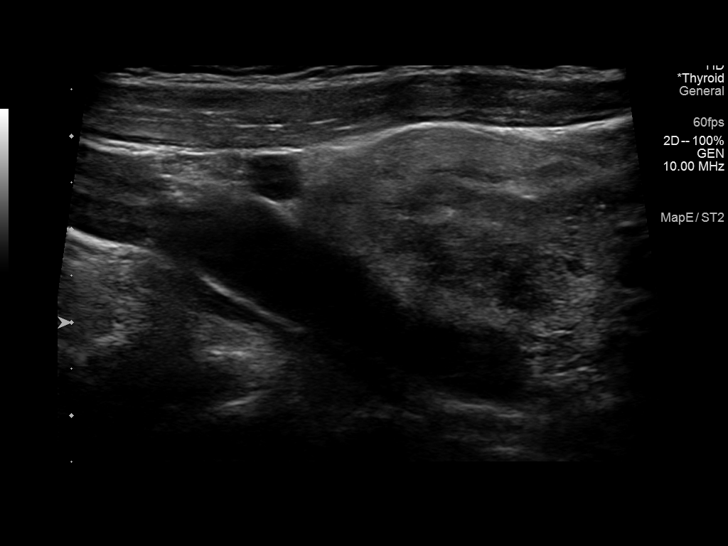
[im 17/68]
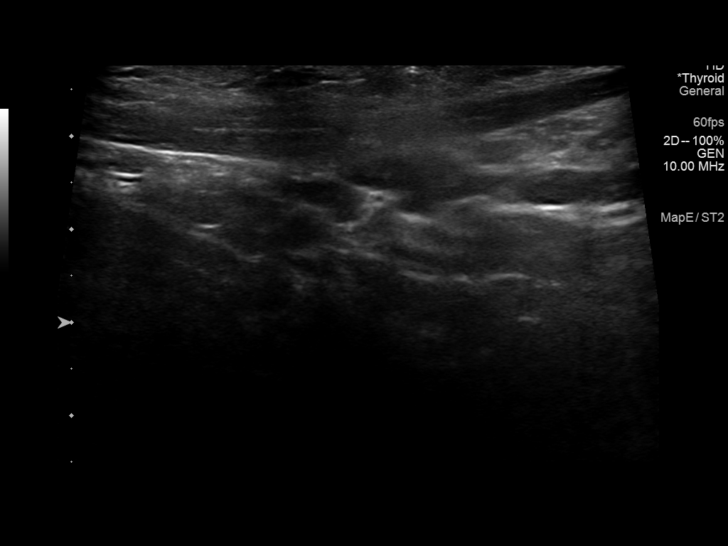
[im 23/68]
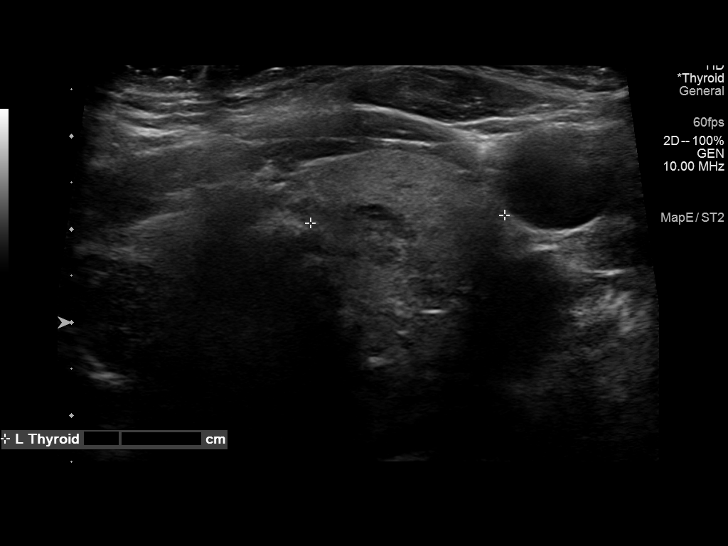
[im 28/68]
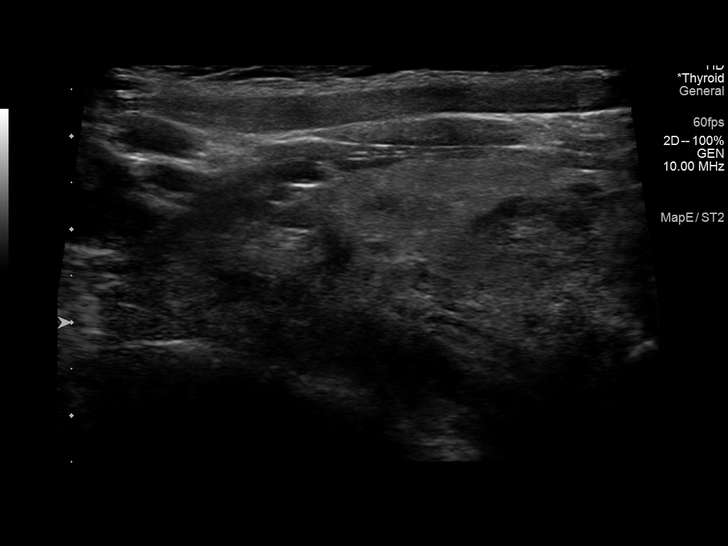
[im 34/68]
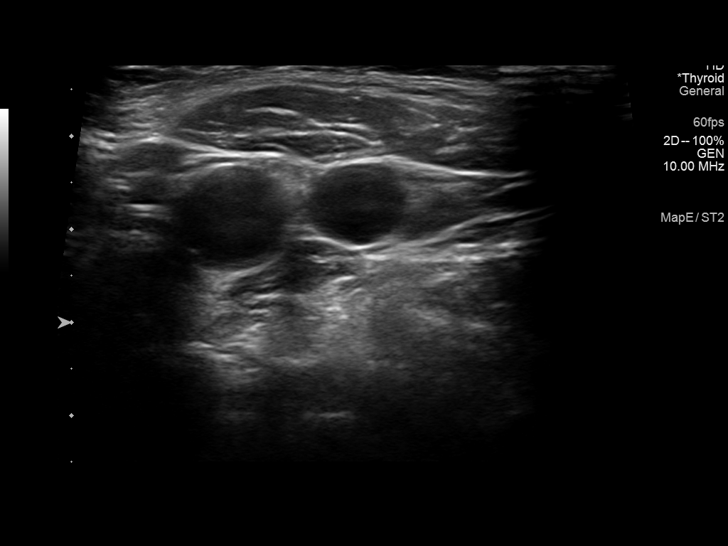
[im 40/68]
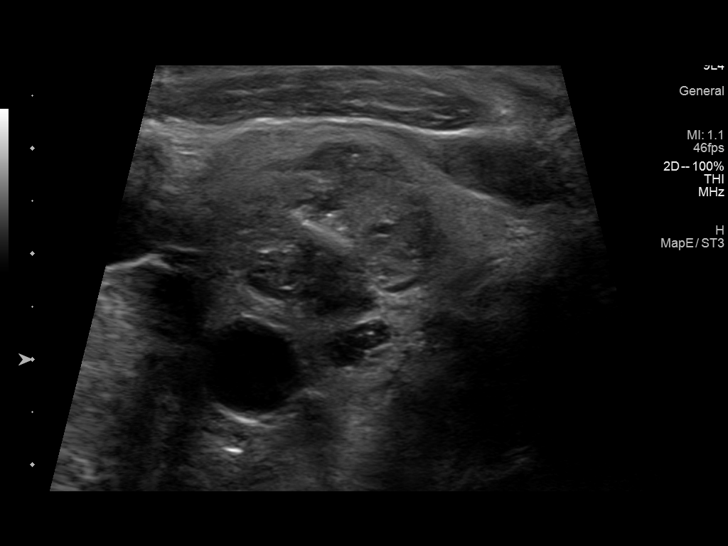
[im 45/68]
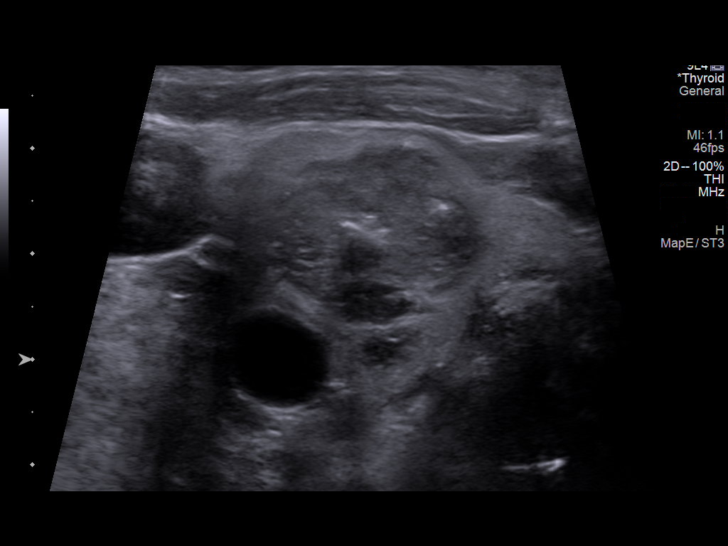
[im 51/68]
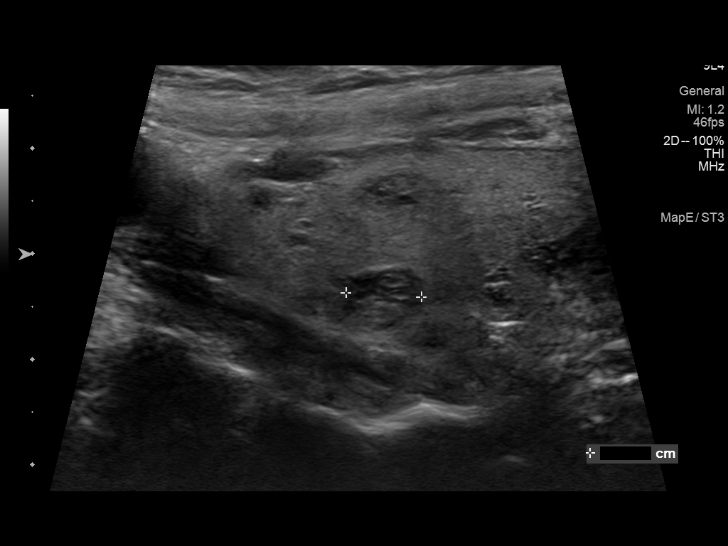
[im 56/68]
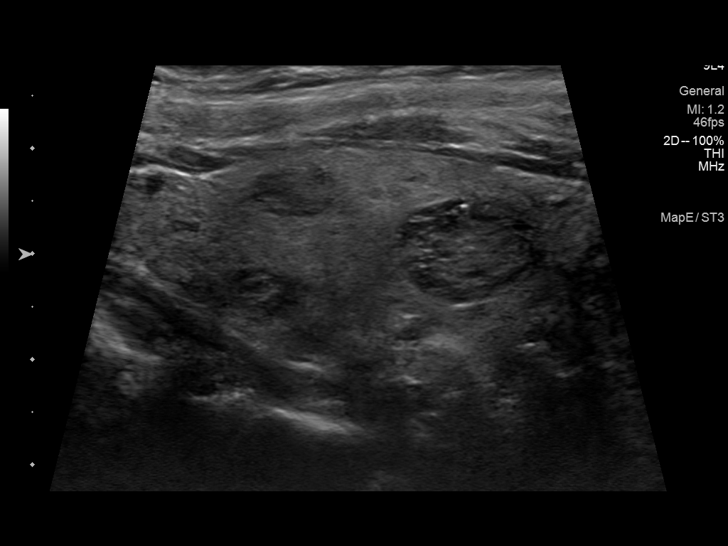
[im 62/68]
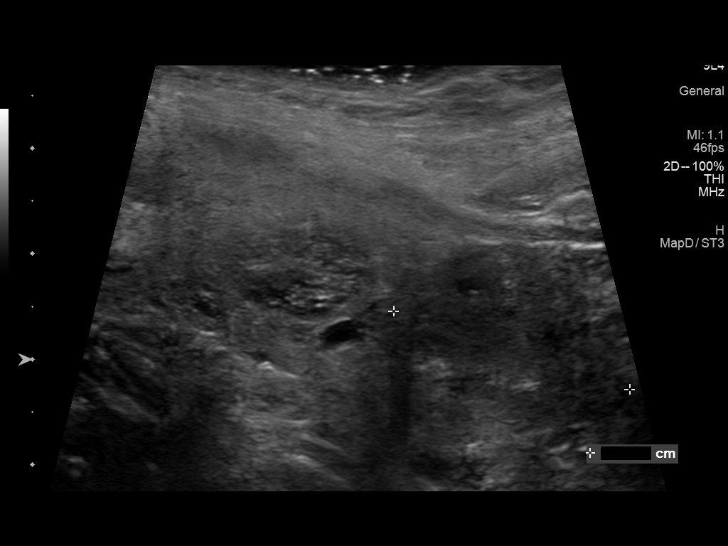
[im 68/68]
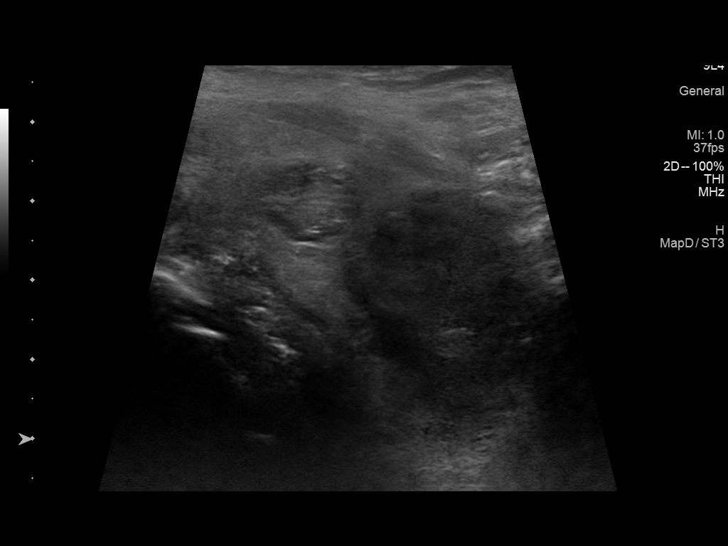

[13 of 25 positions shown; findings below may reference images not displayed]

FINDINGS: Parenchymal Echotexture: Markedly heterogenous

Isthmus: 0.5 cm

Right lobe: 4.4 x 2.6 x 2.7 cm

Left lobe: 4.6 x 2.3 x 2.1 cm

_________________________________________________________

Estimated total number of nodules >/= 1 cm: 3

Number of spongiform nodules >/=  2 cm not described below (TR1): 0

Number of mixed cystic and solid nodules >/= 1.5 cm not described
below (TR2): 0

_________________________________________________________

Nodule # 1: The previously biopsied nodule in the right mid gland is
essentially unchanged at 2.7 x 2.2 x 1.7 cm.

Nodule # 5: Slight interval involution of spongiform nodule in the
left mid gland. This nodule again does not meet criteria to
recommend biopsy or further evaluation.

Nodule # 6: Similar appearance of previously biopsied nodule arising
from the left inferior gland measuring 3.3 x 2.9 x 2.4 cm compared
to 3.4 x 2.6 x 3.3 cm.

Numerous additional small nodules scattered throughout the right and
left gland without significant interval change. No new nodules or
suspicious features identified.
IMPRESSION: Continued stability of enlarged, heterogeneous and multinodular
thyroid gland.

The previously biopsied nodules demonstrate no significant interval
change.

There are no new nodules or interval development of suspicious
features.

## 2021-08-12 DIAGNOSIS — Z940 Kidney transplant status: Secondary | ICD-10-CM | POA: Diagnosis not present

## 2021-08-12 DIAGNOSIS — Z792 Long term (current) use of antibiotics: Secondary | ICD-10-CM | POA: Diagnosis not present

## 2021-08-12 DIAGNOSIS — Z794 Long term (current) use of insulin: Secondary | ICD-10-CM | POA: Diagnosis not present

## 2021-08-12 DIAGNOSIS — Z888 Allergy status to other drugs, medicaments and biological substances status: Secondary | ICD-10-CM | POA: Diagnosis not present

## 2021-08-12 DIAGNOSIS — E119 Type 2 diabetes mellitus without complications: Secondary | ICD-10-CM | POA: Diagnosis not present

## 2021-08-12 DIAGNOSIS — Z4822 Encounter for aftercare following kidney transplant: Secondary | ICD-10-CM | POA: Diagnosis not present

## 2021-08-12 DIAGNOSIS — R0600 Dyspnea, unspecified: Secondary | ICD-10-CM | POA: Diagnosis not present

## 2021-08-12 DIAGNOSIS — D8989 Other specified disorders involving the immune mechanism, not elsewhere classified: Secondary | ICD-10-CM | POA: Diagnosis not present

## 2021-08-12 DIAGNOSIS — I10 Essential (primary) hypertension: Secondary | ICD-10-CM | POA: Diagnosis not present

## 2021-08-12 DIAGNOSIS — I071 Rheumatic tricuspid insufficiency: Secondary | ICD-10-CM | POA: Diagnosis not present

## 2021-08-12 DIAGNOSIS — D84821 Immunodeficiency due to drugs: Secondary | ICD-10-CM | POA: Diagnosis not present

## 2021-08-12 DIAGNOSIS — I770 Arteriovenous fistula, acquired: Secondary | ICD-10-CM | POA: Diagnosis not present

## 2021-08-12 DIAGNOSIS — I7090 Unspecified atherosclerosis: Secondary | ICD-10-CM | POA: Diagnosis not present

## 2021-08-12 DIAGNOSIS — Z79899 Other long term (current) drug therapy: Secondary | ICD-10-CM | POA: Diagnosis not present

## 2021-08-12 DIAGNOSIS — E785 Hyperlipidemia, unspecified: Secondary | ICD-10-CM | POA: Diagnosis not present

## 2021-08-13 DIAGNOSIS — Z4822 Encounter for aftercare following kidney transplant: Secondary | ICD-10-CM | POA: Diagnosis not present

## 2021-08-13 DIAGNOSIS — Z5181 Encounter for therapeutic drug level monitoring: Secondary | ICD-10-CM | POA: Diagnosis not present

## 2021-08-13 DIAGNOSIS — Z79621 Long term (current) use of calcineurin inhibitor: Secondary | ICD-10-CM | POA: Diagnosis not present

## 2021-08-19 DIAGNOSIS — Z940 Kidney transplant status: Secondary | ICD-10-CM | POA: Diagnosis not present

## 2021-08-19 DIAGNOSIS — D849 Immunodeficiency, unspecified: Secondary | ICD-10-CM | POA: Diagnosis not present

## 2021-09-07 DIAGNOSIS — Z20822 Contact with and (suspected) exposure to covid-19: Secondary | ICD-10-CM | POA: Diagnosis not present

## 2021-09-10 DIAGNOSIS — Z792 Long term (current) use of antibiotics: Secondary | ICD-10-CM | POA: Diagnosis not present

## 2021-09-10 DIAGNOSIS — Z79899 Other long term (current) drug therapy: Secondary | ICD-10-CM | POA: Diagnosis not present

## 2021-09-10 DIAGNOSIS — I10 Essential (primary) hypertension: Secondary | ICD-10-CM | POA: Diagnosis not present

## 2021-09-10 DIAGNOSIS — Z794 Long term (current) use of insulin: Secondary | ICD-10-CM | POA: Diagnosis not present

## 2021-09-10 DIAGNOSIS — Z7969 Long term (current) use of other immunomodulators and immunosuppressants: Secondary | ICD-10-CM | POA: Diagnosis not present

## 2021-09-10 DIAGNOSIS — Z7952 Long term (current) use of systemic steroids: Secondary | ICD-10-CM | POA: Diagnosis not present

## 2021-09-10 DIAGNOSIS — Z79621 Long term (current) use of calcineurin inhibitor: Secondary | ICD-10-CM | POA: Diagnosis not present

## 2021-09-10 DIAGNOSIS — Z4822 Encounter for aftercare following kidney transplant: Secondary | ICD-10-CM | POA: Diagnosis not present

## 2021-09-10 DIAGNOSIS — I071 Rheumatic tricuspid insufficiency: Secondary | ICD-10-CM | POA: Diagnosis not present

## 2021-09-10 DIAGNOSIS — E785 Hyperlipidemia, unspecified: Secondary | ICD-10-CM | POA: Diagnosis not present

## 2021-09-10 DIAGNOSIS — E119 Type 2 diabetes mellitus without complications: Secondary | ICD-10-CM | POA: Diagnosis not present

## 2021-09-11 DIAGNOSIS — E119 Type 2 diabetes mellitus without complications: Secondary | ICD-10-CM | POA: Diagnosis not present

## 2021-09-11 DIAGNOSIS — D649 Anemia, unspecified: Secondary | ICD-10-CM | POA: Diagnosis not present

## 2021-09-11 DIAGNOSIS — Z940 Kidney transplant status: Secondary | ICD-10-CM | POA: Diagnosis not present

## 2021-09-11 DIAGNOSIS — E785 Hyperlipidemia, unspecified: Secondary | ICD-10-CM | POA: Diagnosis not present

## 2021-09-11 DIAGNOSIS — I10 Essential (primary) hypertension: Secondary | ICD-10-CM | POA: Diagnosis not present

## 2021-09-11 DIAGNOSIS — D8989 Other specified disorders involving the immune mechanism, not elsewhere classified: Secondary | ICD-10-CM | POA: Diagnosis not present

## 2021-09-11 DIAGNOSIS — R0600 Dyspnea, unspecified: Secondary | ICD-10-CM | POA: Diagnosis not present

## 2021-09-17 DIAGNOSIS — I10 Essential (primary) hypertension: Secondary | ICD-10-CM | POA: Diagnosis not present

## 2021-09-17 DIAGNOSIS — E785 Hyperlipidemia, unspecified: Secondary | ICD-10-CM | POA: Diagnosis not present

## 2021-09-17 DIAGNOSIS — E1129 Type 2 diabetes mellitus with other diabetic kidney complication: Secondary | ICD-10-CM | POA: Diagnosis not present

## 2021-09-17 DIAGNOSIS — M109 Gout, unspecified: Secondary | ICD-10-CM | POA: Diagnosis not present

## 2021-09-17 DIAGNOSIS — E0590 Thyrotoxicosis, unspecified without thyrotoxic crisis or storm: Secondary | ICD-10-CM | POA: Diagnosis not present

## 2021-09-17 DIAGNOSIS — E559 Vitamin D deficiency, unspecified: Secondary | ICD-10-CM | POA: Diagnosis not present

## 2021-09-20 DIAGNOSIS — Z20822 Contact with and (suspected) exposure to covid-19: Secondary | ICD-10-CM | POA: Diagnosis not present

## 2021-09-24 DIAGNOSIS — E039 Hypothyroidism, unspecified: Secondary | ICD-10-CM | POA: Diagnosis not present

## 2021-09-24 DIAGNOSIS — E559 Vitamin D deficiency, unspecified: Secondary | ICD-10-CM | POA: Diagnosis not present

## 2021-09-24 DIAGNOSIS — N1831 Chronic kidney disease, stage 3a: Secondary | ICD-10-CM | POA: Diagnosis not present

## 2021-09-24 DIAGNOSIS — Z1339 Encounter for screening examination for other mental health and behavioral disorders: Secondary | ICD-10-CM | POA: Diagnosis not present

## 2021-09-24 DIAGNOSIS — E1129 Type 2 diabetes mellitus with other diabetic kidney complication: Secondary | ICD-10-CM | POA: Diagnosis not present

## 2021-09-24 DIAGNOSIS — R82998 Other abnormal findings in urine: Secondary | ICD-10-CM | POA: Diagnosis not present

## 2021-09-24 DIAGNOSIS — N2581 Secondary hyperparathyroidism of renal origin: Secondary | ICD-10-CM | POA: Diagnosis not present

## 2021-09-24 DIAGNOSIS — D849 Immunodeficiency, unspecified: Secondary | ICD-10-CM | POA: Diagnosis not present

## 2021-09-24 DIAGNOSIS — M8580 Other specified disorders of bone density and structure, unspecified site: Secondary | ICD-10-CM | POA: Diagnosis not present

## 2021-09-24 DIAGNOSIS — Z20822 Contact with and (suspected) exposure to covid-19: Secondary | ICD-10-CM | POA: Diagnosis not present

## 2021-09-24 DIAGNOSIS — E785 Hyperlipidemia, unspecified: Secondary | ICD-10-CM | POA: Diagnosis not present

## 2021-09-24 DIAGNOSIS — I10 Essential (primary) hypertension: Secondary | ICD-10-CM | POA: Diagnosis not present

## 2021-09-24 DIAGNOSIS — M542 Cervicalgia: Secondary | ICD-10-CM | POA: Diagnosis not present

## 2021-09-24 DIAGNOSIS — Z940 Kidney transplant status: Secondary | ICD-10-CM | POA: Diagnosis not present

## 2021-09-24 DIAGNOSIS — Z0000 Encounter for general adult medical examination without abnormal findings: Secondary | ICD-10-CM | POA: Diagnosis not present

## 2021-09-24 DIAGNOSIS — Z1331 Encounter for screening for depression: Secondary | ICD-10-CM | POA: Diagnosis not present

## 2021-09-24 DIAGNOSIS — Z86010 Personal history of colonic polyps: Secondary | ICD-10-CM | POA: Diagnosis not present

## 2021-10-07 DIAGNOSIS — Z20822 Contact with and (suspected) exposure to covid-19: Secondary | ICD-10-CM | POA: Diagnosis not present

## 2021-10-09 DIAGNOSIS — I10 Essential (primary) hypertension: Secondary | ICD-10-CM | POA: Diagnosis not present

## 2021-10-09 DIAGNOSIS — B258 Other cytomegaloviral diseases: Secondary | ICD-10-CM | POA: Diagnosis not present

## 2021-10-09 DIAGNOSIS — I770 Arteriovenous fistula, acquired: Secondary | ICD-10-CM | POA: Diagnosis not present

## 2021-10-09 DIAGNOSIS — Z940 Kidney transplant status: Secondary | ICD-10-CM | POA: Diagnosis not present

## 2021-10-09 DIAGNOSIS — E119 Type 2 diabetes mellitus without complications: Secondary | ICD-10-CM | POA: Diagnosis not present

## 2021-10-09 DIAGNOSIS — Z794 Long term (current) use of insulin: Secondary | ICD-10-CM | POA: Diagnosis not present

## 2021-10-09 DIAGNOSIS — Z79621 Long term (current) use of calcineurin inhibitor: Secondary | ICD-10-CM | POA: Diagnosis not present

## 2021-10-09 DIAGNOSIS — I361 Nonrheumatic tricuspid (valve) insufficiency: Secondary | ICD-10-CM | POA: Diagnosis not present

## 2021-10-09 DIAGNOSIS — Z792 Long term (current) use of antibiotics: Secondary | ICD-10-CM | POA: Diagnosis not present

## 2021-10-09 DIAGNOSIS — Z4822 Encounter for aftercare following kidney transplant: Secondary | ICD-10-CM | POA: Diagnosis not present

## 2021-10-09 DIAGNOSIS — E785 Hyperlipidemia, unspecified: Secondary | ICD-10-CM | POA: Diagnosis not present

## 2021-10-09 DIAGNOSIS — D8989 Other specified disorders involving the immune mechanism, not elsewhere classified: Secondary | ICD-10-CM | POA: Diagnosis not present

## 2021-10-09 DIAGNOSIS — Z7952 Long term (current) use of systemic steroids: Secondary | ICD-10-CM | POA: Diagnosis not present

## 2021-10-15 ENCOUNTER — Other Ambulatory Visit: Payer: Self-pay

## 2021-10-15 NOTE — Telephone Encounter (Signed)
This encounter was created in error - please disregard.

## 2021-10-26 DIAGNOSIS — Z20822 Contact with and (suspected) exposure to covid-19: Secondary | ICD-10-CM | POA: Diagnosis not present

## 2021-10-28 DIAGNOSIS — Z20822 Contact with and (suspected) exposure to covid-19: Secondary | ICD-10-CM | POA: Diagnosis not present

## 2021-11-06 DIAGNOSIS — R0609 Other forms of dyspnea: Secondary | ICD-10-CM | POA: Diagnosis not present

## 2021-11-06 DIAGNOSIS — Z7952 Long term (current) use of systemic steroids: Secondary | ICD-10-CM | POA: Diagnosis not present

## 2021-11-06 DIAGNOSIS — E785 Hyperlipidemia, unspecified: Secondary | ICD-10-CM | POA: Diagnosis not present

## 2021-11-06 DIAGNOSIS — E11649 Type 2 diabetes mellitus with hypoglycemia without coma: Secondary | ICD-10-CM | POA: Diagnosis not present

## 2021-11-06 DIAGNOSIS — Z940 Kidney transplant status: Secondary | ICD-10-CM | POA: Diagnosis not present

## 2021-11-06 DIAGNOSIS — Z794 Long term (current) use of insulin: Secondary | ICD-10-CM | POA: Diagnosis not present

## 2021-11-06 DIAGNOSIS — B259 Cytomegaloviral disease, unspecified: Secondary | ICD-10-CM | POA: Diagnosis not present

## 2021-11-06 DIAGNOSIS — Z23 Encounter for immunization: Secondary | ICD-10-CM | POA: Insufficient documentation

## 2021-11-06 DIAGNOSIS — Z792 Long term (current) use of antibiotics: Secondary | ICD-10-CM | POA: Diagnosis not present

## 2021-11-06 DIAGNOSIS — Z79899 Other long term (current) drug therapy: Secondary | ICD-10-CM | POA: Diagnosis not present

## 2021-11-06 DIAGNOSIS — Z4822 Encounter for aftercare following kidney transplant: Secondary | ICD-10-CM | POA: Diagnosis not present

## 2021-11-06 DIAGNOSIS — D8989 Other specified disorders involving the immune mechanism, not elsewhere classified: Secondary | ICD-10-CM | POA: Diagnosis not present

## 2021-11-06 DIAGNOSIS — Z7969 Long term (current) use of other immunomodulators and immunosuppressants: Secondary | ICD-10-CM | POA: Diagnosis not present

## 2021-11-06 DIAGNOSIS — I10 Essential (primary) hypertension: Secondary | ICD-10-CM | POA: Diagnosis not present

## 2021-11-06 DIAGNOSIS — E1122 Type 2 diabetes mellitus with diabetic chronic kidney disease: Secondary | ICD-10-CM | POA: Diagnosis not present

## 2021-11-06 DIAGNOSIS — I724 Aneurysm of artery of lower extremity: Secondary | ICD-10-CM | POA: Diagnosis not present

## 2021-11-06 DIAGNOSIS — I770 Arteriovenous fistula, acquired: Secondary | ICD-10-CM | POA: Diagnosis not present

## 2021-11-06 DIAGNOSIS — Z79621 Long term (current) use of calcineurin inhibitor: Secondary | ICD-10-CM | POA: Diagnosis not present

## 2021-12-02 DIAGNOSIS — E11649 Type 2 diabetes mellitus with hypoglycemia without coma: Secondary | ICD-10-CM | POA: Diagnosis not present

## 2021-12-02 DIAGNOSIS — Z940 Kidney transplant status: Secondary | ICD-10-CM | POA: Diagnosis not present

## 2021-12-02 DIAGNOSIS — D8989 Other specified disorders involving the immune mechanism, not elsewhere classified: Secondary | ICD-10-CM | POA: Diagnosis not present

## 2021-12-02 DIAGNOSIS — Z5181 Encounter for therapeutic drug level monitoring: Secondary | ICD-10-CM | POA: Diagnosis not present

## 2021-12-02 DIAGNOSIS — T82898A Other specified complication of vascular prosthetic devices, implants and grafts, initial encounter: Secondary | ICD-10-CM | POA: Insufficient documentation

## 2021-12-02 DIAGNOSIS — Z79899 Other long term (current) drug therapy: Secondary | ICD-10-CM | POA: Diagnosis not present

## 2021-12-02 DIAGNOSIS — Z7952 Long term (current) use of systemic steroids: Secondary | ICD-10-CM | POA: Diagnosis not present

## 2021-12-02 DIAGNOSIS — I770 Arteriovenous fistula, acquired: Secondary | ICD-10-CM | POA: Diagnosis not present

## 2021-12-02 DIAGNOSIS — I10 Essential (primary) hypertension: Secondary | ICD-10-CM | POA: Diagnosis not present

## 2021-12-02 DIAGNOSIS — E785 Hyperlipidemia, unspecified: Secondary | ICD-10-CM | POA: Diagnosis not present

## 2021-12-02 DIAGNOSIS — I721 Aneurysm of artery of upper extremity: Secondary | ICD-10-CM | POA: Diagnosis not present

## 2021-12-02 DIAGNOSIS — E119 Type 2 diabetes mellitus without complications: Secondary | ICD-10-CM | POA: Diagnosis not present

## 2021-12-02 DIAGNOSIS — Z792 Long term (current) use of antibiotics: Secondary | ICD-10-CM | POA: Diagnosis not present

## 2021-12-02 DIAGNOSIS — R0600 Dyspnea, unspecified: Secondary | ICD-10-CM | POA: Diagnosis not present

## 2021-12-02 DIAGNOSIS — I701 Atherosclerosis of renal artery: Secondary | ICD-10-CM | POA: Diagnosis not present

## 2021-12-02 DIAGNOSIS — Z79621 Long term (current) use of calcineurin inhibitor: Secondary | ICD-10-CM | POA: Diagnosis not present

## 2021-12-02 DIAGNOSIS — Z4822 Encounter for aftercare following kidney transplant: Secondary | ICD-10-CM | POA: Diagnosis not present

## 2021-12-02 DIAGNOSIS — R808 Other proteinuria: Secondary | ICD-10-CM | POA: Diagnosis not present

## 2021-12-31 DIAGNOSIS — E785 Hyperlipidemia, unspecified: Secondary | ICD-10-CM | POA: Diagnosis not present

## 2021-12-31 DIAGNOSIS — E1129 Type 2 diabetes mellitus with other diabetic kidney complication: Secondary | ICD-10-CM | POA: Diagnosis not present

## 2021-12-31 DIAGNOSIS — Z794 Long term (current) use of insulin: Secondary | ICD-10-CM | POA: Diagnosis not present

## 2021-12-31 DIAGNOSIS — N1831 Chronic kidney disease, stage 3a: Secondary | ICD-10-CM | POA: Diagnosis not present

## 2021-12-31 DIAGNOSIS — Z940 Kidney transplant status: Secondary | ICD-10-CM | POA: Diagnosis not present

## 2021-12-31 DIAGNOSIS — I10 Essential (primary) hypertension: Secondary | ICD-10-CM | POA: Diagnosis not present

## 2022-01-01 DIAGNOSIS — Z940 Kidney transplant status: Secondary | ICD-10-CM | POA: Diagnosis not present

## 2022-01-01 DIAGNOSIS — D849 Immunodeficiency, unspecified: Secondary | ICD-10-CM | POA: Diagnosis not present

## 2022-01-10 DIAGNOSIS — I770 Arteriovenous fistula, acquired: Secondary | ICD-10-CM | POA: Diagnosis not present

## 2022-01-10 DIAGNOSIS — T82510A Breakdown (mechanical) of surgically created arteriovenous fistula, initial encounter: Secondary | ICD-10-CM | POA: Diagnosis not present

## 2022-01-10 DIAGNOSIS — E119 Type 2 diabetes mellitus without complications: Secondary | ICD-10-CM | POA: Diagnosis not present

## 2022-01-10 DIAGNOSIS — Z794 Long term (current) use of insulin: Secondary | ICD-10-CM | POA: Diagnosis not present

## 2022-01-10 DIAGNOSIS — T82898A Other specified complication of vascular prosthetic devices, implants and grafts, initial encounter: Secondary | ICD-10-CM | POA: Diagnosis not present

## 2022-01-21 DIAGNOSIS — Z794 Long term (current) use of insulin: Secondary | ICD-10-CM | POA: Diagnosis not present

## 2022-01-21 DIAGNOSIS — Z7984 Long term (current) use of oral hypoglycemic drugs: Secondary | ICD-10-CM | POA: Diagnosis not present

## 2022-01-21 DIAGNOSIS — Z940 Kidney transplant status: Secondary | ICD-10-CM | POA: Diagnosis not present

## 2022-01-21 DIAGNOSIS — I129 Hypertensive chronic kidney disease with stage 1 through stage 4 chronic kidney disease, or unspecified chronic kidney disease: Secondary | ICD-10-CM | POA: Diagnosis not present

## 2022-01-21 DIAGNOSIS — Z79899 Other long term (current) drug therapy: Secondary | ICD-10-CM | POA: Diagnosis not present

## 2022-01-21 DIAGNOSIS — E119 Type 2 diabetes mellitus without complications: Secondary | ICD-10-CM | POA: Diagnosis not present

## 2022-02-17 DIAGNOSIS — Z940 Kidney transplant status: Secondary | ICD-10-CM | POA: Diagnosis not present

## 2022-02-17 DIAGNOSIS — N390 Urinary tract infection, site not specified: Secondary | ICD-10-CM | POA: Diagnosis not present

## 2022-02-27 DIAGNOSIS — L821 Other seborrheic keratosis: Secondary | ICD-10-CM | POA: Diagnosis not present

## 2022-02-27 DIAGNOSIS — L570 Actinic keratosis: Secondary | ICD-10-CM | POA: Diagnosis not present

## 2022-02-27 DIAGNOSIS — D225 Melanocytic nevi of trunk: Secondary | ICD-10-CM | POA: Diagnosis not present

## 2022-03-11 DIAGNOSIS — E113293 Type 2 diabetes mellitus with mild nonproliferative diabetic retinopathy without macular edema, bilateral: Secondary | ICD-10-CM | POA: Diagnosis not present

## 2022-03-11 DIAGNOSIS — H02135 Senile ectropion of left lower eyelid: Secondary | ICD-10-CM | POA: Diagnosis not present

## 2022-03-11 DIAGNOSIS — H1132 Conjunctival hemorrhage, left eye: Secondary | ICD-10-CM | POA: Diagnosis not present

## 2022-03-11 DIAGNOSIS — H2513 Age-related nuclear cataract, bilateral: Secondary | ICD-10-CM | POA: Diagnosis not present

## 2022-03-17 DIAGNOSIS — N390 Urinary tract infection, site not specified: Secondary | ICD-10-CM | POA: Diagnosis not present

## 2022-03-17 DIAGNOSIS — Z940 Kidney transplant status: Secondary | ICD-10-CM | POA: Diagnosis not present

## 2022-04-08 DIAGNOSIS — I129 Hypertensive chronic kidney disease with stage 1 through stage 4 chronic kidney disease, or unspecified chronic kidney disease: Secondary | ICD-10-CM | POA: Diagnosis not present

## 2022-04-08 DIAGNOSIS — Z86010 Personal history of colonic polyps: Secondary | ICD-10-CM | POA: Diagnosis not present

## 2022-04-08 DIAGNOSIS — D849 Immunodeficiency, unspecified: Secondary | ICD-10-CM | POA: Diagnosis not present

## 2022-04-08 DIAGNOSIS — I517 Cardiomegaly: Secondary | ICD-10-CM | POA: Diagnosis not present

## 2022-04-08 DIAGNOSIS — E1129 Type 2 diabetes mellitus with other diabetic kidney complication: Secondary | ICD-10-CM | POA: Diagnosis not present

## 2022-04-08 DIAGNOSIS — M8580 Other specified disorders of bone density and structure, unspecified site: Secondary | ICD-10-CM | POA: Diagnosis not present

## 2022-04-08 DIAGNOSIS — N1831 Chronic kidney disease, stage 3a: Secondary | ICD-10-CM | POA: Diagnosis not present

## 2022-04-08 DIAGNOSIS — Z940 Kidney transplant status: Secondary | ICD-10-CM | POA: Diagnosis not present

## 2022-04-08 DIAGNOSIS — E0590 Thyrotoxicosis, unspecified without thyrotoxic crisis or storm: Secondary | ICD-10-CM | POA: Diagnosis not present

## 2022-04-08 DIAGNOSIS — E042 Nontoxic multinodular goiter: Secondary | ICD-10-CM | POA: Diagnosis not present

## 2022-04-08 DIAGNOSIS — N2581 Secondary hyperparathyroidism of renal origin: Secondary | ICD-10-CM | POA: Diagnosis not present

## 2022-04-08 DIAGNOSIS — E785 Hyperlipidemia, unspecified: Secondary | ICD-10-CM | POA: Diagnosis not present

## 2022-04-14 DIAGNOSIS — Z940 Kidney transplant status: Secondary | ICD-10-CM | POA: Diagnosis not present

## 2022-04-14 DIAGNOSIS — N390 Urinary tract infection, site not specified: Secondary | ICD-10-CM | POA: Diagnosis not present

## 2022-04-15 DIAGNOSIS — Z124 Encounter for screening for malignant neoplasm of cervix: Secondary | ICD-10-CM | POA: Diagnosis not present

## 2022-04-15 DIAGNOSIS — Z7952 Long term (current) use of systemic steroids: Secondary | ICD-10-CM | POA: Diagnosis not present

## 2022-04-15 DIAGNOSIS — Z13820 Encounter for screening for osteoporosis: Secondary | ICD-10-CM | POA: Diagnosis not present

## 2022-04-15 DIAGNOSIS — M816 Localized osteoporosis [Lequesne]: Secondary | ICD-10-CM | POA: Diagnosis not present

## 2022-04-15 DIAGNOSIS — N958 Other specified menopausal and perimenopausal disorders: Secondary | ICD-10-CM | POA: Diagnosis not present

## 2022-04-15 DIAGNOSIS — K219 Gastro-esophageal reflux disease without esophagitis: Secondary | ICD-10-CM | POA: Diagnosis not present

## 2022-04-15 DIAGNOSIS — Z1151 Encounter for screening for human papillomavirus (HPV): Secondary | ICD-10-CM | POA: Diagnosis not present

## 2022-04-15 DIAGNOSIS — Z6824 Body mass index (BMI) 24.0-24.9, adult: Secondary | ICD-10-CM | POA: Diagnosis not present

## 2022-04-15 DIAGNOSIS — N186 End stage renal disease: Secondary | ICD-10-CM | POA: Diagnosis not present

## 2022-04-15 DIAGNOSIS — Z1231 Encounter for screening mammogram for malignant neoplasm of breast: Secondary | ICD-10-CM | POA: Diagnosis not present

## 2022-04-29 ENCOUNTER — Encounter: Payer: Self-pay | Admitting: Gastroenterology

## 2022-05-01 DIAGNOSIS — Z794 Long term (current) use of insulin: Secondary | ICD-10-CM | POA: Diagnosis not present

## 2022-05-01 DIAGNOSIS — N390 Urinary tract infection, site not specified: Secondary | ICD-10-CM | POA: Diagnosis not present

## 2022-05-01 DIAGNOSIS — Z940 Kidney transplant status: Secondary | ICD-10-CM | POA: Diagnosis not present

## 2022-05-01 DIAGNOSIS — Z79899 Other long term (current) drug therapy: Secondary | ICD-10-CM | POA: Diagnosis not present

## 2022-05-01 DIAGNOSIS — Z7984 Long term (current) use of oral hypoglycemic drugs: Secondary | ICD-10-CM | POA: Diagnosis not present

## 2022-05-01 DIAGNOSIS — E119 Type 2 diabetes mellitus without complications: Secondary | ICD-10-CM | POA: Diagnosis not present

## 2022-05-01 DIAGNOSIS — I129 Hypertensive chronic kidney disease with stage 1 through stage 4 chronic kidney disease, or unspecified chronic kidney disease: Secondary | ICD-10-CM | POA: Diagnosis not present

## 2022-05-12 DIAGNOSIS — R799 Abnormal finding of blood chemistry, unspecified: Secondary | ICD-10-CM | POA: Diagnosis not present

## 2022-05-12 DIAGNOSIS — N390 Urinary tract infection, site not specified: Secondary | ICD-10-CM | POA: Diagnosis not present

## 2022-05-12 DIAGNOSIS — Z940 Kidney transplant status: Secondary | ICD-10-CM | POA: Diagnosis not present

## 2022-05-19 ENCOUNTER — Telehealth: Payer: Self-pay | Admitting: *Deleted

## 2022-05-19 NOTE — Telephone Encounter (Signed)
Kimberly [name],  This pt has ESRF on hemodialysis, her procedure will need to be done at the hospital.  Thanks,  Osvaldo Angst

## 2022-05-21 NOTE — Telephone Encounter (Signed)
Called patient to cancel PV and procedure and schedule her for an OV. The patient is out of town and says she will call back at later time to reschedule.

## 2022-05-26 NOTE — Telephone Encounter (Signed)
Patient has been scheduled for OV. 07/07/22

## 2022-06-03 DIAGNOSIS — Z4822 Encounter for aftercare following kidney transplant: Secondary | ICD-10-CM | POA: Diagnosis not present

## 2022-06-03 DIAGNOSIS — Z940 Kidney transplant status: Secondary | ICD-10-CM | POA: Diagnosis not present

## 2022-06-03 DIAGNOSIS — I10 Essential (primary) hypertension: Secondary | ICD-10-CM | POA: Diagnosis not present

## 2022-06-03 DIAGNOSIS — D8989 Other specified disorders involving the immune mechanism, not elsewhere classified: Secondary | ICD-10-CM | POA: Diagnosis not present

## 2022-06-03 DIAGNOSIS — E785 Hyperlipidemia, unspecified: Secondary | ICD-10-CM | POA: Diagnosis not present

## 2022-06-03 DIAGNOSIS — R0609 Other forms of dyspnea: Secondary | ICD-10-CM | POA: Diagnosis not present

## 2022-06-03 DIAGNOSIS — Z7952 Long term (current) use of systemic steroids: Secondary | ICD-10-CM | POA: Diagnosis not present

## 2022-06-03 DIAGNOSIS — Z792 Long term (current) use of antibiotics: Secondary | ICD-10-CM | POA: Diagnosis not present

## 2022-06-03 DIAGNOSIS — R808 Other proteinuria: Secondary | ICD-10-CM | POA: Diagnosis not present

## 2022-06-03 DIAGNOSIS — E109 Type 1 diabetes mellitus without complications: Secondary | ICD-10-CM | POA: Diagnosis not present

## 2022-06-03 DIAGNOSIS — E119 Type 2 diabetes mellitus without complications: Secondary | ICD-10-CM | POA: Diagnosis not present

## 2022-06-03 DIAGNOSIS — B258 Other cytomegaloviral diseases: Secondary | ICD-10-CM | POA: Diagnosis not present

## 2022-06-03 DIAGNOSIS — I770 Arteriovenous fistula, acquired: Secondary | ICD-10-CM | POA: Diagnosis not present

## 2022-06-03 DIAGNOSIS — Z79621 Long term (current) use of calcineurin inhibitor: Secondary | ICD-10-CM | POA: Diagnosis not present

## 2022-06-10 ENCOUNTER — Encounter: Payer: Medicare Other | Admitting: Gastroenterology

## 2022-06-11 DIAGNOSIS — Z940 Kidney transplant status: Secondary | ICD-10-CM | POA: Diagnosis not present

## 2022-06-11 DIAGNOSIS — N390 Urinary tract infection, site not specified: Secondary | ICD-10-CM | POA: Diagnosis not present

## 2022-07-01 DIAGNOSIS — E1129 Type 2 diabetes mellitus with other diabetic kidney complication: Secondary | ICD-10-CM | POA: Diagnosis not present

## 2022-07-01 DIAGNOSIS — I129 Hypertensive chronic kidney disease with stage 1 through stage 4 chronic kidney disease, or unspecified chronic kidney disease: Secondary | ICD-10-CM | POA: Diagnosis not present

## 2022-07-01 DIAGNOSIS — N1831 Chronic kidney disease, stage 3a: Secondary | ICD-10-CM | POA: Diagnosis not present

## 2022-07-01 DIAGNOSIS — Z794 Long term (current) use of insulin: Secondary | ICD-10-CM | POA: Diagnosis not present

## 2022-07-01 DIAGNOSIS — E785 Hyperlipidemia, unspecified: Secondary | ICD-10-CM | POA: Diagnosis not present

## 2022-07-01 DIAGNOSIS — Z940 Kidney transplant status: Secondary | ICD-10-CM | POA: Diagnosis not present

## 2022-07-02 DIAGNOSIS — M810 Age-related osteoporosis without current pathological fracture: Secondary | ICD-10-CM | POA: Diagnosis not present

## 2022-07-07 ENCOUNTER — Ambulatory Visit: Payer: Medicare Other | Admitting: Gastroenterology

## 2022-07-10 ENCOUNTER — Encounter (HOSPITAL_COMMUNITY): Payer: Self-pay

## 2022-07-14 DIAGNOSIS — N390 Urinary tract infection, site not specified: Secondary | ICD-10-CM | POA: Diagnosis not present

## 2022-07-14 DIAGNOSIS — Z940 Kidney transplant status: Secondary | ICD-10-CM | POA: Diagnosis not present

## 2022-07-16 ENCOUNTER — Ambulatory Visit (INDEPENDENT_AMBULATORY_CARE_PROVIDER_SITE_OTHER): Payer: Medicare Other

## 2022-07-16 ENCOUNTER — Encounter: Payer: Self-pay | Admitting: Podiatry

## 2022-07-16 ENCOUNTER — Ambulatory Visit (INDEPENDENT_AMBULATORY_CARE_PROVIDER_SITE_OTHER): Payer: Medicare Other | Admitting: Podiatry

## 2022-07-16 DIAGNOSIS — M76812 Anterior tibial syndrome, left leg: Secondary | ICD-10-CM

## 2022-07-16 DIAGNOSIS — M21619 Bunion of unspecified foot: Secondary | ICD-10-CM

## 2022-07-16 DIAGNOSIS — M21612 Bunion of left foot: Secondary | ICD-10-CM

## 2022-07-16 MED ORDER — TRIAMCINOLONE ACETONIDE 10 MG/ML IJ SUSP
10.0000 mg | Freq: Once | INTRAMUSCULAR | Status: AC
  Administered 2022-07-16: 10 mg

## 2022-07-16 NOTE — Progress Notes (Signed)
Subjective:   Patient ID: Kimberly [name], female   DOB: 67 y.o.   MRN: [id number]   HPI Patient states she developed pain in her left midfoot over the last few weeks and it is sore when she tries to walk.  Patient states she does not remember any injury and did have a kidney transplant in the last 2 years.  Patient does not smoke likes to be active   Review of Systems  All other systems reviewed and are negative.       Objective:  Physical Exam Vitals and nursing note reviewed.  Constitutional:      Appearance: She is well-developed.  Pulmonary:     Effort: Pulmonary effort is normal.  Musculoskeletal:        General: Normal range of motion.  Skin:    General: Skin is warm.  Neurological:     Mental Status: She is alert.     Neurovascular status found to be intact muscle strength was found to be adequate range of motion adequate with inflammation pain of the anterior tibial tendon at its insertion left with mild depression of the arch no indication of other pathology with no current indication of pathology first MPJ with mild bunion deformity.  Patient has good digital perfusion well-oriented     Assessment:  Probability for acute anterior tibial tendinitis left with mild depression of the arch with mild bunion deformity     Plan:  H&P reviewed and explained injection treatment to try to reduce the inflammation explaining risk and also fascial brace.  I did do careful sterile prep and then injected the sheath with 3 mg Dexasone Kenalog 5 mg Xylocaine and then applied fascial brace but she does not want it and denies wanting this treatment to hold up her arch.  I did discuss using good supportive therapy and if any issues were to occur or if it were to come back patient is to reappoint

## 2022-07-17 DIAGNOSIS — M810 Age-related osteoporosis without current pathological fracture: Secondary | ICD-10-CM | POA: Diagnosis not present

## 2022-07-24 DIAGNOSIS — Z794 Long term (current) use of insulin: Secondary | ICD-10-CM | POA: Diagnosis not present

## 2022-07-24 DIAGNOSIS — I129 Hypertensive chronic kidney disease with stage 1 through stage 4 chronic kidney disease, or unspecified chronic kidney disease: Secondary | ICD-10-CM | POA: Diagnosis not present

## 2022-07-24 DIAGNOSIS — E119 Type 2 diabetes mellitus without complications: Secondary | ICD-10-CM | POA: Diagnosis not present

## 2022-07-24 DIAGNOSIS — Z7984 Long term (current) use of oral hypoglycemic drugs: Secondary | ICD-10-CM | POA: Diagnosis not present

## 2022-07-24 DIAGNOSIS — Z940 Kidney transplant status: Secondary | ICD-10-CM | POA: Diagnosis not present

## 2022-07-24 DIAGNOSIS — Z79899 Other long term (current) drug therapy: Secondary | ICD-10-CM | POA: Diagnosis not present

## 2022-07-24 DIAGNOSIS — M810 Age-related osteoporosis without current pathological fracture: Secondary | ICD-10-CM | POA: Diagnosis not present

## 2022-07-31 ENCOUNTER — Encounter: Payer: Self-pay | Admitting: Gastroenterology

## 2022-07-31 ENCOUNTER — Encounter (HOSPITAL_COMMUNITY): Payer: Self-pay

## 2022-07-31 ENCOUNTER — Ambulatory Visit (INDEPENDENT_AMBULATORY_CARE_PROVIDER_SITE_OTHER): Payer: Medicare Other | Admitting: Gastroenterology

## 2022-07-31 VITALS — BP 122/60 | HR 62 | Ht 66.0 in | Wt 149.0 lb

## 2022-07-31 DIAGNOSIS — Z86010 Personal history of colonic polyps: Secondary | ICD-10-CM | POA: Diagnosis not present

## 2022-07-31 MED ORDER — NA SULFATE-K SULFATE-MG SULF 17.5-3.13-1.6 GM/177ML PO SOLN: 1.0000 | Freq: Once | ORAL | 0 refills | Status: AC

## 2022-07-31 NOTE — Patient Instructions (Signed)
You have been scheduled for a colonoscopy. Please follow written instructions given to you at your visit today.  Please pick up your prep supplies at the pharmacy within the next 1-3 days. If you use inhalers (even only as needed), please bring them with you on the day of your procedure.  The [facility] providers would like to encourage you to use MYCHART to communicate with providers for non-urgent requests or questions.  Due to long hold times on the telephone, sending your provider a message by MYCHART may be a faster and more efficient way to get a response.  Please allow 48 business hours for a response.  Please remember that this is for non-urgent requests.   Due to recent changes in healthcare laws, you may see the results of your imaging and laboratory studies on MyChart before your provider has had a chance to review them.  We understand that in some cases there may be results that are confusing or concerning to you. Not all laboratory results come back in the same time frame and the provider may be waiting for multiple results in order to interpret others.  Please give us 48 hours in order for your provider to thoroughly review all the results before contacting the office for clarification of your results.   Thank you for choosing me and  Gastroenterology.  Malcolm T. Stark, Jr., MD., FACG  

## 2022-07-31 NOTE — Progress Notes (Addendum)
Assessment    Personal history of multiple (5) adenomatous colon polyps in 2014 ESRD S/P transplant 2022, not on HD DM  Recommendations    Schedule colonoscopy. The risks (including bleeding, perforation, infection, missed lesions, medication reactions and possible hospitalization or surgery if complications occur), benefits, and alternatives to colonoscopy with possible biopsy and possible polypectomy were discussed with the patient and they consent to proceed.      HPI   Chief complaint: Personal history of adenomatous colon polyps  Patient profile:  Kimberly [name] is a 67 y.o. female referred by Martha Clan, MD for personal history of adenomatous colon polyps.  She underwent colonoscopy in 2014 with 5 small tubular adenomas.  Colonoscopy in 2017 was polyp free and other findings include a small ulcer on her IC valve, left colon diverticulosis with associated mild colitis, anal condyloma and internal hemorrhoids.  Path showed nonspecific acute inflammatory changes. She is status post renal transplant x 2, most recently in June 2022. She is not been on HD since June 2022 CBC in December 2023 showed a normal hemoglobin at 13.6, BUN=25, Cr=1.48. Denies weight loss, abdominal pain, constipation, diarrhea, change in stool caliber, melena, hematochezia, nausea, vomiting, dysphagia, reflux symptoms, chest pain.   Previous Labs / Imaging::  See HPI   Previous GI evaluation    Endoscopies:  See HPI  Imaging:     Past Medical History:  Diagnosis Date   Adenomatous colon polyp 08/2007   Anemia    Diabetes mellitus type 2, controlled (HCC)    type 2   Dialysis patient (HCC) 01/2017   M-W-F   End stage renal disease (HCC)     diaylis sunday tuesdays and thurs does at home   Goiter    Gout    Hyperlipidemia    Hypertension    Hyperthyroidism    Kidney disease    Kidney transplant failure    8 years ago   Osteopenia    Pneumonia    2018   Seasonal allergies     Vitamin B12 deficiency anemia due to intrinsic factor deficiency    Past Surgical History:  Procedure Laterality Date   A/V FISTULAGRAM N/A 10/03/2019   Procedure: A/V FISTULAGRAM - Left;  Surgeon: Maeola Harman, MD;  Location: [facility] INVASIVE CV LAB;  Service: Cardiovascular;  Laterality: N/A;   AV FISTULA PLACEMENT Left 2008   colonscopy  02/2016   FISTULOGRAM Left 05/18/2019   Procedure: LEFT ARM FISTULOGRAM WITH BALLOON ANGIOPLASTY OF LEFT cephalic arch with 8mm balloon;  Surgeon: Maeola Harman, MD;  Location: [facility] OR;  Service: Vascular;  Laterality: Left;   GANGLION CYST EXCISION Right 1994   KIDNEY TRANSPLANT  June 2010   MINOR FULGERATION OF ANAL CONDYLOMA N/A 07/31/2017   Procedure: EXCISION  FULGERATION OF ANAL CONDYLOMA;  Surgeon: Andria Meuse, MD;  Location: [facility] ORS;  Service: General;  Laterality: N/A;   PERIPHERAL VASCULAR BALLOON ANGIOPLASTY Left 10/03/2019   Procedure: PERIPHERAL VASCULAR BALLOON ANGIOPLASTY;  Surgeon: Maeola Harman, MD;  Location: [facility] INVASIVE CV LAB;  Service: Cardiovascular;  Laterality: Left;  AVF   RECTAL EXAM UNDER ANESTHESIA N/A 07/03/2016   Procedure: RECTAL EXAM UNDER ANESTHESIA;  Surgeon: Gaynelle Adu, MD;  Location: [facility] ORS;  Service: General;  Laterality: N/A;   REVISON OF ARTERIOVENOUS FISTULA Left 05/18/2019   Procedure: revision Of  left upper arm Arteriovenous Fistula with excision of aneurysm;  Surgeon: Maeola Harman, MD;  Location: [facility] OR;  Service: Vascular;  Laterality: Left;   SHOULDER ARTHROSCOPY W/ ROTATOR CUFF REPAIR Right yrs ago   UPPER GI ENDOSCOPY  sept   2017   WART FULGURATION N/A 07/03/2016   Procedure: EXCISION FULGURATION ANAL WART EXCISIONAL BIOSY OF EXTERNAL HEMORROID;  Surgeon: Gaynelle Adu, MD;  Location: [facility] ORS;  Service: General;  Laterality: N/A;   Family History  Problem Relation Age of Onset   Heart disease Mother    Cancer - Other Father        gum to throat   Colon cancer  Sister    Colon cancer Brother    Esophageal cancer Neg Hx    Stomach cancer Neg Hx    Social History   Tobacco Use   Smoking status: Former    Packs/day: 1.00    Years: 20.00    Total pack years: 20.00    Types: Cigarettes    Quit date: 07/09/1997    Years since quitting: 25.0   Smokeless tobacco: Never  Vaping Use   Vaping Use: Never used  Substance Use Topics   Alcohol use: No   Drug use: No   Current Outpatient Medications  Medication Sig Dispense Refill   acetaminophen (TYLENOL) 500 MG tablet Take by mouth.     amLODipine (NORVASC) 10 MG tablet Take 10 mg by mouth every evening.      amoxicillin (AMOXIL) 500 MG capsule Take 2,000 mg by mouth See admin instructions. Take 4 capsules (2000 mg) by mouth 1 hour prior to dental work     ezetimibe-simvastatin (VYTORIN) 10-20 MG per tablet Take 1 tablet by mouth at bedtime.     glucose blood (ONETOUCH VERIO) test strip Check sugars up to 3 times a day Dx: Diabetes on insulin tx with labile sugars due to ESRD/dialysis E11.319     insulin aspart (FIASP FLEXTOUCH) 100 UNIT/ML FlexTouch Pen Inject into the skin. Inject 7 units in skin in the morning and at lunchtime. Inject 5 units in the evening with meals     labetalol (NORMODYNE) 200 MG tablet Take 1 tablet (200 mg total) by mouth 2 (two) times daily. (Patient taking differently: Take 300 mg by mouth 2 (two) times daily.) 60 tablet 0   magnesium oxide (MAG-OX) 400 (240 Mg) MG tablet Take 800 mg by mouth 2 (two) times daily.     pantoprazole (PROTONIX) 40 MG tablet Take 40 mg by mouth daily.     predniSONE (DELTASONE) 5 MG tablet Take 5 mg by mouth daily.     sulfamethoxazole-trimethoprim (BACTRIM) 400-80 MG tablet Take 1 tablet by mouth 3 (three) times a week.     tacrolimus (PROGRAF) 1 MG capsule 5 capsules in the morning and 4 capsules in the evening     TOUJEO MAX SOLOSTAR 300 UNIT/ML SOPN Inject 16 Units into the skin every evening.     mycophenolate (MYFORTIC) 180 MG EC tablet  Take 180 mg by mouth 2 (two) times daily. (Patient not taking: Reported on 07/31/2022)     No current facility-administered medications for this visit.   Allergies  Allergen Reactions   Sucroferric Oxyhydroxide     Other reaction(s): Flushing (Red Skin)   Other Hives    tape   Phoslo  [Calcium Acetate] Rash    Review of Systems: All other systems reviewed and negative except where noted in HPI.    Physical Exam    Wt Readings from Last 3 Encounters:  07/31/22 149 lb (67.6 kg)  11/13/20 154 lb 4.8 oz (70 kg)  11/18/19  166 lb (75.3 kg)    BP 122/60   Pulse 62   Ht 5\' 6"  (1.676 m)   Wt 149 lb (67.6 kg)   BMI 24.05 kg/m  Constitutional:  Generally well appearing female in no acute distress. HEENT: Pupils normal.  Conjunctivae are normal. No scleral icterus. No oral lesions or deformities noted.  Neck: Supple.  Cardiac: Normal rate, regular rhythm. Systolic murmur. Pulmonary/chest: Effort normal and breath sounds normal. No wheezing, rales or rhonchi. Abdominal: Soft, nondistended, nontender. Active bowel sounds. No palpable HSM, masses or hernias. Rectal: Not done Extremities: No edema or deformities noted. Left arm AV shunt in place Neurological: Alert and oriented to person, place and time. Psychiatric: Pleasant. Normal mood and affect. Behavior is normal. Skin: Skin is warm and dry. No rashes noted.  Claudette Head, MD   cc:  Referring Provider Cleatis Polka., MD

## 2022-08-04 DIAGNOSIS — M7742 Metatarsalgia, left foot: Secondary | ICD-10-CM | POA: Diagnosis not present

## 2022-08-04 DIAGNOSIS — M810 Age-related osteoporosis without current pathological fracture: Secondary | ICD-10-CM | POA: Diagnosis not present

## 2022-08-11 DIAGNOSIS — Z7952 Long term (current) use of systemic steroids: Secondary | ICD-10-CM | POA: Diagnosis not present

## 2022-08-11 DIAGNOSIS — E11649 Type 2 diabetes mellitus with hypoglycemia without coma: Secondary | ICD-10-CM | POA: Diagnosis not present

## 2022-08-11 DIAGNOSIS — Z792 Long term (current) use of antibiotics: Secondary | ICD-10-CM | POA: Diagnosis not present

## 2022-08-11 DIAGNOSIS — Z4822 Encounter for aftercare following kidney transplant: Secondary | ICD-10-CM | POA: Diagnosis not present

## 2022-08-11 DIAGNOSIS — R0600 Dyspnea, unspecified: Secondary | ICD-10-CM | POA: Diagnosis not present

## 2022-08-11 DIAGNOSIS — Z79621 Long term (current) use of calcineurin inhibitor: Secondary | ICD-10-CM | POA: Diagnosis not present

## 2022-08-11 DIAGNOSIS — Z5181 Encounter for therapeutic drug level monitoring: Secondary | ICD-10-CM | POA: Diagnosis not present

## 2022-08-11 DIAGNOSIS — Z978 Presence of other specified devices: Secondary | ICD-10-CM | POA: Diagnosis not present

## 2022-08-11 DIAGNOSIS — Z794 Long term (current) use of insulin: Secondary | ICD-10-CM | POA: Diagnosis not present

## 2022-08-11 DIAGNOSIS — Z79624 Long term (current) use of inhibitors of nucleotide synthesis: Secondary | ICD-10-CM | POA: Diagnosis not present

## 2022-08-11 DIAGNOSIS — I151 Hypertension secondary to other renal disorders: Secondary | ICD-10-CM | POA: Diagnosis not present

## 2022-08-11 DIAGNOSIS — Z940 Kidney transplant status: Secondary | ICD-10-CM | POA: Diagnosis not present

## 2022-08-11 DIAGNOSIS — E119 Type 2 diabetes mellitus without complications: Secondary | ICD-10-CM | POA: Diagnosis not present

## 2022-08-11 DIAGNOSIS — B349 Viral infection, unspecified: Secondary | ICD-10-CM | POA: Diagnosis not present

## 2022-08-11 DIAGNOSIS — Z79899 Other long term (current) drug therapy: Secondary | ICD-10-CM | POA: Diagnosis not present

## 2022-08-11 DIAGNOSIS — I10 Essential (primary) hypertension: Secondary | ICD-10-CM | POA: Diagnosis not present

## 2022-08-11 DIAGNOSIS — E785 Hyperlipidemia, unspecified: Secondary | ICD-10-CM | POA: Diagnosis not present

## 2022-08-11 DIAGNOSIS — B348 Other viral infections of unspecified site: Secondary | ICD-10-CM | POA: Diagnosis not present

## 2022-08-11 DIAGNOSIS — I770 Arteriovenous fistula, acquired: Secondary | ICD-10-CM | POA: Diagnosis not present

## 2022-08-12 DIAGNOSIS — Z23 Encounter for immunization: Secondary | ICD-10-CM | POA: Diagnosis not present

## 2022-08-25 DIAGNOSIS — Z940 Kidney transplant status: Secondary | ICD-10-CM | POA: Diagnosis not present

## 2022-08-27 ENCOUNTER — Telehealth: Payer: Self-pay | Admitting: *Deleted

## 2022-09-03 ENCOUNTER — Encounter: Payer: Medicare Other | Admitting: Gastroenterology

## 2022-09-08 DIAGNOSIS — Z7952 Long term (current) use of systemic steroids: Secondary | ICD-10-CM | POA: Diagnosis not present

## 2022-09-08 DIAGNOSIS — Z940 Kidney transplant status: Secondary | ICD-10-CM | POA: Diagnosis not present

## 2022-09-08 DIAGNOSIS — Z792 Long term (current) use of antibiotics: Secondary | ICD-10-CM | POA: Diagnosis not present

## 2022-09-08 DIAGNOSIS — I10 Essential (primary) hypertension: Secondary | ICD-10-CM | POA: Diagnosis not present

## 2022-09-08 DIAGNOSIS — B348 Other viral infections of unspecified site: Secondary | ICD-10-CM | POA: Diagnosis not present

## 2022-09-08 DIAGNOSIS — Z23 Encounter for immunization: Secondary | ICD-10-CM | POA: Diagnosis not present

## 2022-09-08 DIAGNOSIS — Z8619 Personal history of other infectious and parasitic diseases: Secondary | ICD-10-CM | POA: Diagnosis not present

## 2022-09-08 DIAGNOSIS — Z5181 Encounter for therapeutic drug level monitoring: Secondary | ICD-10-CM | POA: Diagnosis not present

## 2022-09-08 DIAGNOSIS — E785 Hyperlipidemia, unspecified: Secondary | ICD-10-CM | POA: Diagnosis not present

## 2022-09-08 DIAGNOSIS — Z79621 Long term (current) use of calcineurin inhibitor: Secondary | ICD-10-CM | POA: Diagnosis not present

## 2022-09-08 DIAGNOSIS — Z4822 Encounter for aftercare following kidney transplant: Secondary | ICD-10-CM | POA: Diagnosis not present

## 2022-09-08 DIAGNOSIS — E119 Type 2 diabetes mellitus without complications: Secondary | ICD-10-CM | POA: Diagnosis not present

## 2022-09-08 DIAGNOSIS — Z79624 Long term (current) use of inhibitors of nucleotide synthesis: Secondary | ICD-10-CM | POA: Diagnosis not present

## 2022-09-08 DIAGNOSIS — Z794 Long term (current) use of insulin: Secondary | ICD-10-CM | POA: Diagnosis not present

## 2022-09-08 DIAGNOSIS — I770 Arteriovenous fistula, acquired: Secondary | ICD-10-CM | POA: Diagnosis not present

## 2022-09-08 DIAGNOSIS — Z79899 Other long term (current) drug therapy: Secondary | ICD-10-CM | POA: Diagnosis not present

## 2022-09-08 DIAGNOSIS — D84821 Immunodeficiency due to drugs: Secondary | ICD-10-CM | POA: Diagnosis not present

## 2022-09-22 DIAGNOSIS — Z940 Kidney transplant status: Secondary | ICD-10-CM | POA: Diagnosis not present

## 2022-09-30 ENCOUNTER — Telehealth: Payer: Self-pay

## 2022-09-30 NOTE — Telephone Encounter (Signed)
Called at [phone] & [phone] and lest messages both times.  Unable to reach patient but left a message letting her know that she needed to call us by the end of day to reschedule her PV or her colonoscopy would be cancelled.

## 2022-09-30 NOTE — Telephone Encounter (Signed)
Patient did not call back by 5pm and reschedule PV, so the colonoscopy has been cancelled and the no show letter has been mailed.

## 2022-10-06 DIAGNOSIS — I10 Essential (primary) hypertension: Secondary | ICD-10-CM | POA: Diagnosis not present

## 2022-10-06 DIAGNOSIS — Z794 Long term (current) use of insulin: Secondary | ICD-10-CM | POA: Diagnosis not present

## 2022-10-06 DIAGNOSIS — E1122 Type 2 diabetes mellitus with diabetic chronic kidney disease: Secondary | ICD-10-CM | POA: Diagnosis not present

## 2022-10-06 DIAGNOSIS — Z940 Kidney transplant status: Secondary | ICD-10-CM | POA: Diagnosis not present

## 2022-10-06 DIAGNOSIS — N186 End stage renal disease: Secondary | ICD-10-CM | POA: Diagnosis not present

## 2022-10-06 DIAGNOSIS — D849 Immunodeficiency, unspecified: Secondary | ICD-10-CM | POA: Diagnosis not present

## 2022-10-06 DIAGNOSIS — Z992 Dependence on renal dialysis: Secondary | ICD-10-CM | POA: Diagnosis not present

## 2022-10-15 ENCOUNTER — Encounter: Payer: Self-pay | Admitting: Gastroenterology

## 2022-10-15 ENCOUNTER — Encounter: Payer: Medicare Other | Admitting: Gastroenterology

## 2022-10-15 ENCOUNTER — Ambulatory Visit (AMBULATORY_SURGERY_CENTER): Payer: Medicare Other | Admitting: Gastroenterology

## 2022-10-15 VITALS — BP 142/75 | HR 63 | Temp 97.5°F | Resp 18 | Ht 66.0 in | Wt 149.0 lb

## 2022-10-15 DIAGNOSIS — D125 Benign neoplasm of sigmoid colon: Secondary | ICD-10-CM | POA: Diagnosis not present

## 2022-10-15 DIAGNOSIS — D123 Benign neoplasm of transverse colon: Secondary | ICD-10-CM

## 2022-10-15 DIAGNOSIS — Z09 Encounter for follow-up examination after completed treatment for conditions other than malignant neoplasm: Secondary | ICD-10-CM

## 2022-10-15 DIAGNOSIS — I10 Essential (primary) hypertension: Secondary | ICD-10-CM | POA: Diagnosis not present

## 2022-10-15 DIAGNOSIS — D122 Benign neoplasm of ascending colon: Secondary | ICD-10-CM | POA: Diagnosis not present

## 2022-10-15 DIAGNOSIS — Z86010 Personal history of colonic polyps: Secondary | ICD-10-CM

## 2022-10-15 DIAGNOSIS — E119 Type 2 diabetes mellitus without complications: Secondary | ICD-10-CM | POA: Diagnosis not present

## 2022-10-15 DIAGNOSIS — D120 Benign neoplasm of cecum: Secondary | ICD-10-CM | POA: Diagnosis not present

## 2022-10-15 MED ORDER — SODIUM CHLORIDE 0.9 % IV SOLN
500.0000 mL | Freq: Once | INTRAVENOUS | Status: DC
Start: 2022-10-15 — End: 2024-04-13

## 2022-10-15 NOTE — Progress Notes (Signed)
History & Physical  Primary Care Physician:  Cleatis Polka., MD Primary Gastroenterologist: Claudette Head, MD  Impression / Plan:  Personal history of multiple adenomatous colon polyps for colonoscopy.  CHIEF COMPLAINT:  Personal history of colon polyps   HPI: Kimberly [name] is a 67 y.o. female with a personal history of multiple adenomatous colon polyps for colonoscopy.   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     Past Surgical History:  Procedure Laterality Date   A/V FISTULAGRAM N/A 10/03/2019   Procedure: A/V FISTULAGRAM - Left;  Surgeon: Maeola Harman, MD;  Location: [facility] INVASIVE CV LAB;  Service: Cardiovascular;  Laterality: N/A;   AV FISTULA PLACEMENT Left 2008   colonscopy  02/2016   FISTULOGRAM Left 05/18/2019   Procedure: LEFT ARM FISTULOGRAM WITH BALLOON ANGIOPLASTY OF LEFT cephalic arch with 8mm balloon;  Surgeon: Maeola Harman, MD;  Location: [facility] OR;  Service: Vascular;  Laterality: Left;   GANGLION CYST EXCISION Right 1994   KIDNEY TRANSPLANT  June 2010   MINOR FULGERATION OF ANAL CONDYLOMA N/A 07/31/2017   Procedure: EXCISION  FULGERATION OF ANAL CONDYLOMA;  Surgeon: Andria Meuse, MD;  Location: [facility] ORS;  Service: General;  Laterality: N/A;   PERIPHERAL VASCULAR BALLOON ANGIOPLASTY Left 10/03/2019   Procedure: PERIPHERAL VASCULAR BALLOON ANGIOPLASTY;  Surgeon: Maeola Harman, MD;  Location: [facility] INVASIVE CV LAB;  Service: Cardiovascular;  Laterality:  Left;  AVF   RECTAL EXAM UNDER ANESTHESIA N/A 07/03/2016   Procedure: RECTAL EXAM UNDER ANESTHESIA;  Surgeon: Gaynelle Adu, MD;  Location: [facility] ORS;  Service: General;  Laterality: N/A;   REVISON OF ARTERIOVENOUS FISTULA Left 05/18/2019   Procedure: revision Of  left upper arm Arteriovenous Fistula with excision of aneurysm;  Surgeon: Maeola Harman, MD;  Location: [facility] OR;  Service: Vascular;  Laterality: Left;   SHOULDER ARTHROSCOPY W/ ROTATOR CUFF REPAIR Right yrs ago   UPPER GI ENDOSCOPY  sept   2017   WART FULGURATION N/A 07/03/2016   Procedure: EXCISION FULGURATION ANAL WART EXCISIONAL BIOSY OF EXTERNAL HEMORROID;  Surgeon: Gaynelle Adu, MD;  Location: [facility] ORS;  Service: General;  Laterality: N/A;    Prior to Admission medications   Medication Sig Start Date End Date Taking? Authorizing Provider  acetaminophen (TYLENOL) 500 MG tablet Take by mouth.    [provider]  amLODipine (NORVASC) 10 MG tablet Take 10 mg by mouth every evening.     [provider]  amoxicillin (AMOXIL) 500 MG capsule Take 2,000 mg by mouth See admin instructions. Take 4 capsules (2000 mg) by mouth 1 hour prior to dental work 04/26/19   [provider]  ezetimibe-simvastatin (VYTORIN) 10-20 MG per tablet Take 1 tablet by mouth at bedtime.    [provider]  glucose blood (ONETOUCH VERIO) test strip Check sugars up to 3 times  a day Dx: Diabetes on insulin tx with labile sugars due to ESRD/dialysis E11.319 10/18/18   [provider]  insulin aspart (FIASP FLEXTOUCH) 100 UNIT/ML FlexTouch Pen Inject into the skin. Inject 7 units in skin in the morning and at lunchtime. Inject 5 units in the evening with meals    [provider]  labetalol (NORMODYNE) 200 MG tablet Take 1 tablet (200 mg total) by mouth 2 (two) times daily. Patient taking differently: Take 300 mg by mouth 2 (two) times daily. 06/25/16   Noralee Stain, DO  magnesium oxide (MAG-OX) 400 (240 Mg) MG  tablet Take 800 mg by mouth 2 (two) times daily.    [provider]  mycophenolate (MYFORTIC) 180 MG EC tablet Take 180 mg by mouth 2 (two) times daily. Patient not taking: Reported on 07/31/2022    [provider]  pantoprazole (PROTONIX) 40 MG tablet Take 40 mg by mouth daily.    [provider]  predniSONE (DELTASONE) 5 MG tablet Take 5 mg by mouth daily.    [provider]  sulfamethoxazole-trimethoprim (BACTRIM) 400-80 MG tablet Take 1 tablet by mouth 3 (three) times a week.    [provider]  tacrolimus (PROGRAF) 1 MG capsule 5 capsules in the morning and 4 capsules in the evening    [provider]  TOUJEO MAX SOLOSTAR 300 UNIT/ML SOPN Inject 16 Units into the skin every evening. 03/09/19   [provider]    Current Outpatient Medications  Medication Sig Dispense Refill   acetaminophen (TYLENOL) 500 MG tablet Take by mouth.     amLODipine (NORVASC) 10 MG tablet Take 10 mg by mouth every evening.      amoxicillin (AMOXIL) 500 MG capsule Take 2,000 mg by mouth See admin instructions. Take 4 capsules (2000 mg) by mouth 1 hour prior to dental work     ezetimibe-simvastatin (VYTORIN) 10-20 MG per tablet Take 1 tablet by mouth at bedtime.     glucose blood (ONETOUCH VERIO) test strip Check sugars up to 3 times a day Dx: Diabetes on insulin tx with labile sugars due to ESRD/dialysis E11.319     insulin aspart (FIASP FLEXTOUCH) 100 UNIT/ML FlexTouch Pen Inject into the skin. Inject 7 units in skin in the morning and at lunchtime. Inject 5 units in the evening with meals     labetalol (NORMODYNE) 200 MG tablet Take 1 tablet (200 mg total) by mouth 2 (two) times daily. (Patient taking differently: Take 300 mg by mouth 2 (two) times daily.) 60 tablet 0   magnesium oxide (MAG-OX) 400 (240 Mg) MG tablet Take 800 mg by mouth 2 (two) times daily.     mycophenolate (MYFORTIC) 180 MG EC tablet Take 180 mg by mouth 2 (two) times daily. (Patient  not taking: Reported on 07/31/2022)     pantoprazole (PROTONIX) 40 MG tablet Take 40 mg by mouth daily.     predniSONE (DELTASONE) 5 MG tablet Take 5 mg by mouth daily.     sulfamethoxazole-trimethoprim (BACTRIM) 400-80 MG tablet Take 1 tablet by mouth 3 (three) times a week.     tacrolimus (PROGRAF) 1 MG capsule 5 capsules in the morning and 4 capsules in the evening     TOUJEO MAX SOLOSTAR 300 UNIT/ML SOPN Inject 16 Units into the skin every evening.     Current Facility-Administered Medications  Medication Dose Route Frequency Provider Last Rate Last Admin   0.9 %  sodium chloride infusion  500 mL Intravenous Once Meryl Dare,  MD        Allergies as of 10/15/2022 - Review Complete 10/15/2022  Allergen Reaction Noted   Sucroferric oxyhydroxide  09/28/2019   Other Hives 09/28/2019   Tape Hives 03/05/2020   Phoslo  [calcium acetate] Rash 09/28/2019    Family History  Problem Relation Age of Onset   Heart disease Mother    Cancer - Other Father        gum to throat   Colon cancer Sister    Colon cancer Brother    Esophageal cancer Neg Hx    Stomach cancer Neg Hx    Rectal cancer Neg Hx     Social History   Socioeconomic History   Marital status: Married    Spouse name: Not on file   Number of children: 3   Years of education: Not on file   Highest education level: Not on file  Occupational History   Occupation: retired  Tobacco Use   Smoking status: Former    Packs/day: 1.00    Years: 20.00    Additional pack years: 0.00    Total pack years: 20.00    Types: Cigarettes    Quit date: 07/09/1997    Years since quitting: 25.2   Smokeless tobacco: Never  Vaping Use   Vaping Use: Never used  Substance and Sexual Activity   Alcohol use: Yes    Comment: rarely   Drug use: No   Sexual activity: Yes    Birth control/protection: Post-menopausal  Other Topics Concern   Not on file  Social History Narrative   Not on file   Social Determinants of Health    Financial Resource Strain: Not on file  Food Insecurity: Not on file  Transportation Needs: Not on file  Physical Activity: Not on file  Stress: Not on file  Social Connections: Not on file  Intimate Partner Violence: Not on file    Review of Systems:  All systems reviewed were negative except where noted in HPI.   Physical Exam: General:  Alert, well-developed, in NAD Head:  Normocephalic and atraumatic. Eyes:  Sclera clear, no icterus.   Conjunctiva pink. Ears:  Normal auditory acuity. Mouth:  No deformity or lesions.  Neck:  Supple; no masses. Lungs:  Clear throughout to auscultation.   No wheezes, crackles, or rhonchi.  Heart:  Regular rate and rhythm; no murmurs. Abdomen:  Soft, nondistended, nontender. No masses, hepatomegaly. No palpable masses.  Normal bowel sounds.    Rectal:  Deferred   Msk:  Symmetrical without gross deformities. Extremities:  Without edema. Neurologic:  Alert and  oriented x 4; grossly normal neurologically. Skin:  Intact without significant lesions or rashes. Psych:  Alert and cooperative. Normal mood and affect.  Venita Lick. Russella Dar  10/15/2022, 11:06 AM See Loretha Stapler, [facility], to contact our on call provider

## 2022-10-15 NOTE — Op Note (Addendum)
[facility] Patient Name: Kimberly [name] Procedure Date: 10/15/2022 11:16 AM MRN: [id number] Endoscopist: Meryl Dare , MD, [phone] Age: 67 Referring MD:  Date of Birth: 30-Jun-1955 Gender: Female Account #: [id number] Procedure:                Colonoscopy Indications:              Surveillance: Personal history of adenomatous                            polyps on last colonoscopy > 5 years ago Medicines:                Monitored Anesthesia Care Procedure:                Pre-Anesthesia Assessment:                           - Prior to the procedure, a History and Physical                            was performed, and patient medications and                            allergies were reviewed. The patient's tolerance of                            previous anesthesia was also reviewed. The risks                            and benefits of the procedure and the sedation                            options and risks were discussed with the patient.                            All questions were answered, and informed consent                            was obtained. Prior Anticoagulants: The patient has                            taken no anticoagulant or antiplatelet agents. ASA                            Grade Assessment: II - A patient with mild systemic                            disease. After reviewing the risks and benefits,                            the patient was deemed in satisfactory condition to                            undergo the procedure.  After obtaining informed consent, the colonoscope                            was passed under direct vision. Throughout the                            procedure, the patient's blood pressure, pulse, and                            oxygen saturations were monitored continuously. The                            Olympus [id number]-[id number] (#[id number]) Colonoscope was                            introduced through the  anus and advanced to the the                            cecum, identified by appendiceal orifice and                            ileocecal valve. The ileocecal valve, appendiceal                            orifice, and rectum were photographed. The quality                            of the bowel preparation was adequate after                            extensive lavage, suction. The colonoscopy was                            performed without difficulty. The patient tolerated                            the procedure well. Scope In: 11:21:41 AM Scope Out: 11:46:07 AM Scope Withdrawal Time: 0 hours 18 minutes 30 seconds  Total Procedure Duration: 0 hours 24 minutes 26 seconds  Findings:                 The perianal and digital rectal examinations showed                            external tags, otherwise normal.                           A 12 mm polyp was found in the cecum. The polyp was                            sessile. The polyp was removed with a piecemeal                            technique using a cold snare. Resection and  retrieval were complete.                           A 4 mm polyp was found in the ascending colon. The                            polyp was sessile. Biopsies were taken with a cold                            forceps for histology.                           Six sessile polyps were found in the sigmoid colon,                            transverse colon, ascending colon and cecum. The                            polyps were 6 to 9 mm in size. These polyps were                            removed with a cold snare. Resection and retrieval                            were complete.                           Multiple small-mouthed diverticula were found in                            the left colon. There was no evidence of                            diverticular bleeding.                           The exam was otherwise without abnormality on                             direct and retroflexion views. Complications:            No immediate complications. Estimated blood loss:                            None. Estimated Blood Loss:     Estimated blood loss: none. Impression:               - One 12 mm polyp in the cecum, removed piecemeal                            using a cold snare. Resected and retrieved.                           - One 4 mm polyp in the ascending colon. Biopsied.                           -  Six 6 to 9 mm polyps in the sigmoid colon, in the                            transverse colon, in the ascending colon and in the                            cecum, removed with a cold snare. Resected and                            retrieved.                           - Mild diverticulosis in the left colon.                           - The examination was otherwise normal on direct                            and retroflexion views. Recommendation:           - Repeat colonoscopy, likely 3 years, after studies                            are complete for surveillance based on pathology                            results with a more extensive bowel prep.                           - Patient has a contact number available for                            emergencies. The signs and symptoms of potential                            delayed complications were discussed with the                            patient. Return to normal activities tomorrow.                            Written discharge instructions were provided to the                            patient.                           - Resume previous diet.                           - Continue present medications.                           - Await pathology results.                           -  No aspirin, ibuprofen, naproxen, or other                            non-steroidal anti-inflammatory drugs for 2 weeks                            after polyp removal. Meryl Dare,  MD 10/15/2022 11:53:35 AM This report has been signed electronically.

## 2022-10-15 NOTE — Patient Instructions (Signed)
YOU HAD AN ENDOSCOPIC PROCEDURE TODAY AT THE  ENDOSCOPY CENTER:   Refer to the procedure report that was given to you for any specific questions about what was found during the examination.  If the procedure report does not answer your questions, please call your gastroenterologist to clarify.  If you requested that your care partner not be given the details of your procedure findings, then the procedure report has been included in a sealed envelope for you to review at your convenience later.  YOU SHOULD EXPECT: Some feelings of bloating in the abdomen. Passage of more gas than usual.  Walking can help get rid of the air that was put into your GI tract during the procedure and reduce the bloating. If you had a lower endoscopy (such as a colonoscopy or flexible sigmoidoscopy) you may notice spotting of blood in your stool or on the toilet paper. If you underwent a bowel prep for your procedure, you may not have a normal bowel movement for a few days.  Please Note:  You might notice some irritation and congestion in your nose or some drainage.  This is from the oxygen used during your procedure.  There is no need for concern and it should clear up in a day or so.  SYMPTOMS TO REPORT IMMEDIATELY:  Following lower endoscopy (colonoscopy or flexible sigmoidoscopy):  Excessive amounts of blood in the stool  Significant tenderness or worsening of abdominal pains  Swelling of the abdomen that is new, acute  Fever of 100F or higher   For urgent or emergent issues, a gastroenterologist can be reached at any hour by calling [phone]. Do not use MyChart messaging for urgent concerns.    DIET:  We do recommend a small meal at first, but then you may proceed to your regular diet.  Drink plenty of fluids but you should avoid alcoholic beverages for 24 hours.  MEDICATIONS: Continue present medications. No Aspirin, Ibuprofen, Naproxen, or other non-steroidal anti-inflammatory drugs for 2 weeks  after polyp removal.  Please see handouts given to you by your recovery nurse: Polyps, Diverticulosis.  FOLLOW UP: Repeat colonoscopy, likely 3 years, after studies are complete for surveillance based on pathology results with a more extensive bowel prep.  Thank you for allowing [place] to provide for your healthcare needs today.  ACTIVITY:  You should plan to take it easy for the rest of today and you should NOT DRIVE or use heavy machinery until tomorrow (because of the sedation medicines used during the test).    FOLLOW UP: Our staff will call the number listed on your records the next business day following your procedure.  We will call around 7:15- 8:00 am to check on you and address any questions or concerns that you may have regarding the information given to you following your procedure. If we do not reach you, we will leave a message.     If any biopsies were taken you will be contacted by phone or by letter within the next 1-3 weeks.  Please call us at [phone] if you have not heard about the biopsies in 3 weeks.    SIGNATURES/CONFIDENTIALITY: You and/or your care partner have signed paperwork which will be entered into your electronic medical record.  These signatures attest to the fact that that the information above on your After Visit Summary has been reviewed and is understood.  Full responsibility of the confidentiality of this discharge information lies with you and/or your care-partner.

## 2022-10-15 NOTE — Progress Notes (Signed)
Sedate, gd SR, tolerated procedure well, VSS, report to RN 

## 2022-10-15 NOTE — Progress Notes (Signed)
Called to room to assist during endoscopic procedure.  Patient ID and intended procedure confirmed with present staff. Received instructions for my participation in the procedure from the performing physician.  

## 2022-10-15 NOTE — Progress Notes (Signed)
Pt's states no medical or surgical changes since previsit or office visit. 

## 2022-10-16 ENCOUNTER — Telehealth: Payer: Self-pay | Admitting: *Deleted

## 2022-10-16 NOTE — Telephone Encounter (Signed)
  Follow up Call-     10/15/2022   10:51 AM  Call back number  Post procedure Call Back phone  # [phone]  Permission to leave phone message Yes     Patient questions:   Message left to call us if necessary.

## 2022-10-20 DIAGNOSIS — Z940 Kidney transplant status: Secondary | ICD-10-CM | POA: Diagnosis not present

## 2022-10-27 ENCOUNTER — Encounter: Payer: Self-pay | Admitting: Gastroenterology

## 2022-10-27 DIAGNOSIS — M76822 Posterior tibial tendinitis, left leg: Secondary | ICD-10-CM | POA: Diagnosis not present

## 2022-11-03 DIAGNOSIS — Z940 Kidney transplant status: Secondary | ICD-10-CM | POA: Diagnosis not present

## 2022-11-18 DIAGNOSIS — Z940 Kidney transplant status: Secondary | ICD-10-CM | POA: Diagnosis not present

## 2022-11-24 DIAGNOSIS — E785 Hyperlipidemia, unspecified: Secondary | ICD-10-CM | POA: Diagnosis not present

## 2022-11-24 DIAGNOSIS — E559 Vitamin D deficiency, unspecified: Secondary | ICD-10-CM | POA: Diagnosis not present

## 2022-11-24 DIAGNOSIS — D649 Anemia, unspecified: Secondary | ICD-10-CM | POA: Diagnosis not present

## 2022-11-24 DIAGNOSIS — R7989 Other specified abnormal findings of blood chemistry: Secondary | ICD-10-CM | POA: Diagnosis not present

## 2022-11-24 DIAGNOSIS — E0590 Thyrotoxicosis, unspecified without thyrotoxic crisis or storm: Secondary | ICD-10-CM | POA: Diagnosis not present

## 2022-11-24 DIAGNOSIS — D638 Anemia in other chronic diseases classified elsewhere: Secondary | ICD-10-CM | POA: Diagnosis not present

## 2022-11-24 DIAGNOSIS — I10 Essential (primary) hypertension: Secondary | ICD-10-CM | POA: Diagnosis not present

## 2022-11-24 DIAGNOSIS — M109 Gout, unspecified: Secondary | ICD-10-CM | POA: Diagnosis not present

## 2022-11-24 DIAGNOSIS — E1129 Type 2 diabetes mellitus with other diabetic kidney complication: Secondary | ICD-10-CM | POA: Diagnosis not present

## 2022-11-26 DIAGNOSIS — E0590 Thyrotoxicosis, unspecified without thyrotoxic crisis or storm: Secondary | ICD-10-CM | POA: Diagnosis not present

## 2022-11-26 DIAGNOSIS — I10 Essential (primary) hypertension: Secondary | ICD-10-CM | POA: Diagnosis not present

## 2022-11-26 DIAGNOSIS — E1129 Type 2 diabetes mellitus with other diabetic kidney complication: Secondary | ICD-10-CM | POA: Diagnosis not present

## 2022-11-26 DIAGNOSIS — M8580 Other specified disorders of bone density and structure, unspecified site: Secondary | ICD-10-CM | POA: Diagnosis not present

## 2022-11-26 DIAGNOSIS — Z1331 Encounter for screening for depression: Secondary | ICD-10-CM | POA: Diagnosis not present

## 2022-11-26 DIAGNOSIS — R82998 Other abnormal findings in urine: Secondary | ICD-10-CM | POA: Diagnosis not present

## 2022-11-26 DIAGNOSIS — Z940 Kidney transplant status: Secondary | ICD-10-CM | POA: Diagnosis not present

## 2022-11-26 DIAGNOSIS — Z86010 Personal history of colonic polyps: Secondary | ICD-10-CM | POA: Diagnosis not present

## 2022-11-26 DIAGNOSIS — N2581 Secondary hyperparathyroidism of renal origin: Secondary | ICD-10-CM | POA: Diagnosis not present

## 2022-11-26 DIAGNOSIS — D849 Immunodeficiency, unspecified: Secondary | ICD-10-CM | POA: Diagnosis not present

## 2022-11-26 DIAGNOSIS — Z1339 Encounter for screening examination for other mental health and behavioral disorders: Secondary | ICD-10-CM | POA: Diagnosis not present

## 2022-11-26 DIAGNOSIS — Z23 Encounter for immunization: Secondary | ICD-10-CM | POA: Diagnosis not present

## 2022-11-26 DIAGNOSIS — N1831 Chronic kidney disease, stage 3a: Secondary | ICD-10-CM | POA: Diagnosis not present

## 2022-11-26 DIAGNOSIS — Z0000 Encounter for general adult medical examination without abnormal findings: Secondary | ICD-10-CM | POA: Diagnosis not present

## 2022-11-26 DIAGNOSIS — E785 Hyperlipidemia, unspecified: Secondary | ICD-10-CM | POA: Diagnosis not present

## 2022-11-26 DIAGNOSIS — I129 Hypertensive chronic kidney disease with stage 1 through stage 4 chronic kidney disease, or unspecified chronic kidney disease: Secondary | ICD-10-CM | POA: Diagnosis not present

## 2022-11-26 DIAGNOSIS — E559 Vitamin D deficiency, unspecified: Secondary | ICD-10-CM | POA: Diagnosis not present

## 2022-12-01 DIAGNOSIS — L989 Disorder of the skin and subcutaneous tissue, unspecified: Secondary | ICD-10-CM | POA: Diagnosis not present

## 2022-12-01 DIAGNOSIS — Z940 Kidney transplant status: Secondary | ICD-10-CM | POA: Diagnosis not present

## 2022-12-01 DIAGNOSIS — B348 Other viral infections of unspecified site: Secondary | ICD-10-CM | POA: Diagnosis not present

## 2022-12-01 DIAGNOSIS — D849 Immunodeficiency, unspecified: Secondary | ICD-10-CM | POA: Diagnosis not present

## 2022-12-01 DIAGNOSIS — Z4822 Encounter for aftercare following kidney transplant: Secondary | ICD-10-CM | POA: Diagnosis not present

## 2022-12-15 DIAGNOSIS — Z940 Kidney transplant status: Secondary | ICD-10-CM | POA: Diagnosis not present

## 2022-12-29 DIAGNOSIS — Z940 Kidney transplant status: Secondary | ICD-10-CM | POA: Diagnosis not present

## 2023-01-12 DIAGNOSIS — Z940 Kidney transplant status: Secondary | ICD-10-CM | POA: Diagnosis not present

## 2023-01-26 DIAGNOSIS — Z4822 Encounter for aftercare following kidney transplant: Secondary | ICD-10-CM | POA: Diagnosis not present

## 2023-01-26 DIAGNOSIS — D849 Immunodeficiency, unspecified: Secondary | ICD-10-CM | POA: Diagnosis not present

## 2023-01-26 DIAGNOSIS — Z940 Kidney transplant status: Secondary | ICD-10-CM | POA: Diagnosis not present

## 2023-01-26 DIAGNOSIS — B348 Other viral infections of unspecified site: Secondary | ICD-10-CM | POA: Diagnosis not present

## 2023-02-09 DIAGNOSIS — Z940 Kidney transplant status: Secondary | ICD-10-CM | POA: Diagnosis not present

## 2023-02-24 DIAGNOSIS — Z940 Kidney transplant status: Secondary | ICD-10-CM | POA: Diagnosis not present

## 2023-03-09 DIAGNOSIS — Z940 Kidney transplant status: Secondary | ICD-10-CM | POA: Diagnosis not present

## 2023-03-23 DIAGNOSIS — E785 Hyperlipidemia, unspecified: Secondary | ICD-10-CM | POA: Diagnosis not present

## 2023-03-23 DIAGNOSIS — Z794 Long term (current) use of insulin: Secondary | ICD-10-CM | POA: Diagnosis not present

## 2023-03-23 DIAGNOSIS — Z992 Dependence on renal dialysis: Secondary | ICD-10-CM | POA: Diagnosis not present

## 2023-03-23 DIAGNOSIS — B348 Other viral infections of unspecified site: Secondary | ICD-10-CM | POA: Diagnosis not present

## 2023-03-23 DIAGNOSIS — D849 Immunodeficiency, unspecified: Secondary | ICD-10-CM | POA: Diagnosis not present

## 2023-03-23 DIAGNOSIS — N186 End stage renal disease: Secondary | ICD-10-CM | POA: Diagnosis not present

## 2023-03-23 DIAGNOSIS — I10 Essential (primary) hypertension: Secondary | ICD-10-CM | POA: Diagnosis not present

## 2023-03-23 DIAGNOSIS — E1122 Type 2 diabetes mellitus with diabetic chronic kidney disease: Secondary | ICD-10-CM | POA: Diagnosis not present

## 2023-03-23 DIAGNOSIS — Z4822 Encounter for aftercare following kidney transplant: Secondary | ICD-10-CM | POA: Diagnosis not present

## 2023-03-23 DIAGNOSIS — Z79621 Long term (current) use of calcineurin inhibitor: Secondary | ICD-10-CM | POA: Diagnosis not present

## 2023-03-23 DIAGNOSIS — Z940 Kidney transplant status: Secondary | ICD-10-CM | POA: Diagnosis not present

## 2023-03-23 DIAGNOSIS — I151 Hypertension secondary to other renal disorders: Secondary | ICD-10-CM | POA: Diagnosis not present

## 2023-03-23 DIAGNOSIS — Z5181 Encounter for therapeutic drug level monitoring: Secondary | ICD-10-CM | POA: Diagnosis not present

## 2023-04-17 DIAGNOSIS — Z1231 Encounter for screening mammogram for malignant neoplasm of breast: Secondary | ICD-10-CM | POA: Diagnosis not present

## 2023-04-17 DIAGNOSIS — Z01419 Encounter for gynecological examination (general) (routine) without abnormal findings: Secondary | ICD-10-CM | POA: Diagnosis not present

## 2023-04-17 DIAGNOSIS — Z6825 Body mass index (BMI) 25.0-25.9, adult: Secondary | ICD-10-CM | POA: Diagnosis not present

## 2023-04-20 DIAGNOSIS — Z940 Kidney transplant status: Secondary | ICD-10-CM | POA: Diagnosis not present

## 2023-04-22 DIAGNOSIS — I129 Hypertensive chronic kidney disease with stage 1 through stage 4 chronic kidney disease, or unspecified chronic kidney disease: Secondary | ICD-10-CM | POA: Diagnosis not present

## 2023-04-22 DIAGNOSIS — Z794 Long term (current) use of insulin: Secondary | ICD-10-CM | POA: Diagnosis not present

## 2023-04-22 DIAGNOSIS — E785 Hyperlipidemia, unspecified: Secondary | ICD-10-CM | POA: Diagnosis not present

## 2023-04-22 DIAGNOSIS — N1831 Chronic kidney disease, stage 3a: Secondary | ICD-10-CM | POA: Diagnosis not present

## 2023-04-22 DIAGNOSIS — Z940 Kidney transplant status: Secondary | ICD-10-CM | POA: Diagnosis not present

## 2023-04-22 DIAGNOSIS — E1129 Type 2 diabetes mellitus with other diabetic kidney complication: Secondary | ICD-10-CM | POA: Diagnosis not present

## 2023-04-28 DIAGNOSIS — H1133 Conjunctival hemorrhage, bilateral: Secondary | ICD-10-CM | POA: Diagnosis not present

## 2023-05-18 DIAGNOSIS — Z940 Kidney transplant status: Secondary | ICD-10-CM | POA: Diagnosis not present

## 2023-05-26 DIAGNOSIS — E042 Nontoxic multinodular goiter: Secondary | ICD-10-CM | POA: Diagnosis not present

## 2023-05-26 DIAGNOSIS — E785 Hyperlipidemia, unspecified: Secondary | ICD-10-CM | POA: Diagnosis not present

## 2023-05-26 DIAGNOSIS — E039 Hypothyroidism, unspecified: Secondary | ICD-10-CM | POA: Diagnosis not present

## 2023-05-26 DIAGNOSIS — N1831 Chronic kidney disease, stage 3a: Secondary | ICD-10-CM | POA: Diagnosis not present

## 2023-05-26 DIAGNOSIS — D849 Immunodeficiency, unspecified: Secondary | ICD-10-CM | POA: Diagnosis not present

## 2023-05-26 DIAGNOSIS — Z940 Kidney transplant status: Secondary | ICD-10-CM | POA: Diagnosis not present

## 2023-05-26 DIAGNOSIS — E559 Vitamin D deficiency, unspecified: Secondary | ICD-10-CM | POA: Diagnosis not present

## 2023-05-26 DIAGNOSIS — E1129 Type 2 diabetes mellitus with other diabetic kidney complication: Secondary | ICD-10-CM | POA: Diagnosis not present

## 2023-05-26 DIAGNOSIS — M109 Gout, unspecified: Secondary | ICD-10-CM | POA: Diagnosis not present

## 2023-05-26 DIAGNOSIS — E0590 Thyrotoxicosis, unspecified without thyrotoxic crisis or storm: Secondary | ICD-10-CM | POA: Diagnosis not present

## 2023-05-26 DIAGNOSIS — D692 Other nonthrombocytopenic purpura: Secondary | ICD-10-CM | POA: Diagnosis not present

## 2023-05-26 DIAGNOSIS — I129 Hypertensive chronic kidney disease with stage 1 through stage 4 chronic kidney disease, or unspecified chronic kidney disease: Secondary | ICD-10-CM | POA: Diagnosis not present

## 2023-05-26 DIAGNOSIS — M8580 Other specified disorders of bone density and structure, unspecified site: Secondary | ICD-10-CM | POA: Diagnosis not present

## 2023-06-04 DIAGNOSIS — Z5181 Encounter for therapeutic drug level monitoring: Secondary | ICD-10-CM | POA: Diagnosis not present

## 2023-06-04 DIAGNOSIS — Z48298 Encounter for aftercare following other organ transplant: Secondary | ICD-10-CM | POA: Diagnosis not present

## 2023-06-04 DIAGNOSIS — I10 Essential (primary) hypertension: Secondary | ICD-10-CM | POA: Diagnosis not present

## 2023-06-04 DIAGNOSIS — Z940 Kidney transplant status: Secondary | ICD-10-CM | POA: Diagnosis not present

## 2023-06-04 DIAGNOSIS — Z794 Long term (current) use of insulin: Secondary | ICD-10-CM | POA: Diagnosis not present

## 2023-06-04 DIAGNOSIS — E1122 Type 2 diabetes mellitus with diabetic chronic kidney disease: Secondary | ICD-10-CM | POA: Diagnosis not present

## 2023-06-04 DIAGNOSIS — Z4822 Encounter for aftercare following kidney transplant: Secondary | ICD-10-CM | POA: Diagnosis not present

## 2023-06-04 DIAGNOSIS — Z79899 Other long term (current) drug therapy: Secondary | ICD-10-CM | POA: Diagnosis not present

## 2023-06-04 DIAGNOSIS — N1832 Chronic kidney disease, stage 3b: Secondary | ICD-10-CM | POA: Diagnosis not present

## 2023-06-04 DIAGNOSIS — B348 Other viral infections of unspecified site: Secondary | ICD-10-CM | POA: Diagnosis not present

## 2023-06-04 DIAGNOSIS — I129 Hypertensive chronic kidney disease with stage 1 through stage 4 chronic kidney disease, or unspecified chronic kidney disease: Secondary | ICD-10-CM | POA: Diagnosis not present

## 2023-06-04 DIAGNOSIS — Z79621 Long term (current) use of calcineurin inhibitor: Secondary | ICD-10-CM | POA: Diagnosis not present

## 2023-06-05 DIAGNOSIS — Z940 Kidney transplant status: Secondary | ICD-10-CM | POA: Diagnosis not present

## 2023-06-23 DIAGNOSIS — Z940 Kidney transplant status: Secondary | ICD-10-CM | POA: Diagnosis not present

## 2023-07-08 DIAGNOSIS — Z940 Kidney transplant status: Secondary | ICD-10-CM | POA: Diagnosis not present

## 2023-08-04 DIAGNOSIS — E1122 Type 2 diabetes mellitus with diabetic chronic kidney disease: Secondary | ICD-10-CM | POA: Diagnosis not present

## 2023-08-04 DIAGNOSIS — N186 End stage renal disease: Secondary | ICD-10-CM | POA: Diagnosis not present

## 2023-08-04 DIAGNOSIS — Z992 Dependence on renal dialysis: Secondary | ICD-10-CM | POA: Diagnosis not present

## 2023-08-04 DIAGNOSIS — Z794 Long term (current) use of insulin: Secondary | ICD-10-CM | POA: Diagnosis not present

## 2023-08-04 DIAGNOSIS — Z940 Kidney transplant status: Secondary | ICD-10-CM | POA: Diagnosis not present

## 2023-08-05 DIAGNOSIS — I10 Essential (primary) hypertension: Secondary | ICD-10-CM | POA: Diagnosis not present

## 2023-08-05 DIAGNOSIS — Z992 Dependence on renal dialysis: Secondary | ICD-10-CM | POA: Diagnosis not present

## 2023-08-05 DIAGNOSIS — E1122 Type 2 diabetes mellitus with diabetic chronic kidney disease: Secondary | ICD-10-CM | POA: Diagnosis not present

## 2023-08-05 DIAGNOSIS — Z5181 Encounter for therapeutic drug level monitoring: Secondary | ICD-10-CM | POA: Diagnosis not present

## 2023-08-05 DIAGNOSIS — Z794 Long term (current) use of insulin: Secondary | ICD-10-CM | POA: Diagnosis not present

## 2023-08-05 DIAGNOSIS — E876 Hypokalemia: Secondary | ICD-10-CM | POA: Diagnosis not present

## 2023-08-05 DIAGNOSIS — N186 End stage renal disease: Secondary | ICD-10-CM | POA: Diagnosis not present

## 2023-08-05 DIAGNOSIS — Z4822 Encounter for aftercare following kidney transplant: Secondary | ICD-10-CM | POA: Diagnosis not present

## 2023-08-05 DIAGNOSIS — Z940 Kidney transplant status: Secondary | ICD-10-CM | POA: Diagnosis not present

## 2023-08-05 DIAGNOSIS — Z79899 Other long term (current) drug therapy: Secondary | ICD-10-CM | POA: Diagnosis not present

## 2023-08-05 DIAGNOSIS — E785 Hyperlipidemia, unspecified: Secondary | ICD-10-CM | POA: Diagnosis not present

## 2023-08-05 DIAGNOSIS — I120 Hypertensive chronic kidney disease with stage 5 chronic kidney disease or end stage renal disease: Secondary | ICD-10-CM | POA: Diagnosis not present

## 2023-08-05 DIAGNOSIS — Z79621 Long term (current) use of calcineurin inhibitor: Secondary | ICD-10-CM | POA: Diagnosis not present

## 2023-08-05 DIAGNOSIS — B348 Other viral infections of unspecified site: Secondary | ICD-10-CM | POA: Diagnosis not present

## 2023-08-05 DIAGNOSIS — D849 Immunodeficiency, unspecified: Secondary | ICD-10-CM | POA: Diagnosis not present

## 2023-08-24 DIAGNOSIS — Z940 Kidney transplant status: Secondary | ICD-10-CM | POA: Diagnosis not present

## 2023-08-25 DIAGNOSIS — Z794 Long term (current) use of insulin: Secondary | ICD-10-CM | POA: Diagnosis not present

## 2023-08-25 DIAGNOSIS — E1129 Type 2 diabetes mellitus with other diabetic kidney complication: Secondary | ICD-10-CM | POA: Diagnosis not present

## 2023-08-25 DIAGNOSIS — N1831 Chronic kidney disease, stage 3a: Secondary | ICD-10-CM | POA: Diagnosis not present

## 2023-08-25 DIAGNOSIS — I129 Hypertensive chronic kidney disease with stage 1 through stage 4 chronic kidney disease, or unspecified chronic kidney disease: Secondary | ICD-10-CM | POA: Diagnosis not present

## 2023-08-25 DIAGNOSIS — E785 Hyperlipidemia, unspecified: Secondary | ICD-10-CM | POA: Diagnosis not present

## 2023-08-25 DIAGNOSIS — Z940 Kidney transplant status: Secondary | ICD-10-CM | POA: Diagnosis not present

## 2023-09-21 DIAGNOSIS — Z940 Kidney transplant status: Secondary | ICD-10-CM | POA: Diagnosis not present

## 2023-10-13 DIAGNOSIS — E119 Type 2 diabetes mellitus without complications: Secondary | ICD-10-CM | POA: Diagnosis not present

## 2023-10-13 DIAGNOSIS — H25013 Cortical age-related cataract, bilateral: Secondary | ICD-10-CM | POA: Diagnosis not present

## 2023-10-13 DIAGNOSIS — H02135 Senile ectropion of left lower eyelid: Secondary | ICD-10-CM | POA: Diagnosis not present

## 2023-10-13 DIAGNOSIS — H2513 Age-related nuclear cataract, bilateral: Secondary | ICD-10-CM | POA: Diagnosis not present

## 2023-10-19 DIAGNOSIS — Z940 Kidney transplant status: Secondary | ICD-10-CM | POA: Diagnosis not present

## 2023-10-20 ENCOUNTER — Encounter (HOSPITAL_COMMUNITY): Payer: Self-pay

## 2023-10-20 ENCOUNTER — Other Ambulatory Visit: Payer: Self-pay

## 2023-10-20 ENCOUNTER — Emergency Department (HOSPITAL_BASED_OUTPATIENT_CLINIC_OR_DEPARTMENT_OTHER): Admitting: Radiology

## 2023-10-20 ENCOUNTER — Emergency Department (HOSPITAL_BASED_OUTPATIENT_CLINIC_OR_DEPARTMENT_OTHER)
Admission: EM | Admit: 2023-10-20 | Discharge: 2023-10-20 | Disposition: A | Discharge status: Home / Self Care | Attending: Emergency Medicine | Admitting: Emergency Medicine

## 2023-10-20 DIAGNOSIS — M7989 Other specified soft tissue disorders: Secondary | ICD-10-CM | POA: Insufficient documentation

## 2023-10-20 DIAGNOSIS — S63502A Unspecified sprain of left wrist, initial encounter: Secondary | ICD-10-CM | POA: Insufficient documentation

## 2023-10-20 DIAGNOSIS — M79642 Pain in left hand: Secondary | ICD-10-CM | POA: Diagnosis not present

## 2023-10-20 DIAGNOSIS — W19XXXA Unspecified fall, initial encounter: Secondary | ICD-10-CM | POA: Diagnosis not present

## 2023-10-20 DIAGNOSIS — Y9301 Activity, walking, marching and hiking: Secondary | ICD-10-CM | POA: Insufficient documentation

## 2023-10-20 DIAGNOSIS — S6992XA Unspecified injury of left wrist, hand and finger(s), initial encounter: Secondary | ICD-10-CM | POA: Diagnosis present

## 2023-10-20 DIAGNOSIS — M25532 Pain in left wrist: Secondary | ICD-10-CM | POA: Diagnosis not present

## 2023-10-20 DIAGNOSIS — M79602 Pain in left arm: Secondary | ICD-10-CM | POA: Diagnosis not present

## 2023-10-20 DIAGNOSIS — Z794 Long term (current) use of insulin: Secondary | ICD-10-CM | POA: Insufficient documentation

## 2023-10-20 DIAGNOSIS — M1812 Unilateral primary osteoarthritis of first carpometacarpal joint, left hand: Secondary | ICD-10-CM | POA: Diagnosis not present

## 2023-10-20 NOTE — ED Provider Notes (Signed)
 [place] EMERGENCY DEPARTMENT AT [facility] Provider Note   CSN: [id number] Arrival date & time: 10/20/23  4098     History  Chief Complaint  Patient presents with   Arm Injury    Kimberly [name] is a 68 y.o. female.  Patient with complicated medical history presents after mechanical fall yesterday while walking down step. She feels she lost her footing and ran into the wall, stopping her fall with flexed outstretched left wrist. She hit her face but denies significant pain, LOC at the time, neck pain or headache. She is not anticoagulated. No other injury.  The history is provided by the patient. No language interpreter was used.  Arm Injury      Home Medications Prior to Admission medications   Medication Sig Start Date End Date Taking? Authorizing Provider  acetaminophen  (TYLENOL ) 500 MG tablet Take by mouth.    [provider]  amLODipine  (NORVASC ) 10 MG tablet Take 10 mg by mouth every evening.     [provider]  amoxicillin  (AMOXIL ) 500 MG capsule Take 2,000 mg by mouth See admin instructions. Take 4 capsules (2000 mg) by mouth 1 hour prior to dental work 04/26/19   [provider]  azaTHIOprine  (IMURAN ) 50 MG tablet Take 1 tablet by mouth daily. 10/06/22   [provider]  ezetimibe -simvastatin  (VYTORIN ) 10-20 MG per tablet Take 1 tablet by mouth at bedtime.    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 Inject 7 units in skin in the morning and at lunchtime. Inject 5 units in the evening with meals    [provider]  labetalol  (NORMODYNE ) 200 MG tablet Take 1 tablet (200 mg total) by mouth 2 (two) times daily. Patient taking differently: Take 300 mg by mouth 2 (two)  times daily. 06/25/16   Daren Eck, DO  magnesium  oxide (MAG-OX) 400 MG tablet Take 2 tablets by mouth 2 (two) times daily.    [provider]  pantoprazole  (PROTONIX ) 40 MG tablet Take 40 mg by mouth daily.    [provider]  polyethylene glycol (MIRALAX / GLYCOLAX) 17 g packet Take 17 g by mouth daily as needed. 12/03/20   [provider]  predniSONE  (DELTASONE ) 5 MG tablet Take 5 mg by mouth daily.    [provider]  senna-docusate (SENOKOT-S) 8.6-50 MG tablet Take 1 tablet by mouth at bedtime as needed. 12/03/20   [provider]  sulfamethoxazole-trimethoprim (BACTRIM) 400-80 MG tablet Take 1 tablet by mouth 3 (three) times a week.    [provider]  tacrolimus  (PROGRAF ) 1 MG capsule 5 capsules in the morning and 4 capsules in the evening    [provider]  TOUJEO  MAX SOLOSTAR 300 UNIT/ML SOPN Inject 16 Units into the skin every evening. 03/09/19   [provider]      Allergies    Sucroferric oxyhydroxide, Other, Tape, and Phoslo  [calcium  acetate]    Review of Systems   Review of Systems  Physical Exam Updated Vital Signs BP (!) 150/73 (BP Location: Right Arm)   Pulse 61   Temp 98 F (36.7 C) (Oral)   Resp 20   Wt 68 kg   SpO2 93%   BMI 24.21 kg/m  Physical Exam Vitals  and nursing note reviewed.  Constitutional:      General: She is not in acute distress.    Appearance: She is well-developed. She is not ill-appearing.  Pulmonary:     Effort: Pulmonary effort is normal.  Musculoskeletal:        General: Normal range of motion.     Cervical back: Normal range of motion.     Comments: Let wrist is swollen dorsally with bruising across distal dorsal hand and along ulnar wrist to distal forearm. No definite bony deformity. Moves all fingers. No pain with thumb axial loading. Proximal left forearm and UE nontender. Neurovascularly intact.   Skin:    General: Skin is warm and dry.     Findings:  Bruising present.  Neurological:     Mental Status: She is alert and oriented to person, place, and time.     ED Results / Procedures / Treatments   Labs (all labs ordered are listed, but only abnormal results are displayed) Labs Reviewed - No data to display  EKG None  Radiology DG Hand Complete Left Result Date: 10/20/2023 CLINICAL DATA:  Fall.  Hand pain. EXAM: LEFT HAND - COMPLETE 3+ VIEW COMPARISON:  No comparison studies available. FINDINGS: No evidence for an acute fracture. No subluxation or dislocation. Bones are diffusely demineralized. Degenerative changes are seen in the first carpometacarpal joint and scattered IP joints. IMPRESSION: 1. No acute bony findings. 2. Degenerative changes in the first carpometacarpal joint and scattered IP joints. Electronically Signed   By: Donnal Fusi M.D.   On: 10/20/2023 10:50   DG Wrist Complete Left Result Date: 10/20/2023 CLINICAL DATA:  Fall with distal arm and hand pain. EXAM: LEFT WRIST - COMPLETE 3+ VIEW COMPARISON:  No comparison studies available. FINDINGS: Bones are diffusely demineralized. No evidence for an acute fracture. No dislocation. Degenerative changes are seen in the first carpometacarpal joint. IMPRESSION: 1. No acute bony findings. 2. Degenerative changes in the first carpometacarpal joint. Electronically Signed   By: Donnal Fusi M.D.   On: 10/20/2023 10:49    Procedures Procedures    Medications Ordered in ED Medications - No data to display  ED Course/ Medical Decision Making/ A&P Clinical Course as of 10/20/23 1116  Tue Oct 20, 2023  1113 Patient with isolated left wrist injury after slipping, falling into a wall yesterday, injuring left wrist. Xrays negative for fracture. Not anticoagulated. Velcro splint provided. She reports she is content with Tylenol  for pain only.  [SU]    Clinical Course User Index [SU] Mandy Second, PA-C                                 Medical Decision Making Amount and/or  Complexity of Data Reviewed Radiology: ordered.           Final Clinical Impression(s) / ED Diagnoses Final diagnoses:  Wrist sprain, left, initial encounter    Rx / DC Orders ED Discharge Orders     None         Mandy Second, PA-C 10/20/23 1116    Nicklas Barns, MD 10/20/23 1512

## 2023-10-20 NOTE — ED Triage Notes (Signed)
 Pt c/o LT distal, lateral arm and hand pain after mechanical fall into wall yesterday. Tylenol  1g at 0500. Cap refill WNL, denies thinners

## 2023-10-20 NOTE — ED Notes (Signed)
 Discharge paperwork given and verbally understood.

## 2023-10-20 NOTE — Discharge Instructions (Signed)
 Take Tylenol  for pain. Cool compresses and elevation of the wrist to reduce swelling. Wear the splint until comfortable without the support.   Return to the ED as needed.

## 2023-11-03 DIAGNOSIS — Z4822 Encounter for aftercare following kidney transplant: Secondary | ICD-10-CM | POA: Diagnosis not present

## 2023-11-03 DIAGNOSIS — Z5181 Encounter for therapeutic drug level monitoring: Secondary | ICD-10-CM | POA: Diagnosis not present

## 2023-11-03 DIAGNOSIS — Z79899 Other long term (current) drug therapy: Secondary | ICD-10-CM | POA: Diagnosis not present

## 2023-11-03 DIAGNOSIS — I10 Essential (primary) hypertension: Secondary | ICD-10-CM | POA: Diagnosis not present

## 2023-11-03 DIAGNOSIS — E785 Hyperlipidemia, unspecified: Secondary | ICD-10-CM | POA: Diagnosis not present

## 2023-11-03 DIAGNOSIS — B348 Other viral infections of unspecified site: Secondary | ICD-10-CM | POA: Diagnosis not present

## 2023-11-03 DIAGNOSIS — E876 Hypokalemia: Secondary | ICD-10-CM | POA: Diagnosis not present

## 2023-11-03 DIAGNOSIS — N186 End stage renal disease: Secondary | ICD-10-CM | POA: Diagnosis not present

## 2023-11-03 DIAGNOSIS — Z794 Long term (current) use of insulin: Secondary | ICD-10-CM | POA: Diagnosis not present

## 2023-11-03 DIAGNOSIS — Z48298 Encounter for aftercare following other organ transplant: Secondary | ICD-10-CM | POA: Diagnosis not present

## 2023-11-03 DIAGNOSIS — E1122 Type 2 diabetes mellitus with diabetic chronic kidney disease: Secondary | ICD-10-CM | POA: Diagnosis not present

## 2023-11-03 DIAGNOSIS — D849 Immunodeficiency, unspecified: Secondary | ICD-10-CM | POA: Diagnosis not present

## 2023-11-03 DIAGNOSIS — Z79621 Long term (current) use of calcineurin inhibitor: Secondary | ICD-10-CM | POA: Diagnosis not present

## 2023-11-03 DIAGNOSIS — I120 Hypertensive chronic kidney disease with stage 5 chronic kidney disease or end stage renal disease: Secondary | ICD-10-CM | POA: Diagnosis not present

## 2023-11-03 DIAGNOSIS — Z940 Kidney transplant status: Secondary | ICD-10-CM | POA: Diagnosis not present

## 2023-11-03 DIAGNOSIS — Z992 Dependence on renal dialysis: Secondary | ICD-10-CM | POA: Diagnosis not present

## 2023-11-17 DIAGNOSIS — Z940 Kidney transplant status: Secondary | ICD-10-CM | POA: Diagnosis not present

## 2023-11-25 DIAGNOSIS — Z940 Kidney transplant status: Secondary | ICD-10-CM | POA: Diagnosis not present

## 2023-11-25 DIAGNOSIS — Z794 Long term (current) use of insulin: Secondary | ICD-10-CM | POA: Diagnosis not present

## 2023-11-25 DIAGNOSIS — E1129 Type 2 diabetes mellitus with other diabetic kidney complication: Secondary | ICD-10-CM | POA: Diagnosis not present

## 2023-11-25 DIAGNOSIS — I129 Hypertensive chronic kidney disease with stage 1 through stage 4 chronic kidney disease, or unspecified chronic kidney disease: Secondary | ICD-10-CM | POA: Diagnosis not present

## 2023-11-25 DIAGNOSIS — E785 Hyperlipidemia, unspecified: Secondary | ICD-10-CM | POA: Diagnosis not present

## 2023-11-25 DIAGNOSIS — N1831 Chronic kidney disease, stage 3a: Secondary | ICD-10-CM | POA: Diagnosis not present

## 2023-12-15 DIAGNOSIS — Z940 Kidney transplant status: Secondary | ICD-10-CM | POA: Diagnosis not present

## 2023-12-28 DIAGNOSIS — Z79899 Other long term (current) drug therapy: Secondary | ICD-10-CM | POA: Diagnosis not present

## 2023-12-28 DIAGNOSIS — Z940 Kidney transplant status: Secondary | ICD-10-CM | POA: Diagnosis not present

## 2023-12-30 DIAGNOSIS — D638 Anemia in other chronic diseases classified elsewhere: Secondary | ICD-10-CM | POA: Diagnosis not present

## 2023-12-30 DIAGNOSIS — I129 Hypertensive chronic kidney disease with stage 1 through stage 4 chronic kidney disease, or unspecified chronic kidney disease: Secondary | ICD-10-CM | POA: Diagnosis not present

## 2023-12-30 DIAGNOSIS — M109 Gout, unspecified: Secondary | ICD-10-CM | POA: Diagnosis not present

## 2023-12-30 DIAGNOSIS — N2581 Secondary hyperparathyroidism of renal origin: Secondary | ICD-10-CM | POA: Diagnosis not present

## 2023-12-30 DIAGNOSIS — E785 Hyperlipidemia, unspecified: Secondary | ICD-10-CM | POA: Diagnosis not present

## 2023-12-30 DIAGNOSIS — E0590 Thyrotoxicosis, unspecified without thyrotoxic crisis or storm: Secondary | ICD-10-CM | POA: Diagnosis not present

## 2023-12-30 DIAGNOSIS — D849 Immunodeficiency, unspecified: Secondary | ICD-10-CM | POA: Diagnosis not present

## 2023-12-30 DIAGNOSIS — E559 Vitamin D deficiency, unspecified: Secondary | ICD-10-CM | POA: Diagnosis not present

## 2023-12-30 DIAGNOSIS — E042 Nontoxic multinodular goiter: Secondary | ICD-10-CM | POA: Diagnosis not present

## 2023-12-30 DIAGNOSIS — E1129 Type 2 diabetes mellitus with other diabetic kidney complication: Secondary | ICD-10-CM | POA: Diagnosis not present

## 2023-12-30 DIAGNOSIS — N1831 Chronic kidney disease, stage 3a: Secondary | ICD-10-CM | POA: Diagnosis not present

## 2024-01-01 DIAGNOSIS — N950 Postmenopausal bleeding: Secondary | ICD-10-CM | POA: Diagnosis not present

## 2024-01-04 DIAGNOSIS — E876 Hypokalemia: Secondary | ICD-10-CM | POA: Diagnosis not present

## 2024-01-04 DIAGNOSIS — B348 Other viral infections of unspecified site: Secondary | ICD-10-CM | POA: Diagnosis not present

## 2024-01-04 DIAGNOSIS — E1122 Type 2 diabetes mellitus with diabetic chronic kidney disease: Secondary | ICD-10-CM | POA: Diagnosis not present

## 2024-01-04 DIAGNOSIS — D849 Immunodeficiency, unspecified: Secondary | ICD-10-CM | POA: Diagnosis not present

## 2024-01-04 DIAGNOSIS — I10 Essential (primary) hypertension: Secondary | ICD-10-CM | POA: Diagnosis not present

## 2024-01-04 DIAGNOSIS — N186 End stage renal disease: Secondary | ICD-10-CM | POA: Diagnosis not present

## 2024-01-04 DIAGNOSIS — Z48298 Encounter for aftercare following other organ transplant: Secondary | ICD-10-CM | POA: Diagnosis not present

## 2024-01-04 DIAGNOSIS — Z5181 Encounter for therapeutic drug level monitoring: Secondary | ICD-10-CM | POA: Diagnosis not present

## 2024-01-04 DIAGNOSIS — Z940 Kidney transplant status: Secondary | ICD-10-CM | POA: Diagnosis not present

## 2024-01-04 DIAGNOSIS — I120 Hypertensive chronic kidney disease with stage 5 chronic kidney disease or end stage renal disease: Secondary | ICD-10-CM | POA: Diagnosis not present

## 2024-01-04 DIAGNOSIS — Z992 Dependence on renal dialysis: Secondary | ICD-10-CM | POA: Diagnosis not present

## 2024-01-04 DIAGNOSIS — Z0000 Encounter for general adult medical examination without abnormal findings: Secondary | ICD-10-CM | POA: Diagnosis not present

## 2024-01-04 DIAGNOSIS — Z79899 Other long term (current) drug therapy: Secondary | ICD-10-CM | POA: Diagnosis not present

## 2024-01-04 DIAGNOSIS — Z4822 Encounter for aftercare following kidney transplant: Secondary | ICD-10-CM | POA: Diagnosis not present

## 2024-01-04 DIAGNOSIS — Z794 Long term (current) use of insulin: Secondary | ICD-10-CM | POA: Diagnosis not present

## 2024-01-04 DIAGNOSIS — E785 Hyperlipidemia, unspecified: Secondary | ICD-10-CM | POA: Diagnosis not present

## 2024-01-04 DIAGNOSIS — Z79621 Long term (current) use of calcineurin inhibitor: Secondary | ICD-10-CM | POA: Diagnosis not present

## 2024-01-06 DIAGNOSIS — E785 Hyperlipidemia, unspecified: Secondary | ICD-10-CM | POA: Diagnosis not present

## 2024-01-06 DIAGNOSIS — N2581 Secondary hyperparathyroidism of renal origin: Secondary | ICD-10-CM | POA: Diagnosis not present

## 2024-01-06 DIAGNOSIS — Z860101 Personal history of adenomatous and serrated colon polyps: Secondary | ICD-10-CM | POA: Diagnosis not present

## 2024-01-06 DIAGNOSIS — D849 Immunodeficiency, unspecified: Secondary | ICD-10-CM | POA: Diagnosis not present

## 2024-01-06 DIAGNOSIS — Z1339 Encounter for screening examination for other mental health and behavioral disorders: Secondary | ICD-10-CM | POA: Diagnosis not present

## 2024-01-06 DIAGNOSIS — E0590 Thyrotoxicosis, unspecified without thyrotoxic crisis or storm: Secondary | ICD-10-CM | POA: Diagnosis not present

## 2024-01-06 DIAGNOSIS — E1122 Type 2 diabetes mellitus with diabetic chronic kidney disease: Secondary | ICD-10-CM | POA: Diagnosis not present

## 2024-01-06 DIAGNOSIS — Z1331 Encounter for screening for depression: Secondary | ICD-10-CM | POA: Diagnosis not present

## 2024-01-06 DIAGNOSIS — D638 Anemia in other chronic diseases classified elsewhere: Secondary | ICD-10-CM | POA: Diagnosis not present

## 2024-01-06 DIAGNOSIS — Z0000 Encounter for general adult medical examination without abnormal findings: Secondary | ICD-10-CM | POA: Diagnosis not present

## 2024-01-06 DIAGNOSIS — R82998 Other abnormal findings in urine: Secondary | ICD-10-CM | POA: Diagnosis not present

## 2024-01-06 DIAGNOSIS — D692 Other nonthrombocytopenic purpura: Secondary | ICD-10-CM | POA: Diagnosis not present

## 2024-01-06 DIAGNOSIS — E042 Nontoxic multinodular goiter: Secondary | ICD-10-CM | POA: Diagnosis not present

## 2024-01-06 DIAGNOSIS — I129 Hypertensive chronic kidney disease with stage 1 through stage 4 chronic kidney disease, or unspecified chronic kidney disease: Secondary | ICD-10-CM | POA: Diagnosis not present

## 2024-01-06 DIAGNOSIS — Z940 Kidney transplant status: Secondary | ICD-10-CM | POA: Diagnosis not present

## 2024-01-06 DIAGNOSIS — N1831 Chronic kidney disease, stage 3a: Secondary | ICD-10-CM | POA: Diagnosis not present

## 2024-01-06 DIAGNOSIS — M8580 Other specified disorders of bone density and structure, unspecified site: Secondary | ICD-10-CM | POA: Diagnosis not present

## 2024-01-20 DIAGNOSIS — Z940 Kidney transplant status: Secondary | ICD-10-CM | POA: Diagnosis not present

## 2024-01-29 DIAGNOSIS — N950 Postmenopausal bleeding: Secondary | ICD-10-CM | POA: Diagnosis not present

## 2024-02-15 DIAGNOSIS — Z940 Kidney transplant status: Secondary | ICD-10-CM | POA: Diagnosis not present

## 2024-02-17 DIAGNOSIS — Z940 Kidney transplant status: Secondary | ICD-10-CM | POA: Diagnosis not present

## 2024-02-17 DIAGNOSIS — N1831 Chronic kidney disease, stage 3a: Secondary | ICD-10-CM | POA: Diagnosis not present

## 2024-02-17 DIAGNOSIS — E1129 Type 2 diabetes mellitus with other diabetic kidney complication: Secondary | ICD-10-CM | POA: Diagnosis not present

## 2024-02-17 DIAGNOSIS — M542 Cervicalgia: Secondary | ICD-10-CM | POA: Diagnosis not present

## 2024-02-17 DIAGNOSIS — I10 Essential (primary) hypertension: Secondary | ICD-10-CM | POA: Diagnosis not present

## 2024-03-08 DIAGNOSIS — Z794 Long term (current) use of insulin: Secondary | ICD-10-CM | POA: Diagnosis not present

## 2024-03-08 DIAGNOSIS — Z940 Kidney transplant status: Secondary | ICD-10-CM | POA: Diagnosis not present

## 2024-03-08 DIAGNOSIS — Z7984 Long term (current) use of oral hypoglycemic drugs: Secondary | ICD-10-CM | POA: Diagnosis not present

## 2024-03-08 DIAGNOSIS — E119 Type 2 diabetes mellitus without complications: Secondary | ICD-10-CM | POA: Diagnosis not present

## 2024-03-08 DIAGNOSIS — M810 Age-related osteoporosis without current pathological fracture: Secondary | ICD-10-CM | POA: Diagnosis not present

## 2024-03-08 DIAGNOSIS — I129 Hypertensive chronic kidney disease with stage 1 through stage 4 chronic kidney disease, or unspecified chronic kidney disease: Secondary | ICD-10-CM | POA: Diagnosis not present

## 2024-03-08 DIAGNOSIS — Z79899 Other long term (current) drug therapy: Secondary | ICD-10-CM | POA: Diagnosis not present

## 2024-03-22 DIAGNOSIS — Z940 Kidney transplant status: Secondary | ICD-10-CM | POA: Diagnosis not present

## 2024-04-06 ENCOUNTER — Encounter (HOSPITAL_COMMUNITY): Payer: Self-pay | Admitting: Obstetrics and Gynecology

## 2024-04-06 NOTE — Progress Notes (Addendum)
 Spoke w/ via phone for pre-op interview--- [facility] needs dos----   EKG, BMP, CBG and A1C per anesthesia.      Lab results------ COVID test -----patient states asymptomatic no test needed Arrive at -------0530 NPO after MN NO Solid Food.   Pre-Surgery Ensure or G2:  Med rec completed Medications to take morning of surgery ----- Imuran , Labatelol, Protonix , Prednisone  and Prograf . Diabetic medication ----- No insulin  AM of surgery. 1/2 insulin  (dose 8 units) night before.  GLP1 agonist last dose: GLP1 instructions:  Patient instructed no nail polish to be worn day of surgery Patient instructed to bring photo id and insurance card day of surgery Patient aware to have Driver (ride ) / caregiver    for 24 hours after surgery - Husband Lynwood Collum Patient Special Instructions ----- Pre-Op special Instructions ----- Left arm Libre CBG monitor. NS Iv fluid pt has CKD  Patient verbalized understanding of instructions that were given at this phone interview. Patient denies chest pain, sob, fever, cough at the interview.

## 2024-04-08 ENCOUNTER — Inpatient Hospital Stay (HOSPITAL_COMMUNITY)
Admission: EM | Admit: 2024-04-08 | Discharge: 2024-04-13 | DRG: 853 | Disposition: A | Discharge status: Home / Self Care | Source: Ambulatory Visit | Attending: Internal Medicine | Admitting: Internal Medicine

## 2024-04-08 ENCOUNTER — Other Ambulatory Visit: Payer: Self-pay

## 2024-04-08 ENCOUNTER — Emergency Department (HOSPITAL_COMMUNITY)

## 2024-04-08 ENCOUNTER — Encounter (HOSPITAL_COMMUNITY): Payer: Self-pay | Admitting: Emergency Medicine

## 2024-04-08 DIAGNOSIS — Z79624 Long term (current) use of inhibitors of nucleotide synthesis: Secondary | ICD-10-CM | POA: Diagnosis not present

## 2024-04-08 DIAGNOSIS — R188 Other ascites: Secondary | ICD-10-CM | POA: Diagnosis not present

## 2024-04-08 DIAGNOSIS — D849 Immunodeficiency, unspecified: Secondary | ICD-10-CM | POA: Diagnosis not present

## 2024-04-08 DIAGNOSIS — E872 Acidosis, unspecified: Secondary | ICD-10-CM | POA: Diagnosis present

## 2024-04-08 DIAGNOSIS — K3589 Other acute appendicitis without perforation or gangrene: Secondary | ICD-10-CM | POA: Diagnosis not present

## 2024-04-08 DIAGNOSIS — T8619 Other complication of kidney transplant: Secondary | ICD-10-CM | POA: Diagnosis present

## 2024-04-08 DIAGNOSIS — E860 Dehydration: Secondary | ICD-10-CM | POA: Diagnosis present

## 2024-04-08 DIAGNOSIS — N184 Chronic kidney disease, stage 4 (severe): Secondary | ICD-10-CM | POA: Diagnosis not present

## 2024-04-08 DIAGNOSIS — Y830 Surgical operation with transplant of whole organ as the cause of abnormal reaction of the patient, or of later complication, without mention of misadventure at the time of the procedure: Secondary | ICD-10-CM | POA: Diagnosis present

## 2024-04-08 DIAGNOSIS — N186 End stage renal disease: Secondary | ICD-10-CM | POA: Diagnosis not present

## 2024-04-08 DIAGNOSIS — I120 Hypertensive chronic kidney disease with stage 5 chronic kidney disease or end stage renal disease: Secondary | ICD-10-CM | POA: Diagnosis not present

## 2024-04-08 DIAGNOSIS — K3533 Acute appendicitis with perforation and localized peritonitis, with abscess: Principal | ICD-10-CM | POA: Diagnosis present

## 2024-04-08 DIAGNOSIS — I129 Hypertensive chronic kidney disease with stage 1 through stage 4 chronic kidney disease, or unspecified chronic kidney disease: Secondary | ICD-10-CM | POA: Diagnosis present

## 2024-04-08 DIAGNOSIS — K353 Acute appendicitis with localized peritonitis, without perforation or gangrene: Secondary | ICD-10-CM | POA: Diagnosis not present

## 2024-04-08 DIAGNOSIS — E119 Type 2 diabetes mellitus without complications: Secondary | ICD-10-CM | POA: Diagnosis not present

## 2024-04-08 DIAGNOSIS — R6520 Severe sepsis without septic shock: Secondary | ICD-10-CM | POA: Diagnosis present

## 2024-04-08 DIAGNOSIS — N1832 Chronic kidney disease, stage 3b: Secondary | ICD-10-CM | POA: Diagnosis present

## 2024-04-08 DIAGNOSIS — J189 Pneumonia, unspecified organism: Secondary | ICD-10-CM | POA: Diagnosis present

## 2024-04-08 DIAGNOSIS — E871 Hypo-osmolality and hyponatremia: Secondary | ICD-10-CM | POA: Diagnosis present

## 2024-04-08 DIAGNOSIS — N1831 Chronic kidney disease, stage 3a: Secondary | ICD-10-CM | POA: Diagnosis not present

## 2024-04-08 DIAGNOSIS — Z79621 Long term (current) use of calcineurin inhibitor: Secondary | ICD-10-CM | POA: Diagnosis not present

## 2024-04-08 DIAGNOSIS — K7689 Other specified diseases of liver: Secondary | ICD-10-CM | POA: Diagnosis not present

## 2024-04-08 DIAGNOSIS — E1122 Type 2 diabetes mellitus with diabetic chronic kidney disease: Secondary | ICD-10-CM | POA: Diagnosis present

## 2024-04-08 DIAGNOSIS — E1129 Type 2 diabetes mellitus with other diabetic kidney complication: Secondary | ICD-10-CM | POA: Diagnosis not present

## 2024-04-08 DIAGNOSIS — D84821 Immunodeficiency due to drugs: Secondary | ICD-10-CM | POA: Diagnosis present

## 2024-04-08 DIAGNOSIS — E878 Other disorders of electrolyte and fluid balance, not elsewhere classified: Secondary | ICD-10-CM | POA: Diagnosis present

## 2024-04-08 DIAGNOSIS — Z940 Kidney transplant status: Secondary | ICD-10-CM

## 2024-04-08 DIAGNOSIS — R112 Nausea with vomiting, unspecified: Secondary | ICD-10-CM | POA: Diagnosis not present

## 2024-04-08 DIAGNOSIS — E785 Hyperlipidemia, unspecified: Secondary | ICD-10-CM | POA: Diagnosis present

## 2024-04-08 DIAGNOSIS — E1165 Type 2 diabetes mellitus with hyperglycemia: Secondary | ICD-10-CM | POA: Diagnosis present

## 2024-04-08 DIAGNOSIS — R1030 Lower abdominal pain, unspecified: Secondary | ICD-10-CM | POA: Diagnosis not present

## 2024-04-08 DIAGNOSIS — A419 Sepsis, unspecified organism: Principal | ICD-10-CM | POA: Diagnosis present

## 2024-04-08 DIAGNOSIS — K750 Abscess of liver: Secondary | ICD-10-CM | POA: Diagnosis present

## 2024-04-08 DIAGNOSIS — I10 Essential (primary) hypertension: Secondary | ICD-10-CM | POA: Diagnosis not present

## 2024-04-08 DIAGNOSIS — D72825 Bandemia: Secondary | ICD-10-CM | POA: Diagnosis not present

## 2024-04-08 DIAGNOSIS — K5730 Diverticulosis of large intestine without perforation or abscess without bleeding: Secondary | ICD-10-CM | POA: Diagnosis not present

## 2024-04-08 DIAGNOSIS — K449 Diaphragmatic hernia without obstruction or gangrene: Secondary | ICD-10-CM | POA: Diagnosis not present

## 2024-04-08 DIAGNOSIS — E039 Hypothyroidism, unspecified: Secondary | ICD-10-CM | POA: Diagnosis present

## 2024-04-08 DIAGNOSIS — N179 Acute kidney failure, unspecified: Secondary | ICD-10-CM | POA: Diagnosis not present

## 2024-04-08 DIAGNOSIS — Z794 Long term (current) use of insulin: Secondary | ICD-10-CM | POA: Diagnosis not present

## 2024-04-08 DIAGNOSIS — K5750 Diverticulosis of both small and large intestine without perforation or abscess without bleeding: Secondary | ICD-10-CM | POA: Diagnosis not present

## 2024-04-08 DIAGNOSIS — K3580 Unspecified acute appendicitis: Secondary | ICD-10-CM | POA: Diagnosis present

## 2024-04-08 DIAGNOSIS — Z87891 Personal history of nicotine dependence: Secondary | ICD-10-CM

## 2024-04-08 DIAGNOSIS — K35201 Acute appendicitis with generalized peritonitis, with perforation, without abscess: Secondary | ICD-10-CM | POA: Diagnosis not present

## 2024-04-08 DIAGNOSIS — R0602 Shortness of breath: Secondary | ICD-10-CM | POA: Diagnosis not present

## 2024-04-08 DIAGNOSIS — J90 Pleural effusion, not elsewhere classified: Secondary | ICD-10-CM | POA: Diagnosis not present

## 2024-04-08 DIAGNOSIS — R918 Other nonspecific abnormal finding of lung field: Secondary | ICD-10-CM | POA: Diagnosis not present

## 2024-04-08 DIAGNOSIS — K37 Unspecified appendicitis: Secondary | ICD-10-CM | POA: Diagnosis not present

## 2024-04-08 DIAGNOSIS — R1031 Right lower quadrant pain: Secondary | ICD-10-CM | POA: Diagnosis not present

## 2024-04-08 DIAGNOSIS — N2581 Secondary hyperparathyroidism of renal origin: Secondary | ICD-10-CM | POA: Diagnosis not present

## 2024-04-08 LAB — CBC
HCT: 39.6 % (ref 36.0–46.0)
Hemoglobin: 12.8 g/dL (ref 12.0–15.0)
MCH: 30 pg (ref 26.0–34.0)
MCHC: 32.3 g/dL (ref 30.0–36.0)
MCV: 93 fL (ref 80.0–100.0)
Platelets: 135 K/uL — ABNORMAL LOW (ref 150–400)
RBC: 4.26 MIL/uL (ref 3.87–5.11)
RDW: 13.2 % (ref 11.5–15.5)
WBC: 14.5 K/uL — ABNORMAL HIGH (ref 4.0–10.5)
nRBC: 0 % (ref 0.0–0.2)

## 2024-04-08 LAB — COMPREHENSIVE METABOLIC PANEL WITH GFR
ALT: 14 U/L (ref 0–44)
AST: 19 U/L (ref 15–41)
Albumin: 3.6 g/dL (ref 3.5–5.0)
Alkaline Phosphatase: 87 U/L (ref 38–126)
Anion gap: 13 (ref 5–15)
BUN: 29 mg/dL — ABNORMAL HIGH (ref 8–23)
CO2: 21 mmol/L — ABNORMAL LOW (ref 22–32)
Calcium: 9.7 mg/dL (ref 8.9–10.3)
Chloride: 95 mmol/L — ABNORMAL LOW (ref 98–111)
Creatinine, Ser: 1.72 mg/dL — ABNORMAL HIGH (ref 0.44–1.00)
GFR, Estimated: 32 mL/min — ABNORMAL LOW (ref >60)
Glucose, Bld: 234 mg/dL — ABNORMAL HIGH (ref 70–99)
Potassium: 3.9 mmol/L (ref 3.5–5.1)
Sodium: 130 mmol/L — ABNORMAL LOW (ref 135–145)
Total Bilirubin: 1.4 mg/dL — ABNORMAL HIGH (ref 0.0–1.2)
Total Protein: 5.8 g/dL — ABNORMAL LOW (ref 6.5–8.1)

## 2024-04-08 LAB — LIPASE, BLOOD: Lipase: 15 U/L (ref 11–51)

## 2024-04-08 LAB — I-STAT CG4 LACTIC ACID, ED: Lactic Acid, Venous: 2 mmol/L (ref 0.5–1.9)

## 2024-04-08 LAB — PROCALCITONIN: Procalcitonin: 38.9 ng/mL

## 2024-04-08 LAB — PROTIME-INR
INR: 1.2 (ref 0.8–1.2)
Prothrombin Time: 15.8 s — ABNORMAL HIGH (ref 11.4–15.2)

## 2024-04-08 MED ORDER — SIMVASTATIN 20 MG PO TABS
20.0000 mg | ORAL_TABLET | Freq: Every day | ORAL | Status: DC
  Administered 2024-04-09 – 2024-04-12 (×4): 20 mg via ORAL
  Filled 2024-04-08 (×4): qty 1

## 2024-04-08 MED ORDER — SULFAMETHOXAZOLE-TRIMETHOPRIM 400-80 MG PO TABS
1.0000 | ORAL_TABLET | ORAL | Status: DC
  Administered 2024-04-11 – 2024-04-13 (×2): 1 via ORAL
  Filled 2024-04-08 (×2): qty 1

## 2024-04-08 MED ORDER — EZETIMIBE-SIMVASTATIN 10-20 MG PO TABS: 1.0000 | ORAL_TABLET | Freq: Every day | ORAL | Status: DC

## 2024-04-08 MED ORDER — INSULIN GLARGINE-YFGN 100 UNIT/ML ~~LOC~~ SOLN
16.0000 [IU] | Freq: Every evening | SUBCUTANEOUS | Status: DC
  Administered 2024-04-09 – 2024-04-11 (×4): 16 [IU] via SUBCUTANEOUS
  Filled 2024-04-08 (×6): qty 0.16

## 2024-04-08 MED ORDER — LACTATED RINGERS IV BOLUS (SEPSIS)
1000.0000 mL | Freq: Once | INTRAVENOUS | Status: AC
  Administered 2024-04-08: 1000 mL via INTRAVENOUS

## 2024-04-08 MED ORDER — METRONIDAZOLE 500 MG/100ML IV SOLN
500.0000 mg | Freq: Two times a day (BID) | INTRAVENOUS | Status: DC
  Administered 2024-04-09 – 2024-04-12 (×7): 500 mg via INTRAVENOUS
  Filled 2024-04-08 (×7): qty 100

## 2024-04-08 MED ORDER — ONDANSETRON HCL 4 MG/2ML IJ SOLN
4.0000 mg | Freq: Four times a day (QID) | INTRAMUSCULAR | Status: DC | PRN
  Administered 2024-04-11: 4 mg via INTRAVENOUS

## 2024-04-08 MED ORDER — SODIUM CHLORIDE 0.9 % IV SOLN
2.0000 g | INTRAVENOUS | Status: DC
  Administered 2024-04-09 – 2024-04-12 (×4): 2 g via INTRAVENOUS
  Filled 2024-04-08 (×4): qty 20

## 2024-04-08 MED ORDER — SODIUM CHLORIDE 0.9 % IV SOLN
1.0000 g | Freq: Once | INTRAVENOUS | Status: AC
  Administered 2024-04-08: 1 g via INTRAVENOUS
  Filled 2024-04-08: qty 10

## 2024-04-08 MED ORDER — ACETAMINOPHEN 650 MG RE SUPP: 650.0000 mg | Freq: Four times a day (QID) | RECTAL | Status: DC | PRN

## 2024-04-08 MED ORDER — ONDANSETRON HCL 4 MG PO TABS: 4.0000 mg | ORAL_TABLET | Freq: Four times a day (QID) | ORAL | Status: DC | PRN

## 2024-04-08 MED ORDER — MAGNESIUM OXIDE 400 MG PO TABS: 800.0000 mg | ORAL_TABLET | Freq: Two times a day (BID) | ORAL | Status: DC

## 2024-04-08 MED ORDER — PREDNISONE 5 MG PO TABS
5.0000 mg | ORAL_TABLET | Freq: Every day | ORAL | Status: DC
  Administered 2024-04-09 – 2024-04-13 (×4): 5 mg via ORAL
  Filled 2024-04-08 (×5): qty 1

## 2024-04-08 MED ORDER — METRONIDAZOLE 500 MG/100ML IV SOLN
500.0000 mg | Freq: Once | INTRAVENOUS | Status: AC
  Administered 2024-04-08: 500 mg via INTRAVENOUS
  Filled 2024-04-08: qty 100

## 2024-04-08 MED ORDER — SODIUM CHLORIDE 0.9 % IV SOLN: INTRAVENOUS | Status: AC

## 2024-04-08 MED ORDER — ACETAMINOPHEN 325 MG PO TABS
650.0000 mg | ORAL_TABLET | Freq: Once | ORAL | Status: AC
  Administered 2024-04-08: 650 mg via ORAL
  Filled 2024-04-08: qty 2

## 2024-04-08 MED ORDER — HYDROCODONE-ACETAMINOPHEN 5-325 MG PO TABS
1.0000 | ORAL_TABLET | ORAL | Status: DC | PRN
  Administered 2024-04-09 – 2024-04-12 (×5): 2 via ORAL
  Administered 2024-04-12 – 2024-04-13 (×2): 1 via ORAL
  Filled 2024-04-08 (×4): qty 2
  Filled 2024-04-08 (×2): qty 1
  Filled 2024-04-08 (×2): qty 2

## 2024-04-08 MED ORDER — MAGNESIUM OXIDE -MG SUPPLEMENT 400 (240 MG) MG PO TABS
400.0000 mg | ORAL_TABLET | Freq: Two times a day (BID) | ORAL | Status: DC
  Administered 2024-04-09 – 2024-04-13 (×10): 400 mg via ORAL
  Filled 2024-04-08 (×10): qty 1

## 2024-04-08 MED ORDER — TACROLIMUS 1 MG PO CAPS
4.0000 mg | ORAL_CAPSULE | Freq: Two times a day (BID) | ORAL | Status: DC
  Administered 2024-04-09 – 2024-04-13 (×10): 4 mg via ORAL
  Filled 2024-04-08 (×11): qty 4

## 2024-04-08 MED ORDER — ACETAMINOPHEN 325 MG PO TABS
650.0000 mg | ORAL_TABLET | Freq: Four times a day (QID) | ORAL | Status: DC | PRN
Start: 2024-04-08 — End: 2024-04-11
  Administered 2024-04-11: 650 mg via ORAL
  Filled 2024-04-08: qty 2

## 2024-04-08 MED ORDER — PANTOPRAZOLE SODIUM 40 MG IV SOLR
40.0000 mg | INTRAVENOUS | Status: DC
  Administered 2024-04-08 – 2024-04-12 (×5): 40 mg via INTRAVENOUS
  Filled 2024-04-08 (×5): qty 10

## 2024-04-08 MED ORDER — EZETIMIBE 10 MG PO TABS
10.0000 mg | ORAL_TABLET | Freq: Every day | ORAL | Status: DC
  Administered 2024-04-09 – 2024-04-12 (×5): 10 mg via ORAL
  Filled 2024-04-08 (×5): qty 1

## 2024-04-08 MED ORDER — AZATHIOPRINE 25 MG PO HALF TABLET
25.0000 mg | ORAL_TABLET | Freq: Every day | ORAL | Status: DC
Start: 2024-04-09 — End: 2024-04-13
  Administered 2024-04-09 – 2024-04-13 (×5): 25 mg via ORAL
  Filled 2024-04-08 (×6): qty 1

## 2024-04-08 MED ORDER — SODIUM CHLORIDE 0.9 % IV SOLN
500.0000 mg | INTRAVENOUS | Status: DC
  Administered 2024-04-09 – 2024-04-10 (×2): 500 mg via INTRAVENOUS
  Filled 2024-04-08 (×3): qty 5

## 2024-04-08 MED ORDER — INSULIN ASPART 100 UNIT/ML IJ SOLN
0.0000 [IU] | Freq: Three times a day (TID) | INTRAMUSCULAR | Status: DC
  Administered 2024-04-09: 3 [IU] via SUBCUTANEOUS
  Administered 2024-04-09: 2 [IU] via SUBCUTANEOUS
  Administered 2024-04-09: 3 [IU] via SUBCUTANEOUS
  Administered 2024-04-10 (×2): 2 [IU] via SUBCUTANEOUS
  Administered 2024-04-10: 3 [IU] via SUBCUTANEOUS
  Administered 2024-04-11 (×2): 2 [IU] via SUBCUTANEOUS
  Administered 2024-04-12: 5 [IU] via SUBCUTANEOUS
  Administered 2024-04-12: 2 [IU] via SUBCUTANEOUS
  Administered 2024-04-12: 9 [IU] via SUBCUTANEOUS
  Administered 2024-04-13: 2 [IU] via SUBCUTANEOUS
  Administered 2024-04-13: 1 [IU] via SUBCUTANEOUS
  Filled 2024-04-08: qty 0.09

## 2024-04-08 MED ORDER — SODIUM CHLORIDE 0.9 % IV SOLN
500.0000 mg | Freq: Once | INTRAVENOUS | Status: AC
  Administered 2024-04-08: 500 mg via INTRAVENOUS
  Filled 2024-04-08: qty 5

## 2024-04-08 MED ORDER — LABETALOL HCL 200 MG PO TABS
200.0000 mg | ORAL_TABLET | Freq: Two times a day (BID) | ORAL | Status: DC
  Administered 2024-04-09 – 2024-04-13 (×10): 200 mg via ORAL
  Filled 2024-04-08 (×5): qty 1
  Filled 2024-04-08: qty 2
  Filled 2024-04-08 (×4): qty 1

## 2024-04-08 NOTE — Consult Note (Signed)
 Reason for Consult:acute appendicitis Referring Physician: [name] is an 68 y.o. female.  HPI:  Pt presented to the PCP with nausea and vomiting.  She was also having shortness of breath.  There was concern of aspiration pneumonia but with the abdominal symptoms and RLQ tenderness they sent her for CT to eval for appendicitis.  The pain has been having pain for at least 2 days.  Upon arrival to the ED, patient was slightly hypotensive, but responded to fluid.  Pt denies diarrhea or constipation. Pt's husband reports minimal PO intake for several days.    OF note, patient has h/o kidney transplant (3 years ago, [facility]), DM, HTN.    Also, patient has been getting some gyn workup and Dr. Latisha is supposed to do hysteroscopy on Friday.    Past Medical History:  Diagnosis Date   Adenomatous colon polyp 08/2007   Anemia    Diabetes mellitus type 2, controlled (HCC)    type 2   Dialysis patient 01/2017   M-W-F   End stage renal disease (HCC)     diaylis sunday tuesdays and thurs does at home   Goiter    Gout    Hyperlipidemia    Hypertension    Hyperthyroidism    Kidney disease    Kidney transplant failure    8 years ago   Osteopenia    Pneumonia    2018   Seasonal allergies    Vitamin B12 deficiency anemia due to intrinsic factor deficiency     Past Surgical History:  Procedure Laterality Date   A/V FISTULAGRAM N/A 10/03/2019   Procedure: A/V FISTULAGRAM - Left;  Surgeon: Sheree Penne Bruckner, MD;  Location: [facility] INVASIVE CV LAB;  Service: Cardiovascular;  Laterality: N/A;   AV FISTULA PLACEMENT Left 2008   colonscopy  02/2016   FISTULOGRAM Left 05/18/2019   Procedure: LEFT ARM FISTULOGRAM WITH BALLOON ANGIOPLASTY OF LEFT cephalic arch with 8mm balloon;  Surgeon: Sheree Penne Bruckner, MD;  Location: [facility] OR;  Service: Vascular;  Laterality: Left;   GANGLION CYST EXCISION Right 1994   KIDNEY TRANSPLANT  June 2010   MINOR FULGERATION OF ANAL CONDYLOMA N/A  07/31/2017   Procedure: EXCISION  FULGERATION OF ANAL CONDYLOMA;  Surgeon: Teresa Bruckner HERO, MD;  Location: [facility] ORS;  Service: General;  Laterality: N/A;   PERIPHERAL VASCULAR BALLOON ANGIOPLASTY Left 10/03/2019   Procedure: PERIPHERAL VASCULAR BALLOON ANGIOPLASTY;  Surgeon: Sheree Penne Bruckner, MD;  Location: [facility] INVASIVE CV LAB;  Service: Cardiovascular;  Laterality: Left;  AVF   RECTAL EXAM UNDER ANESTHESIA N/A 07/03/2016   Procedure: RECTAL EXAM UNDER ANESTHESIA;  Surgeon: Camellia Blush, MD;  Location: [facility] ORS;  Service: General;  Laterality: N/A;   REVISON OF ARTERIOVENOUS FISTULA Left 05/18/2019   Procedure: revision Of  left upper arm Arteriovenous Fistula with excision of aneurysm;  Surgeon: Sheree Penne Bruckner, MD;  Location: [facility] OR;  Service: Vascular;  Laterality: Left;   SHOULDER ARTHROSCOPY W/ ROTATOR CUFF REPAIR Right yrs ago   UPPER GI ENDOSCOPY  sept   2017   WART FULGURATION N/A 07/03/2016   Procedure: EXCISION FULGURATION ANAL WART EXCISIONAL BIOSY OF EXTERNAL HEMORROID;  Surgeon: Camellia Blush, MD;  Location: [facility] ORS;  Service: General;  Laterality: N/A;    Family History  Problem Relation Age of Onset   Heart disease Mother    Cancer - Other Father        gum to throat   Colon cancer Sister    Colon  cancer Brother    Esophageal cancer Neg Hx    Stomach cancer Neg Hx    Rectal cancer Neg Hx     Social History:  reports that she quit smoking about 26 years ago. Her smoking use included cigarettes. She started smoking about 46 years ago. She has a 20 pack-year smoking history. She has never used smokeless tobacco. She reports current alcohol  use. She reports that she does not use drugs.  Allergies:  Allergies  Allergen Reactions   Sucroferric Oxyhydroxide     Other reaction(s): Flushing (Red Skin)   Other Hives    tape   Tape Hives   Phoslo  [Calcium  Acetate] Rash    Medications:  acetaminophen  (TYLENOL ) 500 MG tablet amLODipine  (NORVASC ) 10 MG  tablet amoxicillin  (AMOXIL ) 500 MG capsule azaTHIOprine  (IMURAN ) 50 MG tablet ezetimibe -simvastatin  (VYTORIN ) 10-20 MG per tablet glucose blood (ONETOUCH VERIO) test strip insulin  aspart (FIASP  FLEXTOUCH) 100 UNIT/ML FlexTouch Pen labetalol  (NORMODYNE ) 200 MG tablet magnesium  oxide (MAG-OX) 400 MG tablet pantoprazole  (PROTONIX ) 40 MG tablet polyethylene glycol (MIRALAX / GLYCOLAX) 17 g packet predniSONE  (DELTASONE ) 5 MG tablet senna-docusate (SENOKOT-S) 8.6-50 MG tablet sulfamethoxazole-trimethoprim (BACTRIM) 400-80 MG tablet tacrolimus  (PROGRAF ) 1 MG capsule TOUJEO  MAX SOLOSTAR 300 UNIT/ML SOPN   Results for orders placed or performed during the hospital encounter of 04/08/24 (from the past 48 hours)  Lipase, blood     Status: None   Collection Time: 04/08/24 11:16 AM  Result Value Ref Range   Lipase 15 11 - 51 U/L    Comment: Performed at [facility], [address] [place], [place] [address]  Comprehensive metabolic panel     Status: Abnormal   Collection Time: 04/08/24 11:16 AM  Result Value Ref Range   Sodium 130 (L) 135 - 145 mmol/L   Potassium 3.9 3.5 - 5.1 mmol/L   Chloride 95 (L) 98 - 111 mmol/L   CO2 21 (L) 22 - 32 mmol/L   Glucose, Bld 234 (H) 70 - 99 mg/dL    Comment: Glucose reference range applies only to samples taken after fasting for at least 8 hours.   BUN 29 (H) 8 - 23 mg/dL   Creatinine, Ser 8.27 (H) 0.44 - 1.00 mg/dL   Calcium  9.7 8.9 - 10.3 mg/dL   Total Protein 5.8 (L) 6.5 - 8.1 g/dL   Albumin 3.6 3.5 - 5.0 g/dL   AST 19 15 - 41 U/L   ALT 14 0 - 44 U/L   Alkaline Phosphatase 87 38 - 126 U/L   Total Bilirubin 1.4 (H) 0.0 - 1.2 mg/dL   GFR, Estimated 32 (L) >60 mL/min    Comment: (NOTE) Calculated using the CKD-EPI Creatinine Equation (2021)    Anion gap 13 5 - 15    Comment: Performed at [facility], [address], [place], [place] [address]  CBC     Status: Abnormal   Collection Time: 04/08/24 11:16 AM   Result Value Ref Range   WBC 14.5 (H) 4.0 - 10.5 K/uL   RBC 4.26 3.87 - 5.11 MIL/uL   Hemoglobin 12.8 12.0 - 15.0 g/dL   HCT 60.3 63.9 - 53.9 %   MCV 93.0 80.0 - 100.0 fL   MCH 30.0 26.0 - 34.0 pg   MCHC 32.3 30.0 - 36.0 g/dL   RDW 86.7 88.4 - 84.4 %   Platelets 135 (L) 150 - 400 K/uL   nRBC 0.0 0.0 - 0.2 %    Comment: Performed at [facility], [address] [place], [place] [address]  Protime-INR     Status: Abnormal   Collection Time: 04/08/24  3:32 PM  Result Value Ref Range   Prothrombin Time 15.8 (H) 11.4 - 15.2 seconds   INR 1.2 0.8 - 1.2    Comment: (NOTE) INR goal varies based on device and disease states. Performed at [facility], [address] [place], [place] [address]     CT ABDOMEN PELVIS WO CONTRAST Addendum Date: 04/08/2024 ADDENDUM #1  EXAM: CT ABDOMEN AND PELVIS WITHOUT CONTRAST 04/08/2024 05:36:04 PM TECHNIQUE: CT of the abdomen and pelvis was performed without the administration of intravenous contrast. Multiplanar reformatted images are provided for review. Automated exposure control, iterative reconstruction, and/or weight-based adjustment of the mA/kV was utilized to reduce the radiation dose to as low as reasonably achievable. COMPARISON: Ultrasound of 02/11/2016. No prior CT. CLINICAL HISTORY: lower abdominal pain, prior renal transplant. FINDINGS: LOWER CHEST: Tiny right pleural effusion. Right greater than left lung base airspace disease. Moderate cardiomegaly. Small hiatal hernia. Coronary artery calcification. LIVER: Hepatic cysts of up to 1.8 cm. Low density capsular-based right hepatic lobe lesions of up to 2.1 x 2.5 cm (series 26, image 2). Suspect subtle gas adjacent the right hepatic capsule on image 30/2, just cephalad to the appendiceal tip. GALLBLADDER AND BILE DUCTS: Gallbladder is unremarkable. No biliary ductal dilatation. SPLEEN: No acute abnormality. PANCREAS: Mild pancreatic atrophy. ADRENAL GLANDS: No  acute abnormality. KIDNEYS, URETERS AND BLADDER: Bilateral native kidneys demonstrate moderate to marked atrophy. Upper pole right native renal artery 1.1 cm fluid density lesion is likely a cyst and does not warrant specific imaging follow-up. Lower pole left native renal artery 1.9 cm low-density lesion is also likely a cyst and does not warrant imaging follow-up. Negative left native renal artery punctate stone versus renal vascular calcification. Atrophic right iliac fossa renal transplant with presumed vascular calcifications. Left iliac fossa renal transplant without hydronephrosis or surrounding fluid collection. No bladder calculi. No hydronephrosis of the native kidneys. . Urinary bladder is unremarkable. GI AND BOWEL: Apparent gastric body wall thickening is likely due to underdistention. Periampullary duodenal diverticulum. Scattered colonic diverticula. Normal terminal ileum. The appendix originates on image 51/2 with multiple appendicoliths within including on image 49/2. The appendix is dilated at up to 1.8 cm on image 45/2 with moderate surrounding edema. There is no bowel obstruction. PERITONEUM AND RETROPERITONEUM: No ascites. No free air. VASCULATURE: Aorta is normal in caliber. Advanced aortic atherosclerosis. LYMPH NODES: No lymphadenopathy. REPRODUCTIVE ORGANS: Suspected posterior uterine fundal partially calcified 11 mm fibroid. No adnexal mass. BONES AND SOFT TISSUES: Prominent thoracolumbar spondylosis. Eccentric right disc bulge at L3-L4 including on sagittal image 102/9. Mild pelvic floor laxity. No acute osseous abnormality. No focal soft tissue abnormality. IMPRESSION: 1. Findings highly suspicious for acute appendicitis with microperforation and extraluminal gas adjacent to the right hepatic lobe. 2. Capsular-based hypoattenuating liver lesions are indeterminate on this noncontrast CT but are suspicious for hepatic abscesses, given proximity to the inflammatory process. 3. Bibasilar  airspace disease with trace right pleural effusion; although this may reflect atelectasis, infectionparticularly at the right lung baseis a concern. 4. Probable small uterine fundal fibroid. 5. Large L3-L4 eccentric right disc bulge. 6. Cardiomegaly. Aortic atherosclerosis (ICD-10 I70.0). Coronary artery atherosclerosis. Case discussed with Dr rapp at 6:45 pm. Electronically signed by: Rockey Kilts MD 04/08/2024 06:52 PM EDT RP Workstation: [id number]   Result Date: 04/08/2024 **ORIGINAL REPORT * EXAM: CT ABDOMEN AND PELVIS WITHOUT CONTRAST 04/08/2024 05:36:04  PM TECHNIQUE: CT of the abdomen and pelvis was performed without the administration of intravenous contrast. Multiplanar reformatted images are provided for review. Automated exposure control, iterative reconstruction, and/or weight-based adjustment of the mA/kV was utilized to reduce the radiation dose to as low as reasonably achievable. COMPARISON: Ultrasound of 02/11/2016. No prior CT. CLINICAL HISTORY: lower abdominal pain, prior renal transplant. FINDINGS: LOWER CHEST: Tiny right pleural effusion. Right greater than left lung base airspace disease. Moderate cardiomegaly. Small hiatal hernia. Coronary artery calcification. LIVER: Hepatic cysts of up to 1.8 cm. Low density capsular-based right hepatic lobe lesions of up to 2.1 x 2.5 cm (series 26, image 2). Suspect subtle gas adjacent the right hepatic capsule on image 30/2, just cephalad to the appendiceal tip. GALLBLADDER AND BILE DUCTS: Gallbladder is unremarkable. No biliary ductal dilatation. SPLEEN: No acute abnormality. PANCREAS: Mild pancreatic atrophy. ADRENAL GLANDS: No acute abnormality. KIDNEYS, URETERS AND BLADDER: Bilateral native kidneys demonstrate moderate to marked atrophy. Upper pole right native renal artery 1.1 cm fluid density lesion is likely a cyst and does not warrant specific imaging follow-up. Lower pole left native renal artery 1.9 cm low-density lesion is also likely a cyst  and does not warrant imaging follow-up. Negative left native renal artery punctate stone versus renal vascular calcification. Atrophic right iliac fossa renal transplant with presumed vascular calcifications. Left iliac fossa renal transplant without hydronephrosis or surrounding fluid collection. No bladder calculi. No hydronephrosis of the native kidneys. . Urinary bladder is unremarkable. GI AND BOWEL: Apparent gastric body wall thickening is likely due to underdistention. Periampullary duodenal diverticulum. Scattered colonic diverticula. Normal terminal ileum. The appendix originates on image 51/2 with multiple appendicoliths within including on image 49/2. The appendix is dilated at up to 1.8 cm on image 45/2 with moderate surrounding edema. There is no bowel obstruction. PERITONEUM AND RETROPERITONEUM: No ascites. No free air. VASCULATURE: Aorta is normal in caliber. Advanced aortic atherosclerosis. LYMPH NODES: No lymphadenopathy. REPRODUCTIVE ORGANS: Suspected posterior uterine fundal partially calcified 11 mm fibroid. No adnexal mass. BONES AND SOFT TISSUES: Prominent thoracolumbar spondylosis. Eccentric right disc bulge at L3-L4 including on sagittal image 102/9. Mild pelvic floor laxity. No acute osseous abnormality. No focal soft tissue abnormality. IMPRESSION: 1. Findings highly suspicious for acute appendicitis with microperforation and extraluminal gas adjacent to the right hepatic lobe. 2. Capsular-based hypoattenuating liver lesions are indeterminate on this noncontrast CT but are suspicious for hepatic abscesses, given proximity to the inflammatory process. 3. Bibasilar airspace disease with trace right pleural effusion; although this may reflect atelectasis, infectionparticularly at the right lung baseis a concern. 4. Probable small uterine fundal fibroid. 5. Large L3-L4 eccentric right disc bulge. 6. Cardiomegaly. Aortic atherosclerosis (ICD-10 I70.0). Coronary artery atherosclerosis. A return  call from the clinical service is pending. Electronically signed by: Rockey Kilts MD 04/08/2024 06:21 PM EDT RP Workstation: [id number]   DG Chest 2 View Result Date: 04/08/2024 EXAM: 2 VIEW(S) XRAY OF THE CHEST 04/08/2024 11:53:51 AM COMPARISON: 05/05/220 CLINICAL HISTORY: ? PNA. Per chart: Pt presents after being sent from PCP for dx of pneumonia with suspicion for aspiration pneumonia. Pt also has right lower quadrant pain they felt was suspicous for appendicitis. Initially had n/v but not today. FINDINGS: LUNGS AND PLEURA: Increased bibasilar opacities are noted concerning for worsening atelectasis or pneumonia. Small right pleural effusion is noted. No pulmonary edema. No pneumothorax. HEART AND MEDIASTINUM: Stable cardiomediastinal silhouette. BONES AND SOFT TISSUES: No acute osseous abnormality. IMPRESSION: 1. Increased bibasilar opacities, which may reflect worsening atelectasis or pneumonia.  2. Small right pleural effusion. Electronically signed by: Lynwood Seip MD 04/08/2024 12:23 PM EDT RP Workstation: [id number]    Review of Systems  Constitutional:  Positive for appetite change.  HENT: Negative.    Eyes: Negative.   Respiratory:  Positive for shortness of breath.   Cardiovascular: Negative.   Gastrointestinal:  Positive for abdominal pain, nausea and vomiting.  Endocrine: Negative.   Genitourinary:        Gyn workup ? Abnormal cells Decreased UOP  Musculoskeletal: Negative.   Allergic/Immunologic: Negative.   Neurological: Negative.   Hematological: Negative.   Psychiatric/Behavioral: Negative.    All other systems reviewed and are negative.   Blood pressure (!) 157/70, pulse 85, temperature 98.7 F (37.1 C), resp. rate (!) 29, height 5' 5 (1.651 m), weight 66.2 kg, SpO2 96%. Physical Exam Vitals reviewed.  Constitutional:      Appearance: She is well-developed and normal weight. She is ill-appearing. She is not toxic-appearing or diaphoretic.  HENT:     Head:  Normocephalic and atraumatic.     Mouth/Throat:     Mouth: Mucous membranes are moist.  Eyes:     General: No scleral icterus. Cardiovascular:     Rate and Rhythm: Normal rate and regular rhythm.     Heart sounds: Normal heart sounds. No murmur heard.    No friction rub. No gallop.  Pulmonary:     Effort: Pulmonary effort is normal. No respiratory distress.     Breath sounds: Normal breath sounds. No stridor. No wheezing or rhonchi.  Abdominal:     General: Abdomen is protuberant. Bowel sounds are decreased. There is no abdominal bruit. There are no signs of injury.     Palpations: Abdomen is soft. There is no shifting dullness, fluid wave, hepatomegaly, splenomegaly, mass or pulsatile mass.     Tenderness: There is abdominal tenderness (minimal RLQ). There is no guarding or rebound. Negative signs include Murphy's sign, Rovsing's sign, McBurney's sign and psoas sign.     Hernia: No hernia is present.  Skin:    General: Skin is warm and dry.     Capillary Refill: Capillary refill takes 2 to 3 seconds.     Coloration: Skin is not cyanotic, jaundiced, mottled or pale.  Neurological:     General: No focal deficit present.     Mental Status: She is alert and oriented to person, place, and time.     Cranial Nerves: No cranial nerve deficit.     Motor: No weakness.  Psychiatric:        Mood and Affect: Mood normal. Mood is not anxious or depressed.        Behavior: Behavior normal.      Assessment/Plan: Acute perforated appendicitis  Hepatic abscesses S/p kidney transplant Hyponatremia Hypochloremia Acute kidney injury  DM type 2   Likely this has been going on a bit longer than 2 days given the hepatic abscesses. There is quite a bit of inflammatory change in the retrocecal region with the appendix.  Pain can be masked in this location.    Recommend IV antibiotics and bowel rest.  Clears ok if tolerated.  Liver abscesses relatively small. Consider perc aspiration for  sampling and culture.  Pt at risk for retrocecal abscess as well.   Will plan repeat CT in several days.      Kimberly [name] Nephew, MD, FACS, FSSO Surgical Oncology, General Surgery, Trauma and Critical [facility], [place] [phone] for weekday/non holidays Check [email or web address] for coverage night/weekend/holidays

## 2024-04-08 NOTE — Assessment & Plan Note (Signed)
 Initially hypotensive, but has been hypertensive since being in ED  Continue labetalol  200mg  BID Hold norvasc  and see if needs in AM

## 2024-04-08 NOTE — Assessment & Plan Note (Signed)
-  Hx of immunosuppression secondary to renal transplant  -denies sick contacts/other upper respiratory symptoms  -Decreased breath sounds in RLL with productive cough, shortness of breath and tachypnea and findings on CXR for possible pneumonia -continue rocephin and azithromycin  -BC pending  -check urinary strep antigens  -sputum culture -covid/flu/RSV swab  -droplet precautions for now  -IS to bedside

## 2024-04-08 NOTE — ED Provider Notes (Signed)
 [place] EMERGENCY DEPARTMENT AT [facility] Provider Note   CSN: [id number] Arrival date & time: 04/08/24  1056     Patient presents with: Abdominal Pain   Kimberly [name] is a 68 y.o. female.    Abdominal Pain  Pt has history of DM, htn  Pt has bene having trouble with shortness of breath, vomiting, abdominal pain.  Pt went to the primary care doctors office today and was diagnosed with PNA.  They felt she had possible appendicitis.  Sent to the ED for evaluation.  Pt has had decreased appetite.  She has been coughing.  SHe has also felt very weak when trying to walk.  No known fevers.  Prior to Admission medications   Medication Sig Start Date End Date Taking? Authorizing Provider  acetaminophen  (TYLENOL ) 500 MG tablet Take by mouth.    [provider]  amLODipine  (NORVASC ) 10 MG tablet Take 10 mg by mouth every evening.     [provider]  amoxicillin  (AMOXIL ) 500 MG capsule Take 2,000 mg by mouth See admin instructions. Take 4 capsules (2000 mg) by mouth 1 hour prior to dental work 04/26/19   [provider]  azaTHIOprine  (IMURAN ) 50 MG tablet Take 1 tablet by mouth daily. 10/06/22   [provider]  ezetimibe -simvastatin  (VYTORIN ) 10-20 MG per tablet Take 1 tablet by mouth at bedtime.    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 Inject 7 units in skin in the morning and at lunchtime. Inject 5 units in the evening with meals    [provider]  labetalol  (NORMODYNE ) 200 MG tablet Take 1 tablet (200 mg total) by mouth 2 (two) times daily. Patient taking differently: Take 300 mg by mouth 2 (two) times daily. 06/25/16   Rojelio Nest, DO  magnesium  oxide (MAG-OX) 400 MG tablet  Take 2 tablets by mouth 2 (two) times daily.    [provider]  pantoprazole  (PROTONIX ) 40 MG tablet Take 40 mg by mouth daily.    [provider]  polyethylene glycol (MIRALAX / GLYCOLAX) 17 g packet Take 17 g by mouth daily as needed. 12/03/20   [provider]  predniSONE  (DELTASONE ) 5 MG tablet Take 5 mg by mouth daily.    [provider]  senna-docusate (SENOKOT-S) 8.6-50 MG tablet Take 1 tablet by mouth at bedtime as needed. 12/03/20   [provider]  sulfamethoxazole-trimethoprim (BACTRIM) 400-80 MG tablet Take 1 tablet by mouth 3 (three) times a week.    [provider]  tacrolimus  (PROGRAF ) 1 MG capsule 2 (two) times daily. 5 capsules in the morning and 4 capsules in the evening    [provider]  TOUJEO  MAX SOLOSTAR 300 UNIT/ML SOPN Inject 16 Units into the skin every evening. 03/09/19   [provider]    Allergies: Sucroferric oxyhydroxide, Other, Tape, and Phoslo  [calcium  acetate]    Review of Systems  Gastrointestinal:  Positive for abdominal pain.    Updated Vital Signs BP (!) 157/70   Pulse 85   Temp 98.7 F (37.1 C)   Resp (!) 29   Ht 1.651 m (5' 5)   Wt 66.2 kg   SpO2 96%   BMI 24.30 kg/m   Physical Exam Vitals and  nursing note reviewed.  Constitutional:      General: She is not in acute distress.    Appearance: She is well-developed.  HENT:     Head: Normocephalic and atraumatic.     Right Ear: External ear normal.     Left Ear: External ear normal.  Eyes:     General: No scleral icterus.       Right eye: No discharge.        Left eye: No discharge.     Conjunctiva/sclera: Conjunctivae normal.  Neck:     Trachea: No tracheal deviation.  Cardiovascular:     Rate and Rhythm: Normal rate and regular rhythm.  Pulmonary:     Effort: Pulmonary effort is normal. No respiratory distress.     Breath sounds: Normal breath sounds. No stridor. No wheezing or rales.  Abdominal:      General: Bowel sounds are normal. There is no distension.     Palpations: Abdomen is soft.     Tenderness: There is no abdominal tenderness. There is no guarding or rebound.  Musculoskeletal:        General: No tenderness or deformity.     Cervical back: Neck supple.  Skin:    General: Skin is warm and dry.     Findings: No rash.  Neurological:     General: No focal deficit present.     Mental Status: She is alert.     Cranial Nerves: No cranial nerve deficit, dysarthria or facial asymmetry.     Sensory: No sensory deficit.     Motor: No abnormal muscle tone or seizure activity.     Coordination: Coordination normal.  Psychiatric:        Mood and Affect: Mood normal.     (all labs ordered are listed, but only abnormal results are displayed) Labs Reviewed  COMPREHENSIVE METABOLIC PANEL WITH GFR - Abnormal; Notable for the following components:      Result Value   Sodium 130 (*)    Chloride 95 (*)    CO2 21 (*)    Glucose, Bld 234 (*)    BUN 29 (*)    Creatinine, Ser 1.72 (*)    Total Protein 5.8 (*)    Total Bilirubin 1.4 (*)    GFR, Estimated 32 (*)    All other components within normal limits  CBC - Abnormal; Notable for the following components:   WBC 14.5 (*)    Platelets 135 (*)    All other components within normal limits  PROTIME-INR - Abnormal; Notable for the following components:   Prothrombin Time 15.8 (*)    All other components within normal limits  CULTURE, BLOOD (ROUTINE X 2)  CULTURE, BLOOD (ROUTINE X 2)  LIPASE, BLOOD  URINALYSIS, W/ REFLEX TO CULTURE (INFECTION SUSPECTED)  PROCALCITONIN  PROCALCITONIN  I-STAT CG4 LACTIC ACID, ED  I-STAT CG4 LACTIC ACID, ED    EKG: EKG Interpretation Date/Time:  Friday April 08 2024 18:00:41 EDT Ventricular Rate:  85 PR Interval:  149 QRS Duration:  86 QT Interval:  381 QTC Calculation: 453 R Axis:   82  Text Interpretation: Sinus rhythm Probable left atrial enlargement Borderline right axis deviation  Probable left ventricular hypertrophy Anterior Q waves, possibly due to LVH No significant change since last tracing Confirmed by Randol Simmonds [phone]) on 04/08/2024 6:52:59 PM  Radiology: CT ABDOMEN PELVIS WO CONTRAST Addendum Date: 04/08/2024 ** ADDENDUM #1 ** EXAM: CT ABDOMEN AND PELVIS WITHOUT CONTRAST 04/08/2024 05:36:04 PM TECHNIQUE: CT of the  abdomen and pelvis was performed without the administration of intravenous contrast. Multiplanar reformatted images are provided for review. Automated exposure control, iterative reconstruction, and/or weight-based adjustment of the mA/kV was utilized to reduce the radiation dose to as low as reasonably achievable. COMPARISON: Ultrasound of 02/11/2016. No prior CT. CLINICAL HISTORY: lower abdominal pain, prior renal transplant. FINDINGS: LOWER CHEST: Tiny right pleural effusion. Right greater than left lung base airspace disease. Moderate cardiomegaly. Small hiatal hernia. Coronary artery calcification. LIVER: Hepatic cysts of up to 1.8 cm. Low density capsular-based right hepatic lobe lesions of up to 2.1 x 2.5 cm (series 26, image 2). Suspect subtle gas adjacent the right hepatic capsule on image 30/2, just cephalad to the appendiceal tip. GALLBLADDER AND BILE DUCTS: Gallbladder is unremarkable. No biliary ductal dilatation. SPLEEN: No acute abnormality. PANCREAS: Mild pancreatic atrophy. ADRENAL GLANDS: No acute abnormality. KIDNEYS, URETERS AND BLADDER: Bilateral native kidneys demonstrate moderate to marked atrophy. Upper pole right native renal artery 1.1 cm fluid density lesion is likely a cyst and does not warrant specific imaging follow-up. Lower pole left native renal artery 1.9 cm low-density lesion is also likely a cyst and does not warrant imaging follow-up. Negative left native renal artery punctate stone versus renal vascular calcification. Atrophic right iliac fossa renal transplant with presumed vascular calcifications. Left iliac fossa renal  transplant without hydronephrosis or surrounding fluid collection. No bladder calculi. No hydronephrosis of the native kidneys. . Urinary bladder is unremarkable. GI AND BOWEL: Apparent gastric body wall thickening is likely due to underdistention. Periampullary duodenal diverticulum. Scattered colonic diverticula. Normal terminal ileum. The appendix originates on image 51/2 with multiple appendicoliths within including on image 49/2. The appendix is dilated at up to 1.8 cm on image 45/2 with moderate surrounding edema. There is no bowel obstruction. PERITONEUM AND RETROPERITONEUM: No ascites. No free air. VASCULATURE: Aorta is normal in caliber. Advanced aortic atherosclerosis. LYMPH NODES: No lymphadenopathy. REPRODUCTIVE ORGANS: Suspected posterior uterine fundal partially calcified 11 mm fibroid. No adnexal mass. BONES AND SOFT TISSUES: Prominent thoracolumbar spondylosis. Eccentric right disc bulge at L3-L4 including on sagittal image 102/9. Mild pelvic floor laxity. No acute osseous abnormality. No focal soft tissue abnormality. IMPRESSION: 1. Findings highly suspicious for acute appendicitis with microperforation and extraluminal gas adjacent to the right hepatic lobe. 2. Capsular-based hypoattenuating liver lesions are indeterminate on this noncontrast CT but are suspicious for hepatic abscesses, given proximity to the inflammatory process. 3. Bibasilar airspace disease with trace right pleural effusion; although this may reflect atelectasis, infectionparticularly at the right lung baseis a concern. 4. Probable small uterine fundal fibroid. 5. Large L3-L4 eccentric right disc bulge. 6. Cardiomegaly. Aortic atherosclerosis (ICD-10 I70.0). Coronary artery atherosclerosis. Case discussed with Dr rapp at 6:45 pm. Electronically signed by: Rockey Kilts MD 04/08/2024 06:52 PM EDT RP Workstation: [id number]   Result Date: 04/08/2024 ** ORIGINAL REPORT ** EXAM: CT ABDOMEN AND PELVIS WITHOUT CONTRAST 04/08/2024  05:36:04 PM TECHNIQUE: CT of the abdomen and pelvis was performed without the administration of intravenous contrast. Multiplanar reformatted images are provided for review. Automated exposure control, iterative reconstruction, and/or weight-based adjustment of the mA/kV was utilized to reduce the radiation dose to as low as reasonably achievable. COMPARISON: Ultrasound of 02/11/2016. No prior CT. CLINICAL HISTORY: lower abdominal pain, prior renal transplant. FINDINGS: LOWER CHEST: Tiny right pleural effusion. Right greater than left lung base airspace disease. Moderate cardiomegaly. Small hiatal hernia. Coronary artery calcification. LIVER: Hepatic cysts of up to 1.8 cm. Low density capsular-based right hepatic lobe lesions of up  to 2.1 x 2.5 cm (series 26, image 2). Suspect subtle gas adjacent the right hepatic capsule on image 30/2, just cephalad to the appendiceal tip. GALLBLADDER AND BILE DUCTS: Gallbladder is unremarkable. No biliary ductal dilatation. SPLEEN: No acute abnormality. PANCREAS: Mild pancreatic atrophy. ADRENAL GLANDS: No acute abnormality. KIDNEYS, URETERS AND BLADDER: Bilateral native kidneys demonstrate moderate to marked atrophy. Upper pole right native renal artery 1.1 cm fluid density lesion is likely a cyst and does not warrant specific imaging follow-up. Lower pole left native renal artery 1.9 cm low-density lesion is also likely a cyst and does not warrant imaging follow-up. Negative left native renal artery punctate stone versus renal vascular calcification. Atrophic right iliac fossa renal transplant with presumed vascular calcifications. Left iliac fossa renal transplant without hydronephrosis or surrounding fluid collection. No bladder calculi. No hydronephrosis of the native kidneys. . Urinary bladder is unremarkable. GI AND BOWEL: Apparent gastric body wall thickening is likely due to underdistention. Periampullary duodenal diverticulum. Scattered colonic diverticula. Normal  terminal ileum. The appendix originates on image 51/2 with multiple appendicoliths within including on image 49/2. The appendix is dilated at up to 1.8 cm on image 45/2 with moderate surrounding edema. There is no bowel obstruction. PERITONEUM AND RETROPERITONEUM: No ascites. No free air. VASCULATURE: Aorta is normal in caliber. Advanced aortic atherosclerosis. LYMPH NODES: No lymphadenopathy. REPRODUCTIVE ORGANS: Suspected posterior uterine fundal partially calcified 11 mm fibroid. No adnexal mass. BONES AND SOFT TISSUES: Prominent thoracolumbar spondylosis. Eccentric right disc bulge at L3-L4 including on sagittal image 102/9. Mild pelvic floor laxity. No acute osseous abnormality. No focal soft tissue abnormality. IMPRESSION: 1. Findings highly suspicious for acute appendicitis with microperforation and extraluminal gas adjacent to the right hepatic lobe. 2. Capsular-based hypoattenuating liver lesions are indeterminate on this noncontrast CT but are suspicious for hepatic abscesses, given proximity to the inflammatory process. 3. Bibasilar airspace disease with trace right pleural effusion; although this may reflect atelectasis, infectionparticularly at the right lung baseis a concern. 4. Probable small uterine fundal fibroid. 5. Large L3-L4 eccentric right disc bulge. 6. Cardiomegaly. Aortic atherosclerosis (ICD-10 I70.0). Coronary artery atherosclerosis. A return call from the clinical service is pending. Electronically signed by: Rockey Kilts MD 04/08/2024 06:21 PM EDT RP Workstation: [id number]   DG Chest 2 View Result Date: 04/08/2024 EXAM: 2 VIEW(S) XRAY OF THE CHEST 04/08/2024 11:53:51 AM COMPARISON: 05/05/220 CLINICAL HISTORY: ? PNA. Per chart: Pt presents after being sent from PCP for dx of pneumonia with suspicion for aspiration pneumonia. Pt also has right lower quadrant pain they felt was suspicous for appendicitis. Initially had n/v but not today. FINDINGS: LUNGS AND PLEURA: Increased bibasilar  opacities are noted concerning for worsening atelectasis or pneumonia. Small right pleural effusion is noted. No pulmonary edema. No pneumothorax. HEART AND MEDIASTINUM: Stable cardiomediastinal silhouette. BONES AND SOFT TISSUES: No acute osseous abnormality. IMPRESSION: 1. Increased bibasilar opacities, which may reflect worsening atelectasis or pneumonia. 2. Small right pleural effusion. Electronically signed by: Lynwood Seip MD 04/08/2024 12:23 PM EDT RP Workstation: [id number]     .Critical Care  Performed by: Randol Simmonds, MD Authorized by: Randol Simmonds, MD   Critical care provider statement:    Critical care time (minutes):  45   Critical care was time spent personally by me on the following activities:  Development of treatment plan with patient or surrogate, discussions with consultants, evaluation of patient's response to treatment, examination of patient, ordering and review of laboratory studies, ordering and review of radiographic studies, ordering and performing treatments and  interventions, pulse oximetry, re-evaluation of patient's condition and review of old charts    Medications Ordered in the ED  metroNIDAZOLE (FLAGYL) IVPB 500 mg (500 mg Intravenous New Bag/Given 04/08/24 1847)  cefTRIAXone (ROCEPHIN) 2 g in sodium chloride  0.9 % 100 mL IVPB (has no administration in time range)  lactated ringers  bolus 1,000 mL (0 mLs Intravenous Stopped 04/08/24 1715)  cefTRIAXone (ROCEPHIN) 1 g in sodium chloride  0.9 % 100 mL IVPB (0 g Intravenous Stopped 04/08/24 1700)  azithromycin (ZITHROMAX) 500 mg in sodium chloride  0.9 % 250 mL IVPB (500 mg Intravenous New Bag/Given 04/08/24 1704)  acetaminophen  (TYLENOL ) tablet 650 mg (650 mg Oral Given 04/08/24 1756)    Clinical Course as of 04/08/24 1941  Fri Apr 08, 2024  1608 CBC(!) CBC shows leukocytosis.  Metabolic panel shows elevated creatinine although decreased compared to previous.  Sodium level decreased [JK]  1609 Chest x-ray shows  findings concerning for pneumonia [JK]  1829 CT scan shows probably acute appendicitis with microperf.  There is also concern for possible hepatic abscess [JK]  1851 Case discussed with [facility] regarding admission.  No plans for surgical intervention at this time.  She will see patient in consultation.  Anticipate antibiotic treatment initially [JK]  1939 Case discussed with Dr. Gala regarding admission [JK]    Clinical Course User Index [JK] Randol Simmonds, MD                                 Medical Decision Making Problems Addressed: Acute appendicitis with perforation, localized peritonitis, and abscess, unspecified whether gangrene present: acute illness or injury that poses a threat to life or bodily functions AKI (acute kidney injury): acute illness or injury that poses a threat to life or bodily functions Dehydration: acute illness or injury that poses a threat to life or bodily functions  Amount and/or Complexity of Data Reviewed Labs: ordered. Decision-making details documented in ED Course. Radiology: ordered and independent interpretation performed.  Risk OTC drugs. Prescription drug management. Decision regarding hospitalization.   Patient presented to the ER with complaints of abdominal pain vomiting weakness.  Patient's primary care doctor did an x-ray that suggest possible pneumonia.  They are also concerned about appendicitis.  In the ED patient noted to have significant leukocytosis of 14.5.  Her metabolic panel did show signs of any AKI with creatinine elevated at 1.7.  Patient was treated with IV fluids.  Lactic acid was ordered although no results available at this time.  Patient was hypotensive on arrival and was treated with IV fluid boluses with improvement of her blood pressure at 134/61.  Empiric antibiotics were also ordered for pneumonia initially.  CT scan however does show findings concerning for the possibility of appendicitis with microperforation.  There is  also concern of hepatic abscess.  I will add on Flagyl.  I will consult with general surgery plan admission to hospital for further treatment     Final diagnoses:  Acute appendicitis with perforation, localized peritonitis, and abscess, unspecified whether gangrene present  AKI (acute kidney injury)  Dehydration  Hyponatremia    ED Discharge Orders     None          Randol Simmonds, MD 04/08/24 1941

## 2024-04-08 NOTE — Assessment & Plan Note (Signed)
 A1C of 6.4 in 07/2023  Continue long acting insulin   SSI and accuchecks QAC/HS

## 2024-04-08 NOTE — H&P (Signed)
 History and Physical    Patient: Kimberly [name] FMW:[id number] DOB: 1955-11-07 DOA: 04/08/2024 DOS: the patient was seen and examined on 04/08/2024 PCP: Kimberly [name] Kimberly [name]., MD  Patient coming from: Home - lives with her husband. Ambulates independently    Chief Complaint: abdominal pain and cough   HPI: [name] is a 68 y.o. female with medical history significant of T2DM,  HTN, HLD, hx of hyperthyroidism, hx of kidney transplant s/p DDKT 11/2020 who presented to ED with abdominal pain who was seen at PCP and sent to ED for further work up. She stared to have abdominal pain on her RLQ on Wednesday with nausea and vomiting. She had chills, no fevers. Pain is in her RLQ and does not radiate. Pain was sharp in nature, but pain has improved today. Pain was constant, worse at night. No change in BM. She has had no more vomiting episodes since Wednesday. She has had poor PO intake since Wednesday.   She started to have a cough on Tuesday. Cough is productive. She has some shortness of breath. She has no known sick contacts.   She saw her PCP today who was concerned about pneumonia and appendicitis and sent to ED.    Denies any fever,  vision changes/headaches, chest pain or palpitations, dysuria or leg swelling.    She does not smoke or drink alcohol .   ER Course:  vitals: afebrile, bp: 90/47, HR: 85, RR: 18, oxygen : 93%RA Pertinent labs: wbc: 14.5, sodium: 130, glucose: 234, BUN: 29, creatinine: 1.72,  CXR: increased bibasilar opacities, may reflect worsening atelectasis or pneumonia. Small right pleural effusion  CT abdomen/pelvis: Findings highly suspicious for acute appendicitis with microperforation and extraluminal gas adjacent to the right hepatic lobe. 2. Capsular-based hypoattenuating liver lesions are indeterminate on this noncontrast CT but are suspicious for hepatic abscesses, given proximity to the inflammatory process. 3. Bibasilar airspace disease with trace  right pleural effusion; although this may reflect atelectasis, infectionparticularly at the right lung baseis a concern. 4. Probable small uterine fundal fibroid. 5. Large L3-L4 eccentric right disc bulge. 6. Cardiomegaly. Aortic atherosclerosis (ICD-10 I70.0). Coronary artery atherosclerosis. In ED: general surgery consulted. Given rocephin, azithromax, flagyl. 1L IVF, BC x2. TRH asked to admit.     Review of Systems: As mentioned in the history of present illness. All other systems reviewed and are negative. Past Medical History:  Diagnosis Date   Adenomatous colon polyp 08/2007   Anemia    Diabetes mellitus type 2, controlled (HCC)    type 2   Dialysis patient 01/2017   M-W-F   End stage renal disease (HCC)     diaylis sunday tuesdays and thurs does at home   Goiter    Gout    Hyperlipidemia    Hypertension    Hyperthyroidism    Kidney disease    Kidney transplant failure    8 years ago   Osteopenia    Pneumonia    2018   Seasonal allergies    Vitamin B12 deficiency anemia due to intrinsic factor deficiency    Past Surgical History:  Procedure Laterality Date   A/V FISTULAGRAM N/A 10/03/2019   Procedure: A/V FISTULAGRAM - Left;  Surgeon: Sheree Penne Bruckner, MD;  Location: [facility] INVASIVE CV LAB;  Service: Cardiovascular;  Laterality: N/A;   AV FISTULA PLACEMENT Left 2008   colonscopy  02/2016   FISTULOGRAM Left 05/18/2019   Procedure: LEFT ARM FISTULOGRAM WITH BALLOON ANGIOPLASTY OF LEFT cephalic arch with 8mm balloon;  Surgeon: Sheree Penne Bruckner, MD;  Location: [facility] OR;  Service: Vascular;  Laterality: Left;   GANGLION CYST EXCISION Right 1994   KIDNEY TRANSPLANT  June 2010   MINOR FULGERATION OF ANAL CONDYLOMA N/A 07/31/2017   Procedure: EXCISION  FULGERATION OF ANAL CONDYLOMA;  Surgeon: Teresa Bruckner HERO, MD;  Location: [facility] ORS;  Service: General;  Laterality: N/A;   PERIPHERAL VASCULAR BALLOON ANGIOPLASTY Left 10/03/2019   Procedure: PERIPHERAL VASCULAR  BALLOON ANGIOPLASTY;  Surgeon: Sheree Penne Bruckner, MD;  Location: [facility] INVASIVE CV LAB;  Service: Cardiovascular;  Laterality: Left;  AVF   RECTAL EXAM UNDER ANESTHESIA N/A 07/03/2016   Procedure: RECTAL EXAM UNDER ANESTHESIA;  Surgeon: Camellia Blush, MD;  Location: [facility] ORS;  Service: General;  Laterality: N/A;   REVISON OF ARTERIOVENOUS FISTULA Left 05/18/2019   Procedure: revision Of  left upper arm Arteriovenous Fistula with excision of aneurysm;  Surgeon: Sheree Penne Bruckner, MD;  Location: [facility] OR;  Service: Vascular;  Laterality: Left;   SHOULDER ARTHROSCOPY W/ ROTATOR CUFF REPAIR Right yrs ago   UPPER GI ENDOSCOPY  sept   2017   WART FULGURATION N/A 07/03/2016   Procedure: EXCISION FULGURATION ANAL WART EXCISIONAL BIOSY OF EXTERNAL HEMORROID;  Surgeon: Camellia Blush, MD;  Location: [facility] ORS;  Service: General;  Laterality: N/A;   Social History:  reports that she quit smoking about 26 years ago. Her smoking use included cigarettes. She started smoking about 46 years ago. She has a 20 pack-year smoking history. She has never used smokeless tobacco. She reports current alcohol  use. She reports that she does not use drugs.  Allergies  Allergen Reactions   Sucroferric Oxyhydroxide     Other reaction(s): Flushing (Red Skin)   Other Hives    tape   Tape Hives   Phoslo  [Calcium  Acetate] Rash    Family History  Problem Relation Age of Onset   Heart disease Mother    Cancer - Other Father        gum to throat   Colon cancer Sister    Colon cancer Brother    Esophageal cancer Neg Hx    Stomach cancer Neg Hx    Rectal cancer Neg Hx     Prior to Admission medications   Medication Sig Start Date End Date Taking? Authorizing Provider  acetaminophen  (TYLENOL ) 500 MG tablet Take by mouth.    [provider]  amLODipine  (NORVASC ) 10 MG tablet Take 10 mg by mouth every evening.     [provider]  amoxicillin  (AMOXIL ) 500 MG capsule Take 2,000 mg by mouth See admin  instructions. Take 4 capsules (2000 mg) by mouth 1 hour prior to dental work 04/26/19   [provider]  azaTHIOprine  (IMURAN ) 50 MG tablet Take 1 tablet by mouth daily. 10/06/22   [provider]  ezetimibe -simvastatin  (VYTORIN ) 10-20 MG per tablet Take 1 tablet by mouth at bedtime.    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 Inject 7 units in skin in the morning and at lunchtime. Inject 5 units in the evening with meals    [provider]  labetalol  (NORMODYNE ) 200 MG tablet Take 1 tablet (200 mg total) by mouth 2 (two) times daily. Patient taking differently: Take 300 mg by mouth 2 (  two) times daily. 06/25/16   Rojelio Nest, DO  magnesium  oxide (MAG-OX) 400 MG tablet Take 2 tablets by mouth 2 (two) times daily.    [provider]  pantoprazole  (PROTONIX ) 40 MG tablet Take 40 mg by mouth daily.    [provider]  polyethylene glycol (MIRALAX / GLYCOLAX) 17 g packet Take 17 g by mouth daily as needed. 12/03/20   [provider]  predniSONE  (DELTASONE ) 5 MG tablet Take 5 mg by mouth daily.    [provider]  senna-docusate (SENOKOT-S) 8.6-50 MG tablet Take 1 tablet by mouth at bedtime as needed. 12/03/20   [provider]  sulfamethoxazole-trimethoprim (BACTRIM) 400-80 MG tablet Take 1 tablet by mouth 3 (three) times a week.    [provider]  tacrolimus  (PROGRAF ) 1 MG capsule 2 (two) times daily. 5 capsules in the morning and 4 capsules in the evening    [provider]  TOUJEO  MAX SOLOSTAR 300 UNIT/ML SOPN Inject 16 Units into the skin every evening. 03/09/19   [provider]    Physical Exam: Vitals:   04/08/24 1500 04/08/24 1739 04/08/24 1809  04/08/24 1915  BP: (!) 107/56  134/61 (!) 157/70  Pulse: 84  86 85  Resp: 16  (!) 34 (!) 29  Temp: 98.9 F (37.2 C)  98.9 F (37.2 C) 98.7 F (37.1 C)  TempSrc: Oral  Oral   SpO2: 94%  97% 96%  Weight:  66.2 kg    Height:  5' 5 (1.651 m)     General:  Appears calm and comfortable and is in NAD Eyes:  PERRL, EOMI, normal lids, iris ENT:  grossly normal hearing, lips & tongue, dry mucous membranes; appropriate dentition Neck:  no LAD, masses or thyromegaly; no carotid bruits Cardiovascular:  RRR, no m/r/g. No LE edema.  Respiratory:   decreased breath sounds in RLL. Some crackles in LLL otherwise clear. Normal effort  Abdomen:  soft TTP In RUQ, otherwise normal exam. No rebound or guarding. Normal BS  Back:   normal alignment, no CVAT Skin:  no rash or induration seen on limited exam Musculoskeletal:  grossly normal tone BUE/BLE, good ROM, no bony abnormality Lower extremity:  No LE edema.  Limited foot exam with no ulcerations.  2+ distal pulses. Psychiatric:  grossly normal mood and affect, speech fluent and appropriate, AOx3 Neurologic:  CN 2-12 grossly intact, moves all extremities in coordinated fashion, sensation intact   Radiological Exams on Admission: Independently reviewed - see discussion in A/P where applicable  CT ABDOMEN PELVIS WO CONTRAST Addendum Date: 04/08/2024  ADDENDUM #1 EXAM: CT ABDOMEN AND PELVIS WITHOUT CONTRAST 04/08/2024 05:36:04 PM TECHNIQUE: CT of the abdomen and pelvis was performed without the administration of intravenous contrast. Multiplanar reformatted images are provided for review. Automated exposure control, iterative reconstruction, and/or weight-based adjustment of the mA/kV was utilized to reduce the radiation dose to as low as reasonably achievable. COMPARISON: Ultrasound of 02/11/2016. No prior CT. CLINICAL HISTORY: lower abdominal pain, prior renal transplant. FINDINGS: LOWER CHEST: Tiny right pleural effusion. Right greater than left lung  base airspace disease. Moderate cardiomegaly. Small hiatal hernia. Coronary artery calcification. LIVER: Hepatic cysts of up to 1.8 cm. Low density capsular-based right hepatic lobe lesions of up to 2.1 x 2.5 cm (series 26, image 2). Suspect subtle gas adjacent the right hepatic capsule on image 30/2, just cephalad to the appendiceal tip. GALLBLADDER AND BILE DUCTS: Gallbladder is unremarkable. No biliary ductal dilatation. SPLEEN: No acute abnormality. PANCREAS: Mild  pancreatic atrophy. ADRENAL GLANDS: No acute abnormality. KIDNEYS, URETERS AND BLADDER: Bilateral native kidneys demonstrate moderate to marked atrophy. Upper pole right native renal artery 1.1 cm fluid density lesion is likely a cyst and does not warrant specific imaging follow-up. Lower pole left native renal artery 1.9 cm low-density lesion is also likely a cyst and does not warrant imaging follow-up. Negative left native renal artery punctate stone versus renal vascular calcification. Atrophic right iliac fossa renal transplant with presumed vascular calcifications. Left iliac fossa renal transplant without hydronephrosis or surrounding fluid collection. No bladder calculi. No hydronephrosis of the native kidneys. . Urinary bladder is unremarkable. GI AND BOWEL: Apparent gastric body wall thickening is likely due to underdistention. Periampullary duodenal diverticulum. Scattered colonic diverticula. Normal terminal ileum. The appendix originates on image 51/2 with multiple appendicoliths within including on image 49/2. The appendix is dilated at up to 1.8 cm on image 45/2 with moderate surrounding edema. There is no bowel obstruction. PERITONEUM AND RETROPERITONEUM: No ascites. No free air. VASCULATURE: Aorta is normal in caliber. Advanced aortic atherosclerosis. LYMPH NODES: No lymphadenopathy. REPRODUCTIVE ORGANS: Suspected posterior uterine fundal partially calcified 11 mm fibroid. No adnexal mass. BONES AND SOFT TISSUES: Prominent thoracolumbar  spondylosis. Eccentric right disc bulge at L3-L4 including on sagittal image 102/9. Mild pelvic floor laxity. No acute osseous abnormality. No focal soft tissue abnormality. IMPRESSION: 1. Findings highly suspicious for acute appendicitis with microperforation and extraluminal gas adjacent to the right hepatic lobe. 2. Capsular-based hypoattenuating liver lesions are indeterminate on this noncontrast CT but are suspicious for hepatic abscesses, given proximity to the inflammatory process. 3. Bibasilar airspace disease with trace right pleural effusion; although this may reflect atelectasis, infectionparticularly at the right lung baseis a concern. 4. Probable small uterine fundal fibroid. 5. Large L3-L4 eccentric right disc bulge. 6. Cardiomegaly. Aortic atherosclerosis (ICD-10 I70.0). Coronary artery atherosclerosis. Case discussed with Dr rapp at 6:45 pm. Electronically signed by: Rockey Kilts MD 04/08/2024 06:52 PM EDT RP Workstation: [id number]   Result Date: 04/08/2024 ORIGINAL REPORT EXAM: CT ABDOMEN AND PELVIS WITHOUT CONTRAST 04/08/2024 05:36:04 PM TECHNIQUE: CT of the abdomen and pelvis was performed without the administration of intravenous contrast. Multiplanar reformatted images are provided for review. Automated exposure control, iterative reconstruction, and/or weight-based adjustment of the mA/kV was utilized to reduce the radiation dose to as low as reasonably achievable. COMPARISON: Ultrasound of 02/11/2016. No prior CT. CLINICAL HISTORY: lower abdominal pain, prior renal transplant. FINDINGS: LOWER CHEST: Tiny right pleural effusion. Right greater than left lung base airspace disease. Moderate cardiomegaly. Small hiatal hernia. Coronary artery calcification. LIVER: Hepatic cysts of up to 1.8 cm. Low density capsular-based right hepatic lobe lesions of up to 2.1 x 2.5 cm (series 26, image 2). Suspect subtle gas adjacent the right hepatic capsule on image 30/2, just cephalad to the appendiceal  tip. GALLBLADDER AND BILE DUCTS: Gallbladder is unremarkable. No biliary ductal dilatation. SPLEEN: No acute abnormality. PANCREAS: Mild pancreatic atrophy. ADRENAL GLANDS: No acute abnormality. KIDNEYS, URETERS AND BLADDER: Bilateral native kidneys demonstrate moderate to marked atrophy. Upper pole right native renal artery 1.1 cm fluid density lesion is likely a cyst and does not warrant specific imaging follow-up. Lower pole left native renal artery 1.9 cm low-density lesion is also likely a cyst and does not warrant imaging follow-up. Negative left native renal artery punctate stone versus renal vascular calcification. Atrophic right iliac fossa renal transplant with presumed vascular calcifications. Left iliac fossa renal transplant without hydronephrosis or surrounding fluid collection. No bladder calculi. No hydronephrosis  of the native kidneys. . Urinary bladder is unremarkable. GI AND BOWEL: Apparent gastric body wall thickening is likely due to underdistention. Periampullary duodenal diverticulum. Scattered colonic diverticula. Normal terminal ileum. The appendix originates on image 51/2 with multiple appendicoliths within including on image 49/2. The appendix is dilated at up to 1.8 cm on image 45/2 with moderate surrounding edema. There is no bowel obstruction. PERITONEUM AND RETROPERITONEUM: No ascites. No free air. VASCULATURE: Aorta is normal in caliber. Advanced aortic atherosclerosis. LYMPH NODES: No lymphadenopathy. REPRODUCTIVE ORGANS: Suspected posterior uterine fundal partially calcified 11 mm fibroid. No adnexal mass. BONES AND SOFT TISSUES: Prominent thoracolumbar spondylosis. Eccentric right disc bulge at L3-L4 including on sagittal image 102/9. Mild pelvic floor laxity. No acute osseous abnormality. No focal soft tissue abnormality. IMPRESSION: 1. Findings highly suspicious for acute appendicitis with microperforation and extraluminal gas adjacent to the right hepatic lobe. 2. Capsular-based  hypoattenuating liver lesions are indeterminate on this noncontrast CT but are suspicious for hepatic abscesses, given proximity to the inflammatory process. 3. Bibasilar airspace disease with trace right pleural effusion; although this may reflect atelectasis, infectionparticularly at the right lung baseis a concern. 4. Probable small uterine fundal fibroid. 5. Large L3-L4 eccentric right disc bulge. 6. Cardiomegaly. Aortic atherosclerosis (ICD-10 I70.0). Coronary artery atherosclerosis. A return call from the clinical service is pending. Electronically signed by: Rockey Kilts MD 04/08/2024 06:21 PM EDT RP Workstation: [id number]   DG Chest 2 View Result Date: 04/08/2024 EXAM: 2 VIEW(S) XRAY OF THE CHEST 04/08/2024 11:53:51 AM COMPARISON: 05/05/220 CLINICAL HISTORY: ? PNA. Per chart: Pt presents after being sent from PCP for dx of pneumonia with suspicion for aspiration pneumonia. Pt also has right lower quadrant pain they felt was suspicous for appendicitis. Initially had n/v but not today. FINDINGS: LUNGS AND PLEURA: Increased bibasilar opacities are noted concerning for worsening atelectasis or pneumonia. Small right pleural effusion is noted. No pulmonary edema. No pneumothorax. HEART AND MEDIASTINUM: Stable cardiomediastinal silhouette. BONES AND SOFT TISSUES: No acute osseous abnormality. IMPRESSION: 1. Increased bibasilar opacities, which may reflect worsening atelectasis or pneumonia. 2. Small right pleural effusion. Electronically signed by: Lynwood Seip MD 04/08/2024 12:23 PM EDT RP Workstation: [id number]    EKG: Independently reviewed.  NSR with rate 81; nonspecific ST changes with no evidence of acute ischemia   Labs on Admission: I have personally reviewed the available labs and imaging studies at the time of the admission.  Pertinent labs:   wbc: 14.5, sodium: 130> corrects to 133  glucose: 234,  BUN: 29,  creatinine: 1.72,   Assessment and Plan: Principal Problem:   Acute  appendicitis Active Problems:   CAP (community acquired pneumonia)   Acute renal failure superimposed on stage 3b chronic kidney disease (HCC)   History of kidney transplant   Diabetes mellitus type 2, insulin  dependent (HCC)   Essential (primary) hypertension   Hyperlipidemia    Assessment and Plan: * Acute appendicitis 68 year old female presenting to ED with 3 day history of RLQ abdominal pain with associated nausea and vomiting found to meet sepsis criteria with leukocytosis, tachypnea likely from CT findings highly suspicous for acute appendicits with microperforation and attenuating lesions on liver suspicious for hepatic abscesses +/-CAP as well  -admit to progressive -BC obtained, initial lactic acid 2.0 -general surgery consulted  -continue rocephin and flagyl  -okay with clear liquids  -has no pain or vomiting at time of admit. PRN anti emetics/pain medication  -gentle IVF  -? IR consult  -f/u on surgery recs  CAP (community acquired pneumonia) -Hx of immunosuppression secondary to renal transplant  -denies sick contacts/other upper respiratory symptoms  -Decreased breath sounds in RLL with productive cough, shortness of breath and tachypnea and findings on CXR for possible pneumonia -continue rocephin and azithromycin  -BC pending  -check urinary strep antigens  -sputum culture -covid/flu/RSV swab  -droplet precautions for now  -IS to bedside   Acute renal failure superimposed on stage 3b chronic kidney disease (HCC) -baseline creatinine appears to be 1.3-1.4 -likely from poor PO intake + vomiting and hypotension/sepsis  -received 1L IVF in ED  -UA pending  -strict I/O -avoid nephrotoxic drugs -continue gentle IVF  -trend   History of kidney transplant s/p DDKT 11/29/2020  Continue home medication  Prograf  4 mg BID Prednisone  5 mg daily,  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  Bactrim DS MWF (monitor renal function on Monday)   Diabetes mellitus type 2,  insulin  dependent (HCC) A1C of 6.4 in 07/2023  Continue long acting insulin   SSI and accuchecks QAC/HS   Essential (primary) hypertension Initially hypotensive, but has been hypertensive since being in ED  Continue labetalol  200mg  BID Hold norvasc  and see if needs in AM    Hyperlipidemia Continue vytorin      Advance Care Planning:   Code Status: Full Code   Consults: general surgery   DVT Prophylaxis: SCDs   Family Communication: husband at bedside   Severity of Illness: The appropriate patient status for this patient is INPATIENT. Inpatient status is judged to be reasonable and necessary in order to provide the required intensity of service to ensure the patient's safety. The patient's presenting symptoms, physical exam findings, and initial radiographic and laboratory data in the context of their chronic comorbidities is felt to place them at high risk for further clinical deterioration. Furthermore, it is not anticipated that the patient will be medically stable for discharge from the hospital within 2 midnights of admission.   * I certify that at the point of admission it is my clinical judgment that the patient will require inpatient hospital care spanning beyond 2 midnights from the point of admission due to high intensity of service, high risk for further deterioration and high frequency of surveillance required.*  Author: Isaiah Geralds, MD 04/08/2024 8:55 PM  For on call review [email or web address].

## 2024-04-08 NOTE — Assessment & Plan Note (Signed)
Continue vytorin.   

## 2024-04-08 NOTE — ED Triage Notes (Addendum)
 Pt presents after being sent from PCP for dx of pneumonia with suspicion for aspiration pneumonia.  Pt also has right lower quadrant pain they felt was suspicous for appendicitis.  Initially had n/v but not today.  Pt is a kidney transplant pt

## 2024-04-08 NOTE — Assessment & Plan Note (Addendum)
 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  Bactrim DS MWF (monitor renal function on Monday)

## 2024-04-08 NOTE — ED Provider Triage Note (Signed)
 Emergency Medicine Provider Triage Evaluation Note  Kimberly [name] , a 68 y.o. female  was evaluated in triage.  Pt complains of abdominal pain, cough.  Patient reports that she was diagnosed with pneumonia by PCP earlier today based on chest x-ray with particular concern for aspiration pneumonia but she denies any vomiting or diarrhea recently.  She does endorse a slightly productive cough but no fever, chills, or bodyaches.  She does endorse some pain towards the right lower quadrant which is primary reason for having patient sending to the emergency department to rule out appendicitis.  Review of Systems  Positive: As above Negative: As above  Physical Exam  BP (!) 90/47 (BP Location: Right Arm)   Pulse 85   Temp (!) 97.5 F (36.4 C) (Oral)   Resp 18   Ht 5' 5 (1.651 m)   Wt 70.3 kg   SpO2 93%   BMI 25.79 kg/m  Gen:   Awake, no distress   Resp:  Normal effort  MSK:   Moves extremities without difficulty  Other:  Crackles were clearly audible to the right and left lower lung fields.  Tenderness to palpation in the lower right lower quadrant with no CVA tenderness present.  Medical Decision Making  Medically screening exam initiated at 12:10 PM.  Appropriate orders placed.  Asma Boldon was informed that the remainder of the evaluation will be completed by another provider, this initial triage assessment does not replace that evaluation, and the importance of remaining in the ED until their evaluation is complete.     Shloima Clinch A, PA-C 04/08/24 1212

## 2024-04-08 NOTE — Assessment & Plan Note (Addendum)
-  baseline creatinine appears to be 1.3-1.4 -likely from poor PO intake + vomiting and hypotension/sepsis  -received 1L IVF in ED  -UA pending  -strict I/O -avoid nephrotoxic drugs -continue gentle IVF  -trend

## 2024-04-08 NOTE — ED Notes (Signed)
 Pt unable to give urine sample at this time

## 2024-04-08 NOTE — ED Notes (Addendum)
 Pt ambulated to restroom, pt missed hat will try to obtain another urine sample later

## 2024-04-08 NOTE — Assessment & Plan Note (Addendum)
 68 year old female presenting to ED with 3 day history of RLQ abdominal pain with associated nausea and vomiting found to meet sepsis criteria with leukocytosis, tachypnea likely from CT findings highly suspicous for acute appendicits with microperforation and attenuating lesions on liver suspicious for hepatic abscesses +/-CAP as well  -admit to progressive -BC obtained, initial lactic acid 2.0 -general surgery consulted  -continue rocephin and flagyl  -okay with clear liquids  -has no pain or vomiting at time of admit. PRN anti emetics/pain medication  -gentle IVF  -? IR consult  -f/u on surgery recs

## 2024-04-09 DIAGNOSIS — Z940 Kidney transplant status: Secondary | ICD-10-CM

## 2024-04-09 DIAGNOSIS — Z794 Long term (current) use of insulin: Secondary | ICD-10-CM

## 2024-04-09 DIAGNOSIS — E785 Hyperlipidemia, unspecified: Secondary | ICD-10-CM

## 2024-04-09 DIAGNOSIS — N179 Acute kidney failure, unspecified: Secondary | ICD-10-CM

## 2024-04-09 DIAGNOSIS — N1832 Chronic kidney disease, stage 3b: Secondary | ICD-10-CM

## 2024-04-09 DIAGNOSIS — K353 Acute appendicitis with localized peritonitis, without perforation or gangrene: Secondary | ICD-10-CM

## 2024-04-09 DIAGNOSIS — J189 Pneumonia, unspecified organism: Secondary | ICD-10-CM | POA: Diagnosis not present

## 2024-04-09 DIAGNOSIS — E119 Type 2 diabetes mellitus without complications: Secondary | ICD-10-CM

## 2024-04-09 DIAGNOSIS — I10 Essential (primary) hypertension: Secondary | ICD-10-CM

## 2024-04-09 LAB — URINALYSIS, W/ REFLEX TO CULTURE (INFECTION SUSPECTED)
Bacteria, UA: NONE SEEN
Bilirubin Urine: NEGATIVE
Glucose, UA: NEGATIVE mg/dL
Hgb urine dipstick: NEGATIVE
Ketones, ur: NEGATIVE mg/dL
Nitrite: NEGATIVE
Protein, ur: NEGATIVE mg/dL
Specific Gravity, Urine: 1.017 (ref 1.005–1.030)
pH: 5 (ref 5.0–8.0)

## 2024-04-09 LAB — COMPREHENSIVE METABOLIC PANEL WITH GFR
ALT: 14 U/L (ref 0–44)
AST: 23 U/L (ref 15–41)
Albumin: 3.2 g/dL — ABNORMAL LOW (ref 3.5–5.0)
Alkaline Phosphatase: 99 U/L (ref 38–126)
Anion gap: 13 (ref 5–15)
BUN: 38 mg/dL — ABNORMAL HIGH (ref 8–23)
CO2: 20 mmol/L — ABNORMAL LOW (ref 22–32)
Calcium: 9.6 mg/dL (ref 8.9–10.3)
Chloride: 97 mmol/L — ABNORMAL LOW (ref 98–111)
Creatinine, Ser: 1.59 mg/dL — ABNORMAL HIGH (ref 0.44–1.00)
GFR, Estimated: 35 mL/min — ABNORMAL LOW (ref >60)
Glucose, Bld: 162 mg/dL — ABNORMAL HIGH (ref 70–99)
Potassium: 3.4 mmol/L — ABNORMAL LOW (ref 3.5–5.1)
Sodium: 130 mmol/L — ABNORMAL LOW (ref 135–145)
Total Bilirubin: 1 mg/dL (ref 0.0–1.2)
Total Protein: 5.6 g/dL — ABNORMAL LOW (ref 6.5–8.1)

## 2024-04-09 LAB — RESP PANEL BY RT-PCR (RSV, FLU A&B, COVID)  RVPGX2
Influenza A by PCR: NEGATIVE
Influenza B by PCR: NEGATIVE
Resp Syncytial Virus by PCR: NEGATIVE
SARS Coronavirus 2 by RT PCR: NEGATIVE

## 2024-04-09 LAB — CBC
HCT: 37.1 % (ref 36.0–46.0)
Hemoglobin: 12.4 g/dL (ref 12.0–15.0)
MCH: 30.5 pg (ref 26.0–34.0)
MCHC: 33.4 g/dL (ref 30.0–36.0)
MCV: 91.2 fL (ref 80.0–100.0)
Platelets: 125 K/uL — ABNORMAL LOW (ref 150–400)
RBC: 4.07 MIL/uL (ref 3.87–5.11)
RDW: 13.1 % (ref 11.5–15.5)
WBC: 14.5 K/uL — ABNORMAL HIGH (ref 4.0–10.5)
nRBC: 0 % (ref 0.0–0.2)

## 2024-04-09 LAB — PROCALCITONIN: Procalcitonin: 32.6 ng/mL

## 2024-04-09 LAB — STREP PNEUMONIAE URINARY ANTIGEN: Strep Pneumo Urinary Antigen: NEGATIVE

## 2024-04-09 LAB — GLUCOSE, CAPILLARY
Glucose-Capillary: 142 mg/dL — ABNORMAL HIGH (ref 70–99)
Glucose-Capillary: 166 mg/dL — ABNORMAL HIGH (ref 70–99)
Glucose-Capillary: 229 mg/dL — ABNORMAL HIGH (ref 70–99)
Glucose-Capillary: 213 mg/dL — ABNORMAL HIGH (ref 70–99)
Glucose-Capillary: 217 mg/dL — ABNORMAL HIGH (ref 70–99)

## 2024-04-09 LAB — CBG MONITORING, ED: Glucose-Capillary: 152 mg/dL — ABNORMAL HIGH (ref 70–99)

## 2024-04-09 NOTE — Progress Notes (Signed)
 PROGRESS NOTE    Kimberly [name]  FMW:[id number] DOB: 07-15-1955 DOA: 04/08/2024 PCP: Kimberly [name]., MD   Brief Narrative:  Kimberly [name] is a 68 y.o. female with medical history significant of T2DM,  HTN, HLD, hx of hyperthyroidism, hx of kidney transplant s/p DDKT 11/2020 who presented to ED with abdominal pain -imaging concerning for appendicitis w/ microperforation -hospitalist called for admission, general surgery, consult.   Assessment & Plan:   Principal Problem:   Acute appendicitis Active Problems:   CAP (community acquired pneumonia)   Acute renal failure superimposed on stage 3b chronic kidney disease (HCC)   History of kidney transplant   Diabetes mellitus type 2, insulin  dependent (HCC)   Essential (primary) hypertension   Hyperlipidemia  Severe sepsis with lactic acidosis, multifactorial, POA - Appendicitis, liver abscess, pneumonia as below  Acute appendicitis with microperforation Liver abscess versus cyst, POA -Criteria met with leukocytosis, tachypnea notable source with elevated lactic acid/AKI -Pneumonia unlikely given lack of symptoms but will continue treatment as below for coverage -Continue azithromycin, ceftriaxone, Flagyl, (on Bactrim three times per week at baseline) -Pending cultures will likely involve infectious disease versus IR, unclear if there are options for drainage or fluid analysis -General Surgery following, appreciate insight recommendations, plan for repeat CT in a few days   Pneumonia, community-acquired, POA -Hx of immunosuppression secondary to renal transplant  -No symptoms or complaints at this time, continue to follow, broad-spectrum antibiotics as above   Acute renal failure superimposed on stage 3b chronic kidney disease (HCC) - Baseline creatinine 1.4 - Likely prerenal exacerbated by poor p.o. intake nausea vomiting hypotension and sepsis - Continue IV fluids as appropriate, advance diet as tolerated    History of kidney transplant s/p DDKT 11/29/2020  Continue Prograf , prednisone , Imuran , Bactrim(three times per week)   Diabetes mellitus type 2, insulin  dependent (HCC) -Uncontrolled with hyperglycemia -Continue long acting insulin   -Continue sliding scale insulin , hypoglycemic protocol   Essential (primary) hypertension Medications held given initially hypotensive, as blood pressure improves restart home medications, currently on labetalol , holding home amlodipine   Hyperlipidemia Continue simvastatin , Zetia    DVT prophylaxis: SCDs Start: 04/08/24 2040 Code Status:   Code Status: Full Code Family Communication: Husband at bedside  Status is: Inpatient  Dispo: The patient is from: Home              Anticipated d/c is to: Home              Anticipated d/c date is: 72+ hours              Patient currently not medically stable for discharge  Consultants:  General surgery  Procedures:  None planned  Antimicrobials:  Azithromycin, ceftriaxone, Flagyl - Chronically on Bactrim 3 times weekly  Subjective: No acute issues or events overnight denies nausea vomiting diarrhea constipation headache fevers chills or chest pain.  Abdominal discomfort ongoing but markedly improved on current regimen  Objective: Vitals:   04/09/24 0244 04/09/24 0437 04/09/24 0438 04/09/24 0727  BP: 139/69  134/66 (!) 149/60  Pulse: 89  90 95  Resp: (!) 25  18 19   Temp: 99.7 F (37.6 C)   98.7 F (37.1 C)  TempSrc: Oral     SpO2: 95%  93% 93%  Weight:  69.2 kg    Height:  5' 5 (1.651 m)      Intake/Output Summary (Last 24 hours) at 04/09/2024 0745 Last data filed at 04/09/2024 0511 Gross per 24 hour  Intake 2025.18 ml  Output --  Net 2025.18 ml   Filed Weights   04/08/24 1105 04/08/24 1739 04/09/24 0437  Weight: 70.3 kg 66.2 kg 69.2 kg    Examination:  General:  Pleasantly resting in bed, No acute distress. HEENT:  Normocephalic atraumatic.  Sclerae nonicteric, noninjected.   Extraocular movements intact bilaterally. Neck:  Without mass or deformity.  Trachea is midline. Lungs:  Clear to auscultate bilaterally without rhonchi, wheeze, or rales. Heart:  Regular rate and rhythm.  Without murmurs, rubs, or gallops. Abdomen:  Soft, moderately tender, PMI at right lower quadrant without guarding or rebound. Extremities: Without cyanosis, clubbing, edema, or obvious deformity. Skin:  Warm and dry, no erythema.   Data Reviewed: I have personally reviewed following labs and imaging studies  CBC: Recent Labs  Lab 04/08/24 1116  WBC 14.5*  HGB 12.8  HCT 39.6  MCV 93.0  PLT 135*   Basic Metabolic Panel: Recent Labs  Lab 04/08/24 1116  NA 130*  K 3.9  CL 95*  CO2 21*  GLUCOSE 234*  BUN 29*  CREATININE 1.72*  CALCIUM  9.7   GFR: Estimated Creatinine Clearance: 30.2 mL/min (A) (by C-G formula based on SCr of 1.72 mg/dL (H)). Liver Function Tests: Recent Labs  Lab 04/08/24 1116  AST 19  ALT 14  ALKPHOS 87  BILITOT 1.4*  PROT 5.8*  ALBUMIN 3.6   Recent Labs  Lab 04/08/24 1116  LIPASE 15   No results for input(s): AMMONIA in the last 168 hours. Coagulation Profile: Recent Labs  Lab 04/08/24 1532  INR 1.2   Cardiac Enzymes: No results for input(s): CKTOTAL, CKMB, CKMBINDEX, TROPONINI in the last 168 hours. BNP (last 3 results) No results for input(s): PROBNP in the last 8760 hours. HbA1C: No results for input(s): HGBA1C in the last 72 hours. CBG: Recent Labs  Lab 04/09/24 0052 04/09/24 0446 04/09/24 0722  GLUCAP 152* 142* 166*   Lipid Profile: No results for input(s): CHOL, HDL, LDLCALC, TRIG, CHOLHDL, LDLDIRECT in the last 72 hours. Thyroid  Function Tests: No results for input(s): TSH, T4TOTAL, FREET4, T3FREE, THYROIDAB in the last 72 hours. Anemia Panel: No results for input(s): VITAMINB12, FOLATE, FERRITIN, TIBC, IRON, RETICCTPCT in the last 72 hours. Sepsis Labs: Recent Labs   Lab 04/08/24 2009 04/08/24 2016  PROCALCITON  --  38.90  LATICACIDVEN 2.0*  --     Recent Results (from the past 240 hours)  Resp panel by RT-PCR (RSV, Flu A&B, Covid) Anterior Nasal Swab     Status: None   Collection Time: 04/09/24  1:20 AM   Specimen: Anterior Nasal Swab  Result Value Ref Range Status   SARS Coronavirus 2 by RT PCR NEGATIVE NEGATIVE Final    Comment: (NOTE) SARS-CoV-2 target nucleic acids are NOT DETECTED.  The SARS-CoV-2 RNA is generally detectable in upper respiratory specimens during the acute phase of infection. The lowest concentration of SARS-CoV-2 viral copies this assay can detect is 138 copies/mL. A negative result does not preclude SARS-Cov-2 infection and should not be used as the sole basis for treatment or other patient management decisions. A negative result may occur with  improper specimen collection/handling, submission of specimen other than nasopharyngeal swab, presence of viral mutation(s) within the areas targeted by this assay, and inadequate number of viral copies(<138 copies/mL). A negative result must be combined with clinical observations, patient history, and epidemiological information. The expected result is Negative.  Fact Sheet for Patients:  BloggerCourse.com  Fact Sheet for Healthcare Providers:  SeriousBroker.it  This test is  no t yet approved or cleared by the [place]  FDA and  has been authorized for detection and/or diagnosis of SARS-CoV-2 by FDA under an Emergency Use Authorization (EUA). This EUA will remain  in effect (meaning this test can be used) for the duration of the COVID-19 declaration under Section 564(b)(1) of the Act, 21 U.S.C.section 360bbb-3(b)(1), unless the authorization is terminated  or revoked sooner.       Influenza A by PCR NEGATIVE NEGATIVE Final   Influenza B by PCR NEGATIVE NEGATIVE Final    Comment: (NOTE) The Xpert Xpress  SARS-CoV-2/FLU/RSV plus assay is intended as an aid in the diagnosis of influenza from Nasopharyngeal swab specimens and should not be used as a sole basis for treatment. Nasal washings and aspirates are unacceptable for Xpert Xpress SARS-CoV-2/FLU/RSV testing.  Fact Sheet for Patients: BloggerCourse.com  Fact Sheet for Healthcare Providers: SeriousBroker.it  This test is not yet approved or cleared by the [place]  FDA and has been authorized for detection and/or diagnosis of SARS-CoV-2 by FDA under an Emergency Use Authorization (EUA). This EUA will remain in effect (meaning this test can be used) for the duration of the COVID-19 declaration under Section 564(b)(1) of the Act, 21 U.S.C. section 360bbb-3(b)(1), unless the authorization is terminated or revoked.     Resp Syncytial Virus by PCR NEGATIVE NEGATIVE Final    Comment: (NOTE) Fact Sheet for Patients: BloggerCourse.com  Fact Sheet for Healthcare Providers: SeriousBroker.it  This test is not yet approved or cleared by the [place]  FDA and has been authorized for detection and/or diagnosis of SARS-CoV-2 by FDA under an Emergency Use Authorization (EUA). This EUA will remain in effect (meaning this test can be used) for the duration of the COVID-19 declaration under Section 564(b)(1) of the Act, 21 U.S.C. section 360bbb-3(b)(1), unless the authorization is terminated or revoked.  Performed at [facility], [address] [place], [place] [address]          Radiology Studies:  ADDENDUM #1  EXAM: CT ABDOMEN AND PELVIS WITHOUT CONTRAST 04/08/2024 05:36:04 PM TECHNIQUE: CT of the abdomen and pelvis was performed without the administration of intravenous contrast. Multiplanar reformatted images are provided for review. Automated exposure control, iterative reconstruction, and/or weight-based  adjustment of the mA/kV was utilized to reduce the radiation dose to as low as reasonably achievable. COMPARISON: Ultrasound of 02/11/2016. No prior CT. CLINICAL HISTORY: lower abdominal pain, prior renal transplant. FINDINGS: LOWER CHEST: Tiny right pleural effusion. Right greater than left lung base airspace disease. Moderate cardiomegaly. Small hiatal hernia. Coronary artery calcification. LIVER: Hepatic cysts of up to 1.8 cm. Low density capsular-based right hepatic lobe lesions of up to 2.1 x 2.5 cm (series 26, image 2). Suspect subtle gas adjacent the right hepatic capsule on image 30/2, just cephalad to the appendiceal tip. GALLBLADDER AND BILE DUCTS: Gallbladder is unremarkable. No biliary ductal dilatation. SPLEEN: No acute abnormality. PANCREAS: Mild pancreatic atrophy. ADRENAL GLANDS: No acute abnormality. KIDNEYS, URETERS AND BLADDER: Bilateral native kidneys demonstrate moderate to marked atrophy. Upper pole right native renal artery 1.1 cm fluid density lesion is likely a cyst and does not warrant specific imaging follow-up. Lower pole left native renal artery 1.9 cm low-density lesion is also likely a cyst and does not warrant imaging follow-up. Negative left native renal artery punctate stone versus renal vascular calcification. Atrophic right iliac fossa renal transplant with presumed vascular calcifications. Left iliac fossa renal transplant without hydronephrosis or surrounding fluid collection. No bladder calculi. No hydronephrosis of  the native kidneys. . Urinary bladder is unremarkable. GI AND BOWEL: Apparent gastric body wall thickening is likely due to underdistention. Periampullary duodenal diverticulum. Scattered colonic diverticula. Normal terminal ileum. The appendix originates on image 51/2 with multiple appendicoliths within including on image 49/2. The appendix is dilated at up to 1.8 cm on image 45/2 with moderate surrounding edema. There is no bowel obstruction. PERITONEUM AND  RETROPERITONEUM: No ascites. No free air. VASCULATURE: Aorta is normal in caliber. Advanced aortic atherosclerosis. LYMPH NODES: No lymphadenopathy. REPRODUCTIVE ORGANS: Suspected posterior uterine fundal partially calcified 11 mm fibroid. No adnexal mass. BONES AND SOFT TISSUES: Prominent thoracolumbar spondylosis. Eccentric right disc bulge at L3-L4 including on sagittal image 102/9. Mild pelvic floor laxity. No acute osseous abnormality. No focal soft tissue abnormality. IMPRESSION: 1. Findings highly suspicious for acute appendicitis with microperforation and extraluminal gas adjacent to the right hepatic lobe. 2. Capsular-based hypoattenuating liver lesions are indeterminate on this noncontrast CT but are suspicious for hepatic abscesses, given proximity to the inflammatory process. 3. Bibasilar airspace disease with trace right pleural effusion; although this may reflect atelectasis, infectionparticularly at the right lung baseis a concern. 4. Probable small uterine fundal fibroid. 5. Large L3-L4 eccentric right disc bulge. 6. Cardiomegaly. Aortic atherosclerosis (ICD-10 I70.0). Coronary artery atherosclerosis. Case discussed with Dr rapp at 6:45 pm. Electronically signed by: Rockey Kilts MD 04/08/2024 06:52 PM EDT RP Workstation: [id number]   Result Date: 04/08/2024  ORIGINAL REPORT  EXAM: CT ABDOMEN AND PELVIS WITHOUT CONTRAST 04/08/2024 05:36:04 PM TECHNIQUE: CT of the abdomen and pelvis was performed without the administration of intravenous contrast. Multiplanar reformatted images are provided for review. Automated exposure control, iterative reconstruction, and/or weight-based adjustment of the mA/kV was utilized to reduce the radiation dose to as low as reasonably achievable. COMPARISON: Ultrasound of 02/11/2016. No prior CT. CLINICAL HISTORY: lower abdominal pain, prior renal transplant. FINDINGS: LOWER CHEST: Tiny right pleural effusion. Right greater than left lung base airspace disease. Moderate  cardiomegaly. Small hiatal hernia. Coronary artery calcification. LIVER: Hepatic cysts of up to 1.8 cm. Low density capsular-based right hepatic lobe lesions of up to 2.1 x 2.5 cm (series 26, image 2). Suspect subtle gas adjacent the right hepatic capsule on image 30/2, just cephalad to the appendiceal tip. GALLBLADDER AND BILE DUCTS: Gallbladder is unremarkable. No biliary ductal dilatation. SPLEEN: No acute abnormality. PANCREAS: Mild pancreatic atrophy. ADRENAL GLANDS: No acute abnormality. KIDNEYS, URETERS AND BLADDER: Bilateral native kidneys demonstrate moderate to marked atrophy. Upper pole right native renal artery 1.1 cm fluid density lesion is likely a cyst and does not warrant specific imaging follow-up. Lower pole left native renal artery 1.9 cm low-density lesion is also likely a cyst and does not warrant imaging follow-up. Negative left native renal artery punctate stone versus renal vascular calcification. Atrophic right iliac fossa renal transplant with presumed vascular calcifications. Left iliac fossa renal transplant without hydronephrosis or surrounding fluid collection. No bladder calculi. No hydronephrosis of the native kidneys. . Urinary bladder is unremarkable. GI AND BOWEL: Apparent gastric body wall thickening is likely due to underdistention. Periampullary duodenal diverticulum. Scattered colonic diverticula. Normal terminal ileum. The appendix originates on image 51/2 with multiple appendicoliths within including on image 49/2. The appendix is dilated at up to 1.8 cm on image 45/2 with moderate surrounding edema. There is no bowel obstruction. PERITONEUM AND RETROPERITONEUM: No ascites. No free air. VASCULATURE: Aorta is normal in caliber. Advanced aortic atherosclerosis. LYMPH NODES: No lymphadenopathy. REPRODUCTIVE ORGANS: Suspected posterior uterine fundal partially calcified 11 mm fibroid.  No adnexal mass. BONES AND SOFT TISSUES: Prominent thoracolumbar spondylosis. Eccentric right  disc bulge at L3-L4 including on sagittal image 102/9. Mild pelvic floor laxity. No acute osseous abnormality. No focal soft tissue abnormality. IMPRESSION: 1. Findings highly suspicious for acute appendicitis with microperforation and extraluminal gas adjacent to the right hepatic lobe. 2. Capsular-based hypoattenuating liver lesions are indeterminate on this noncontrast CT but are suspicious for hepatic abscesses, given proximity to the inflammatory process. 3. Bibasilar airspace disease with trace right pleural effusion; although this may reflect atelectasis, infectionparticularly at the right lung baseis a concern. 4. Probable small uterine fundal fibroid. 5. Large L3-L4 eccentric right disc bulge. 6. Cardiomegaly. Aortic atherosclerosis (ICD-10 I70.0). Coronary artery atherosclerosis. A return call from the clinical service is pending. Electronically signed by: Rockey Kilts MD 04/08/2024 06:21 PM EDT RP Workstation: [id number]   DG Chest 2 View Result Date: 04/08/2024 EXAM: 2 VIEW(S) XRAY OF THE CHEST 04/08/2024 11:53:51 AM COMPARISON: 05/05/220 CLINICAL HISTORY: ? PNA. Per chart: Pt presents after being sent from PCP for dx of pneumonia with suspicion for aspiration pneumonia. Pt also has right lower quadrant pain they felt was suspicous for appendicitis. Initially had n/v but not today. FINDINGS: LUNGS AND PLEURA: Increased bibasilar opacities are noted concerning for worsening atelectasis or pneumonia. Small right pleural effusion is noted. No pulmonary edema. No pneumothorax. HEART AND MEDIASTINUM: Stable cardiomediastinal silhouette. BONES AND SOFT TISSUES: No acute osseous abnormality. IMPRESSION: 1. Increased bibasilar opacities, which may reflect worsening atelectasis or pneumonia. 2. Small right pleural effusion. Electronically signed by: Lynwood Seip MD 04/08/2024 12:23 PM EDT RP Workstation: [id number]      Scheduled Meds:  azaTHIOprine   25 mg Oral Daily   ezetimibe   10 mg Oral QHS    insulin  aspart  0-9 Units Subcutaneous TID WC   insulin  glargine-yfgn  16 Units Subcutaneous QPM   labetalol   200 mg Oral BID   magnesium  oxide  400 mg Oral BID   pantoprazole  (PROTONIX ) IV  40 mg Intravenous Q24H   predniSONE   5 mg Oral Q breakfast   simvastatin   20 mg Oral QHS   [START ON 04/11/2024] sulfamethoxazole-trimethoprim  1 tablet Oral Once per day on Monday Wednesday Friday   tacrolimus   4 mg Oral BID   Continuous Infusions:  sodium chloride  75 mL/hr at 04/08/24 2101   azithromycin     cefTRIAXone (ROCEPHIN)  IV     metronidazole 500 mg (04/09/24 0511)     LOS: 1 day    Time spent:  Elsie JAYSON Mont[place], DO [facility]  If 7PM-7AM, please contact night-coverage [email or web address]  04/09/2024, 7:45 AM

## 2024-04-09 NOTE — Progress Notes (Signed)
   Subjective/Chief Complaint: Feels better, having bowel function   Objective: Vital signs in last 24 hours: Temp:  [97.5 F (36.4 C)-99.7 F (37.6 C)] 98.7 F (37.1 C) (10/18 0727) Pulse Rate:  [83-95] 91 (10/18 0900) Resp:  [16-34] 19 (10/18 0727) BP: (90-157)/(47-70) 139/54 (10/18 0900) SpO2:  [93 %-97 %] 93 % (10/18 0727) Weight:  [66.2 kg-70.3 kg] 69.2 kg (10/18 0437) Last BM Date : 04/09/24  Intake/Output from previous day: 10/17 0701 - 10/18 0700 In: 2025.2 [I.V.:581.2; IV Piggyback:1444] Out: -  Intake/Output this shift: No intake/output data recorded.  Ab soft mild tender right side, no peritoneal signs  Lab Results:  Recent Labs    04/08/24 1116 04/09/24 0712  WBC 14.5* 14.5*  HGB 12.8 12.4  HCT 39.6 37.1  PLT 135* 125*   BMET Recent Labs    04/08/24 1116 04/09/24 0712  NA 130* 130*  K 3.9 3.4*  CL 95* 97*  CO2 21* 20*  GLUCOSE 234* 162*  BUN 29* 38*  CREATININE 1.72* 1.59*  CALCIUM  9.7 9.6   PT/INR Recent Labs    04/08/24 1532  LABPROT 15.8*  INR 1.2     Anti-infectives: Anti-infectives (From admission, onward)    Start     Dose/Rate Route Frequency Ordered Stop   04/11/24 0900  sulfamethoxazole-trimethoprim (BACTRIM) 400-80 MG per tablet 1 tablet        1 tablet Oral Once per day on Monday Wednesday Friday 04/08/24 2052     04/09/24 1700  azithromycin (ZITHROMAX) 500 mg in sodium chloride  0.9 % 250 mL IVPB        500 mg 250 mL/hr over 60 Minutes Intravenous Every 24 hours 04/08/24 2037     04/09/24 0800  cefTRIAXone (ROCEPHIN) 2 g in sodium chloride  0.9 % 100 mL IVPB        2 g 200 mL/hr over 30 Minutes Intravenous Every 24 hours 04/08/24 1932     04/09/24 0600  metroNIDAZOLE (FLAGYL) IVPB 500 mg        500 mg 100 mL/hr over 60 Minutes Intravenous Every 12 hours 04/08/24 2039     04/08/24 1845  metroNIDAZOLE (FLAGYL) IVPB 500 mg        500 mg 100 mL/hr over 60 Minutes Intravenous  Once 04/08/24 1836 04/09/24 0028   04/08/24  1530  cefTRIAXone (ROCEPHIN) 1 g in sodium chloride  0.9 % 100 mL IVPB        1 g 200 mL/hr over 30 Minutes Intravenous  Once 04/08/24 1525 04/08/24 1700   04/08/24 1530  azithromycin (ZITHROMAX) 500 mg in sodium chloride  0.9 % 250 mL IVPB        500 mg 250 mL/hr over 60 Minutes Intravenous  Once 04/08/24 1525 04/09/24 0027       Assessment/Plan: Perforated appendicitis -seen overnight, ? Hepatic abscesses vs cysts.  She does have at least one prior hepatic cyst on a ruq u/s.  Time course only 2 days but location and immunosuppression can mask symptoms.  -reasonable to continue abx for now but have low threshold if not better to take to OR -PNA also for which she is on abx -continue hydration -will re-evaluate in am to determine continued conservative therapy and timing of repeat imaging vs surgery -pharm dvt prophylaxis fine   Kimberly [name] 04/09/2024

## 2024-04-09 NOTE — Plan of Care (Signed)
  Problem: Education: Goal: Ability to describe self-care measures that may prevent or decrease complications (Diabetes Survival Skills Education) will improve Outcome: Progressing   Problem: Coping: Goal: Ability to adjust to condition or change in health will improve Outcome: Progressing   Problem: Coping: Goal: Level of anxiety will decrease Outcome: Progressing   Problem: Pain Managment: Goal: General experience of comfort will improve and/or be controlled Outcome: Progressing

## 2024-04-10 ENCOUNTER — Inpatient Hospital Stay (HOSPITAL_COMMUNITY)

## 2024-04-10 DIAGNOSIS — K353 Acute appendicitis with localized peritonitis, without perforation or gangrene: Secondary | ICD-10-CM | POA: Diagnosis not present

## 2024-04-10 DIAGNOSIS — Z940 Kidney transplant status: Secondary | ICD-10-CM | POA: Diagnosis not present

## 2024-04-10 DIAGNOSIS — J189 Pneumonia, unspecified organism: Secondary | ICD-10-CM | POA: Diagnosis not present

## 2024-04-10 DIAGNOSIS — N179 Acute kidney failure, unspecified: Secondary | ICD-10-CM | POA: Diagnosis not present

## 2024-04-10 LAB — GLUCOSE, CAPILLARY
Glucose-Capillary: 172 mg/dL — ABNORMAL HIGH (ref 70–99)
Glucose-Capillary: 211 mg/dL — ABNORMAL HIGH (ref 70–99)
Glucose-Capillary: 169 mg/dL — ABNORMAL HIGH (ref 70–99)
Glucose-Capillary: 154 mg/dL — ABNORMAL HIGH (ref 70–99)

## 2024-04-10 LAB — CBC
HCT: 36.8 % (ref 36.0–46.0)
Hemoglobin: 12.2 g/dL (ref 12.0–15.0)
MCH: 30.8 pg (ref 26.0–34.0)
MCHC: 33.2 g/dL (ref 30.0–36.0)
MCV: 92.9 fL (ref 80.0–100.0)
Platelets: 126 K/uL — ABNORMAL LOW (ref 150–400)
RBC: 3.96 MIL/uL (ref 3.87–5.11)
RDW: 13 % (ref 11.5–15.5)
WBC: 12.8 K/uL — ABNORMAL HIGH (ref 4.0–10.5)
nRBC: 0 % (ref 0.0–0.2)

## 2024-04-10 MED ORDER — IOHEXOL 9 MG/ML PO SOLN
ORAL | Status: AC
Start: 2024-04-10 — End: 2024-04-10
  Filled 2024-04-10: qty 1000

## 2024-04-10 MED ORDER — BOOST / RESOURCE BREEZE PO LIQD CUSTOM
1.0000 | Freq: Three times a day (TID) | ORAL | Status: DC
  Administered 2024-04-12: 1 via ORAL

## 2024-04-10 NOTE — Progress Notes (Signed)
 PROGRESS NOTE    Kimberly [name]  FMW:[id number] DOB: 1955/07/10 DOA: 04/08/2024 PCP: Loreli Elsie [name], MD   Brief Narrative:  Kimberly [name] is a 68 y.o. female with medical history significant of T2DM,  HTN, HLD, hx of hyperthyroidism, hx of kidney transplant s/p DDKT 11/2020 who presented to ED with abdominal pain -imaging concerning for appendicitis w/ microperforation -hospitalist called for admission, general surgery, consult.   Assessment & Plan:   Principal Problem:   Acute appendicitis Active Problems:   CAP (community acquired pneumonia)   Acute renal failure superimposed on stage 3b chronic kidney disease (HCC)   History of kidney transplant   Diabetes mellitus type 2, insulin  dependent (HCC)   Essential (primary) hypertension   Hyperlipidemia  Severe sepsis with lactic acidosis, multifactorial, POA - Appendicitis, liver abscess, pneumonia as below  Acute appendicitis with microperforation Liver abscess versus cyst, POA -Criteria met with leukocytosis, tachypnea notable source with elevated lactic acid/AKI -Pneumonia unlikely given lack of symptoms but will continue treatment as below for coverage -Continue azithromycin, ceftriaxone, Flagyl, (on Bactrim three times per week at baseline) -Pending cultures will likely involve infectious disease versus IR, unclear if there are options for drainage or fluid analysis -General Surgery following, appreciate insight recommendations - Repeat imaging notable for persistent findings of acute appendicitis with enlarging subcapsular fluid collection inferior to the liver. Interventional radiology asked to evaluate this fluid for possible drain/sample/culture.   Pneumonia, community-acquired, POA -Hx of immunosuppression secondary to renal transplant  -No symptoms or complaints at this time, continue to follow, broad-spectrum antibiotics as above   Acute renal failure superimposed on stage 3b chronic kidney disease  (HCC) - Baseline creatinine 1.4 - Likely prerenal exacerbated by poor p.o. intake nausea vomiting hypotension and sepsis - Continue IV fluids as appropriate, advance diet as tolerated   History of kidney transplant s/p DDKT 11/29/2020  Continue Prograf , prednisone , Imuran , Bactrim(three times per week)   Diabetes mellitus type 2, insulin  dependent (HCC) -Uncontrolled with hyperglycemia -Continue long acting insulin   -Continue sliding scale insulin , hypoglycemic protocol   Essential (primary) hypertension Medications held given initially hypotensive, as blood pressure improves restart home medications, currently on labetalol , holding home amlodipine   Hyperlipidemia Continue simvastatin , Zetia    DVT prophylaxis: SCDs Start: 04/08/24 2040 Code Status:   Code Status: Full Code Family Communication: Husband at bedside  Status is: Inpatient  Dispo: The patient is from: Home              Anticipated d/c is to: Home              Anticipated d/c date is: 69+ hours              Patient currently not medically stable for discharge  Consultants:  General surgery  Procedures:  None planned  Antimicrobials:  Azithromycin, ceftriaxone, Flagyl - Chronically on Bactrim 3 times weekly  Subjective: No acute issues or events overnight denies nausea vomiting diarrhea constipation headache fevers chills or chest pain.  Abdominal discomfort ongoing but markedly improved on current regimen  Objective: Vitals:   04/09/24 1142 04/09/24 1609 04/09/24 2113 04/10/24 0358  BP: (!) 111/52 132/61 134/65 (!) 142/61  Pulse: 74 84 81 90  Resp: 20 19 17 20   Temp: 98.2 F (36.8 C) (!) 97.5 F (36.4 C) 98.5 F (36.9 C) 98.5 F (36.9 C)  TempSrc: Oral  Oral Oral  SpO2: (!) 89% 90% 91% 92%  Weight:      Height:  Intake/Output Summary (Last 24 hours) at 04/10/2024 0822 Last data filed at 04/10/2024 0500 Gross per 24 hour  Intake 760 ml  Output 200 ml  Net 560 ml   Filed Weights    04/08/24 1105 04/08/24 1739 04/09/24 0437  Weight: 70.3 kg 66.2 kg 69.2 kg    Examination:  General:  Pleasantly resting in bed, No acute distress. HEENT:  Normocephalic atraumatic.  Sclerae nonicteric, noninjected.  Extraocular movements intact bilaterally. Neck:  Without mass or deformity.  Trachea is midline. Lungs:  Clear to auscultate bilaterally without rhonchi, wheeze, or rales. Heart:  Regular rate and rhythm.  Without murmurs, rubs, or gallops. Abdomen:  Soft, moderately tender, PMI at right lower quadrant without guarding or rebound. Extremities: Without cyanosis, clubbing, edema, or obvious deformity. Skin:  Warm and dry, no erythema.   Data Reviewed: I have personally reviewed following labs and imaging studies  CBC: Recent Labs  Lab 04/08/24 1116 04/09/24 0712 04/10/24 0330  WBC 14.5* 14.5* 12.8*  HGB 12.8 12.4 12.2  HCT 39.6 37.1 36.8  MCV 93.0 91.2 92.9  PLT 135* 125* 126*   Basic Metabolic Panel: Recent Labs  Lab 04/08/24 1116 04/09/24 0712  NA 130* 130*  K 3.9 3.4*  CL 95* 97*  CO2 21* 20*  GLUCOSE 234* 162*  BUN 29* 38*  CREATININE 1.72* 1.59*  CALCIUM  9.7 9.6   GFR: Estimated Creatinine Clearance: 32.6 mL/min (A) (by C-G formula based on SCr of 1.59 mg/dL (H)). Liver Function Tests: Recent Labs  Lab 04/08/24 1116 04/09/24 0712  AST 19 23  ALT 14 14  ALKPHOS 87 99  BILITOT 1.4* 1.0  PROT 5.8* 5.6*  ALBUMIN 3.6 3.2*   Recent Labs  Lab 04/08/24 1116  LIPASE 15   No results for input(s): AMMONIA in the last 168 hours. Coagulation Profile: Recent Labs  Lab 04/08/24 1532  INR 1.2   Cardiac Enzymes: No results for input(s): CKTOTAL, CKMB, CKMBINDEX, TROPONINI in the last 168 hours. BNP (last 3 results) No results for input(s): PROBNP in the last 8760 hours. HbA1C: No results for input(s): HGBA1C in the last 72 hours. CBG: Recent Labs  Lab 04/09/24 0722 04/09/24 1136 04/09/24 1707 04/09/24 2119 04/10/24 0746   GLUCAP 166* 229* 213* 217* 172*   Lipid Profile: No results for input(s): CHOL, HDL, LDLCALC, TRIG, CHOLHDL, LDLDIRECT in the last 72 hours. Thyroid  Function Tests: No results for input(s): TSH, T4TOTAL, FREET4, T3FREE, THYROIDAB in the last 72 hours. Anemia Panel: No results for input(s): VITAMINB12, FOLATE, FERRITIN, TIBC, IRON, RETICCTPCT in the last 72 hours. Sepsis Labs: Recent Labs  Lab 04/08/24 2009 04/08/24 2016 04/09/24 0712  PROCALCITON  --  38.90 32.60  LATICACIDVEN 2.0*  --   --     Recent Results (from the past 240 hours)  Blood Culture (routine x 2)     Status: None (Preliminary result)   Collection Time: 04/08/24  3:32 PM   Specimen: BLOOD RIGHT ARM  Result Value Ref Range Status   Specimen Description   Final    BLOOD RIGHT ARM BOTTLES DRAWN AEROBIC AND ANAEROBIC Performed at [facility], [address] [place], [place] [address]    Special Requests   Final    Blood Culture adequate volume Performed at [facility], [address] [place], [place] [address]    Culture   Final    NO GROWTH < 12 HOURS Performed at [facility], [address], [place], [place] [address]  Report Status PENDING  Incomplete  Resp panel by RT-PCR (RSV, Flu A&B, Covid) Anterior Nasal Swab     Status: None   Collection Time: 04/09/24  1:20 AM   Specimen: Anterior Nasal Swab  Result Value Ref Range Status   SARS Coronavirus 2 by RT PCR NEGATIVE NEGATIVE Final    Comment: (NOTE) SARS-CoV-2 target nucleic acids are NOT DETECTED.  The SARS-CoV-2 RNA is generally detectable in upper respiratory specimens during the acute phase of infection. The lowest concentration of SARS-CoV-2 viral copies this assay can detect is 138 copies/mL. A negative result does not preclude SARS-Cov-2 infection and should not be used as the sole basis for treatment or other patient management decisions. A negative result  may occur with  improper specimen collection/handling, submission of specimen other than nasopharyngeal swab, presence of viral mutation(s) within the areas targeted by this assay, and inadequate number of viral copies(<138 copies/mL). A negative result must be combined with clinical observations, patient history, and epidemiological information. The expected result is Negative.  Fact Sheet for Patients:  BloggerCourse.com  Fact Sheet for Healthcare Providers:  SeriousBroker.it  This test is no t yet approved or cleared by the [place]  FDA and  has been authorized for detection and/or diagnosis of SARS-CoV-2 by FDA under an Emergency Use Authorization (EUA). This EUA will remain  in effect (meaning this test can be used) for the duration of the COVID-19 declaration under Section 564(b)(1) of the Act, 21 U.S.C.section 360bbb-3(b)(1), unless the authorization is terminated  or revoked sooner.       Influenza A by PCR NEGATIVE NEGATIVE Final   Influenza B by PCR NEGATIVE NEGATIVE Final    Comment: (NOTE) The Xpert Xpress SARS-CoV-2/FLU/RSV plus assay is intended as an aid in the diagnosis of influenza from Nasopharyngeal swab specimens and should not be used as a sole basis for treatment. Nasal washings and aspirates are unacceptable for Xpert Xpress SARS-CoV-2/FLU/RSV testing.  Fact Sheet for Patients: BloggerCourse.com  Fact Sheet for Healthcare Providers: SeriousBroker.it  This test is not yet approved or cleared by the [place]  FDA and has been authorized for detection and/or diagnosis of SARS-CoV-2 by FDA under an Emergency Use Authorization (EUA). This EUA will remain in effect (meaning this test can be used) for the duration of the COVID-19 declaration under Section 564(b)(1) of the Act, 21 U.S.C. section 360bbb-3(b)(1), unless the authorization is terminated  or revoked.     Resp Syncytial Virus by PCR NEGATIVE NEGATIVE Final    Comment: (NOTE) Fact Sheet for Patients: BloggerCourse.com  Fact Sheet for Healthcare Providers: SeriousBroker.it  This test is not yet approved or cleared by the [place]  FDA and has been authorized for detection and/or diagnosis of SARS-CoV-2 by FDA under an Emergency Use Authorization (EUA). This EUA will remain in effect (meaning this test can be used) for the duration of the COVID-19 declaration under Section 564(b)(1) of the Act, 21 U.S.C. section 360bbb-3(b)(1), unless the authorization is terminated or revoked.  Performed at [facility], [address], [place], [place] [address]          Radiology Studies:  ADDENDUM #1  EXAM: CT ABDOMEN AND PELVIS WITHOUT CONTRAST 04/08/2024 05:36:04 PM TECHNIQUE: CT of the abdomen and pelvis was performed without the administration of intravenous contrast. Multiplanar reformatted images are provided for review. Automated exposure control, iterative reconstruction, and/or weight-based adjustment of the mA/kV was utilized to reduce the radiation dose to as low as reasonably achievable. COMPARISON: Ultrasound of 02/11/2016.  No prior CT. CLINICAL HISTORY: lower abdominal pain, prior renal transplant. FINDINGS: LOWER CHEST: Tiny right pleural effusion. Right greater than left lung base airspace disease. Moderate cardiomegaly. Small hiatal hernia. Coronary artery calcification. LIVER: Hepatic cysts of up to 1.8 cm. Low density capsular-based right hepatic lobe lesions of up to 2.1 x 2.5 cm (series 26, image 2). Suspect subtle gas adjacent the right hepatic capsule on image 30/2, just cephalad to the appendiceal tip. GALLBLADDER AND BILE DUCTS: Gallbladder is unremarkable. No biliary ductal dilatation. SPLEEN: No acute abnormality. PANCREAS: Mild pancreatic atrophy. ADRENAL GLANDS: No acute abnormality.  KIDNEYS, URETERS AND BLADDER: Bilateral native kidneys demonstrate moderate to marked atrophy. Upper pole right native renal artery 1.1 cm fluid density lesion is likely a cyst and does not warrant specific imaging follow-up. Lower pole left native renal artery 1.9 cm low-density lesion is also likely a cyst and does not warrant imaging follow-up. Negative left native renal artery punctate stone versus renal vascular calcification. Atrophic right iliac fossa renal transplant with presumed vascular calcifications. Left iliac fossa renal transplant without hydronephrosis or surrounding fluid collection. No bladder calculi. No hydronephrosis of the native kidneys. . Urinary bladder is unremarkable. GI AND BOWEL: Apparent gastric body wall thickening is likely due to underdistention. Periampullary duodenal diverticulum. Scattered colonic diverticula. Normal terminal ileum. The appendix originates on image 51/2 with multiple appendicoliths within including on image 49/2. The appendix is dilated at up to 1.8 cm on image 45/2 with moderate surrounding edema. There is no bowel obstruction. PERITONEUM AND RETROPERITONEUM: No ascites. No free air. VASCULATURE: Aorta is normal in caliber. Advanced aortic atherosclerosis. LYMPH NODES: No lymphadenopathy. REPRODUCTIVE ORGANS: Suspected posterior uterine fundal partially calcified 11 mm fibroid. No adnexal mass. BONES AND SOFT TISSUES: Prominent thoracolumbar spondylosis. Eccentric right disc bulge at L3-L4 including on sagittal image 102/9. Mild pelvic floor laxity. No acute osseous abnormality. No focal soft tissue abnormality. IMPRESSION: 1. Findings highly suspicious for acute appendicitis with microperforation and extraluminal gas adjacent to the right hepatic lobe. 2. Capsular-based hypoattenuating liver lesions are indeterminate on this noncontrast CT but are suspicious for hepatic abscesses, given proximity to the inflammatory process. 3. Bibasilar airspace disease with  trace right pleural effusion; although this may reflect atelectasis, infectionparticularly at the right lung baseis a concern. 4. Probable small uterine fundal fibroid. 5. Large L3-L4 eccentric right disc bulge. 6. Cardiomegaly. Aortic atherosclerosis (ICD-10 I70.0). Coronary artery atherosclerosis. Case discussed with Dr rapp at 6:45 pm. Electronically signed by: Rockey Kilts MD 04/08/2024 06:52 PM EDT RP Workstation: [id number]   Result Date: 04/08/2024  ORIGINAL REPORT  EXAM: CT ABDOMEN AND PELVIS WITHOUT CONTRAST 04/08/2024 05:36:04 PM TECHNIQUE: CT of the abdomen and pelvis was performed without the administration of intravenous contrast. Multiplanar reformatted images are provided for review. Automated exposure control, iterative reconstruction, and/or weight-based adjustment of the mA/kV was utilized to reduce the radiation dose to as low as reasonably achievable. COMPARISON: Ultrasound of 02/11/2016. No prior CT. CLINICAL HISTORY: lower abdominal pain, prior renal transplant. FINDINGS: LOWER CHEST: Tiny right pleural effusion. Right greater than left lung base airspace disease. Moderate cardiomegaly. Small hiatal hernia. Coronary artery calcification. LIVER: Hepatic cysts of up to 1.8 cm. Low density capsular-based right hepatic lobe lesions of up to 2.1 x 2.5 cm (series 26, image 2). Suspect subtle gas adjacent the right hepatic capsule on image 30/2, just cephalad to the appendiceal tip. GALLBLADDER AND BILE DUCTS: Gallbladder is unremarkable. No biliary ductal dilatation. SPLEEN: No acute abnormality. PANCREAS: Mild pancreatic atrophy. ADRENAL  GLANDS: No acute abnormality. KIDNEYS, URETERS AND BLADDER: Bilateral native kidneys demonstrate moderate to marked atrophy. Upper pole right native renal artery 1.1 cm fluid density lesion is likely a cyst and does not warrant specific imaging follow-up. Lower pole left native renal artery 1.9 cm low-density lesion is also likely a cyst and does not warrant  imaging follow-up. Negative left native renal artery punctate stone versus renal vascular calcification. Atrophic right iliac fossa renal transplant with presumed vascular calcifications. Left iliac fossa renal transplant without hydronephrosis or surrounding fluid collection. No bladder calculi. No hydronephrosis of the native kidneys. . Urinary bladder is unremarkable. GI AND BOWEL: Apparent gastric body wall thickening is likely due to underdistention. Periampullary duodenal diverticulum. Scattered colonic diverticula. Normal terminal ileum. The appendix originates on image 51/2 with multiple appendicoliths within including on image 49/2. The appendix is dilated at up to 1.8 cm on image 45/2 with moderate surrounding edema. There is no bowel obstruction. PERITONEUM AND RETROPERITONEUM: No ascites. No free air. VASCULATURE: Aorta is normal in caliber. Advanced aortic atherosclerosis. LYMPH NODES: No lymphadenopathy. REPRODUCTIVE ORGANS: Suspected posterior uterine fundal partially calcified 11 mm fibroid. No adnexal mass. BONES AND SOFT TISSUES: Prominent thoracolumbar spondylosis. Eccentric right disc bulge at L3-L4 including on sagittal image 102/9. Mild pelvic floor laxity. No acute osseous abnormality. No focal soft tissue abnormality. IMPRESSION: 1. Findings highly suspicious for acute appendicitis with microperforation and extraluminal gas adjacent to the right hepatic lobe. 2. Capsular-based hypoattenuating liver lesions are indeterminate on this noncontrast CT but are suspicious for hepatic abscesses, given proximity to the inflammatory process. 3. Bibasilar airspace disease with trace right pleural effusion; although this may reflect atelectasis, infectionparticularly at the right lung baseis a concern. 4. Probable small uterine fundal fibroid. 5. Large L3-L4 eccentric right disc bulge. 6. Cardiomegaly. Aortic atherosclerosis (ICD-10 I70.0). Coronary artery atherosclerosis. A return call from the  clinical service is pending. Electronically signed by: Rockey Kilts MD 04/08/2024 06:21 PM EDT RP Workstation: [id number]   DG Chest 2 View Result Date: 04/08/2024 EXAM: 2 VIEW(S) XRAY OF THE CHEST 04/08/2024 11:53:51 AM COMPARISON: 05/05/220 CLINICAL HISTORY: ? PNA. Per chart: Pt presents after being sent from PCP for dx of pneumonia with suspicion for aspiration pneumonia. Pt also has right lower quadrant pain they felt was suspicous for appendicitis. Initially had n/v but not today. FINDINGS: LUNGS AND PLEURA: Increased bibasilar opacities are noted concerning for worsening atelectasis or pneumonia. Small right pleural effusion is noted. No pulmonary edema. No pneumothorax. HEART AND MEDIASTINUM: Stable cardiomediastinal silhouette. BONES AND SOFT TISSUES: No acute osseous abnormality. IMPRESSION: 1. Increased bibasilar opacities, which may reflect worsening atelectasis or pneumonia. 2. Small right pleural effusion. Electronically signed by: Lynwood Seip MD 04/08/2024 12:23 PM EDT RP Workstation: [id number]      Scheduled Meds:  azaTHIOprine   25 mg Oral Daily   ezetimibe   10 mg Oral QHS   insulin  aspart  0-9 Units Subcutaneous TID WC   insulin  glargine-yfgn  16 Units Subcutaneous QPM   labetalol   200 mg Oral BID   magnesium  oxide  400 mg Oral BID   pantoprazole  (PROTONIX ) IV  40 mg Intravenous Q24H   predniSONE   5 mg Oral Q breakfast   simvastatin   20 mg Oral QHS   [START ON 04/11/2024] sulfamethoxazole-trimethoprim  1 tablet Oral Once per day on Monday Wednesday Friday   tacrolimus   4 mg Oral BID   Continuous Infusions:  azithromycin 500 mg (04/09/24 1849)   cefTRIAXone (ROCEPHIN)  IV 2 g (04/09/24 0911)  metronidazole 500 mg (04/09/24 2010)     LOS: 2 days    Time spent:  Elsie JAYSON Montclair, DO [facility]  If 7PM-7AM, please contact night-coverage [email or web address]  04/10/2024, 8:22 AM

## 2024-04-10 NOTE — Progress Notes (Signed)
   Subjective/Chief Complaint: Feels better, having bowel function, no n/v   Objective: Vital signs in last 24 hours: Temp:  [97.5 F (36.4 C)-98.5 F (36.9 C)] 98.5 F (36.9 C) (10/19 0358) Pulse Rate:  [74-92] 92 (10/19 0909) Resp:  [17-20] 20 (10/19 0358) BP: (111-142)/(52-66) 140/66 (10/19 0909) SpO2:  [89 %-92 %] 92 % (10/19 0358) Last BM Date : 04/09/24  Intake/Output from previous day: 10/18 0701 - 10/19 0700 In: 760 [P.O.:360; IV Piggyback:400] Out: 200 [Urine:200] Intake/Output this shift: Total I/O In: 240 [P.O.:240] Out: -   Ab soft mild tender right side to deeper palpation, no peritoneal signs  Lab Results:  Recent Labs    04/09/24 0712 04/10/24 0330  WBC 14.5* 12.8*  HGB 12.4 12.2  HCT 37.1 36.8  PLT 125* 126*   BMET Recent Labs    04/08/24 1116 04/09/24 0712  NA 130* 130*  K 3.9 3.4*  CL 95* 97*  CO2 21* 20*  GLUCOSE 234* 162*  BUN 29* 38*  CREATININE 1.72* 1.59*  CALCIUM  9.7 9.6   PT/INR Recent Labs    04/08/24 1532  LABPROT 15.8*  INR 1.2     Anti-infectives: Anti-infectives (From admission, onward)    Start     Dose/Rate Route Frequency Ordered Stop   04/11/24 0900  sulfamethoxazole-trimethoprim (BACTRIM) 400-80 MG per tablet 1 tablet        1 tablet Oral Once per day on Monday Wednesday Friday 04/08/24 2052     04/09/24 1700  azithromycin (ZITHROMAX) 500 mg in sodium chloride  0.9 % 250 mL IVPB        500 mg 250 mL/hr over 60 Minutes Intravenous Every 24 hours 04/08/24 2037     04/09/24 0800  cefTRIAXone (ROCEPHIN) 2 g in sodium chloride  0.9 % 100 mL IVPB        2 g 200 mL/hr over 30 Minutes Intravenous Every 24 hours 04/08/24 1932     04/09/24 0600  metroNIDAZOLE (FLAGYL) IVPB 500 mg        500 mg 100 mL/hr over 60 Minutes Intravenous Every 12 hours 04/08/24 2039     04/08/24 1845  metroNIDAZOLE (FLAGYL) IVPB 500 mg        500 mg 100 mL/hr over 60 Minutes Intravenous  Once 04/08/24 1836 04/09/24 0028   04/08/24 1530   cefTRIAXone (ROCEPHIN) 1 g in sodium chloride  0.9 % 100 mL IVPB        1 g 200 mL/hr over 30 Minutes Intravenous  Once 04/08/24 1525 04/08/24 1700   04/08/24 1530  azithromycin (ZITHROMAX) 500 mg in sodium chloride  0.9 % 250 mL IVPB        500 mg 250 mL/hr over 60 Minutes Intravenous  Once 04/08/24 1525 04/09/24 0027       Assessment/Plan: Perforated appendicitis Clinically patient looks and feels significantly improved.   Repeat CT today.  Time course only 2 days but location and immunosuppression can mask symptoms.  -reasonable to continue abx for now but have low threshold if not better to take to OR WBCs improving.   -PNA also for which she is on abx -continue hydration  -pharm dvt prophylaxis fine   Jina LITTIE Nephew, MD, FACS, FSSO Surgical Oncology, General Surgery, [facility], [place] [phone] for weekday/non holidays Check [email or web address] for coverage night/weekend/holidays

## 2024-04-10 NOTE — Plan of Care (Signed)
   Problem: Clinical Measurements: Goal: Ability to maintain clinical measurements within normal limits will improve Outcome: Progressing   Problem: Coping: Goal: Level of anxiety will decrease Outcome: Progressing

## 2024-04-11 ENCOUNTER — Encounter (HOSPITAL_COMMUNITY): Payer: Self-pay | Admitting: Family Medicine

## 2024-04-11 ENCOUNTER — Inpatient Hospital Stay (HOSPITAL_COMMUNITY): Admitting: Anesthesiology

## 2024-04-11 ENCOUNTER — Encounter (HOSPITAL_COMMUNITY): Admission: EM | Disposition: A | Payer: Self-pay | Source: Ambulatory Visit | Attending: Family Medicine

## 2024-04-11 DIAGNOSIS — I120 Hypertensive chronic kidney disease with stage 5 chronic kidney disease or end stage renal disease: Secondary | ICD-10-CM | POA: Diagnosis not present

## 2024-04-11 DIAGNOSIS — K3580 Unspecified acute appendicitis: Secondary | ICD-10-CM | POA: Diagnosis not present

## 2024-04-11 DIAGNOSIS — J189 Pneumonia, unspecified organism: Secondary | ICD-10-CM | POA: Diagnosis not present

## 2024-04-11 DIAGNOSIS — N179 Acute kidney failure, unspecified: Secondary | ICD-10-CM | POA: Diagnosis not present

## 2024-04-11 DIAGNOSIS — N186 End stage renal disease: Secondary | ICD-10-CM | POA: Diagnosis not present

## 2024-04-11 DIAGNOSIS — Z940 Kidney transplant status: Secondary | ICD-10-CM | POA: Diagnosis not present

## 2024-04-11 DIAGNOSIS — Z87891 Personal history of nicotine dependence: Secondary | ICD-10-CM | POA: Diagnosis not present

## 2024-04-11 DIAGNOSIS — K353 Acute appendicitis with localized peritonitis, without perforation or gangrene: Secondary | ICD-10-CM | POA: Diagnosis not present

## 2024-04-11 HISTORY — PX: LAPAROSCOPIC APPENDECTOMY: SHX408

## 2024-04-11 LAB — CBC
HCT: 35.6 % — ABNORMAL LOW (ref 36.0–46.0)
Hemoglobin: 11.9 g/dL — ABNORMAL LOW (ref 12.0–15.0)
MCH: 30.9 pg (ref 26.0–34.0)
MCHC: 33.4 g/dL (ref 30.0–36.0)
MCV: 92.5 fL (ref 80.0–100.0)
Platelets: 142 K/uL — ABNORMAL LOW (ref 150–400)
RBC: 3.85 MIL/uL — ABNORMAL LOW (ref 3.87–5.11)
RDW: 13.1 % (ref 11.5–15.5)
WBC: 13.3 K/uL — ABNORMAL HIGH (ref 4.0–10.5)
nRBC: 0 % (ref 0.0–0.2)

## 2024-04-11 LAB — BASIC METABOLIC PANEL WITH GFR
Anion gap: 11 (ref 5–15)
BUN: 29 mg/dL — ABNORMAL HIGH (ref 8–23)
CO2: 21 mmol/L — ABNORMAL LOW (ref 22–32)
Calcium: 9.8 mg/dL (ref 8.9–10.3)
Chloride: 100 mmol/L (ref 98–111)
Creatinine, Ser: 1.15 mg/dL — ABNORMAL HIGH (ref 0.44–1.00)
GFR, Estimated: 51 mL/min — ABNORMAL LOW (ref >60)
Glucose, Bld: 208 mg/dL — ABNORMAL HIGH (ref 70–99)
Potassium: 3.5 mmol/L (ref 3.5–5.1)
Sodium: 133 mmol/L — ABNORMAL LOW (ref 135–145)

## 2024-04-11 LAB — GLUCOSE, CAPILLARY
Glucose-Capillary: 178 mg/dL — ABNORMAL HIGH (ref 70–99)
Glucose-Capillary: 118 mg/dL — ABNORMAL HIGH (ref 70–99)
Glucose-Capillary: 146 mg/dL — ABNORMAL HIGH (ref 70–99)
Glucose-Capillary: 181 mg/dL — ABNORMAL HIGH (ref 70–99)
Glucose-Capillary: 263 mg/dL — ABNORMAL HIGH (ref 70–99)

## 2024-04-11 SURGERY — APPENDECTOMY, LAPAROSCOPIC
Anesthesia: General

## 2024-04-11 MED ORDER — ROCURONIUM BROMIDE 10 MG/ML (PF) SYRINGE
PREFILLED_SYRINGE | INTRAVENOUS | Status: AC
  Filled 2024-04-11: qty 10

## 2024-04-11 MED ORDER — FENTANYL CITRATE (PF) 250 MCG/5ML IJ SOLN
INTRAMUSCULAR | Status: DC | PRN
  Administered 2024-04-11: 100 ug via INTRAVENOUS
  Administered 2024-04-11: 50 ug via INTRAVENOUS

## 2024-04-11 MED ORDER — BUPIVACAINE-EPINEPHRINE (PF) 0.25% -1:200000 IJ SOLN
INTRAMUSCULAR | Status: AC
  Filled 2024-04-11: qty 30

## 2024-04-11 MED ORDER — LACTATED RINGERS IV SOLN: INTRAVENOUS | Status: DC

## 2024-04-11 MED ORDER — DEXAMETHASONE SOD PHOSPHATE PF 10 MG/ML IJ SOLN
INTRAMUSCULAR | Status: DC | PRN
  Administered 2024-04-11: 5 mg via INTRAVENOUS

## 2024-04-11 MED ORDER — 0.9 % SODIUM CHLORIDE (POUR BTL) OPTIME
TOPICAL | Status: DC | PRN
  Administered 2024-04-11: 1000 mL

## 2024-04-11 MED ORDER — LIDOCAINE HCL (PF) 2 % IJ SOLN
INTRAMUSCULAR | Status: DC | PRN
  Administered 2024-04-11: 60 mg via INTRADERMAL

## 2024-04-11 MED ORDER — HYDROMORPHONE HCL 1 MG/ML IJ SOLN
INTRAMUSCULAR | Status: AC
  Filled 2024-04-11: qty 1

## 2024-04-11 MED ORDER — MIDAZOLAM HCL (PF) 2 MG/2ML IJ SOLN
INTRAMUSCULAR | Status: DC | PRN
  Administered 2024-04-11: 1 mg via INTRAVENOUS

## 2024-04-11 MED ORDER — MIDAZOLAM HCL 2 MG/2ML IJ SOLN
INTRAMUSCULAR | Status: AC
  Filled 2024-04-11: qty 2

## 2024-04-11 MED ORDER — ACETAMINOPHEN 500 MG PO TABS: 1000.0000 mg | ORAL_TABLET | Freq: Once | ORAL | Status: AC

## 2024-04-11 MED ORDER — ROCURONIUM BROMIDE 10 MG/ML (PF) SYRINGE
PREFILLED_SYRINGE | INTRAVENOUS | Status: DC | PRN
Start: 2024-04-11 — End: 2024-04-11
  Administered 2024-04-11: 10 mg via INTRAVENOUS
  Administered 2024-04-11: 50 mg via INTRAVENOUS

## 2024-04-11 MED ORDER — LIDOCAINE HCL (PF) 2 % IJ SOLN
INTRAMUSCULAR | Status: AC
  Filled 2024-04-11: qty 5

## 2024-04-11 MED ORDER — FENTANYL CITRATE (PF) 250 MCG/5ML IJ SOLN
INTRAMUSCULAR | Status: AC
  Filled 2024-04-11: qty 5

## 2024-04-11 MED ORDER — PROPOFOL 10 MG/ML IV BOLUS
INTRAVENOUS | Status: DC | PRN
  Administered 2024-04-11: 100 mg via INTRAVENOUS
  Administered 2024-04-11: 50 mg via INTRAVENOUS

## 2024-04-11 MED ORDER — ACETAMINOPHEN 500 MG PO TABS
ORAL_TABLET | ORAL | Status: AC
  Administered 2024-04-11: 1000 mg via ORAL
  Filled 2024-04-11: qty 2

## 2024-04-11 MED ORDER — CHLORHEXIDINE GLUCONATE 0.12 % MT SOLN
15.0000 mL | Freq: Once | OROMUCOSAL | Status: AC
  Administered 2024-04-11: 15 mL via OROMUCOSAL

## 2024-04-11 MED ORDER — PROPOFOL 10 MG/ML IV BOLUS
INTRAVENOUS | Status: AC
  Filled 2024-04-11: qty 20

## 2024-04-11 MED ORDER — LACTATED RINGERS IR SOLN
Status: DC | PRN
  Administered 2024-04-11: 1000 mL

## 2024-04-11 MED ORDER — SUGAMMADEX SODIUM 200 MG/2ML IV SOLN
INTRAVENOUS | Status: DC | PRN
  Administered 2024-04-11: 200 mg via INTRAVENOUS

## 2024-04-11 MED ORDER — HYDROMORPHONE HCL 1 MG/ML IJ SOLN
0.5000 mg | INTRAMUSCULAR | Status: DC | PRN
  Administered 2024-04-11: 0.5 mg via INTRAVENOUS
  Filled 2024-04-11: qty 0.5

## 2024-04-11 MED ORDER — BUPIVACAINE-EPINEPHRINE (PF) 0.25% -1:200000 IJ SOLN
INTRAMUSCULAR | Status: DC | PRN
  Administered 2024-04-11: 30 mL

## 2024-04-11 MED ORDER — HYDROMORPHONE HCL 1 MG/ML IJ SOLN
0.2500 mg | INTRAMUSCULAR | Status: DC | PRN
  Administered 2024-04-11 (×2): 0.25 mg via INTRAVENOUS

## 2024-04-11 SURGICAL SUPPLY — 34 items
BAG COUNTER SPONGE SURGICOUNT (BAG) ×1 IMPLANT
CHLORAPREP W/TINT 26 (MISCELLANEOUS) ×1 IMPLANT
CLIP APPLIE ROT 10 11.4 M/L (STAPLE) IMPLANT
CORD SCALPEL HARMONIC (MISCELLANEOUS) ×1 IMPLANT
COVER SURGICAL LIGHT HANDLE (MISCELLANEOUS) ×1 IMPLANT
DERMABOND ADVANCED .7 DNX12 (GAUZE/BANDAGES/DRESSINGS) ×1 IMPLANT
DRAIN CHANNEL 19F RND (DRAIN) IMPLANT
EVACUATOR SILICONE 100CC (DRAIN) IMPLANT
GLOVE BIO SURGEON STRL SZ7.5 (GLOVE) ×1 IMPLANT
GLOVE INDICATOR 8.0 STRL GRN (GLOVE) ×1 IMPLANT
GOWN STRL REUS W/ TWL XL LVL3 (GOWN DISPOSABLE) ×1 IMPLANT
GRASPER SUT TROCAR 14GX15 (MISCELLANEOUS) ×1 IMPLANT
IRRIGATION SUCT STRKRFLW 2 WTP (MISCELLANEOUS) IMPLANT
KIT BASIN OR (CUSTOM PROCEDURE TRAY) ×1 IMPLANT
KIT TURNOVER KIT A (KITS) ×1 IMPLANT
LIGASURE LAP MARYLAND 5MM 37CM (ELECTROSURGICAL) ×1 IMPLANT
NDL INSUFFLATION 14GA 120MM (NEEDLE) ×1 IMPLANT
NEEDLE INSUFFLATION 14GA 120MM (NEEDLE) ×1 IMPLANT
RELOAD STAPLE 60 2.6 WHT THN (STAPLE) IMPLANT
SCISSORS LAP 5X35 DISP (ENDOMECHANICALS) IMPLANT
SET TUBE SMOKE EVAC HIGH FLOW (TUBING) ×1 IMPLANT
SLEEVE ADV FIXATION 5X100MM (TROCAR) ×1 IMPLANT
SLEEVE Z-THREAD 5X100MM (TROCAR) IMPLANT
SPIKE FLUID TRANSFER (MISCELLANEOUS) ×1 IMPLANT
STAPLER ECHELON LONG 60 440 (INSTRUMENTS) ×1 IMPLANT
SUT ETHILON 2 0 PS N (SUTURE) IMPLANT
SUT ETHILON 3 0 PS 1 (SUTURE) IMPLANT
SUT MNCRL AB 4-0 PS2 18 (SUTURE) ×1 IMPLANT
SUT VICRYL 0 UR6 27IN ABS (SUTURE) ×1 IMPLANT
TOWEL OR 17X26 10 PK STRL BLUE (TOWEL DISPOSABLE) IMPLANT
TRAY FOLEY MTR SLVR 14FR STAT (SET/KITS/TRAYS/PACK) IMPLANT
TRAY LAPAROSCOPIC (CUSTOM PROCEDURE TRAY) ×1 IMPLANT
TROCAR ADV FIXATION 12X100MM (TROCAR) ×1 IMPLANT
TROCAR ADV FIXATION 5X100MM (TROCAR) ×1 IMPLANT

## 2024-04-11 NOTE — Consult Note (Signed)
 Chief Complaint: Abdominal pain, perforated appendicitis, pneumonia, subcapsular fluid collections inferior right hepatic lobe; referred for image guided aspiration versus drainage of subcapsular hepatic fluid collections  Referring Provider(s): [place],W  Supervising Physician: Jenna Hacker  Patient Status: [facility] - In-pt  History of Present Illness: Kimberly [name] is a 68 y.o. female with past medical history significant for colon polyp, anemia, diabetes, chronic kidney disease, goiter, gout, hypertension, hyperlipidemia, hypothyroidism, vitamin B12 deficiency, history of kidney transplant status post DDKT 2022.  She was recently admitted to [facility] with persistent abdominal pain.  Subsequent imaging revealed acute appendicitis, distal colonic diverticulosis, bilateral airspace disease/pneumonia, as well as enlarging subcapsular fluid collections along the inferior right hepatic lobe concerning for possible abscess.  Request now received for image guided aspiration versus drainage of subcapsular hepatic fluid collections.  Current temp 99.2, WBC 13.3, hemoglobin 11.9, platelets 142k.  Creatinine 1.15, PT 15.8, INR 1.2.  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     Past Surgical History:  Procedure Laterality Date   A/V FISTULAGRAM N/A 10/03/2019   Procedure: A/V FISTULAGRAM - Left;  Surgeon: Sheree Penne Bruckner, MD;  Location: [facility] INVASIVE CV LAB;  Service:  Cardiovascular;  Laterality: N/A;   AV FISTULA PLACEMENT Left 2008   colonscopy  02/2016   FISTULOGRAM Left 05/18/2019   Procedure: LEFT ARM FISTULOGRAM WITH BALLOON ANGIOPLASTY OF LEFT cephalic arch with 8mm balloon;  Surgeon: Sheree Penne Bruckner, MD;  Location: [facility] OR;  Service: Vascular;  Laterality: Left;   GANGLION CYST EXCISION Right 1994   KIDNEY TRANSPLANT  June 2010   MINOR FULGERATION OF ANAL CONDYLOMA N/A 07/31/2017   Procedure: EXCISION  FULGERATION OF ANAL CONDYLOMA;  Surgeon: Teresa Bruckner HERO, MD;  Location: [facility] ORS;  Service: General;  Laterality: N/A;   PERIPHERAL VASCULAR BALLOON ANGIOPLASTY Left 10/03/2019   Procedure: PERIPHERAL VASCULAR BALLOON ANGIOPLASTY;  Surgeon: Sheree Penne Bruckner, MD;  Location[facility] INVASIVE CV LAB;  Service: Cardiovascular;  Laterality: Left;  AVF   RECTAL EXAM UNDER ANESTHESIA N/A 07/03/2016   Procedure: RECTAL EXAM UNDER ANESTHESIA;  Surgeon: Camellia Blush, MD;  Location: [facility] ORS;  Service: General;  Laterality: N/A;   REVISON OF ARTERIOVENOUS FISTULA Left 05/18/2019   Procedure: revision Of  left upper arm Arteriovenous Fistula with excision of aneurysm;  Surgeon: Sheree Penne Bruckner, MD;  Location: [facility] OR;  Service: Vascular;  Laterality: Left;   SHOULDER ARTHROSCOPY W/ ROTATOR CUFF REPAIR Right yrs ago   UPPER GI ENDOSCOPY  sept   2017   WART FULGURATION N/A 07/03/2016   Procedure: EXCISION FULGURATION ANAL WART EXCISIONAL BIOSY OF EXTERNAL HEMORROID;  Surgeon: Camellia Blush, MD;  Location: [facility] ORS;  Service: General;  Laterality: N/A;    Allergies: Sucroferric oxyhydroxide, Other, Tape, and Phoslo  [calcium  acetate]  Medications: Prior to Admission medications  Medication Sig Start Date End Date Taking? Authorizing Provider  acetaminophen  (TYLENOL ) 500 MG tablet Take by mouth.   Yes [provider]  amLODipine  (NORVASC ) 10 MG tablet Take 10 mg by mouth every evening.    Yes [provider]  amoxicillin  (AMOXIL )  500 MG capsule Take 2,000 mg by mouth See admin instructions. Take 4 capsules (2000 mg) by mouth 1 hour prior to dental work 04/26/19  Yes [provider]  azaTHIOprine  (IMURAN ) 50 MG tablet Take 1 tablet by mouth daily. 10/06/22  Yes [provider]  ezetimibe -simvastatin  (VYTORIN ) 10-20 MG per tablet Take 1 tablet by mouth at bedtime.   Yes [provider]  glucose blood (ONETOUCH VERIO) test strip Check sugars up to 3 times a day Dx: Diabetes on insulin  tx with labile sugars due to ESRD/dialysis E11.319 10/18/18  Yes [provider]  hydrALAZINE  (APRESOLINE ) 25 MG tablet Take 25 mg by mouth daily. 03/15/24  Yes [provider]  labetalol  (NORMODYNE ) 200 MG tablet Take 1 tablet (200 mg total) by mouth 2 (two) times daily. 06/25/16  Yes Rojelio Nest, DO  magnesium  oxide (MAG-OX) 400 MG tablet Take 2 tablets by mouth 2 (two) times daily.   Yes [provider]  NOVOLOG  FLEXPEN 100 UNIT/ML FlexPen Inject 5-7 Units into the skin 3 (three) times daily with meals. 7 units in the AM with breakfast, 7 units in the afternoon with lunch, 5 units in the PM with dinner 12/17/22  Yes [provider]  ondansetron  (ZOFRAN ) 4 MG tablet Take 4 mg by mouth every 8 (eight) hours as needed. 04/07/24  Yes [provider]  pantoprazole  (PROTONIX ) 40 MG tablet Take 40 mg by mouth daily.   Yes [provider]  polyethylene glycol (MIRALAX / GLYCOLAX) 17 g packet Take 17 g by mouth daily as needed. 12/03/20  Yes [provider]  predniSONE  (DELTASONE ) 5 MG tablet Take 5 mg by mouth daily.   Yes [provider]  senna-docusate (SENOKOT-S) 8.6-50 MG tablet Take 1 tablet by mouth at bedtime as needed. 12/03/20  Yes [provider]  sulfamethoxazole-trimethoprim (BACTRIM) 400-80 MG tablet Take 1 tablet by mouth 3 (three) times a week.   Yes [provider]  tacrolimus  (PROGRAF ) 1 MG capsule Take 4 mg by mouth 2 (two)  times daily.   Yes [provider]  TOUJEO  MAX SOLOSTAR 300 UNIT/ML SOPN Inject 16 Units into the skin every evening. 03/09/19  Yes [provider]  methocarbamol (ROBAXIN) 500 MG tablet Take 500 mg by mouth 3 (three) times daily as needed. Patient not taking: Reported on 04/08/2024 02/17/24   [provider]     Family History  Problem Relation Age of Onset   Heart disease Mother    Cancer - Other Father        gum to throat   Colon cancer Sister    Colon cancer Brother    Esophageal cancer Neg Hx    Stomach cancer Neg Hx    Rectal cancer Neg Hx     Social History   Socioeconomic History   Marital status: Married    Spouse name: Not on file   Number of children: 3   Years of education: Not on file   Highest education level: Not on file  Occupational History   Occupation: retired  Tobacco Use   Smoking status: Former    Current packs/day: 0.00    Average packs/day: 1 pack/day for 20.0 years (20.0 ttl pk-yrs)  Types: Cigarettes    Start date: 07/09/1977    Quit date: 07/09/1997    Years since quitting: 26.7   Smokeless tobacco: Never  Vaping Use   Vaping status: Never Used  Substance and Sexual Activity   Alcohol  use: Yes    Comment: rarely   Drug use: No   Sexual activity: Yes    Birth control/protection: Post-menopausal  Other Topics Concern   Not on file  Social History Narrative   Not on file   Social Drivers of Health   Financial Resource Strain: Not on file  Food Insecurity: No Food Insecurity (04/09/2024)   Hunger Vital Sign    Worried About Running Out of Food in the Last Year: Never true    Ran Out of Food in the Last Year: Never true  Transportation Needs: No Transportation Needs (04/09/2024)   PRAPARE - Administrator, Civil Service (Medical): No    Lack of Transportation (Non-Medical): No  Physical Activity: Not on file  Stress: Not on file  Social Connections: Socially Isolated (04/09/2024)   Social  Connection and Isolation Panel    Frequency of Communication with Friends and Family: Never    Frequency of Social Gatherings with Friends and Family: Twice a week    Attends Religious Services: Never    Database administrator or Organizations: No    Attends Engineer, structural: Never    Marital Status: Divorced       Review of Systems currently denies fever, headache, chest pain, worsening dyspnea, cough, back pain, nausea, vomiting or bleeding.  She does have some right lower quadrant discomfort.  Vital Signs: BP (!) 152/69 (BP Location: Right Arm)   Pulse 91   Temp 99.2 F (37.3 C) (Oral)   Resp 20   Ht 5' 5 (1.651 m)   Wt 152 lb 8.9 oz (69.2 kg)   SpO2 92%   BMI 25.39 kg/m   Advance Care Plan: No documents on file  Physical Exam: Awake, alert.  Chest with few bibasilar crackles.  Heart with regular rate and rhythm.  Abdomen soft, positive bowel sounds,  mild to moderately tender right lower quadrant to palpation.  No lower extremity edema.  Imaging: i  Labs:  CBC: Recent Labs    04/08/24 1116 04/09/24 0712 04/10/24 0330 04/11/24 0319  WBC 14.5* 14.5* 12.8* 13.3*  HGB 12.8 12.4 12.2 11.9*  HCT 39.6 37.1 36.8 35.6*  PLT 135* 125* 126* 142*    COAGS: Recent Labs    04/08/24 1532  INR 1.2    BMP: Recent Labs    04/08/24 1116 04/09/24 0712 04/11/24 0319  NA 130* 130* 133*  K 3.9 3.4* 3.5  CL 95* 97* 100  CO2 21* 20* 21*  GLUCOSE 234* 162* 208*  BUN 29* 38* 29*  CALCIUM  9.7 9.6 9.8  CREATININE 1.72* 1.59* 1.15*  GFRNONAA 32* 35* 51*    LIVER FUNCTION TESTS: Recent Labs    04/08/24 1116 04/09/24 0712  BILITOT 1.4* 1.0  AST 19 23  ALT 14 14  ALKPHOS 87 99  PROT 5.8* 5.6*  ALBUMIN 3.6 3.2*    TUMOR MARKERS: No results for input(s): AFPTM, CEA, CA199, CHROMGRNA in the last 8760 hours.  Assessment and Plan: 68 y.o. female with past medical history significant for colon polyp, anemia, diabetes, chronic kidney disease,  goiter, gout, hypertension, hyperlipidemia, hypothyroidism, vitamin B12 deficiency, history of kidney transplant status post DDKT 2022.  She was recently admitted to [facility] with persistent abdominal pain.  Subsequent imaging revealed acute appendicitis, distal colonic diverticulosis, bilateral airspace disease/pneumonia, as well as enlarging subcapsular fluid collections along the inferior right hepatic lobe concerning for possible abscess.  Request now received for image guided aspiration versus drainage of subcapsular hepatic fluid collections.  Current temp 99.2, WBC 13.3, hemoglobin 11.9, platelets 142k.  Creatinine 1.15, PT 15.8, INR 1.2.  Latest imaging studies were reviewed by Dr. Jenna.Risks and benefits discussed with the patient/spouse including bleeding, infection, damage to adjacent structures, and sepsis.  All of the patient's questions were answered, patient is agreeable to proceed. Consent signed and in chart.  Procedure tentatively scheduled for today. Known to IR team from prior hemodialysis catheter placement in 2012  Thank you for allowing our service to participate in Kimberly [name] 's care.  Electronically Signed: D. Franky Rakers, PA-C   04/11/2024, 9:57 AM      I spent a total of  25 minutes   in face to face in clinical consultation, greater than 50% of which was counseling/coordinating care for image guided aspiration versus drainage of subcapsular hepatic fluid collections

## 2024-04-11 NOTE — Anesthesia Postprocedure Evaluation (Signed)
 Anesthesia Post Note  Patient: Krysta Bloomfield  Procedure(s) Performed: APPENDECTOMY, LAPAROSCOPIC     Patient location during evaluation: PACU Anesthesia Type: General Level of consciousness: awake and alert Pain management: pain level controlled Vital Signs Assessment: post-procedure vital signs reviewed and stable Respiratory status: spontaneous breathing, nonlabored ventilation, respiratory function stable and patient connected to nasal cannula oxygen  Cardiovascular status: blood pressure returned to baseline and stable Postop Assessment: no apparent nausea or vomiting Anesthetic complications: no   No notable events documented.  Last Vitals:  Vitals:   04/11/24 1715 04/11/24 1739  BP: (!) 140/65 (!) 157/68  Pulse: 73 73  Resp: 20 16  Temp:  36.7 C  SpO2: 93% 92%    Last Pain:  Vitals:   04/11/24 1751  TempSrc:   PainSc: 9                  Foster Sonnier,W. EDMOND

## 2024-04-11 NOTE — Op Note (Signed)
 Patient: Kimberly [name] (1956-03-31, [id number])  Date of Surgery: 04/11/2024  Preoperative Diagnosis: Acute ruptured appendicitis   Postoperative Diagnosis: Acute ruptured appendicitis   Surgical Procedure: Laparoscopic appendectomy   Operative Team Members:  Surgeons and Role:    * Ahnyla Mendel, Deward PARAS, MD - Primary   Anesthesiologist: Epifanio Fallow, MD CRNA: Judythe Tanda Aran, CRNA   Anesthesia: General   Fluids:  Total I/O In: 100 [IV Piggyback:100] Out: 20 [Blood:20]  Complications: None  Drains:  (19) Jackson-Pratt drain(s) with closed bulb suction in the Morrison's pouch exiting the right abdominal incision.   Specimen:  ID Type Source Tests Collected by Time Destination  1 : Appendix Tissue PATH GI Other SURGICAL PATHOLOGY Cledith Kamiya, Deward PARAS, MD 04/11/2024 1439      Disposition:  PACU - hemodynamically stable.  Plan of Care: Continue inpatient care    Indications for Procedure: Shakaria Raphael is a 68 y.o. female who presented with abdominal pain.  History, physical and imaging was concerning for appendicitis, so laparoscopic appendectomy was recommended for the patient.  The procedure itself, as well as the risks, benefits and alternatives were discussed with the patient.  Risks discussed included but were not limited to the risk of bleeding, infection, damage to nearby structures, need to convert to open procedure, incisional hernia, and the need for additional procedures or surgeries.  With this discussion complete and all questions answered the patient granted consent to proceed.  Findings: Perforated appendicitis, high riding appendix with abscess in Morrison's pouch  Infection status: Patient: [facility] Patient Case: Urgent Infection Present At Time Of Surgery (PATOS): Abscess cavity and inflamed appendix as described   Description of Procedure:   On the date stated above, the patient was  taken to the operating room suite and placed in supine positioning with the left arm tucked.  Sequential compression devices were placed on the lower extremities to prevent blood clots.  General endotracheal anesthesia was induced.  The patient urinated just prior to surgery so a foley catheter was not placed.  Preoperative antibiotics were given.  The patient's abdomen was prepped and draped in the usual sterile fashion.  A time-out was completed verifying the correct patient, procedure, positioning and equipment needed for the case.  We began by anesthetizing the skin with local anesthetic and then making a 5 mm incision just above the umbilicus.  We dissected through the subcutaneous tissues to the fascia.  The fascia was grasped and elevated using a Kocher clamp.  A Veress needle was inserted into the abdomen and the abdomen was insufflated to 15 mmHg.  A 5 mm trocar was inserted in this position under optical guidance and then the abdomen was inspected.  There was no trauma to the underlying viscera with initial trocar placement.  Any abnormal findings, other than inflammation in the right lower quadrant, are listed above in the findings section.  Two additional trocars were placed, one 5 mm trocar suprapubically and one 12 mm trocar in the mid upper abdomen midline.  These were placed under direct vision without any trauma to the underlying viscera.  These were placed higher in the abdomen as the cecum was sitting in the right upper quadrant.  The patient was then placed in head up, left side down positioning.  The appendix was identified and dissected free from its attachments to the abdominal wall, small intestine and cecum.  A window was created in the mesoappendix using blunt dissection.  We used one  60 mm white load of the endoscopic linear stapler to divide the base of the appendix from the cecum.  Then the ligasure maryland  was used to divide the mesoappendix.  The tip of the appendix travelled up  into Morrison's pouch and an abscess cavity was encountered and drained from this area.  The appendix was placed in an endocatch bag and removed through the 12 mm port site.  A suction irrigator was used to clean the operative field. The staple line was well formed.  There was good hemostasis at the end of the case.  A 19 Fr. JP drain was brought through the right abdominal wall and positioned in Morrison's pouch.  It was sutured to the skin using nylon suture.  At this point we directed our attention to closure.  The patient was moved back to a level position.  The 12 mm trocar site was closed at the fascial level using an 0-vicryl on a fascial suture passer.  The abdomen was desufflated.  The skin was closed using 4-0 Monocryl and dermabond.  All sponge and needle counts were correct at the end of the case.    Deward Foy, MD General, Bariatric, & Minimally Invasive Surgery [facility], [place]

## 2024-04-11 NOTE — Progress Notes (Signed)
 Subjective: CC: Overall improved but stable from yesterday RLQ abdominal pain. No n/v. NPO. BM that was non-bloody yesterday. Required 2 doses of pain medication yesterday.   Objective: Vital signs in last 24 hours: Temp:  [98.5 F (36.9 C)-99.2 F (37.3 C)] 99.2 F (37.3 C) (10/20 0528) Pulse Rate:  [76-91] 91 (10/20 0528) Resp:  [18-20] 20 (10/20 0528) BP: (129-152)/(69-75) 152/69 (10/20 0528) SpO2:  [89 %-93 %] 92 % (10/20 0528) Last BM Date : 04/09/24  Intake/Output from previous day: 10/19 0701 - 10/20 0700 In: 1390 [P.O.:840; IV Piggyback:550] Out: -  Intake/Output this shift: No intake/output data recorded.  PE: Gen:  Alert, NAD, pleasant Abd: Soft, ND, periumbilical and RLQ ttp without rigidity or guarding.   Lab Results:  Recent Labs    04/10/24 0330 04/11/24 0319  WBC 12.8* 13.3*  HGB 12.2 11.9*  HCT 36.8 35.6*  PLT 126* 142*   BMET Recent Labs    04/09/24 0712 04/11/24 0319  NA 130* 133*  K 3.4* 3.5  CL 97* 100  CO2 20* 21*  GLUCOSE 162* 208*  BUN 38* 29*  CREATININE 1.59* 1.15*  CALCIUM  9.6 9.8   PT/INR Recent Labs    04/08/24 1532  LABPROT 15.8*  INR 1.2   CMP     Component Value Date/Time   NA 133 (L) 04/11/2024 0319   K 3.5 04/11/2024 0319   CL 100 04/11/2024 0319   CO2 21 (L) 04/11/2024 0319   GLUCOSE 208 (H) 04/11/2024 0319   BUN 29 (H) 04/11/2024 0319   CREATININE 1.15 (H) 04/11/2024 0319   CALCIUM  9.8 04/11/2024 0319   PROT 5.6 (L) 04/09/2024 0712   ALBUMIN 3.2 (L) 04/09/2024 0712   AST 23 04/09/2024 0712   ALT 14 04/09/2024 0712   ALKPHOS 99 04/09/2024 0712   BILITOT 1.0 04/09/2024 0712   GFRNONAA 51 (L) 04/11/2024 0319   GFRAA 5 (L) 10/27/2018 1255   Lipase     Component Value Date/Time   LIPASE 15 04/08/2024 1116    Studies/Results: CT ABDOMEN PELVIS WO CONTRAST Result Date: 04/10/2024 CLINICAL DATA:  Appendicitis, history of lower abdominal pain, renal transplant EXAM: CT ABDOMEN AND PELVIS WITHOUT  CONTRAST TECHNIQUE: Multidetector CT imaging of the abdomen and pelvis was performed following the standard protocol without IV contrast. RADIATION DOSE REDUCTION: This exam was performed according to the departmental dose-optimization program which includes automated exposure control, adjustment of the mA and/or kV according to patient size and/or use of iterative reconstruction technique. COMPARISON:  04/08/2024 FINDINGS: Lower chest: Progressive bibasilar airspace disease, greatest in the right lower lobe. Small bilateral pleural effusions, right greater than left, increased since prior study. Hepatobiliary: Subcapsular hypodensities along the inferior right lobe liver of increased in size, consistent with subcapsular abscesses given adjacent appendicitis. The largest subcapsular abscess measures 4.0 x 3.4 cm reference image 22/2, with a punctate focus of gas again identified. More inferior subcapsular abscess measures 2.3 x 3.1 cm, increased since prior study. Other simple appearing cysts are seen within the more superior liver, stable. Gallbladder is unremarkable. No biliary duct dilation. Pancreas: Unremarkable unenhanced appearance. Spleen: Unremarkable unenhanced appearance. Adrenals/Urinary Tract: Native kidneys are atrophic. 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 Left lower quadrant transplant kidney is unremarkable in appearance with no hydronephrosis or nephrolithiasis. The adrenals and bladder are unremarkable. Stomach/Bowel: Dilated inflamed appendix is again seen extending from the cecum superiorly to the inferior margin of the liver, measuring up to 1.7 cm  in diameter. Multiple appendicoliths are again identified unchanged. No bowel obstruction or ileus. Small hiatal hernia. Distal colonic diverticulosis without diverticulitis. Vascular/Lymphatic: Aortic atherosclerosis. No enlarged abdominal or pelvic lymph nodes. Reproductive: Uterus and bilateral adnexa are unremarkable. Other:  There is trace free fluid within the right upper quadrant surrounding the inflamed appendix. No free intraperitoneal gas. No abdominal wall hernia. Musculoskeletal: No acute or destructive bony abnormalities. Reconstructed images demonstrate no additional findings. IMPRESSION: 1. Persistent findings of acute appendicitis. 2. Enlarging subcapsular fluid collections along the inferior right lobe liver, the largest of which contains a punctate focus of internal gas, consistent with subcapsular hepatic abscesses. 3. Distal colonic diverticulosis without diverticulitis. 4. Progressive bibasilar airspace disease, right greater than left, with small bilateral pleural effusions as above. 5.  Aortic Atherosclerosis (ICD10-I70.0). Electronically Signed   By: Ozell Daring M.D.   On: 04/10/2024 15:27    Anti-infectives: Anti-infectives (From admission, onward)    Start     Dose/Rate Route Frequency Ordered Stop   04/11/24 0900  sulfamethoxazole-trimethoprim (BACTRIM) 400-80 MG per tablet 1 tablet        1 tablet Oral Once per day on Monday Wednesday Friday 04/08/24 2052     04/09/24 1700  azithromycin (ZITHROMAX) 500 mg in sodium chloride  0.9 % 250 mL IVPB        500 mg 250 mL/hr over 60 Minutes Intravenous Every 24 hours 04/08/24 2037     04/09/24 0800  cefTRIAXone (ROCEPHIN) 2 g in sodium chloride  0.9 % 100 mL IVPB        2 g 200 mL/hr over 30 Minutes Intravenous Every 24 hours 04/08/24 1932     04/09/24 0600  metroNIDAZOLE (FLAGYL) IVPB 500 mg        500 mg 100 mL/hr over 60 Minutes Intravenous Every 12 hours 04/08/24 2039     04/08/24 1845  metroNIDAZOLE (FLAGYL) IVPB 500 mg        500 mg 100 mL/hr over 60 Minutes Intravenous  Once 04/08/24 1836 04/09/24 0028   04/08/24 1530  cefTRIAXone (ROCEPHIN) 1 g in sodium chloride  0.9 % 100 mL IVPB        1 g 200 mL/hr over 30 Minutes Intravenous  Once 04/08/24 1525 04/08/24 1700   04/08/24 1530  azithromycin (ZITHROMAX) 500 mg in sodium chloride  0.9 % 250  mL IVPB        500 mg 250 mL/hr over 60 Minutes Intravenous  Once 04/08/24 1525 04/09/24 0027        Assessment/Plan Acute appendicitis - To note, she has a history of renal transplant on Imuran , Prograf  and prednisone  and is currently being treated for pneumonia - Repeat CT 10/19 with persistent findings of acute appendicitis with multiple appendicoliths identified. - CT also notable for enlarging subcapsular fluid collections along the right inferior lobe that may be abscesses.  She does have a history of prior hepatic cysts on ultrasound some years ago. It appears IR consult is pending for possible aspiration.  - Afebrile.  No tachycardia or hypotension.  WBC 13.3 from 12.8. - Cont abx - Reviewed case with MD. Recommend OR for appendectomy and possible drainage of liver cysts. Discussed operative vs non-operative intervention.  I have explained the procedure, risks, and aftercare of Laparoscopic Appendectomy.  Risks include but are not limited to anesthesia (MI, CVA, death, aspiration, prolonged intubation), bleeding, infection, wound problems, hernia, injury to surrounding structures (viscus, nerves, blood vessels, ureter, liver as well as other structures), need for conversion to open procedure  or ileocecectomy/R colectomy, post operative ileus or abscess, stump leak, stump appendicitis and increased risk of DVT/PE. We also discussed she is at increased risk with her being on immunosuppressive medication as well as being treated for a PNA. She seems to understand and agrees to proceed with surgery. Keep NPO. Cont IV abx. I did try to reach out to her kidney coordinator per her request but the number that is provided was disconnected. She is okay with proceeding with the OR.   FEN - NPO, IVF per [facility] VTE - SCDs, okay for chem ppx from a general surgery standpoint ID - Azithro/Rocephin/Flagyl/Bactrim  I reviewed nursing notes, last 24 h vitals and pain scores, last 48 h intake and output, last  24 h labs and trends, and last 24 h imaging results.    LOS: 3 days    Ozell CHRISTELLA Shaper, [facility] 04/11/2024, 10:00 AM Please see Amion for pager number during day hours 7:00am-4:30pm

## 2024-04-11 NOTE — Anesthesia Preprocedure Evaluation (Addendum)
 Anesthesia Evaluation  Patient identified by MRN, date of birth, ID band Patient awake    Reviewed: Allergy & Precautions, H&P , NPO status , Patient's Chart, lab work & pertinent test results, reviewed documented beta blocker date and time   Airway Mallampati: I  TM Distance: >3 FB Neck ROM: Full    Dental no notable dental hx. (+) Edentulous Upper, Partial Lower, Dental Advisory Given   Pulmonary former smoker Quit smoking 1999, 20 pack year history    Pulmonary exam normal breath sounds clear to auscultation       Cardiovascular hypertension (152/69 preop), Pt. on medications and Pt. on home beta blockers + Peripheral Vascular Disease   Rhythm:Regular Rate:Normal     Neuro/Psych  Headaches  negative psych ROS   GI/Hepatic negative GI ROS, Neg liver ROS,,,  Endo/Other  diabetes, Well Controlled, Type 2, Insulin  Dependent    Renal/GU Renal InsufficiencyRenal disease (cr 1.15)S/p kidney transplant 8 years ago  negative genitourinary   Musculoskeletal  (+) Arthritis , Osteoarthritis,    Abdominal   Peds  Hematology  (+) Blood dyscrasia, anemia Hb 11.9, plt 142   Anesthesia Other Findings   Reproductive/Obstetrics negative OB ROS                              Anesthesia Physical Anesthesia Plan  ASA: 3  Anesthesia Plan: General   Post-op Pain Management: Tylenol  PO (pre-op)*   Induction: Intravenous  PONV Risk Score and Plan: 3 and Ondansetron , Dexamethasone , Midazolam  and Treatment may vary due to age or medical condition  Airway Management Planned: Oral ETT  Additional Equipment: None  Intra-op Plan:   Post-operative Plan: Extubation in OR  Informed Consent: I have reviewed the patients History and Physical, chart, labs and discussed the procedure including the risks, benefits and alternatives for the proposed anesthesia with the patient or authorized representative who has  indicated his/her understanding and acceptance.     Dental advisory given  Plan Discussed with: CRNA  Anesthesia Plan Comments:          Anesthesia Quick Evaluation

## 2024-04-11 NOTE — Transfer of Care (Signed)
 Immediate Anesthesia Transfer of Care Note  Patient: Kimberly [name]  Procedure(s) Performed: APPENDECTOMY, LAPAROSCOPIC  Patient Location: PACU  Anesthesia Type:General  Level of Consciousness: awake and patient cooperative  Airway & Oxygen  Therapy: Patient Spontanous Breathing and Patient connected to face mask  Post-op Assessment: Report given to RN and Post -op Vital signs reviewed and stable  Post vital signs: Reviewed and stable  Last Vitals:  Vitals Value Taken Time  BP 161/64 04/11/24 15:48  Temp    Pulse 78 04/11/24 15:51  Resp 24 04/11/24 15:51  SpO2 90 % 04/11/24 15:51  Vitals shown include unfiled device data.  Last Pain:  Vitals:   04/11/24 1322  TempSrc:   PainSc: 5       Patients Stated Pain Goal: 0 (04/11/24 9374)  Complications: No notable events documented.

## 2024-04-11 NOTE — Progress Notes (Signed)
 PROGRESS NOTE    Kimberly [name]  FMW:[id number] DOB: September 06, 1955 DOA: 04/08/2024 PCP: Loreli Elsie [name], MD   Brief Narrative:  Kimberly [name] is a 68 y.o. female with medical history significant of T2DM,  HTN, HLD, hx of hyperthyroidism, hx of kidney transplant s/p DDKT 11/2020 who presented to ED with abdominal pain -imaging concerning for appendicitis w/ microperforation -hospitalist called for admission, general surgery, consult.   Assessment & Plan:   Principal Problem:   Acute appendicitis Active Problems:   CAP (community acquired pneumonia)   Acute renal failure superimposed on stage 3b chronic kidney disease (HCC)   History of kidney transplant   Diabetes mellitus type 2, insulin  dependent (HCC)   Essential (primary) hypertension   Hyperlipidemia  Severe sepsis with lactic acidosis, multifactorial, POA - Appendicitis, liver abscess, pneumonia as below  Acute appendicitis with microperforation Liver abscess versus cyst, POA -Criteria met with leukocytosis, tachypnea notable source with elevated lactic acid/AKI -Pneumonia unlikely given lack of symptoms but will continue treatment as below for coverage -Continue azithromycin, ceftriaxone, Flagyl, (on Bactrim three times per week at baseline) -Pending cultures will likely involve infectious disease versus IR, unclear if there are options for drainage or fluid analysis -General Surgery following, appreciate insight recommendations - Repeat imaging notable for persistent findings of acute appendicitis with enlarging subcapsular fluid collection inferior to the liver.  General surgery planning for procedure later this afternoon.   Pneumonia, community-acquired, POA -Hx of immunosuppression secondary to renal transplant  -No symptoms or complaints at this time, continue to follow, broad-spectrum antibiotics as above   Acute renal failure superimposed on stage 3b chronic kidney disease (HCC) - Baseline creatinine  1.4 - Likely prerenal exacerbated by poor p.o. intake nausea vomiting hypotension and sepsis - Continue IV fluids as appropriate, advance diet as tolerated   History of kidney transplant s/p DDKT 11/29/2020  Continue Prograf , prednisone , Imuran , Bactrim(three times per week)   Diabetes mellitus type 2, insulin  dependent (HCC) -Uncontrolled with hyperglycemia -Continue long acting insulin   -Continue sliding scale insulin , hypoglycemic protocol   Essential (primary) hypertension Medications held given initially hypotensive, as blood pressure improves restart home medications, currently on labetalol , holding home amlodipine   Hyperlipidemia Continue simvastatin , Zetia    DVT prophylaxis: SCDs Start: 04/08/24 2040 Code Status:   Code Status: Full Code Family Communication: Husband at bedside  Status is: Inpatient  Dispo: The patient is from: Home              Anticipated d/c is to: Home              Anticipated d/c date is: 72+ hours              Patient currently not medically stable for discharge  Consultants:  General surgery  Procedures:  None planned  Antimicrobials:  Azithromycin, ceftriaxone, Flagyl - Chronically on Bactrim 3 times weekly  Subjective: No acute issues or events overnight denies nausea vomiting diarrhea constipation headache fevers chills or chest pain.  Abdominal discomfort ongoing but markedly improved on current regimen  Objective: Vitals:   04/10/24 1505 04/10/24 2135 04/10/24 2200 04/11/24 0528  BP: 129/71 139/75  (!) 152/69  Pulse: 85 76  91  Resp: 20 18  20   Temp: 98.7 F (37.1 C) 98.5 F (36.9 C)  99.2 F (37.3 C)  TempSrc: Oral Oral  Oral  SpO2: 91% (!) 89% 93% 92%  Weight:      Height:        Intake/Output Summary (Last  24 hours) at 04/11/2024 0801 Last data filed at 04/10/2024 2300 Gross per 24 hour  Intake 1390 ml  Output --  Net 1390 ml   Filed Weights   04/08/24 1105 04/08/24 1739 04/09/24 0437  Weight: 70.3 kg 66.2 kg  69.2 kg    Examination:  General:  Pleasantly resting in bed, No acute distress. HEENT:  Normocephalic atraumatic.  Sclerae nonicteric, noninjected.  Extraocular movements intact bilaterally. Neck:  Without mass or deformity.  Trachea is midline. Lungs:  Clear to auscultate bilaterally without rhonchi, wheeze, or rales. Heart:  Regular rate and rhythm.  Without murmurs, rubs, or gallops. Abdomen:  Soft, moderately tender, PMI at right lower quadrant without guarding or rebound. Extremities: Without cyanosis, clubbing, edema, or obvious deformity. Skin:  Warm and dry, no erythema.   Data Reviewed: I have personally reviewed following labs and imaging studies  CBC: Recent Labs  Lab 04/08/24 1116 04/09/24 0712 04/10/24 0330 04/11/24 0319  WBC 14.5* 14.5* 12.8* 13.3*  HGB 12.8 12.4 12.2 11.9*  HCT 39.6 37.1 36.8 35.6*  MCV 93.0 91.2 92.9 92.5  PLT 135* 125* 126* 142*   Basic Metabolic Panel: Recent Labs  Lab 04/08/24 1116 04/09/24 0712 04/11/24 0319  NA 130* 130* 133*  K 3.9 3.4* 3.5  CL 95* 97* 100  CO2 21* 20* 21*  GLUCOSE 234* 162* 208*  BUN 29* 38* 29*  CREATININE 1.72* 1.59* 1.15*  CALCIUM  9.7 9.6 9.8   GFR: Estimated Creatinine Clearance: 45.1 mL/min (A) (by C-G formula based on SCr of 1.15 mg/dL (H)). Liver Function Tests: Recent Labs  Lab 04/08/24 1116 04/09/24 0712  AST 19 23  ALT 14 14  ALKPHOS 87 99  BILITOT 1.4* 1.0  PROT 5.8* 5.6*  ALBUMIN 3.6 3.2*   Recent Labs  Lab 04/08/24 1116  LIPASE 15   No results for input(s): AMMONIA in the last 168 hours. Coagulation Profile: Recent Labs  Lab 04/08/24 1532  INR 1.2   Cardiac Enzymes: No results for input(s): CKTOTAL, CKMB, CKMBINDEX, TROPONINI in the last 168 hours. BNP (last 3 results) No results for input(s): PROBNP in the last 8760 hours. HbA1C: No results for input(s): HGBA1C in the last 72 hours. CBG: Recent Labs  Lab 04/10/24 0746 04/10/24 1142 04/10/24 1602  04/10/24 2133 04/11/24 0723  GLUCAP 172* 211* 169* 154* 178*   Lipid Profile: No results for input(s): CHOL, HDL, LDLCALC, TRIG, CHOLHDL, LDLDIRECT in the last 72 hours. Thyroid  Function Tests: No results for input(s): TSH, T4TOTAL, FREET4, T3FREE, THYROIDAB in the last 72 hours. Anemia Panel: No results for input(s): VITAMINB12, FOLATE, FERRITIN, TIBC, IRON, RETICCTPCT in the last 72 hours. Sepsis Labs: Recent Labs  Lab 04/08/24 2009 04/08/24 2016 04/09/24 0712  PROCALCITON  --  38.90 32.60  LATICACIDVEN 2.0*  --   --     Recent Results (from the past 240 hours)  Blood Culture (routine x 2)     Status: None (Preliminary result)   Collection Time: 04/08/24  3:32 PM   Specimen: BLOOD RIGHT ARM  Result Value Ref Range Status   Specimen Description   Final    BLOOD RIGHT ARM BOTTLES DRAWN AEROBIC AND ANAEROBIC Performed at [facility], [address] [place], [place] [address]    Special Requests   Final    Blood Culture adequate volume Performed at [facility], [address] [place], [place] [address]    Culture   Final    NO GROWTH 3 DAYS Performed  at [facility], [address], [place], [place] [address]    Report Status PENDING  Incomplete  Resp panel by RT-PCR (RSV, Flu A&B, Covid) Anterior Nasal Swab     Status: None   Collection Time: 04/09/24  1:20 AM   Specimen: Anterior Nasal Swab  Result Value Ref Range Status   SARS Coronavirus 2 by RT PCR NEGATIVE NEGATIVE Final    Comment: (NOTE) SARS-CoV-2 target nucleic acids are NOT DETECTED.  The SARS-CoV-2 RNA is generally detectable in upper respiratory specimens during the acute phase of infection. The lowest concentration of SARS-CoV-2 viral copies this assay can detect is 138 copies/mL. A negative result does not preclude SARS-Cov-2 infection and should not be used as the sole basis for treatment or other patient management  decisions. A negative result may occur with  improper specimen collection/handling, submission of specimen other than nasopharyngeal swab, presence of viral mutation(s) within the areas targeted by this assay, and inadequate number of viral copies(<138 copies/mL). A negative result must be combined with clinical observations, patient history, and epidemiological information. The expected result is Negative.  Fact Sheet for Patients:  BloggerCourse.com  Fact Sheet for Healthcare Providers:  SeriousBroker.it  This test is no t yet approved or cleared by the [place]  FDA and  has been authorized for detection and/or diagnosis of SARS-CoV-2 by FDA under an Emergency Use Authorization (EUA). This EUA will remain  in effect (meaning this test can be used) for the duration of the COVID-19 declaration under Section 564(b)(1) of the Act, 21 U.S.C.section 360bbb-3(b)(1), unless the authorization is terminated  or revoked sooner.       Influenza A by PCR NEGATIVE NEGATIVE Final   Influenza B by PCR NEGATIVE NEGATIVE Final    Comment: (NOTE) The Xpert Xpress SARS-CoV-2/FLU/RSV plus assay is intended as an aid in the diagnosis of influenza from Nasopharyngeal swab specimens and should not be used as a sole basis for treatment. Nasal washings and aspirates are unacceptable for Xpert Xpress SARS-CoV-2/FLU/RSV testing.  Fact Sheet for Patients: BloggerCourse.com  Fact Sheet for Healthcare Providers: SeriousBroker.it  This test is not yet approved or cleared by the [place]  FDA and has been authorized for detection and/or diagnosis of SARS-CoV-2 by FDA under an Emergency Use Authorization (EUA). This EUA will remain in effect (meaning this test can be used) for the duration of the COVID-19 declaration under Section 564(b)(1) of the Act, 21 U.S.C. section 360bbb-3(b)(1), unless the  authorization is terminated or revoked.     Resp Syncytial Virus by PCR NEGATIVE NEGATIVE Final    Comment: (NOTE) Fact Sheet for Patients: BloggerCourse.com  Fact Sheet for Healthcare Providers: SeriousBroker.it  This test is not yet approved or cleared by the [place]  FDA and has been authorized for detection and/or diagnosis of SARS-CoV-2 by FDA under an Emergency Use Authorization (EUA). This EUA will remain in effect (meaning this test can be used) for the duration of the COVID-19 declaration under Section 564(b)(1) of the Act, 21 U.S.C. section 360bbb-3(b)(1), unless the authorization is terminated or revoked.  Performed at [facility], [address] [place], [place] [address]          Radiology Studies:  ADDENDUM #1  EXAM: CT ABDOMEN AND PELVIS WITHOUT CONTRAST 04/08/2024 05:36:04 PM TECHNIQUE: CT of the abdomen and pelvis was performed without the administration of intravenous contrast. Multiplanar reformatted images are provided for review. Automated exposure control, iterative reconstruction, and/or weight-based adjustment of the mA/kV was utilized  to reduce the radiation dose to as low as reasonably achievable. COMPARISON: Ultrasound of 02/11/2016. No prior CT. CLINICAL HISTORY: lower abdominal pain, prior renal transplant. FINDINGS: LOWER CHEST: Tiny right pleural effusion. Right greater than left lung base airspace disease. Moderate cardiomegaly. Small hiatal hernia. Coronary artery calcification. LIVER: Hepatic cysts of up to 1.8 cm. Low density capsular-based right hepatic lobe lesions of up to 2.1 x 2.5 cm (series 26, image 2). Suspect subtle gas adjacent the right hepatic capsule on image 30/2, just cephalad to the appendiceal tip. GALLBLADDER AND BILE DUCTS: Gallbladder is unremarkable. No biliary ductal dilatation. SPLEEN: No acute abnormality. PANCREAS: Mild pancreatic atrophy. ADRENAL  GLANDS: No acute abnormality. KIDNEYS, URETERS AND BLADDER: Bilateral native kidneys demonstrate moderate to marked atrophy. Upper pole right native renal artery 1.1 cm fluid density lesion is likely a cyst and does not warrant specific imaging follow-up. Lower pole left native renal artery 1.9 cm low-density lesion is also likely a cyst and does not warrant imaging follow-up. Negative left native renal artery punctate stone versus renal vascular calcification. Atrophic right iliac fossa renal transplant with presumed vascular calcifications. Left iliac fossa renal transplant without hydronephrosis or surrounding fluid collection. No bladder calculi. No hydronephrosis of the native kidneys. . Urinary bladder is unremarkable. GI AND BOWEL: Apparent gastric body wall thickening is likely due to underdistention. Periampullary duodenal diverticulum. Scattered colonic diverticula. Normal terminal ileum. The appendix originates on image 51/2 with multiple appendicoliths within including on image 49/2. The appendix is dilated at up to 1.8 cm on image 45/2 with moderate surrounding edema. There is no bowel obstruction. PERITONEUM AND RETROPERITONEUM: No ascites. No free air. VASCULATURE: Aorta is normal in caliber. Advanced aortic atherosclerosis. LYMPH NODES: No lymphadenopathy. REPRODUCTIVE ORGANS: Suspected posterior uterine fundal partially calcified 11 mm fibroid. No adnexal mass. BONES AND SOFT TISSUES: Prominent thoracolumbar spondylosis. Eccentric right disc bulge at L3-L4 including on sagittal image 102/9. Mild pelvic floor laxity. No acute osseous abnormality. No focal soft tissue abnormality. IMPRESSION: 1. Findings highly suspicious for acute appendicitis with microperforation and extraluminal gas adjacent to the right hepatic lobe. 2. Capsular-based hypoattenuating liver lesions are indeterminate on this noncontrast CT but are suspicious for hepatic abscesses, given proximity to the inflammatory process. 3.  Bibasilar airspace disease with trace right pleural effusion; although this may reflect atelectasis, infectionparticularly at the right lung baseis a concern. 4. Probable small uterine fundal fibroid. 5. Large L3-L4 eccentric right disc bulge. 6. Cardiomegaly. Aortic atherosclerosis (ICD-10 I70.0). Coronary artery atherosclerosis. Case discussed with Dr rapp at 6:45 pm. Electronically signed by: Rockey Kilts MD 04/08/2024 06:52 PM EDT RP Workstation: [id number]   Result Date: 04/08/2024  ORIGINAL REPORT  EXAM: CT ABDOMEN AND PELVIS WITHOUT CONTRAST 04/08/2024 05:36:04 PM TECHNIQUE: CT of the abdomen and pelvis was performed without the administration of intravenous contrast. Multiplanar reformatted images are provided for review. Automated exposure control, iterative reconstruction, and/or weight-based adjustment of the mA/kV was utilized to reduce the radiation dose to as low as reasonably achievable. COMPARISON: Ultrasound of 02/11/2016. No prior CT. CLINICAL HISTORY: lower abdominal pain, prior renal transplant. FINDINGS: LOWER CHEST: Tiny right pleural effusion. Right greater than left lung base airspace disease. Moderate cardiomegaly. Small hiatal hernia. Coronary artery calcification. LIVER: Hepatic cysts of up to 1.8 cm. Low density capsular-based right hepatic lobe lesions of up to 2.1 x 2.5 cm (series 26, image 2). Suspect subtle gas adjacent the right hepatic capsule on image 30/2, just cephalad to the appendiceal tip. GALLBLADDER AND BILE DUCTS: Gallbladder  is unremarkable. No biliary ductal dilatation. SPLEEN: No acute abnormality. PANCREAS: Mild pancreatic atrophy. ADRENAL GLANDS: No acute abnormality. KIDNEYS, URETERS AND BLADDER: Bilateral native kidneys demonstrate moderate to marked atrophy. Upper pole right native renal artery 1.1 cm fluid density lesion is likely a cyst and does not warrant specific imaging follow-up. Lower pole left native renal artery 1.9 cm low-density lesion is also likely  a cyst and does not warrant imaging follow-up. Negative left native renal artery punctate stone versus renal vascular calcification. Atrophic right iliac fossa renal transplant with presumed vascular calcifications. Left iliac fossa renal transplant without hydronephrosis or surrounding fluid collection. No bladder calculi. No hydronephrosis of the native kidneys. . Urinary bladder is unremarkable. GI AND BOWEL: Apparent gastric body wall thickening is likely due to underdistention. Periampullary duodenal diverticulum. Scattered colonic diverticula. Normal terminal ileum. The appendix originates on image 51/2 with multiple appendicoliths within including on image 49/2. The appendix is dilated at up to 1.8 cm on image 45/2 with moderate surrounding edema. There is no bowel obstruction. PERITONEUM AND RETROPERITONEUM: No ascites. No free air. VASCULATURE: Aorta is normal in caliber. Advanced aortic atherosclerosis. LYMPH NODES: No lymphadenopathy. REPRODUCTIVE ORGANS: Suspected posterior uterine fundal partially calcified 11 mm fibroid. No adnexal mass. BONES AND SOFT TISSUES: Prominent thoracolumbar spondylosis. Eccentric right disc bulge at L3-L4 including on sagittal image 102/9. Mild pelvic floor laxity. No acute osseous abnormality. No focal soft tissue abnormality. IMPRESSION: 1. Findings highly suspicious for acute appendicitis with microperforation and extraluminal gas adjacent to the right hepatic lobe. 2. Capsular-based hypoattenuating liver lesions are indeterminate on this noncontrast CT but are suspicious for hepatic abscesses, given proximity to the inflammatory process. 3. Bibasilar airspace disease with trace right pleural effusion; although this may reflect atelectasis, infectionparticularly at the right lung baseis a concern. 4. Probable small uterine fundal fibroid. 5. Large L3-L4 eccentric right disc bulge. 6. Cardiomegaly. Aortic atherosclerosis (ICD-10 I70.0). Coronary artery atherosclerosis. A  return call from the clinical service is pending. Electronically signed by: Rockey Kilts MD 04/08/2024 06:21 PM EDT RP Workstation: [id number]   DG Chest 2 View Result Date: 04/08/2024 EXAM: 2 VIEW(S) XRAY OF THE CHEST 04/08/2024 11:53:51 AM COMPARISON: 05/05/220 CLINICAL HISTORY: ? PNA. Per chart: Pt presents after being sent from PCP for dx of pneumonia with suspicion for aspiration pneumonia. Pt also has right lower quadrant pain they felt was suspicous for appendicitis. Initially had n/v but not today. FINDINGS: LUNGS AND PLEURA: Increased bibasilar opacities are noted concerning for worsening atelectasis or pneumonia. Small right pleural effusion is noted. No pulmonary edema. No pneumothorax. HEART AND MEDIASTINUM: Stable cardiomediastinal silhouette. BONES AND SOFT TISSUES: No acute osseous abnormality. IMPRESSION: 1. Increased bibasilar opacities, which may reflect worsening atelectasis or pneumonia. 2. Small right pleural effusion. Electronically signed by: Lynwood Seip MD 04/08/2024 12:23 PM EDT RP Workstation: [id number]      Scheduled Meds:  azaTHIOprine   25 mg Oral Daily   ezetimibe   10 mg Oral QHS   feeding supplement  1 Container Oral TID BM   insulin  aspart  0-9 Units Subcutaneous TID WC   insulin  glargine-yfgn  16 Units Subcutaneous QPM   labetalol   200 mg Oral BID   magnesium  oxide  400 mg Oral BID   pantoprazole  (PROTONIX ) IV  40 mg Intravenous Q24H   predniSONE   5 mg Oral Q breakfast   simvastatin   20 mg Oral QHS   sulfamethoxazole-trimethoprim  1 tablet Oral Once per day on Monday Wednesday Friday   tacrolimus   4 mg  Oral BID   Continuous Infusions:  azithromycin 500 mg (04/10/24 1743)   cefTRIAXone (ROCEPHIN)  IV 2 g (04/10/24 0920)   metronidazole 500 mg (04/10/24 2300)     LOS: 3 days    Time spent:  Elsie JAYSON Montclair, DO [facility]  If 7PM-7AM, please contact night-coverage [email or web address]  04/11/2024, 8:01 AM

## 2024-04-11 NOTE — Anesthesia Procedure Notes (Signed)
 Procedure Name: Intubation Date/Time: 04/11/2024 2:27 PM  Performed by: Judythe Tanda Aran, CRNAPre-anesthesia Checklist: Patient identified, Emergency Drugs available, Suction available and Patient being monitored Patient Re-evaluated:Patient Re-evaluated prior to induction Oxygen  Delivery Method: Circle system utilized Preoxygenation: Pre-oxygenation with 100% oxygen  Induction Type: IV induction Ventilation: Mask ventilation without difficulty Laryngoscope Size: 2 and Miller Grade View: Grade I Tube type: Oral Tube size: 7.0 mm Number of attempts: 1 Airway Equipment and Method: Stylet Placement Confirmation: ETT inserted through vocal cords under direct vision, positive ETCO2 and breath sounds checked- equal and bilateral Secured at: 21 cm Tube secured with: Tape Dental Injury: Teeth and Oropharynx as per pre-operative assessment

## 2024-04-12 ENCOUNTER — Encounter (HOSPITAL_COMMUNITY): Payer: Self-pay | Admitting: Surgery

## 2024-04-12 DIAGNOSIS — J189 Pneumonia, unspecified organism: Secondary | ICD-10-CM | POA: Diagnosis not present

## 2024-04-12 DIAGNOSIS — Z940 Kidney transplant status: Secondary | ICD-10-CM | POA: Diagnosis not present

## 2024-04-12 DIAGNOSIS — K353 Acute appendicitis with localized peritonitis, without perforation or gangrene: Secondary | ICD-10-CM | POA: Diagnosis not present

## 2024-04-12 DIAGNOSIS — N179 Acute kidney failure, unspecified: Secondary | ICD-10-CM | POA: Diagnosis not present

## 2024-04-12 LAB — BASIC METABOLIC PANEL WITH GFR
Anion gap: 11 (ref 5–15)
BUN: 31 mg/dL — ABNORMAL HIGH (ref 8–23)
CO2: 21 mmol/L — ABNORMAL LOW (ref 22–32)
Calcium: 9.8 mg/dL (ref 8.9–10.3)
Chloride: 104 mmol/L (ref 98–111)
Creatinine, Ser: 1.14 mg/dL — ABNORMAL HIGH (ref 0.44–1.00)
GFR, Estimated: 52 mL/min — ABNORMAL LOW (ref >60)
Glucose, Bld: 254 mg/dL — ABNORMAL HIGH (ref 70–99)
Potassium: 4.5 mmol/L (ref 3.5–5.1)
Sodium: 136 mmol/L (ref 135–145)

## 2024-04-12 LAB — CBC
HCT: 37.2 % (ref 36.0–46.0)
Hemoglobin: 11.9 g/dL — ABNORMAL LOW (ref 12.0–15.0)
MCH: 29.8 pg (ref 26.0–34.0)
MCHC: 32 g/dL (ref 30.0–36.0)
MCV: 93.2 fL (ref 80.0–100.0)
Platelets: 154 K/uL (ref 150–400)
RBC: 3.99 MIL/uL (ref 3.87–5.11)
RDW: 13.2 % (ref 11.5–15.5)
WBC: 9.3 K/uL (ref 4.0–10.5)
nRBC: 0 % (ref 0.0–0.2)

## 2024-04-12 LAB — LEGIONELLA PNEUMOPHILA SEROGP 1 UR AG: L. pneumophila Serogp 1 Ur Ag: NEGATIVE

## 2024-04-12 LAB — GLUCOSE, CAPILLARY
Glucose-Capillary: 261 mg/dL — ABNORMAL HIGH (ref 70–99)
Glucose-Capillary: 374 mg/dL — ABNORMAL HIGH (ref 70–99)
Glucose-Capillary: 196 mg/dL — ABNORMAL HIGH (ref 70–99)
Glucose-Capillary: 274 mg/dL — ABNORMAL HIGH (ref 70–99)

## 2024-04-12 MED ORDER — METRONIDAZOLE 500 MG/100ML IV SOLN
500.0000 mg | Freq: Two times a day (BID) | INTRAVENOUS | Status: DC
  Administered 2024-04-12 – 2024-04-13 (×2): 500 mg via INTRAVENOUS
  Filled 2024-04-12 (×2): qty 100

## 2024-04-12 MED ORDER — ACETAMINOPHEN 325 MG PO TABS
650.0000 mg | ORAL_TABLET | Freq: Four times a day (QID) | ORAL | Status: DC | PRN
  Administered 2024-04-12 – 2024-04-13 (×4): 650 mg via ORAL
  Filled 2024-04-12 (×4): qty 2

## 2024-04-12 MED ORDER — INSULIN GLARGINE-YFGN 100 UNIT/ML ~~LOC~~ SOLN
20.0000 [IU] | Freq: Every evening | SUBCUTANEOUS | Status: DC
  Administered 2024-04-12: 20 [IU] via SUBCUTANEOUS
  Filled 2024-04-12 (×2): qty 0.2

## 2024-04-12 MED ORDER — SODIUM CHLORIDE 0.9 % IV SOLN
2.0000 g | INTRAVENOUS | Status: DC
  Administered 2024-04-13: 2 g via INTRAVENOUS
  Filled 2024-04-12: qty 20

## 2024-04-12 NOTE — Evaluation (Signed)
 Physical Therapy Evaluation Patient Details Name: Kimberly [name] MRN: [id number] DOB: 11/26/1955 Today's Date: 04/12/2024  History of Present Illness  68 yo female presents to therapy following hospital admission on 04/08/2024 due ruptured appendicitis and is now s/p laparoscopic appendectomy 10/20.  Pt was also found to have CAP.  Pt PMH includes but is not limited to: DM II, HTN, HLD, hyperthyroidism, CKD IIIb, kidney transplant (2010), gout, anemia and R RTC tear.  Clinical Impression      Pt admitted with above diagnosis.  Pt currently with functional limitations due to the deficits listed below (see PT Problem List). Pt seated in recliner when PT arrived. Spouse present. Nursing exiting room. MD arrived during a portion of PT eval. Pt is to be upgraded with diet and hopefully d/c home today or tomorrow am. Pt is motivated to participate with therapy. Pt is mod I for transfer tasks with increased time and no cues for AD from a variety of surfaces, static standing no AD for ~ 10 mins performing ADL tasks at sink no overt LOB mild SOB reported, gait tasks in room and in hallway (500 feet) no AD S no overt LOB and intermittent use of handrail in hallway for support pt indicated increased abdominal pain on R side and SOB with pt 93% on RA and PR 75. Pt left seated in recliner, all needs in place, spouse present. Pt will benefit from acute skilled PT to increase their independence and safety with mobility to allow discharge.       If plan is discharge home, recommend the following: Assistance with cooking/housework;Assist for transportation;Help with stairs or ramp for entrance   Can travel by private vehicle        Equipment Recommendations None recommended by PT  Recommendations for Other Services       Functional Status Assessment Patient has had a recent decline in their functional status and demonstrates the ability to make significant improvements in function in a reasonable and  predictable amount of time.     Precautions / Restrictions Precautions Precautions: Fall Restrictions Weight Bearing Restrictions Per Provider Order: No      Mobility  Bed Mobility               General bed mobility comments: pt seated in recliner when PT arrived    Transfers Overall transfer level: Modified independent                 General transfer comment: increased time no cues for bed, recliner and commode transfers    Ambulation/Gait Ambulation/Gait assistance: Supervision Gait Distance (Feet): 500 Feet Assistive device: None Gait Pattern/deviations: Step-through pattern Gait velocity: WFL     General Gait Details: occational use of handrail in hallway for support, min cues for safety, direction, coordinated breathing and maintaining cadence. no overt LOB with pt reporting mild SOB s/p prolonged gait and O2 saturaiton 93% on RA and PR 75  Stairs            Wheelchair Mobility     Tilt Bed    Modified Rankin (Stroke Patients Only)       Balance Overall balance assessment: No apparent balance deficits (not formally assessed)                                           Pertinent Vitals/Pain Pain Assessment Pain Assessment: 0-10 Pain Score: 4  Pain Location: R abdomen Pain Descriptors / Indicators: Aching, Constant, Discomfort, Dull, Guarding Pain Intervention(s): Limited activity within patient's tolerance, Monitored during session, Repositioned, Patient requesting pain meds-RN notified    Home Living Family/patient expects to be discharged to:: Private residence Living Arrangements: Spouse/significant other Available Help at Discharge: Family Type of Home: House Home Access: Stairs to enter Entrance Stairs-Rails: None Entrance Stairs-Number of Steps: 2 Alternate Level Stairs-Number of Steps: flight Home Layout: Multi-level;Bed/bath upstairs;Able to live on main level with bedroom/bathroom (guest bedroom on  first) Home Equipment: None      Prior Function Prior Level of Function : Independent/Modified Independent             Mobility Comments: IND no AD for all ADLs, self care tasks and IADLs       Extremity/Trunk Assessment        Lower Extremity Assessment Lower Extremity Assessment: Overall WFL for tasks assessed    Cervical / Trunk Assessment Cervical / Trunk Assessment: Normal  Communication   Communication Communication: No apparent difficulties    Cognition Arousal: Alert Behavior During Therapy: WFL for tasks assessed/performed   PT - Cognitive impairments: No apparent impairments                         Following commands: Intact       Cueing       General Comments      Exercises     Assessment/Plan    PT Assessment Patient needs continued PT services  PT Problem List Decreased activity tolerance;Pain       PT Treatment Interventions Stair training;Functional mobility training;Therapeutic activities;Therapeutic exercise;Balance training;Neuromuscular re-education;Patient/family education    PT Goals (Current goals can be found in the Care Plan section)  Acute Rehab PT Goals Patient Stated Goal: to be able to go home today or tomorrow PT Goal Formulation: With patient Time For Goal Achievement: 04/19/24 Potential to Achieve Goals: Good    Frequency Min 3X/week     Co-evaluation               AM-PAC PT 6 Clicks Mobility  Outcome Measure Help needed turning from your back to your side while in a flat bed without using bedrails?: None Help needed moving from lying on your back to sitting on the side of a flat bed without using bedrails?: None Help needed moving to and from a bed to a chair (including a wheelchair)?: None Help needed standing up from a chair using your arms (e.g., wheelchair or bedside chair)?: None Help needed to walk in hospital room?: A Little Help needed climbing 3-5 steps with a railing? : A  Little 6 Click Score: 22    End of Session Equipment Utilized During Treatment: Gait belt Activity Tolerance: Patient tolerated treatment well Patient left: in chair;with call bell/phone within reach;with chair alarm set;with family/visitor present Nurse Communication: Mobility status (pt asked nurse for tylenol ) PT Visit Diagnosis: Difficulty in walking, not elsewhere classified (R26.2);Pain Pain - part of body:  (abdomen)    Time: 8983-8945 PT Time Calculation (min) (ACUTE ONLY): 38 min   Charges:   PT Evaluation $PT Eval Low Complexity: 1 Low PT Treatments $Gait Training: 8-22 mins $Therapeutic Activity: 8-22 mins PT General Charges $$ ACUTE PT VISIT: 1 Visit         [place], PT Acute Rehab   [place] VEAR Drone 04/12/2024, 11:08 AM

## 2024-04-12 NOTE — Progress Notes (Addendum)
 1 Day Post-Op  Subjective: CC: Sore around her incisions. Tolerating cld without n/v. Passing flatus. BM since surgery. Voiding. Up to the chair. Otherwise has not ambulated. Asking about PT.   Afebrile. No tachycardia or hypotension. Weaned to RA. WBC wnl. Hgb stable. Cr stable.   Objective: Vital signs in last 24 hours: Temp:  [97.8 F (36.6 C)-98.6 F (37 C)] 98.5 F (36.9 C) (10/21 0425) Pulse Rate:  [71-82] 74 (10/21 0425) Resp:  [16-33] 18 (10/21 0425) BP: (132-161)/(57-68) 150/68 (10/21 0425) SpO2:  [88 %-93 %] 92 % (10/21 0425) Weight:  [69.2 kg] 69.2 kg (10/20 1322) Last BM Date : 04/11/24  Intake/Output from previous day: 10/20 0701 - 10/21 0700 In: 748.4 [P.O.:300; IV Piggyback:448.4] Out: 40 [Drains:20; Blood:20] Intake/Output this shift: No intake/output data recorded.  PE: Gen:  Alert, NAD, pleasant Abd: Soft, mild distension, appropriately tender around laparoscopic incisions, no rigidity or guarding and otherwise NT, +BS. Incisions with glue intact appears well and are without drainage, bleeding, or signs of infection. JP drain SS - 20cc/24 hours  Lab Results:  Recent Labs    04/11/24 0319 04/12/24 0419  WBC 13.3* 9.3  HGB 11.9* 11.9*  HCT 35.6* 37.2  PLT 142* 154   BMET Recent Labs    04/11/24 0319 04/12/24 0419  NA 133* 136  K 3.5 4.5  CL 100 104  CO2 21* 21*  GLUCOSE 208* 254*  BUN 29* 31*  CREATININE 1.15* 1.14*  CALCIUM  9.8 9.8   PT/INR No results for input(s): LABPROT, INR in the last 72 hours. CMP     Component Value Date/Time   NA 136 04/12/2024 0419   K 4.5 04/12/2024 0419   CL 104 04/12/2024 0419   CO2 21 (L) 04/12/2024 0419   GLUCOSE 254 (H) 04/12/2024 0419   BUN 31 (H) 04/12/2024 0419   CREATININE 1.14 (H) 04/12/2024 0419   CALCIUM  9.8 04/12/2024 0419   PROT 5.6 (L) 04/09/2024 0712   ALBUMIN 3.2 (L) 04/09/2024 0712   AST 23 04/09/2024 0712   ALT 14 04/09/2024 0712   ALKPHOS 99 04/09/2024 0712   BILITOT 1.0  04/09/2024 0712   GFRNONAA 52 (L) 04/12/2024 0419   GFRAA 5 (L) 10/27/2018 1255   Lipase     Component Value Date/Time   LIPASE 15 04/08/2024 1116    Studies/Results: CT ABDOMEN PELVIS WO CONTRAST Result Date: 04/10/2024 CLINICAL DATA:  Appendicitis, history of lower abdominal pain, renal transplant EXAM: CT ABDOMEN AND PELVIS WITHOUT CONTRAST TECHNIQUE: Multidetector CT imaging of the abdomen and pelvis was performed following the standard protocol without IV contrast. RADIATION DOSE REDUCTION: This exam was performed according to the departmental dose-optimization program which includes automated exposure control, adjustment of the mA and/or kV according to patient size and/or use of iterative reconstruction technique. COMPARISON:  04/08/2024 FINDINGS: Lower chest: Progressive bibasilar airspace disease, greatest in the right lower lobe. Small bilateral pleural effusions, right greater than left, increased since prior study. Hepatobiliary: Subcapsular hypodensities along the inferior right lobe liver of increased in size, consistent with subcapsular abscesses given adjacent appendicitis. The largest subcapsular abscess measures 4.0 x 3.4 cm reference image 22/2, with a punctate focus of gas again identified. More inferior subcapsular abscess measures 2.3 x 3.1 cm, increased since prior study. Other simple appearing cysts are seen within the more superior liver, stable. Gallbladder is unremarkable. No biliary duct dilation. Pancreas: Unremarkable unenhanced appearance. Spleen: Unremarkable unenhanced appearance. Adrenals/Urinary Tract: Native kidneys are atrophic. Failed right lower quadrant  transplant kidney again noted. Left lower quadrant transplant kidney is unremarkable in appearance with no hydronephrosis or nephrolithiasis. The adrenals and bladder are unremarkable. Stomach/Bowel: Dilated inflamed appendix is again seen extending from the cecum superiorly to the inferior margin of the liver,  measuring up to 1.7 cm in diameter. Multiple appendicoliths are again identified unchanged. No bowel obstruction or ileus. Small hiatal hernia. Distal colonic diverticulosis without diverticulitis. Vascular/Lymphatic: Aortic atherosclerosis. No enlarged abdominal or pelvic lymph nodes. Reproductive: Uterus and bilateral adnexa are unremarkable. Other: There is trace free fluid within the right upper quadrant surrounding the inflamed appendix. No free intraperitoneal gas. No abdominal wall hernia. Musculoskeletal: No acute or destructive bony abnormalities. Reconstructed images demonstrate no additional findings. IMPRESSION: 1. Persistent findings of acute appendicitis. 2. Enlarging subcapsular fluid collections along the inferior right lobe liver, the largest of which contains a punctate focus of internal gas, consistent with subcapsular hepatic abscesses. 3. Distal colonic diverticulosis without diverticulitis. 4. Progressive bibasilar airspace disease, right greater than left, with small bilateral pleural effusions as above. 5.  Aortic Atherosclerosis (ICD10-I70.0). Electronically Signed   By: Ozell Daring M.D.   On: 04/10/2024 15:27    Anti-infectives: Anti-infectives (From admission, onward)    Start     Dose/Rate Route Frequency Ordered Stop   04/11/24 0900  sulfamethoxazole-trimethoprim (BACTRIM) 400-80 MG per tablet 1 tablet        1 tablet Oral Once per day on Monday Wednesday Friday 04/08/24 2052     04/09/24 1700  azithromycin (ZITHROMAX) 500 mg in sodium chloride  0.9 % 250 mL IVPB        500 mg 250 mL/hr over 60 Minutes Intravenous Every 24 hours 04/08/24 2037     04/09/24 0800  cefTRIAXone (ROCEPHIN) 2 g in sodium chloride  0.9 % 100 mL IVPB        2 g 200 mL/hr over 30 Minutes Intravenous Every 24 hours 04/08/24 1932     04/09/24 0600  metroNIDAZOLE (FLAGYL) IVPB 500 mg        500 mg 100 mL/hr over 60 Minutes Intravenous Every 12 hours 04/08/24 2039     04/08/24 1845  metroNIDAZOLE  (FLAGYL) IVPB 500 mg        500 mg 100 mL/hr over 60 Minutes Intravenous  Once 04/08/24 1836 04/09/24 0028   04/08/24 1530  cefTRIAXone (ROCEPHIN) 1 g in sodium chloride  0.9 % 100 mL IVPB        1 g 200 mL/hr over 30 Minutes Intravenous  Once 04/08/24 1525 04/08/24 1700   04/08/24 1530  azithromycin (ZITHROMAX) 500 mg in sodium chloride  0.9 % 250 mL IVPB        500 mg 250 mL/hr over 60 Minutes Intravenous  Once 04/08/24 1525 04/09/24 0027        Assessment/Plan POD 1 s/p Laparoscopic appendectomy by Dr. Lyndel on 04/11/24 for Acute ruptured appendicitis  - Intra-op findings: Perforated appendicitis, high riding appendix with abscess in Morrison's pouch  - ADAT - Cont abx, 5 days post op.  - Cont JP drain, SS (in Morrison's pouch) - Mobilize, PT - Okay to continue Imuran , Prograf  and prednisone  for history of renal transplant  - and is currently being treated for pneumonia   FEN - FLD, ADAT to Soft, IVF per [facility] VTE - SCDs, okay for chem ppx from a general surgery standpoint ID - Azithro/Rocephin/Flagyl/Bactrim. Okay to narrow from our standpoint.     LOS: 4 days    Ozell CHRISTELLA Shaper, [facility]  04/12/2024, 8:58 AM Please see Amion for pager number during day hours 7:00am-4:30pm

## 2024-04-12 NOTE — Progress Notes (Signed)
 Received voice message from patient today that she is the hospital at [facility] had surgery and is sick.  Patient is scheduled for surgery on 04-15-2024 @ [facility] by Dr Latisha.  Called and spoke w/ Duwaine , [place] scheduler for Dr Latisha informed her that is inpatient at [facility] dx w/ CAP and had Lap Appendectomy yesterday.  Duwaine stated she will let Dr Latisha aware , cancel patient surgery, and let patient know office will call her next week.  I told Duwaine I would call patient since she had called and left message here and let her know. Called and had to leave message thanked patient for letting us  know and told her I let office know that she is in the hospital and that they will be calling her sometime next week.

## 2024-04-12 NOTE — Progress Notes (Signed)
 PROGRESS NOTE    Kimberly [name]  FMW:[id number] DOB: Mar 18, 1956 DOA: 04/08/2024 PCP: Loreli Elsie [name], MD   Brief Narrative:  Kimberly [name] is a 68 y.o. female with medical history significant of T2DM,  HTN, HLD, hx of hyperthyroidism, hx of kidney transplant s/p DDKT 11/2020 who presented to ED with abdominal pain -imaging concerning for appendicitis w/ microperforation -hospitalist called for admission, general surgery, consult.  Assessment & Plan:   Principal Problem:   Acute appendicitis Active Problems:   CAP (community acquired pneumonia)   Acute renal failure superimposed on stage 3b chronic kidney disease (HCC)   History of kidney transplant   Diabetes mellitus type 2, insulin  dependent (HCC)   Essential (primary) hypertension   Hyperlipidemia  Severe sepsis with lactic acidosis, multifactorial, POA - Appendicitis, liver abscess, pneumonia as below  Acute appendicitis with microperforation Liver abscess versus cyst, POA -Criteria met with leukocytosis, tachypnea notable source with elevated lactic acid/AKI -Pneumonia unlikely given lack of symptoms but will continue treatment as below for coverage -Continue azithromycin, ceftriaxone, Flagyl, (on Bactrim three times per week at baseline) -Pending cultures will likely involve infectious disease versus IR, unclear if there are options for drainage or fluid analysis -General Surgery following, appreciate insight recommendations - Repeat imaging 10/19 notable for persistent findings of acute appendicitis with enlarging subcapsular fluid collection inferior to the liver.  Laparoscopic appendectomy 04/11/2024 tolerated well   Pneumonia, community-acquired, POA, resolving -Hx of immunosuppression secondary to renal transplant  -No symptoms or complaints at this time, continue to follow, broad-spectrum antibiotics completed -Patient completed 5-day course of antibiotics, follow clinically   Acute renal failure  superimposed on stage 3b chronic kidney disease (HCC), resolving - Baseline creatinine 1.4 -approaching baseline - Likely prerenal exacerbated by poor p.o. intake nausea vomiting hypotension and sepsis - Continue IV fluids as appropriate, advance diet as tolerated postoperatively   History of kidney transplant s/p DDKT 11/29/2020  Continue Prograf , prednisone , Imuran , Bactrim (three times per week)   Diabetes mellitus type 2, insulin  dependent (HCC) -Uncontrolled with hyperglycemia -Continue long acting insulin   -Continue sliding scale insulin , hypoglycemic protocol   Essential (primary) hypertension Medications held given initially hypotensive, as blood pressure improves restart home medications, currently on labetalol , holding home amlodipine   Hyperlipidemia Continue simvastatin , Zetia    DVT prophylaxis: SCDs Start: 04/08/24 2040 Code Status:   Code Status: Full Code Family Communication: Husband at bedside  Status is: Inpatient  Dispo: The patient is from: Home              Anticipated d/c is to: Home              Anticipated d/c date is: 72+ hours              Patient currently not medically stable for discharge  Consultants:  General surgery  Procedures:  None planned  Antimicrobials:  Azithromycin, ceftriaxone, Flagyl - Chronically on Bactrim 3 times weekly  Subjective: No acute issues or events overnight denies nausea vomiting diarrhea constipation headache fevers chills or chest pain.  Abdominal discomfort ongoing but markedly improved on current regimen  Objective: Vitals:   04/11/24 1715 04/11/24 1739 04/11/24 2125 04/12/24 0425  BP: (!) 140/65 (!) 157/68 (!) 159/57 (!) 150/68  Pulse: 73 73 77 74  Resp: 20 16 20 18   Temp:  98.1 F (36.7 C) 98.6 F (37 C) 98.5 F (36.9 C)  TempSrc:  Oral Oral Oral  SpO2: 93% 92% 93% 92%  Weight:  Height:        Intake/Output Summary (Last 24 hours) at 04/12/2024 0823 Last data filed at 04/12/2024 0600 Gross per  24 hour  Intake 748.41 ml  Output 40 ml  Net 708.41 ml   Filed Weights   04/08/24 1739 04/09/24 0437 04/11/24 1322  Weight: 66.2 kg 69.2 kg 69.2 kg    Examination:  General:  Pleasantly resting in bed, No acute distress. HEENT:  Normocephalic atraumatic.  Sclerae nonicteric, noninjected.  Extraocular movements intact bilaterally. Neck:  Without mass or deformity.  Trachea is midline. Lungs:  Clear to auscultate bilaterally without rhonchi, wheeze, or rales. Heart:  Regular rate and rhythm.  Without murmurs, rubs, or gallops. Abdomen:  Soft, moderately tender, PMI at right lower quadrant without guarding or rebound. Extremities: Without cyanosis, clubbing, edema, or obvious deformity. Skin:  Warm and dry, no erythema.  Data Reviewed: I have personally reviewed following labs and imaging studies  CBC: Recent Labs  Lab 04/08/24 1116 04/09/24 0712 04/10/24 0330 04/11/24 0319 04/12/24 0419  WBC 14.5* 14.5* 12.8* 13.3* 9.3  HGB 12.8 12.4 12.2 11.9* 11.9*  HCT 39.6 37.1 36.8 35.6* 37.2  MCV 93.0 91.2 92.9 92.5 93.2  PLT 135* 125* 126* 142* 154   Basic Metabolic Panel: Recent Labs  Lab 04/08/24 1116 04/09/24 0712 04/11/24 0319 04/12/24 0419  NA 130* 130* 133* 136  K 3.9 3.4* 3.5 4.5  CL 95* 97* 100 104  CO2 21* 20* 21* 21*  GLUCOSE 234* 162* 208* 254*  BUN 29* 38* 29* 31*  CREATININE 1.72* 1.59* 1.15* 1.14*  CALCIUM  9.7 9.6 9.8 9.8   GFR: Estimated Creatinine Clearance: 45.5 mL/min (A) (by C-G formula based on SCr of 1.14 mg/dL (H)). Liver Function Tests: Recent Labs  Lab 04/08/24 1116 04/09/24 0712  AST 19 23  ALT 14 14  ALKPHOS 87 99  BILITOT 1.4* 1.0  PROT 5.8* 5.6*  ALBUMIN 3.6 3.2*   Recent Labs  Lab 04/08/24 1116  LIPASE 15   No results for input(s): AMMONIA in the last 168 hours. Coagulation Profile: Recent Labs  Lab 04/08/24 1532  INR 1.2   Cardiac Enzymes: No results for input(s): CKTOTAL, CKMB, CKMBINDEX, TROPONINI in the  last 168 hours. BNP (last 3 results) No results for input(s): PROBNP in the last 8760 hours. HbA1C: No results for input(s): HGBA1C in the last 72 hours. CBG: Recent Labs  Lab 04/11/24 1137 04/11/24 1601 04/11/24 1749 04/11/24 2206 04/12/24 0736  GLUCAP 118* 146* 181* 263* 261*   Sepsis Labs: Recent Labs  Lab 04/08/24 2009 04/08/24 2016 04/09/24 0712  PROCALCITON  --  38.90 32.60  LATICACIDVEN 2.0*  --   --     Recent Results (from the past 240 hours)  Blood Culture (routine x 2)     Status: None (Preliminary result)   Collection Time: 04/08/24  3:32 PM   Specimen: BLOOD RIGHT ARM  Result Value Ref Range Status   Specimen Description   Final    BLOOD RIGHT ARM BOTTLES DRAWN AEROBIC AND ANAEROBIC Performed at [facility], [address], [place], [place] [address]    Special Requests   Final    Blood Culture adequate volume Performed at [facility], [address] [place], [place] [address]    Culture   Final    NO GROWTH 4 DAYS Performed at [facility], [address], [place], [place] [address]    Report Status PENDING  Incomplete  Resp panel  by RT-PCR (RSV, Flu A&B, Covid) Anterior Nasal Swab     Status: None   Collection Time: 04/09/24  1:20 AM   Specimen: Anterior Nasal Swab  Result Value Ref Range Status   SARS Coronavirus 2 by RT PCR NEGATIVE NEGATIVE Final    Comment: (NOTE) SARS-CoV-2 target nucleic acids are NOT DETECTED.  The SARS-CoV-2 RNA is generally detectable in upper respiratory specimens during the acute phase of infection. The lowest concentration of SARS-CoV-2 viral copies this assay can detect is 138 copies/mL. A negative result does not preclude SARS-Cov-2 infection and should not be used as the sole basis for treatment or other patient management decisions. A negative result may occur with  improper specimen collection/handling, submission of specimen other than nasopharyngeal swab,  presence of viral mutation(s) within the areas targeted by this assay, and inadequate number of viral copies(<138 copies/mL). A negative result must be combined with clinical observations, patient history, and epidemiological information. The expected result is Negative.  Fact Sheet for Patients:  BloggerCourse.com  Fact Sheet for Healthcare Providers:  SeriousBroker.it  This test is no t yet approved or cleared by the [place]  FDA and  has been authorized for detection and/or diagnosis of SARS-CoV-2 by FDA under an Emergency Use Authorization (EUA). This EUA will remain  in effect (meaning this test can be used) for the duration of the COVID-19 declaration under Section 564(b)(1) of the Act, 21 U.S.C.section 360bbb-3(b)(1), unless the authorization is terminated  or revoked sooner.       Influenza A by PCR NEGATIVE NEGATIVE Final   Influenza B by PCR NEGATIVE NEGATIVE Final    Comment: (NOTE) The Xpert Xpress SARS-CoV-2/FLU/RSV plus assay is intended as an aid in the diagnosis of influenza from Nasopharyngeal swab specimens and should not be used as a sole basis for treatment. Nasal washings and aspirates are unacceptable for Xpert Xpress SARS-CoV-2/FLU/RSV testing.  Fact Sheet for Patients: BloggerCourse.com  Fact Sheet for Healthcare Providers: SeriousBroker.it  This test is not yet approved or cleared by the [place]  FDA and has been authorized for detection and/or diagnosis of SARS-CoV-2 by FDA under an Emergency Use Authorization (EUA). This EUA will remain in effect (meaning this test can be used) for the duration of the COVID-19 declaration under Section 564(b)(1) of the Act, 21 U.S.C. section 360bbb-3(b)(1), unless the authorization is terminated or revoked.     Resp Syncytial Virus by PCR NEGATIVE NEGATIVE Final    Comment: (NOTE) Fact Sheet for  Patients: BloggerCourse.com  Fact Sheet for Healthcare Providers: SeriousBroker.it  This test is not yet approved or cleared by the [place]  FDA and has been authorized for detection and/or diagnosis of SARS-CoV-2 by FDA under an Emergency Use Authorization (EUA). This EUA will remain in effect (meaning this test can be used) for the duration of the COVID-19 declaration under Section 564(b)(1) of the Act, 21 U.S.C. section 360bbb-3(b)(1), unless the authorization is terminated or revoked.  Performed at [facility], [address], [place], [place] [address]       Radiology Studies:  ADDENDUM #1  EXAM: CT ABDOMEN AND PELVIS WITHOUT CONTRAST 04/08/2024 05:36:04 PM TECHNIQUE: CT of the abdomen and pelvis was performed without the administration of intravenous contrast. Multiplanar reformatted images are provided for review. Automated exposure control, iterative reconstruction, and/or weight-based adjustment of the mA/kV was utilized to reduce the radiation dose to as low as reasonably achievable. COMPARISON: Ultrasound of 02/11/2016. No prior CT. CLINICAL HISTORY: lower abdominal pain, prior renal transplant.  FINDINGS: LOWER CHEST: Tiny right pleural effusion. Right greater than left lung base airspace disease. Moderate cardiomegaly. Small hiatal hernia. Coronary artery calcification. LIVER: Hepatic cysts of up to 1.8 cm. Low density capsular-based right hepatic lobe lesions of up to 2.1 x 2.5 cm (series 26, image 2). Suspect subtle gas adjacent the right hepatic capsule on image 30/2, just cephalad to the appendiceal tip. GALLBLADDER AND BILE DUCTS: Gallbladder is unremarkable. No biliary ductal dilatation. SPLEEN: No acute abnormality. PANCREAS: Mild pancreatic atrophy. ADRENAL GLANDS: No acute abnormality. KIDNEYS, URETERS AND BLADDER: Bilateral native kidneys demonstrate moderate to marked atrophy. Upper pole right native  renal artery 1.1 cm fluid density lesion is likely a cyst and does not warrant specific imaging follow-up. Lower pole left native renal artery 1.9 cm low-density lesion is also likely a cyst and does not warrant imaging follow-up. Negative left native renal artery punctate stone versus renal vascular calcification. Atrophic right iliac fossa renal transplant with presumed vascular calcifications. Left iliac fossa renal transplant without hydronephrosis or surrounding fluid collection. No bladder calculi. No hydronephrosis of the native kidneys. . Urinary bladder is unremarkable. GI AND BOWEL: Apparent gastric body wall thickening is likely due to underdistention. Periampullary duodenal diverticulum. Scattered colonic diverticula. Normal terminal ileum. The appendix originates on image 51/2 with multiple appendicoliths within including on image 49/2. The appendix is dilated at up to 1.8 cm on image 45/2 with moderate surrounding edema. There is no bowel obstruction. PERITONEUM AND RETROPERITONEUM: No ascites. No free air. VASCULATURE: Aorta is normal in caliber. Advanced aortic atherosclerosis. LYMPH NODES: No lymphadenopathy. REPRODUCTIVE ORGANS: Suspected posterior uterine fundal partially calcified 11 mm fibroid. No adnexal mass. BONES AND SOFT TISSUES: Prominent thoracolumbar spondylosis. Eccentric right disc bulge at L3-L4 including on sagittal image 102/9. Mild pelvic floor laxity. No acute osseous abnormality. No focal soft tissue abnormality. IMPRESSION: 1. Findings highly suspicious for acute appendicitis with microperforation and extraluminal gas adjacent to the right hepatic lobe. 2. Capsular-based hypoattenuating liver lesions are indeterminate on this noncontrast CT but are suspicious for hepatic abscesses, given proximity to the inflammatory process. 3. Bibasilar airspace disease with trace right pleural effusion; although this may reflect atelectasis, infectionparticularly at the right lung baseis a  concern. 4. Probable small uterine fundal fibroid. 5. Large L3-L4 eccentric right disc bulge. 6. Cardiomegaly. Aortic atherosclerosis (ICD-10 I70.0). Coronary artery atherosclerosis. Case discussed with Dr rapp at 6:45 pm. Electronically signed by: Rockey Kilts MD 04/08/2024 06:52 PM EDT RP Workstation: [id number]   Result Date: 04/08/2024  ORIGINAL REPORT  EXAM: CT ABDOMEN AND PELVIS WITHOUT CONTRAST 04/08/2024 05:36:04 PM TECHNIQUE: CT of the abdomen and pelvis was performed without the administration of intravenous contrast. Multiplanar reformatted images are provided for review. Automated exposure control, iterative reconstruction, and/or weight-based adjustment of the mA/kV was utilized to reduce the radiation dose to as low as reasonably achievable. COMPARISON: Ultrasound of 02/11/2016. No prior CT. CLINICAL HISTORY: lower abdominal pain, prior renal transplant. FINDINGS: LOWER CHEST: Tiny right pleural effusion. Right greater than left lung base airspace disease. Moderate cardiomegaly. Small hiatal hernia. Coronary artery calcification. LIVER: Hepatic cysts of up to 1.8 cm. Low density capsular-based right hepatic lobe lesions of up to 2.1 x 2.5 cm (series 26, image 2). Suspect subtle gas adjacent the right hepatic capsule on image 30/2, just cephalad to the appendiceal tip. GALLBLADDER AND BILE DUCTS: Gallbladder is unremarkable. No biliary ductal dilatation. SPLEEN: No acute abnormality. PANCREAS: Mild pancreatic atrophy. ADRENAL GLANDS: No acute abnormality. KIDNEYS, URETERS AND BLADDER: Bilateral native kidneys  demonstrate moderate to marked atrophy. Upper pole right native renal artery 1.1 cm fluid density lesion is likely a cyst and does not warrant specific imaging follow-up. Lower pole left native renal artery 1.9 cm low-density lesion is also likely a cyst and does not warrant imaging follow-up. Negative left native renal artery punctate stone versus renal vascular calcification. Atrophic right  iliac fossa renal transplant with presumed vascular calcifications. Left iliac fossa renal transplant without hydronephrosis or surrounding fluid collection. No bladder calculi. No hydronephrosis of the native kidneys. . Urinary bladder is unremarkable. GI AND BOWEL: Apparent gastric body wall thickening is likely due to underdistention. Periampullary duodenal diverticulum. Scattered colonic diverticula. Normal terminal ileum. The appendix originates on image 51/2 with multiple appendicoliths within including on image 49/2. The appendix is dilated at up to 1.8 cm on image 45/2 with moderate surrounding edema. There is no bowel obstruction. PERITONEUM AND RETROPERITONEUM: No ascites. No free air. VASCULATURE: Aorta is normal in caliber. Advanced aortic atherosclerosis. LYMPH NODES: No lymphadenopathy. REPRODUCTIVE ORGANS: Suspected posterior uterine fundal partially calcified 11 mm fibroid. No adnexal mass. BONES AND SOFT TISSUES: Prominent thoracolumbar spondylosis. Eccentric right disc bulge at L3-L4 including on sagittal image 102/9. Mild pelvic floor laxity. No acute osseous abnormality. No focal soft tissue abnormality. IMPRESSION: 1. Findings highly suspicious for acute appendicitis with microperforation and extraluminal gas adjacent to the right hepatic lobe. 2. Capsular-based hypoattenuating liver lesions are indeterminate on this noncontrast CT but are suspicious for hepatic abscesses, given proximity to the inflammatory process. 3. Bibasilar airspace disease with trace right pleural effusion; although this may reflect atelectasis, infectionparticularly at the right lung baseis a concern. 4. Probable small uterine fundal fibroid. 5. Large L3-L4 eccentric right disc bulge. 6. Cardiomegaly. Aortic atherosclerosis (ICD-10 I70.0). Coronary artery atherosclerosis. A return call from the clinical service is pending. Electronically signed by: Rockey Kilts MD 04/08/2024 06:21 PM EDT RP Workstation: [id number]    DG Chest 2 View Result Date: 04/08/2024 EXAM: 2 VIEW(S) XRAY OF THE CHEST 04/08/2024 11:53:51 AM COMPARISON: 05/05/220 CLINICAL HISTORY: ? PNA. Per chart: Pt presents after being sent from PCP for dx of pneumonia with suspicion for aspiration pneumonia. Pt also has right lower quadrant pain they felt was suspicous for appendicitis. Initially had n/v but not today. FINDINGS: LUNGS AND PLEURA: Increased bibasilar opacities are noted concerning for worsening atelectasis or pneumonia. Small right pleural effusion is noted. No pulmonary edema. No pneumothorax. HEART AND MEDIASTINUM: Stable cardiomediastinal silhouette. BONES AND SOFT TISSUES: No acute osseous abnormality. IMPRESSION: 1. Increased bibasilar opacities, which may reflect worsening atelectasis or pneumonia. 2. Small right pleural effusion. Electronically signed by: Lynwood Seip MD 04/08/2024 12:23 PM EDT RP Workstation: [id number]   Scheduled Meds:  azaTHIOprine   25 mg Oral Daily   ezetimibe   10 mg Oral QHS   feeding supplement  1 Container Oral TID BM   insulin  aspart  0-9 Units Subcutaneous TID WC   insulin  glargine-yfgn  16 Units Subcutaneous QPM   labetalol   200 mg Oral BID   magnesium  oxide  400 mg Oral BID   pantoprazole  (PROTONIX ) IV  40 mg Intravenous Q24H   predniSONE   5 mg Oral Q breakfast   simvastatin   20 mg Oral QHS   sulfamethoxazole-trimethoprim  1 tablet Oral Once per day on Monday Wednesday Friday   tacrolimus   4 mg Oral BID   Continuous Infusions:  azithromycin Stopped (04/11/24 1956)   cefTRIAXone (ROCEPHIN)  IV Stopped (04/11/24 1900)   metronidazole Stopped (04/11/24 2233)  LOS: 4 days    Time spent:  Elsie JAYSON Montclair, DO [facility]  If 7PM-7AM, please contact night-coverage [email or web address]  04/12/2024, 8:23 AM

## 2024-04-13 ENCOUNTER — Encounter (HOSPITAL_COMMUNITY): Payer: Self-pay

## 2024-04-13 ENCOUNTER — Other Ambulatory Visit (HOSPITAL_COMMUNITY): Payer: Self-pay

## 2024-04-13 ENCOUNTER — Inpatient Hospital Stay (HOSPITAL_COMMUNITY)

## 2024-04-13 DIAGNOSIS — N179 Acute kidney failure, unspecified: Secondary | ICD-10-CM | POA: Diagnosis not present

## 2024-04-13 DIAGNOSIS — K353 Acute appendicitis with localized peritonitis, without perforation or gangrene: Secondary | ICD-10-CM | POA: Diagnosis not present

## 2024-04-13 DIAGNOSIS — Z940 Kidney transplant status: Secondary | ICD-10-CM | POA: Diagnosis not present

## 2024-04-13 DIAGNOSIS — J189 Pneumonia, unspecified organism: Secondary | ICD-10-CM | POA: Diagnosis not present

## 2024-04-13 LAB — CULTURE, BLOOD (ROUTINE X 2)
Culture: NO GROWTH
Special Requests: ADEQUATE

## 2024-04-13 LAB — SURGICAL PATHOLOGY

## 2024-04-13 LAB — CBC
HCT: 37.7 % (ref 36.0–46.0)
Hemoglobin: 12 g/dL (ref 12.0–15.0)
MCH: 30 pg (ref 26.0–34.0)
MCHC: 31.8 g/dL (ref 30.0–36.0)
MCV: 94.3 fL (ref 80.0–100.0)
Platelets: 211 K/uL (ref 150–400)
RBC: 4 MIL/uL (ref 3.87–5.11)
RDW: 13.2 % (ref 11.5–15.5)
WBC: 9.8 K/uL (ref 4.0–10.5)
nRBC: 0 % (ref 0.0–0.2)

## 2024-04-13 LAB — BASIC METABOLIC PANEL WITH GFR
Anion gap: 11 (ref 5–15)
BUN: 38 mg/dL — ABNORMAL HIGH (ref 8–23)
CO2: 22 mmol/L (ref 22–32)
Calcium: 10.1 mg/dL (ref 8.9–10.3)
Chloride: 107 mmol/L (ref 98–111)
Creatinine, Ser: 1.23 mg/dL — ABNORMAL HIGH (ref 0.44–1.00)
GFR, Estimated: 47 mL/min — ABNORMAL LOW (ref >60)
Glucose, Bld: 192 mg/dL — ABNORMAL HIGH (ref 70–99)
Potassium: 3.7 mmol/L (ref 3.5–5.1)
Sodium: 140 mmol/L (ref 135–145)

## 2024-04-13 LAB — GLUCOSE, CAPILLARY
Glucose-Capillary: 160 mg/dL — ABNORMAL HIGH (ref 70–99)
Glucose-Capillary: 131 mg/dL — ABNORMAL HIGH (ref 70–99)

## 2024-04-13 MED ORDER — CEFUROXIME AXETIL 500 MG PO TABS
500.0000 mg | ORAL_TABLET | Freq: Two times a day (BID) | ORAL | 0 refills | Status: AC
  Filled 2024-04-13: qty 8, 4d supply, fill #0

## 2024-04-13 MED ORDER — ENSURE MAX PROTEIN PO LIQD
11.0000 [oz_av] | Freq: Two times a day (BID) | ORAL | Status: DC
  Administered 2024-04-13: 11 [oz_av] via ORAL
  Filled 2024-04-13 (×2): qty 330

## 2024-04-13 MED ORDER — ONDANSETRON HCL 4 MG PO TABS
4.0000 mg | ORAL_TABLET | Freq: Three times a day (TID) | ORAL | 0 refills | Status: AC | PRN
  Filled 2024-04-13: qty 20, 7d supply, fill #0

## 2024-04-13 MED ORDER — METRONIDAZOLE 500 MG PO TABS
500.0000 mg | ORAL_TABLET | Freq: Three times a day (TID) | ORAL | 0 refills | Status: AC
  Filled 2024-04-13: qty 12, 4d supply, fill #0

## 2024-04-13 MED ORDER — HYDROCODONE-ACETAMINOPHEN 5-325 MG PO TABS
1.0000 | ORAL_TABLET | ORAL | 0 refills | Status: AC | PRN
  Filled 2024-04-13: qty 30, 3d supply, fill #0

## 2024-04-13 MED ORDER — ENSURE MAX PROTEIN PO LIQD: 11.0000 [oz_av] | Freq: Two times a day (BID) | ORAL | 0 refills | Status: AC

## 2024-04-13 NOTE — Discharge Instructions (Signed)
 [facility]  Please arrive at least 30 min before your appointment to complete your check in paperwork.  If you are unable to arrive 30 min prior to your appointment time we may have to cancel or reschedule you. LAPAROSCOPIC SURGERY: POST OP INSTRUCTIONS Always review your discharge instruction sheet given to you by the facility where your surgery was performed. IF YOU HAVE DISABILITY OR FAMILY LEAVE FORMS, YOU MUST BRING THEM TO THE OFFICE FOR PROCESSING.   DO NOT GIVE THEM TO YOUR DOCTOR.  PAIN CONTROL  First take acetaminophen  (Tylenol ) to control your pain after surgery.  Follow directions on package.  Taking acetaminophen  (Tylenol ) regularly after surgery will help to control your pain and lower the amount of prescription pain medication you may need.  You should not take more than 4,000 mg (4 grams) of acetaminophen  (Tylenol ) in 24 hours.  You should not take ibuprofen  (Advil ), aleve, motrin , naprosyn or other NSAIDS if you have a history of stomach ulcers or chronic kidney disease.  A prescription for pain medication may be given to you upon discharge.  Take your pain medication as prescribed, if you still have uncontrolled pain after taking acetaminophen  (Tylenol ) or ibuprofen  (Advil ). Use ice packs to help control pain. If you need a refill on your pain medication, please contact your pharmacy.  They will contact our office to request authorization. Prescriptions will not be filled after 5pm or on week-ends.  HOME MEDICATIONS Take your usually prescribed medications unless otherwise directed.  DIET You should follow a light diet the first few days after arrival home.  Be sure to include lots of fluids daily. Avoid fatty, fried foods.   CONSTIPATION It is common to experience some constipation after surgery and if you are taking pain medication.  Increasing fluid intake and taking a stool softener (such as Colace) will usually help or prevent this problem from  occurring.  A mild laxative (Milk of Magnesia or Miralax) should be taken according to package instructions if there are no bowel movements after 48 hours.  WOUND/INCISION CARE Most patients will experience some swelling and bruising in the area of the incisions.  Ice packs will help.  Swelling and bruising can take several days to resolve.  Unless discharge instructions indicate otherwise, follow guidelines below  STERI-STRIPS - you may remove your outer bandages 48 hours after surgery, and you may shower at that time.  You have steri-strips (small skin tapes) in place directly over the incision.  These strips should be left on the skin for 7-10 days.   DERMABOND/SKIN GLUE - you may shower in 24 hours.  The glue will flake off over the next 2-3 weeks. Any sutures or staples will be removed at the office during your follow-up visit.  ACTIVITIES You may resume regular (light) daily activities beginning the next day--such as daily self-care, walking, climbing stairs--gradually increasing activities as tolerated.  You may have sexual intercourse when it is comfortable.  Refrain from any heavy lifting or straining until approved by your doctor. You may drive when you are no longer taking prescription pain medication, you can comfortably wear a seatbelt, and you can safely maneuver your car and apply brakes.  FOLLOW-UP You should see your doctor in the office for a follow-up appointment approximately 2-3 weeks after your surgery.  You should have been given your post-op/follow-up appointment when your surgery was scheduled.  If you did not receive a post-op/follow-up appointment, make sure that you call for this appointment  within a day or two after you arrive home to insure a convenient appointment time.   WHEN TO CALL YOUR DOCTOR: Fever over 101.0 Inability to urinate Continued bleeding from incision. Increased pain, redness, or drainage from the incision. Increasing abdominal pain  The clinic  staff is available to answer your questions during regular business hours.  Please don't hesitate to call and ask to speak to one of the nurses for clinical concerns.  If you have a medical emergency, go to the nearest emergency room or call 911.  A surgeon from [facility] is always on call at the hospital. [address], [place], [place]  [address] ? [address] [address], [place], [place]   [address] [phone] ? [phone] ? FAX [phone]

## 2024-04-13 NOTE — Progress Notes (Signed)
 PT Cancellation Note  Patient Details Name: Kimberly [name] MRN: [id number] DOB: Feb 28, 1956   Cancelled Treatment:    Reason Eval/Treat Not Completed:  Attempted tx session-pt politely declined-awaiting transport for a test. Will check back as schedule allows.   Dannial SQUIBB, PT Acute Rehabilitation  Office: [phone]

## 2024-04-13 NOTE — Progress Notes (Signed)
 Discharge meds in a secure bag delivered to patient in room by this RN. Patient dressed for discharge to home- to lobby via w/c - home w/ husband

## 2024-04-13 NOTE — Progress Notes (Signed)
   04/13/24 1422  TOC Brief Assessment  Insurance and Status Reviewed  Patient has primary care physician Yes  Home environment has been reviewed home wit spouse  Prior level of function: independent  Prior/Current Home Services No current home services  Social Drivers of Health Review SDOH reviewed no interventions necessary  Readmission risk has been reviewed Yes  Transition of care needs no transition of care needs at this time

## 2024-04-13 NOTE — Discharge Summary (Signed)
 Physician Discharge Summary  Kimberly [name] FMW:[id number] DOB: 1955-08-09 DOA: 04/08/2024  PCP: Loreli Elsie [name], MD  Admit date: 04/08/2024 Discharge date: 04/13/2024  Admitted From: Home Disposition: Home  Recommendations for Outpatient Follow-up:  Follow up with PCP in 1-2 weeks Follow-up with general surgery as scheduled Continue routine follow-up with transplant team  Home Health: None Equipment/Devices: None  Discharge Condition: Stable CODE STATUS: Full Diet recommendation: As tolerated  Brief/Interim Summary: Kimberly [name] is a 68 y.o. female with medical history significant of T2DM,  HTN, HLD, hx of hyperthyroidism, hx of kidney transplant s/p DDKT 11/2020 who presented to ED with abdominal pain -imaging concerning for appendicitis w/ microperforation -hospitalist called for admission, general surgery, consult.  Patient admitted with severe sepsis elevated lactic acidosis in the setting of appendicitis with possible underlying liver abscess and questionable pneumonia, community-acquired versus HCAP.  Patient's abdominal pain and imaging continue to worsen, unfortunately patient was evaluated by general surgery who recommended laparoscopic appendectomy on 04/11/2024.  Patient tolerated this quite well.  Since procedure patient continues to improve, evaluated by PT OT but not requiring any further therapy per their discussion, patient now tolerating p.o. without difficulty, passing gas and having bowel movement and otherwise stable and agreeable for discharge home.  Follow-up outpatient with PCP and general surgery as discussed.  Discharge Diagnoses:  Principal Problem:   Acute appendicitis Active Problems:   CAP (community acquired pneumonia)   Acute renal failure superimposed on stage 3b chronic kidney disease (HCC)   History of kidney transplant   Diabetes mellitus type 2, insulin  dependent (HCC)   Essential (primary) hypertension   Hyperlipidemia  Severe  sepsis with lactic acidosis, multifactorial, POA - Appendicitis, liver abscess, pneumonia as below   Acute appendicitis with microperforation Liver abscess versus cyst, POA -Criteria met with leukocytosis, tachypnea notable source with elevated lactic acid/AKI -Pneumonia unlikely given lack of symptoms but will continue treatment as below for coverage -Continue azithromycin, ceftriaxone, Flagyl, (on Bactrim three times per week at baseline) -Pending cultures will likely involve infectious disease versus IR, unclear if there are options for drainage or fluid analysis -General Surgery following, appreciate insight recommendations -continue pain regimen and antibiotics for 5 days total postoperatively - Repeat imaging 10/19 notable for persistent findings of acute appendicitis with enlarging subcapsular fluid collection inferior to the liver.  Laparoscopic appendectomy 04/11/2024 tolerated well   Pneumonia, community-acquired, POA, resolving -Hx of immunosuppression secondary to renal transplant  -No symptoms or complaints at this time, continue to follow, broad-spectrum antibiotics completed -Patient completed 5-day course of antibiotics, follow clinically   Acute renal failure superimposed on stage 3b chronic kidney disease (HCC), resolving - Baseline creatinine 1.4 -approaching baseline - Likely prerenal exacerbated by poor p.o. intake nausea vomiting hypotension and sepsis - Continue IV fluids as appropriate, advance diet as tolerated postoperatively   History of kidney transplant s/p DDKT 11/29/2020  Continue Prograf , prednisone , Imuran , Bactrim (three times per week)   Diabetes mellitus type 2, insulin  dependent (HCC) -Uncontrolled with hyperglycemia -Continue long acting insulin   -Continue sliding scale insulin , hypoglycemic protocol   Essential (primary) hypertension Medications held given initially hypotensive, as blood pressure improves restart home medications, currently on  labetalol , holding home amlodipine    Hyperlipidemia Continue simvastatin , Zetia     Discharge Instructions  Discharge Instructions     Call MD for:  difficulty breathing, headache or visual disturbances   Complete by: As directed    Call MD for:  extreme fatigue   Complete by: As directed  Call MD for:  hives   Complete by: As directed    Call MD for:  persistant dizziness or light-headedness   Complete by: As directed    Call MD for:  persistant nausea and vomiting   Complete by: As directed    Call MD for:  redness, tenderness, or signs of infection (pain, swelling, redness, odor or green/yellow discharge around incision site)   Complete by: As directed    Call MD for:  severe uncontrolled pain   Complete by: As directed    Call MD for:  temperature >100.4   Complete by: As directed    Diet - low sodium heart healthy   Complete by: As directed    Discharge instructions   Complete by: As directed    Follow-up with general surgery as scheduled, if any worsening pain nausea vomiting or your incision site has drainage please contact your physician or report back to the ED for further evaluation and treatment.   Increase activity slowly   Complete by: As directed       Allergies as of 04/13/2024       Reactions   Sucroferric Oxyhydroxide    Other reaction(s): Flushing (Red Skin)   Other Hives   tape   Tape Hives   Phoslo  [calcium  Acetate] Rash        Medication List     STOP taking these medications    methocarbamol 500 MG tablet Commonly known as: ROBAXIN       TAKE these medications    acetaminophen  500 MG tablet Commonly known as: TYLENOL  Take by mouth.   amLODipine  10 MG tablet Commonly known as: NORVASC  Take 10 mg by mouth every evening.   amoxicillin  500 MG capsule Commonly known as: AMOXIL  Take 2,000 mg by mouth See admin instructions. Take 4 capsules (2000 mg) by mouth 1 hour prior to dental work   azaTHIOprine  50 MG tablet Commonly  known as: IMURAN  Take 1 tablet by mouth daily.   cefUROXime 500 MG tablet Commonly known as: CEFTIN Take 1 tablet (500 mg total) by mouth 2 (two) times daily with a meal.   Ensure Max Protein Liqd Take 330 mLs (11 oz total) by mouth 2 (two) times daily.   ezetimibe -simvastatin  10-20 MG tablet Commonly known as: VYTORIN  Take 1 tablet by mouth at bedtime.   hydrALAZINE  25 MG tablet Commonly known as: APRESOLINE  Take 25 mg by mouth daily.   HYDROcodone -acetaminophen  5-325 MG tablet Commonly known as: NORCO/VICODIN Take 1-2 tablets by mouth every 4 (four) hours as needed for moderate pain (pain score 4-6).   labetalol  200 MG tablet Commonly known as: NORMODYNE  Take 1 tablet (200 mg total) by mouth 2 (two) times daily.   magnesium  oxide 400 MG tablet Commonly known as: MAG-OX Take 2 tablets by mouth 2 (two) times daily.   metroNIDAZOLE 500 MG tablet Commonly known as: FLAGYL Take 1 tablet (500 mg total) by mouth 3 (three) times daily for 4 days.   NovoLOG  FlexPen 100 UNIT/ML FlexPen Generic drug: insulin  aspart Inject 5-7 Units into the skin 3 (three) times daily with meals. 7 units in the AM with breakfast, 7 units in the afternoon with lunch, 5 units in the PM with dinner   ondansetron  4 MG tablet Commonly known as: ZOFRAN  Take 1 tablet (4 mg total) by mouth every 8 (eight) hours as needed.   OneTouch Verio test strip Generic drug: glucose blood Check sugars up to 3 times a day Dx: Diabetes on  insulin  tx with labile sugars due to ESRD/dialysis E11.319   pantoprazole  40 MG tablet Commonly known as: PROTONIX  Take 40 mg by mouth daily.   polyethylene glycol 17 g packet Commonly known as: MIRALAX / GLYCOLAX Take 17 g by mouth daily as needed.   predniSONE  5 MG tablet Commonly known as: DELTASONE  Take 5 mg by mouth daily.   senna-docusate 8.6-50 MG tablet Commonly known as: Senokot-S Take 1 tablet by mouth at bedtime as needed.   sulfamethoxazole-trimethoprim  400-80 MG tablet Commonly known as: BACTRIM Take 1 tablet by mouth 3 (three) times a week.   tacrolimus  1 MG capsule Commonly known as: PROGRAF  Take 4 mg by mouth 2 (two) times daily.   Toujeo  Max SoloStar 300 UNIT/ML Solostar Pen Generic drug: insulin  glargine (2 Unit Dial ) Inject 16 Units into the skin every evening.        Follow-up Information     Stechschulte, Deward PARAS, MD Follow up on 04/26/2024.   Specialty: Surgery Why: 2:50pm. Please bring a copy of your photo ID, insurance card and arrive 30 minutes prior to your appointment for paperwork. Contact information: [address] [place] [address] [phone]                Allergies  Allergen Reactions   Sucroferric Oxyhydroxide     Other reaction(s): Flushing (Red Skin)   Other Hives    tape   Tape Hives   Phoslo  [Calcium  Acetate] Rash    Consultations: General surgery   Procedures/Studies:    Subjective: No acute issues/events overnight   Discharge Exam: Vitals:   04/13/24 0530 04/13/24 1400  BP: (!) 166/64 110/70  Pulse: 77 71  Resp: 20 20  Temp: 98.7 F (37.1 C) 98 F (36.7 C)  SpO2: 95% 98%   Vitals:   04/12/24 1338 04/12/24 2111 04/13/24 0530 04/13/24 1400  BP: (!) 131/58 132/62 (!) 166/64 110/70  Pulse: 76 77 77 71  Resp: 17  20 20   Temp: (!) 97.5 F (36.4 C) 98.4 F (36.9 C) 98.7 F (37.1 C) 98 F (36.7 C)  TempSrc: Oral Oral Oral Oral  SpO2: 95% 97% 95% 98%  Weight:      Height:        General: Pt is alert, awake, not in acute distress Cardiovascular: RRR, S1/S2 +, no rubs, no gallops Respiratory: CTA bilaterally, no wheezing, no rhonchi Abdominal: Soft, minimally tender - non-distended, bowel sounds + Extremities: no edema, no cyanosis    The results of significant diagnostics from this hospitalization (including imaging, microbiology, ancillary and laboratory) are listed below for reference.     Microbiology: Recent Results (from the past  240 hours)  Blood Culture (routine x 2)     Status: None   Collection Time: 04/08/24  3:32 PM   Specimen: BLOOD RIGHT ARM  Result Value Ref Range Status   Specimen Description   Final    BLOOD RIGHT ARM BOTTLES DRAWN AEROBIC AND ANAEROBIC Performed at [facility], [address] [place], [place] [address]    Special Requests   Final    Blood Culture adequate volume Performed at [facility], [address] [place], [place] [address]    Culture   Final    NO GROWTH 5 DAYS Performed at [facility], [address] [place], [place] [address]    Report Status 04/13/2024 FINAL  Final  Resp panel by RT-PCR (RSV, Flu A&B, Covid) Anterior Nasal  Swab     Status: None   Collection Time: 04/09/24  1:20 AM   Specimen: Anterior Nasal Swab  Result Value Ref Range Status   SARS Coronavirus 2 by RT PCR NEGATIVE NEGATIVE Final    Comment: (NOTE) SARS-CoV-2 target nucleic acids are NOT DETECTED.  The SARS-CoV-2 RNA is generally detectable in upper respiratory specimens during the acute phase of infection. The lowest concentration of SARS-CoV-2 viral copies this assay can detect is 138 copies/mL. A negative result does not preclude SARS-Cov-2 infection and should not be used as the sole basis for treatment or other patient management decisions. A negative result may occur with  improper specimen collection/handling, submission of specimen other than nasopharyngeal swab, presence of viral mutation(s) within the areas targeted by this assay, and inadequate number of viral copies(<138 copies/mL). A negative result must be combined with clinical observations, patient history, and epidemiological information. The expected result is Negative.  Fact Sheet for Patients:  BloggerCourse.com  Fact Sheet for Healthcare Providers:  SeriousBroker.it  This test is no t yet approved or cleared by the [place] FDA and  has been authorized for detection and/or diagnosis of SARS-CoV-2 by FDA under an Emergency Use Authorization (EUA). This EUA will remain  in effect (meaning this test can be used) for the duration of the COVID-19 declaration under Section 564(b)(1) of the Act, 21 U.S.C.section 360bbb-3(b)(1), unless the authorization is terminated  or revoked sooner.       Influenza A by PCR NEGATIVE NEGATIVE Final   Influenza B by PCR NEGATIVE NEGATIVE Final    Comment: (NOTE) The Xpert Xpress SARS-CoV-2/FLU/RSV plus assay is intended as an aid in the diagnosis of influenza from Nasopharyngeal swab specimens and should not be used as a sole basis for treatment. Nasal washings and aspirates are unacceptable for Xpert Xpress SARS-CoV-2/FLU/RSV testing.  Fact Sheet for Patients: BloggerCourse.com  Fact Sheet for Healthcare Providers: SeriousBroker.it  This test is not yet approved or cleared by the [place]  FDA and has been authorized for detection and/or diagnosis of SARS-CoV-2 by FDA under an Emergency Use Authorization (EUA). This EUA will remain in effect (meaning this test can be used) for the duration of the COVID-19 declaration under Section 564(b)(1) of the Act, 21 U.S.C. section 360bbb-3(b)(1), unless the authorization is terminated or revoked.     Resp Syncytial Virus by PCR NEGATIVE NEGATIVE Final    Comment: (NOTE) Fact Sheet for Patients: BloggerCourse.com  Fact Sheet for Healthcare Providers: SeriousBroker.it  This test is not yet approved or cleared by the [place]  FDA and has been authorized for detection and/or diagnosis of SARS-CoV-2 by FDA under an Emergency Use Authorization (EUA). This EUA will remain in effect (meaning this test can be used) for the duration of the COVID-19 declaration under Section 564(b)(1) of the Act, 21 U.S.C. section  360bbb-3(b)(1), unless the authorization is terminated or revoked.  Performed at [facility], [address] [place], [place] [address]      Labs: BNP (last 3 results) No results for input(s): BNP in the last 8760 hours. Basic Metabolic Panel: Recent Labs  Lab 04/08/24 1116 04/09/24 0712 04/11/24 0319 04/12/24 0419 04/13/24 0436  NA 130* 130* 133* 136 140  K 3.9 3.4* 3.5 4.5 3.7  CL 95* 97* 100 104 107  CO2 21* 20* 21* 21* 22  GLUCOSE 234* 162* 208* 254* 192*  BUN 29* 38* 29* 31* 38*  CREATININE 1.72* 1.59* 1.15* 1.14* 1.23*  CALCIUM  9.7 9.6 9.8  9.8 10.1   Liver Function Tests: Recent Labs  Lab 04/08/24 1116 04/09/24 0712  AST 19 23  ALT 14 14  ALKPHOS 87 99  BILITOT 1.4* 1.0  PROT 5.8* 5.6*  ALBUMIN 3.6 3.2*   Recent Labs  Lab 04/08/24 1116  LIPASE 15   No results for input(s): AMMONIA in the last 168 hours. CBC: Recent Labs  Lab 04/09/24 0712 04/10/24 0330 04/11/24 0319 04/12/24 0419 04/13/24 0436  WBC 14.5* 12.8* 13.3* 9.3 9.8  HGB 12.4 12.2 11.9* 11.9* 12.0  HCT 37.1 36.8 35.6* 37.2 37.7  MCV 91.2 92.9 92.5 93.2 94.3  PLT 125* 126* 142* 154 211   Cardiac Enzymes: No results for input(s): CKTOTAL, CKMB, CKMBINDEX, TROPONINI in the last 168 hours. BNP: Invalid input(s): POCBNP CBG: Recent Labs  Lab 04/12/24 1138 04/12/24 1612 04/12/24 2012 04/13/24 0749 04/13/24 1219  GLUCAP 374* 196* 274* 160* 131*   D-Dimer No results for input(s): DDIMER in the last 72 hours. Hgb A1c No results for input(s): HGBA1C in the last 72 hours. Lipid Profile No results for input(s): CHOL, HDL, LDLCALC, TRIG, CHOLHDL, LDLDIRECT in the last 72 hours. Thyroid  function studies No results for input(s): TSH, T4TOTAL, T3FREE, THYROIDAB in the last 72 hours.  Invalid input(s): FREET3 Anemia work up No results for input(s): VITAMINB12, FOLATE, FERRITIN, TIBC, IRON, RETICCTPCT in the last  72 hours. Urinalysis    Component Value Date/Time   COLORURINE YELLOW 04/08/2024 1341   APPEARANCEUR CLEAR 04/08/2024 1341   LABSPEC 1.017 04/08/2024 1341   PHURINE 5.0 04/08/2024 1341   GLUCOSEU NEGATIVE 04/08/2024 1341   HGBUR NEGATIVE 04/08/2024 1341   BILIRUBINUR NEGATIVE 04/08/2024 1341   KETONESUR NEGATIVE 04/08/2024 1341   PROTEINUR NEGATIVE 04/08/2024 1341   NITRITE NEGATIVE 04/08/2024 1341   LEUKOCYTESUR TRACE (A) 04/08/2024 1341   Sepsis Labs Recent Labs  Lab 04/10/24 0330 04/11/24 0319 04/12/24 0419 04/13/24 0436  WBC 12.8* 13.3* 9.3 9.8   Microbiology Recent Results (from the past 240 hours)  Blood Culture (routine x 2)     Status: None   Collection Time: 04/08/24  3:32 PM   Specimen: BLOOD RIGHT ARM  Result Value Ref Range Status   Specimen Description   Final    BLOOD RIGHT ARM BOTTLES DRAWN AEROBIC AND ANAEROBIC Performed at [facility], [address] [place], [place] [address]    Special Requests   Final    Blood Culture adequate volume Performed at [facility], [address] [place], [place] [address]    Culture   Final    NO GROWTH 5 DAYS Performed at [facility], [address], [place], [place] [address]    Report Status 04/13/2024 FINAL  Final  Resp panel by RT-PCR (RSV, Flu A&B, Covid) Anterior Nasal Swab     Status: None   Collection Time: 04/09/24  1:20 AM   Specimen: Anterior Nasal Swab  Result Value Ref Range Status   SARS Coronavirus 2 by RT PCR NEGATIVE NEGATIVE Final    Comment: (NOTE) SARS-CoV-2 target nucleic acids are NOT DETECTED.  The SARS-CoV-2 RNA is generally detectable in upper respiratory specimens during the acute phase of infection. The lowest concentration of SARS-CoV-2 viral copies this assay can detect is 138 copies/mL. A negative result does not preclude SARS-Cov-2 infection and should not be used as the sole basis for treatment or other patient management  decisions. A negative result may occur with  improper specimen collection/handling, submission of specimen other  than nasopharyngeal swab, presence of viral mutation(s) within the areas targeted by this assay, and inadequate number of viral copies(<138 copies/mL). A negative result must be combined with clinical observations, patient history, and epidemiological information. The expected result is Negative.  Fact Sheet for Patients:  BloggerCourse.com  Fact Sheet for Healthcare Providers:  SeriousBroker.it  This test is no t yet approved or cleared by the [place]  FDA and  has been authorized for detection and/or diagnosis of SARS-CoV-2 by FDA under an Emergency Use Authorization (EUA). This EUA will remain  in effect (meaning this test can be used) for the duration of the COVID-19 declaration under Section 564(b)(1) of the Act, 21 U.S.C.section 360bbb-3(b)(1), unless the authorization is terminated  or revoked sooner.       Influenza A by PCR NEGATIVE NEGATIVE Final   Influenza B by PCR NEGATIVE NEGATIVE Final    Comment: (NOTE) The Xpert Xpress SARS-CoV-2/FLU/RSV plus assay is intended as an aid in the diagnosis of influenza from Nasopharyngeal swab specimens and should not be used as a sole basis for treatment. Nasal washings and aspirates are unacceptable for Xpert Xpress SARS-CoV-2/FLU/RSV testing.  Fact Sheet for Patients: BloggerCourse.com  Fact Sheet for Healthcare Providers: SeriousBroker.it  This test is not yet approved or cleared by the [place]  FDA and has been authorized for detection and/or diagnosis of SARS-CoV-2 by FDA under an Emergency Use Authorization (EUA). This EUA will remain in effect (meaning this test can be used) for the duration of the COVID-19 declaration under Section 564(b)(1) of the Act, 21 U.S.C. section 360bbb-3(b)(1), unless the  authorization is terminated or revoked.     Resp Syncytial Virus by PCR NEGATIVE NEGATIVE Final    Comment: (NOTE) Fact Sheet for Patients: BloggerCourse.com  Fact Sheet for Healthcare Providers: SeriousBroker.it  This test is not yet approved or cleared by the [place]  FDA and has been authorized for detection and/or diagnosis of SARS-CoV-2 by FDA under an Emergency Use Authorization (EUA). This EUA will remain in effect (meaning this test can be used) for the duration of the COVID-19 declaration under Section 564(b)(1) of the Act, 21 U.S.C. section 360bbb-3(b)(1), unless the authorization is terminated or revoked.  Performed at [facility], [address] [place], [place] [address]      Time coordinating discharge: Over 30 minutes  SIGNED:   Elsie JAYSON Montclair, DO [facility] 04/13/2024, 4:27 PM Pager   If 7PM-7AM, please contact night-coverage [email or web address]

## 2024-04-13 NOTE — Progress Notes (Signed)
 2 Days Post-Op  Subjective: CC: Feels better. Sore around her incisions and drain. Pain well controlled. Tolerating soft without n/v. Passing flatus. BM yesterday. Voiding. Mobilizing with PT in the halls.   Afebrile. No tachycardia or hypotension. WBC wnl. Hgb stable. Cr overall stable.   Objective: Vital signs in last 24 hours: Temp:  [97.5 F (36.4 C)-98.7 F (37.1 C)] 98.7 F (37.1 C) (10/22 0530) Pulse Rate:  [76-77] 77 (10/22 0530) Resp:  [17-20] 20 (10/22 0530) BP: (131-166)/(58-64) 166/64 (10/22 0530) SpO2:  [95 %-97 %] 95 % (10/22 0530) Last BM Date : 04/11/24  Intake/Output from previous day: 10/21 0701 - 10/22 0700 In: 719.3 [P.O.:420; IV Piggyback:299.3] Out: 25 [Drains:25] Intake/Output this shift: No intake/output data recorded.  PE: Gen:  Alert, NAD, pleasant Abd: Soft, mild distension, appropriately tender around laparoscopic incisions, no rigidity or guarding and otherwise NT, +BS. Incisions with glue intact appears well and are without drainage, bleeding, or signs of infection. JP drain SS - 25cc/24 hours  Lab Results:  Recent Labs    04/12/24 0419 04/13/24 0436  WBC 9.3 9.8  HGB 11.9* 12.0  HCT 37.2 37.7  PLT 154 211   BMET Recent Labs    04/12/24 0419 04/13/24 0436  NA 136 140  K 4.5 3.7  CL 104 107  CO2 21* 22  GLUCOSE 254* 192*  BUN 31* 38*  CREATININE 1.14* 1.23*  CALCIUM  9.8 10.1   PT/INR No results for input(s): LABPROT, INR in the last 72 hours. CMP     Component Value Date/Time   NA 140 04/13/2024 0436   K 3.7 04/13/2024 0436   CL 107 04/13/2024 0436   CO2 22 04/13/2024 0436   GLUCOSE 192 (H) 04/13/2024 0436   BUN 38 (H) 04/13/2024 0436   CREATININE 1.23 (H) 04/13/2024 0436   CALCIUM  10.1 04/13/2024 0436   PROT 5.6 (L) 04/09/2024 0712   ALBUMIN 3.2 (L) 04/09/2024 0712   AST 23 04/09/2024 0712   ALT 14 04/09/2024 0712   ALKPHOS 99 04/09/2024 0712   BILITOT 1.0 04/09/2024 0712   GFRNONAA 47 (L) 04/13/2024 0436    GFRAA 5 (L) 10/27/2018 1255   Lipase     Component Value Date/Time   LIPASE 15 04/08/2024 1116    Studies/Results: No results found.   Anti-infectives: Anti-infectives (From admission, onward)    Start     Dose/Rate Route Frequency Ordered Stop   04/13/24 1000  cefTRIAXone (ROCEPHIN) 2 g in sodium chloride  0.9 % 100 mL IVPB        2 g 200 mL/hr over 30 Minutes Intravenous Every 24 hours 04/12/24 1524 04/17/24 0959   04/12/24 2200  metroNIDAZOLE (FLAGYL) IVPB 500 mg        500 mg 100 mL/hr over 60 Minutes Intravenous Every 12 hours 04/12/24 1524 04/17/24 0959   04/11/24 0900  sulfamethoxazole-trimethoprim (BACTRIM) 400-80 MG per tablet 1 tablet        1 tablet Oral Once per day on Monday Wednesday Friday 04/08/24 2052     04/09/24 1700  azithromycin (ZITHROMAX) 500 mg in sodium chloride  0.9 % 250 mL IVPB  Status:  Discontinued        500 mg 250 mL/hr over 60 Minutes Intravenous Every 24 hours 04/08/24 2037 04/12/24 1153   04/09/24 0800  cefTRIAXone (ROCEPHIN) 2 g in sodium chloride  0.9 % 100 mL IVPB  Status:  Discontinued        2 g 200 mL/hr over 30 Minutes Intravenous  Every 24 hours 04/08/24 1932 04/12/24 1153   04/09/24 0600  metroNIDAZOLE (FLAGYL) IVPB 500 mg  Status:  Discontinued        500 mg 100 mL/hr over 60 Minutes Intravenous Every 12 hours 04/08/24 2039 04/12/24 1200   04/08/24 1845  metroNIDAZOLE (FLAGYL) IVPB 500 mg        500 mg 100 mL/hr over 60 Minutes Intravenous  Once 04/08/24 1836 04/09/24 0028   04/08/24 1530  cefTRIAXone (ROCEPHIN) 1 g in sodium chloride  0.9 % 100 mL IVPB        1 g 200 mL/hr over 30 Minutes Intravenous  Once 04/08/24 1525 04/08/24 1700   04/08/24 1530  azithromycin (ZITHROMAX) 500 mg in sodium chloride  0.9 % 250 mL IVPB        500 mg 250 mL/hr over 60 Minutes Intravenous  Once 04/08/24 1525 04/09/24 0027      FINAL MICROSCOPIC DIAGNOSIS:   A. APPENDIX, APPENDECTOMY:  Marked acute appendicitis with marked acute fibrinopurulent  serositis   Assessment/Plan POD 2 s/p Laparoscopic appendectomy by Dr. Lyndel on 04/11/24 for Acute ruptured appendicitis  - Intra-op findings: Perforated appendicitis, high riding appendix with abscess in Morrison's pouch  - Okay for soft diet - Cont abx, 5 days post op.  - Cont JP drain, SS (in Morrison's pouch) - Mobilize, PT. No PT follow up recommended - Okay to continue Imuran , Prograf  and prednisone  for history of renal transplant  - Will check CT scan to evaluate liver abscess and make sure it is well drained. If this looks okay, she is okay for discharge from our standpoint as early as later today. Will ensure she has follow up. Discussed discharge instructions, restrictions and return/call back precautions. Also recommended she follow up with her nephrologist. Discussed with [facility]  FEN - Soft, IVF per [facility] VTE - SCDs, okay for chem ppx from a general surgery standpoint ID - Rocephin/Flagyl/Bactrim. Okay to narrow from our standpoint.     LOS: 5 days    Kimberly [name], [facility] 04/13/2024, 9:47 AM Please see Amion for pager number during day hours 7:00am-4:30pm

## 2024-04-15 ENCOUNTER — Ambulatory Visit (HOSPITAL_COMMUNITY): Admission: RE | Admit: 2024-04-15 | Source: Home / Self Care | Admitting: Obstetrics and Gynecology

## 2024-04-15 DIAGNOSIS — E1151 Type 2 diabetes mellitus with diabetic peripheral angiopathy without gangrene: Secondary | ICD-10-CM

## 2024-04-15 DIAGNOSIS — I10 Essential (primary) hypertension: Secondary | ICD-10-CM

## 2024-04-15 SURGERY — DILATATION & CURETTAGE/HYSTEROSCOPY WITH RESECTOCOPE
Anesthesia: Choice

## 2024-04-20 DIAGNOSIS — K3533 Acute appendicitis with perforation and localized peritonitis, with abscess: Secondary | ICD-10-CM | POA: Diagnosis not present

## 2024-04-20 DIAGNOSIS — Z794 Long term (current) use of insulin: Secondary | ICD-10-CM | POA: Diagnosis not present

## 2024-04-20 DIAGNOSIS — Z940 Kidney transplant status: Secondary | ICD-10-CM | POA: Diagnosis not present

## 2024-04-20 DIAGNOSIS — D849 Immunodeficiency, unspecified: Secondary | ICD-10-CM | POA: Diagnosis not present

## 2024-04-20 DIAGNOSIS — K750 Abscess of liver: Secondary | ICD-10-CM | POA: Diagnosis not present

## 2024-04-20 DIAGNOSIS — E1129 Type 2 diabetes mellitus with other diabetic kidney complication: Secondary | ICD-10-CM | POA: Diagnosis not present

## 2024-04-20 DIAGNOSIS — N179 Acute kidney failure, unspecified: Secondary | ICD-10-CM | POA: Diagnosis not present

## 2024-04-21 DIAGNOSIS — Z940 Kidney transplant status: Secondary | ICD-10-CM | POA: Diagnosis not present

## 2024-04-27 DIAGNOSIS — Z940 Kidney transplant status: Secondary | ICD-10-CM | POA: Diagnosis not present

## 2024-04-27 DIAGNOSIS — D849 Immunodeficiency, unspecified: Secondary | ICD-10-CM | POA: Diagnosis not present

## 2024-04-27 DIAGNOSIS — Z7952 Long term (current) use of systemic steroids: Secondary | ICD-10-CM | POA: Diagnosis not present

## 2024-04-27 DIAGNOSIS — Z23 Encounter for immunization: Secondary | ICD-10-CM | POA: Diagnosis not present

## 2024-04-27 DIAGNOSIS — Z4822 Encounter for aftercare following kidney transplant: Secondary | ICD-10-CM | POA: Diagnosis not present

## 2024-04-27 DIAGNOSIS — Z299 Encounter for prophylactic measures, unspecified: Secondary | ICD-10-CM | POA: Diagnosis not present

## 2024-04-27 DIAGNOSIS — E785 Hyperlipidemia, unspecified: Secondary | ICD-10-CM | POA: Diagnosis not present

## 2024-04-27 DIAGNOSIS — Z5181 Encounter for therapeutic drug level monitoring: Secondary | ICD-10-CM | POA: Diagnosis not present

## 2024-04-27 DIAGNOSIS — Z905 Acquired absence of kidney: Secondary | ICD-10-CM | POA: Diagnosis not present

## 2024-04-27 DIAGNOSIS — Z794 Long term (current) use of insulin: Secondary | ICD-10-CM | POA: Diagnosis not present

## 2024-04-27 DIAGNOSIS — B348 Other viral infections of unspecified site: Secondary | ICD-10-CM | POA: Diagnosis not present

## 2024-04-27 DIAGNOSIS — D84821 Immunodeficiency due to drugs: Secondary | ICD-10-CM | POA: Diagnosis not present

## 2024-04-27 DIAGNOSIS — K3589 Other acute appendicitis without perforation or gangrene: Secondary | ICD-10-CM | POA: Diagnosis not present

## 2024-04-27 DIAGNOSIS — I10 Essential (primary) hypertension: Secondary | ICD-10-CM | POA: Diagnosis not present

## 2024-04-27 DIAGNOSIS — Z79631 Long term (current) use of antimetabolite agent: Secondary | ICD-10-CM | POA: Diagnosis not present

## 2024-04-27 DIAGNOSIS — Z2989 Encounter for other specified prophylactic measures: Secondary | ICD-10-CM | POA: Diagnosis not present

## 2024-04-27 DIAGNOSIS — E1165 Type 2 diabetes mellitus with hyperglycemia: Secondary | ICD-10-CM | POA: Diagnosis not present

## 2024-04-27 DIAGNOSIS — Z9049 Acquired absence of other specified parts of digestive tract: Secondary | ICD-10-CM | POA: Diagnosis not present

## 2024-04-27 DIAGNOSIS — Z9089 Acquired absence of other organs: Secondary | ICD-10-CM | POA: Diagnosis not present

## 2024-04-27 DIAGNOSIS — T8611 Kidney transplant rejection: Secondary | ICD-10-CM | POA: Diagnosis not present

## 2024-04-27 DIAGNOSIS — Z79899 Other long term (current) drug therapy: Secondary | ICD-10-CM | POA: Diagnosis not present

## 2024-04-27 DIAGNOSIS — B338 Other specified viral diseases: Secondary | ICD-10-CM | POA: Diagnosis not present

## 2024-04-27 DIAGNOSIS — N186 End stage renal disease: Secondary | ICD-10-CM | POA: Diagnosis not present

## 2024-05-16 DIAGNOSIS — Z940 Kidney transplant status: Secondary | ICD-10-CM | POA: Diagnosis not present

## 2024-05-23 DIAGNOSIS — Z7984 Long term (current) use of oral hypoglycemic drugs: Secondary | ICD-10-CM | POA: Diagnosis not present

## 2024-05-23 DIAGNOSIS — Z940 Kidney transplant status: Secondary | ICD-10-CM | POA: Diagnosis not present

## 2024-05-23 DIAGNOSIS — M810 Age-related osteoporosis without current pathological fracture: Secondary | ICD-10-CM | POA: Diagnosis not present

## 2024-05-23 DIAGNOSIS — Z79899 Other long term (current) drug therapy: Secondary | ICD-10-CM | POA: Diagnosis not present

## 2024-05-23 DIAGNOSIS — I129 Hypertensive chronic kidney disease with stage 1 through stage 4 chronic kidney disease, or unspecified chronic kidney disease: Secondary | ICD-10-CM | POA: Diagnosis not present

## 2024-05-23 DIAGNOSIS — Z794 Long term (current) use of insulin: Secondary | ICD-10-CM | POA: Diagnosis not present

## 2024-05-23 DIAGNOSIS — E119 Type 2 diabetes mellitus without complications: Secondary | ICD-10-CM | POA: Diagnosis not present
